# Patient Record
Sex: Female | Born: 1935 | Race: White | Hispanic: No | Smoking: Never smoker
Health system: Southern US, Community
[De-identification: ages and names within clinical notes are randomized; demographics above are authoritative.]

## PROBLEM LIST (undated history)

## (undated) DIAGNOSIS — L2089 Other atopic dermatitis: Secondary | ICD-10-CM

## (undated) DIAGNOSIS — R269 Unspecified abnormalities of gait and mobility: Secondary | ICD-10-CM

## (undated) DIAGNOSIS — E039 Hypothyroidism, unspecified: Secondary | ICD-10-CM

## (undated) DIAGNOSIS — R413 Other amnesia: Secondary | ICD-10-CM

## (undated) DIAGNOSIS — G253 Myoclonus: Secondary | ICD-10-CM

## (undated) DIAGNOSIS — S91009A Unspecified open wound, unspecified ankle, initial encounter: Secondary | ICD-10-CM

## (undated) DIAGNOSIS — N390 Urinary tract infection, site not specified: Secondary | ICD-10-CM

## (undated) DIAGNOSIS — E669 Obesity, unspecified: Secondary | ICD-10-CM

## (undated) DIAGNOSIS — Z9181 History of falling: Secondary | ICD-10-CM

## (undated) DIAGNOSIS — R32 Unspecified urinary incontinence: Secondary | ICD-10-CM

## (undated) DIAGNOSIS — K5900 Constipation, unspecified: Secondary | ICD-10-CM

## (undated) DIAGNOSIS — I8310 Varicose veins of unspecified lower extremity with inflammation: Secondary | ICD-10-CM

## (undated) DIAGNOSIS — S329XXA Fracture of unspecified parts of lumbosacral spine and pelvis, initial encounter for closed fracture: Secondary | ICD-10-CM

## (undated) DIAGNOSIS — E875 Hyperkalemia: Secondary | ICD-10-CM

## (undated) DIAGNOSIS — G608 Other hereditary and idiopathic neuropathies: Secondary | ICD-10-CM

## (undated) DIAGNOSIS — I10 Essential (primary) hypertension: Secondary | ICD-10-CM

## (undated) DIAGNOSIS — S81809A Unspecified open wound, unspecified lower leg, initial encounter: Secondary | ICD-10-CM

## (undated) DIAGNOSIS — N189 Chronic kidney disease, unspecified: Secondary | ICD-10-CM

## (undated) DIAGNOSIS — F070 Personality change due to known physiological condition: Secondary | ICD-10-CM

## (undated) DIAGNOSIS — S23101A Dislocation of unspecified thoracic vertebra, initial encounter: Secondary | ICD-10-CM

## (undated) DIAGNOSIS — J209 Acute bronchitis, unspecified: Secondary | ICD-10-CM

## (undated) DIAGNOSIS — R319 Hematuria, unspecified: Secondary | ICD-10-CM

## (undated) DIAGNOSIS — M26629 Arthralgia of temporomandibular joint, unspecified side: Secondary | ICD-10-CM

## (undated) DIAGNOSIS — J309 Allergic rhinitis, unspecified: Secondary | ICD-10-CM

## (undated) DIAGNOSIS — D649 Anemia, unspecified: Secondary | ICD-10-CM

## (undated) DIAGNOSIS — L919 Hypertrophic disorder of the skin, unspecified: Secondary | ICD-10-CM

## (undated) DIAGNOSIS — G5600 Carpal tunnel syndrome, unspecified upper limb: Secondary | ICD-10-CM

## (undated) DIAGNOSIS — L821 Other seborrheic keratosis: Secondary | ICD-10-CM

## (undated) DIAGNOSIS — I4891 Unspecified atrial fibrillation: Secondary | ICD-10-CM

## (undated) DIAGNOSIS — IMO0002 Reserved for concepts with insufficient information to code with codable children: Secondary | ICD-10-CM

## (undated) DIAGNOSIS — L299 Pruritus, unspecified: Secondary | ICD-10-CM

## (undated) DIAGNOSIS — H3530 Unspecified macular degeneration: Secondary | ICD-10-CM

## (undated) DIAGNOSIS — S32009A Unspecified fracture of unspecified lumbar vertebra, initial encounter for closed fracture: Secondary | ICD-10-CM

## (undated) DIAGNOSIS — S81009A Unspecified open wound, unspecified knee, initial encounter: Secondary | ICD-10-CM

## (undated) DIAGNOSIS — E785 Hyperlipidemia, unspecified: Secondary | ICD-10-CM

## (undated) DIAGNOSIS — G609 Hereditary and idiopathic neuropathy, unspecified: Secondary | ICD-10-CM

## (undated) DIAGNOSIS — G4700 Insomnia, unspecified: Secondary | ICD-10-CM

## (undated) DIAGNOSIS — E119 Type 2 diabetes mellitus without complications: Secondary | ICD-10-CM

## (undated) DIAGNOSIS — M419 Scoliosis, unspecified: Secondary | ICD-10-CM

## (undated) DIAGNOSIS — E1165 Type 2 diabetes mellitus with hyperglycemia: Secondary | ICD-10-CM

## (undated) DIAGNOSIS — R079 Chest pain, unspecified: Secondary | ICD-10-CM

## (undated) DIAGNOSIS — E559 Vitamin D deficiency, unspecified: Secondary | ICD-10-CM

## (undated) DIAGNOSIS — M129 Arthropathy, unspecified: Secondary | ICD-10-CM

## (undated) DIAGNOSIS — M6530 Trigger finger, unspecified finger: Secondary | ICD-10-CM

## (undated) DIAGNOSIS — H409 Unspecified glaucoma: Secondary | ICD-10-CM

## (undated) DIAGNOSIS — L909 Atrophic disorder of skin, unspecified: Secondary | ICD-10-CM

## (undated) DIAGNOSIS — M6200 Separation of muscle (nontraumatic), unspecified site: Secondary | ICD-10-CM

## (undated) DIAGNOSIS — L309 Dermatitis, unspecified: Secondary | ICD-10-CM

## (undated) DIAGNOSIS — Z9289 Personal history of other medical treatment: Secondary | ICD-10-CM

## (undated) HISTORY — DX: Acute bronchitis, unspecified: J20.9

## (undated) HISTORY — PX: RIGHT OOPHORECTOMY: SHX2359

## (undated) HISTORY — DX: Other disorders of calcium metabolism: E83.59

## (undated) HISTORY — PX: BIOPSY BREAST: PRO8

## (undated) HISTORY — DX: Unspecified urinary incontinence: R32

## (undated) HISTORY — DX: Carpal tunnel syndrome, unspecified upper limb: G56.00

## (undated) HISTORY — DX: Essential (primary) hypertension: I10

## (undated) HISTORY — DX: Vitamin D deficiency, unspecified: E55.9

## (undated) HISTORY — PX: LUMBAR SPINE SURGERY: SHX701

## (undated) HISTORY — DX: Personal history of other medical treatment: Z92.89

## (undated) HISTORY — DX: Insomnia, unspecified: G47.00

## (undated) HISTORY — DX: Dislocation of unspecified thoracic vertebra, initial encounter: S23.101A

## (undated) HISTORY — DX: Myoclonus: G25.3

## (undated) HISTORY — DX: Other atopic dermatitis: L20.89

## (undated) HISTORY — DX: Hypothyroidism, unspecified: E03.9

## (undated) HISTORY — DX: Hyperlipidemia, unspecified: E78.5

## (undated) HISTORY — DX: Chronic kidney disease, unspecified: N18.9

## (undated) HISTORY — DX: Chest pain, unspecified: R07.9

## (undated) HISTORY — DX: Urinary tract infection, site not specified: N39.0

## (undated) HISTORY — DX: Fracture of unspecified parts of lumbosacral spine and pelvis, initial encounter for closed fracture: S32.9XXA

## (undated) HISTORY — PX: LASER LAPAROSCOPY: SHX1952

## (undated) HISTORY — DX: Scoliosis, unspecified: M41.9

## (undated) HISTORY — DX: Hereditary and idiopathic neuropathy, unspecified: G60.9

## (undated) HISTORY — DX: Reserved for concepts with insufficient information to code with codable children: IMO0002

## (undated) HISTORY — DX: Allergic rhinitis, unspecified: J30.9

## (undated) HISTORY — DX: Atrophic disorder of skin, unspecified: L90.9

## (undated) HISTORY — DX: Other seborrheic keratosis: L82.1

## (undated) HISTORY — DX: Type 2 diabetes mellitus with hyperglycemia: E11.65

## (undated) HISTORY — DX: Varicose veins of unspecified lower extremity with inflammation: I83.10

## (undated) HISTORY — DX: Trigger finger, unspecified finger: M65.30

## (undated) HISTORY — DX: Separation of muscle (nontraumatic), unspecified site: M62.00

## (undated) HISTORY — DX: Unspecified fracture of unspecified lumbar vertebra, initial encounter for closed fracture: S32.009A

## (undated) HISTORY — PX: KNEE ARTHROSCOPY: SUR90

## (undated) HISTORY — PX: CORONARY ARTERY BYPASS GRAFT: SHX141

## (undated) HISTORY — DX: Arthralgia of temporomandibular joint, unspecified side: M26.629

## (undated) HISTORY — DX: Hyperkalemia: E87.5

## (undated) HISTORY — DX: Unspecified glaucoma: H40.9

## (undated) HISTORY — DX: Pruritus, unspecified: L29.9

## (undated) HISTORY — DX: Dermatitis, unspecified: L30.9

## (undated) HISTORY — DX: Arthropathy, unspecified: M12.9

## (undated) HISTORY — DX: Hematuria, unspecified: R31.9

## (undated) HISTORY — DX: Unspecified open wound, unspecified knee, initial encounter: S81.009A

## (undated) HISTORY — DX: Unspecified atrial fibrillation: I48.91

## (undated) HISTORY — DX: Unspecified open wound, unspecified lower leg, initial encounter: S81.809A

## (undated) HISTORY — DX: Unspecified open wound, unspecified ankle, initial encounter: S91.009A

## (undated) HISTORY — DX: History of falling: Z91.81

## (undated) HISTORY — DX: Obesity, unspecified: E66.9

## (undated) HISTORY — DX: Constipation, unspecified: K59.00

## (undated) HISTORY — PX: ABDOMINAL HYSTERECTOMY: SHX81

## (undated) HISTORY — DX: Type 2 diabetes mellitus without complications: E11.9

## (undated) HISTORY — DX: Anemia, unspecified: D64.9

## (undated) HISTORY — DX: Other amnesia: R41.3

## (undated) HISTORY — DX: Unspecified abnormalities of gait and mobility: R26.9

## (undated) HISTORY — DX: Hypertrophic disorder of the skin, unspecified: L91.9

## (undated) HISTORY — DX: Unspecified macular degeneration: H35.30

## (undated) HISTORY — DX: Other hereditary and idiopathic neuropathies: G60.8

## (undated) HISTORY — DX: Personality change due to known physiological condition: F07.0

---

## 1997-03-27 ENCOUNTER — Ambulatory Visit (HOSPITAL_COMMUNITY): Admission: RE | Admit: 1997-03-27 | Discharge: 1997-03-27 | Payer: Self-pay | Admitting: Internal Medicine

## 1997-05-25 ENCOUNTER — Encounter (HOSPITAL_COMMUNITY): Admission: RE | Admit: 1997-05-25 | Discharge: 1997-08-23 | Payer: Self-pay | Admitting: Cardiology

## 1997-07-26 ENCOUNTER — Encounter: Admission: RE | Admit: 1997-07-26 | Discharge: 1997-07-26 | Payer: Self-pay | Admitting: Internal Medicine

## 1998-05-04 ENCOUNTER — Ambulatory Visit (HOSPITAL_COMMUNITY): Admission: RE | Admit: 1998-05-04 | Discharge: 1998-05-04 | Payer: Self-pay | Admitting: Internal Medicine

## 1998-05-04 ENCOUNTER — Encounter: Payer: Self-pay | Admitting: Internal Medicine

## 1998-05-08 ENCOUNTER — Encounter: Payer: Self-pay | Admitting: Internal Medicine

## 1998-05-08 ENCOUNTER — Ambulatory Visit (HOSPITAL_COMMUNITY): Admission: RE | Admit: 1998-05-08 | Discharge: 1998-05-08 | Payer: Self-pay | Admitting: Internal Medicine

## 1998-05-17 ENCOUNTER — Encounter: Payer: Self-pay | Admitting: Internal Medicine

## 1998-05-17 ENCOUNTER — Ambulatory Visit (HOSPITAL_COMMUNITY): Admission: RE | Admit: 1998-05-17 | Discharge: 1998-05-17 | Payer: Self-pay | Admitting: Internal Medicine

## 1998-11-27 ENCOUNTER — Encounter: Payer: Self-pay | Admitting: Internal Medicine

## 1998-11-27 ENCOUNTER — Ambulatory Visit (HOSPITAL_COMMUNITY): Admission: RE | Admit: 1998-11-27 | Discharge: 1998-11-27 | Payer: Self-pay | Admitting: Internal Medicine

## 1998-12-10 ENCOUNTER — Encounter (HOSPITAL_COMMUNITY): Admission: RE | Admit: 1998-12-10 | Discharge: 1999-03-10 | Payer: Self-pay | Admitting: Cardiology

## 1999-03-11 ENCOUNTER — Encounter (HOSPITAL_COMMUNITY): Admission: RE | Admit: 1999-03-11 | Discharge: 1999-06-09 | Payer: Self-pay | Admitting: Cardiology

## 1999-06-10 ENCOUNTER — Encounter (HOSPITAL_COMMUNITY): Admission: RE | Admit: 1999-06-10 | Discharge: 1999-09-08 | Payer: Self-pay | Admitting: Cardiology

## 1999-12-13 ENCOUNTER — Encounter: Admission: RE | Admit: 1999-12-13 | Discharge: 2000-03-12 | Payer: Self-pay | Admitting: Family Medicine

## 1999-12-20 ENCOUNTER — Other Ambulatory Visit: Admission: RE | Admit: 1999-12-20 | Discharge: 1999-12-20 | Payer: Self-pay | Admitting: Internal Medicine

## 2000-02-13 ENCOUNTER — Ambulatory Visit (HOSPITAL_COMMUNITY): Admission: RE | Admit: 2000-02-13 | Discharge: 2000-02-13 | Payer: Self-pay | Admitting: Family Medicine

## 2000-02-13 ENCOUNTER — Encounter: Payer: Self-pay | Admitting: Family Medicine

## 2000-02-14 ENCOUNTER — Encounter: Admission: RE | Admit: 2000-02-14 | Discharge: 2000-02-14 | Payer: Self-pay | Admitting: Diagnostic Radiology

## 2000-02-14 ENCOUNTER — Encounter: Payer: Self-pay | Admitting: Diagnostic Radiology

## 2000-02-17 ENCOUNTER — Ambulatory Visit (HOSPITAL_COMMUNITY): Admission: RE | Admit: 2000-02-17 | Discharge: 2000-02-17 | Payer: Self-pay | Admitting: Family Medicine

## 2000-02-17 ENCOUNTER — Encounter: Payer: Self-pay | Admitting: Family Medicine

## 2000-03-12 ENCOUNTER — Encounter: Payer: Self-pay | Admitting: Family Medicine

## 2000-03-12 ENCOUNTER — Ambulatory Visit (HOSPITAL_COMMUNITY): Admission: RE | Admit: 2000-03-12 | Discharge: 2000-03-12 | Payer: Self-pay | Admitting: Family Medicine

## 2000-03-26 ENCOUNTER — Encounter: Payer: Self-pay | Admitting: Family Medicine

## 2000-03-26 ENCOUNTER — Ambulatory Visit (HOSPITAL_COMMUNITY): Admission: RE | Admit: 2000-03-26 | Discharge: 2000-03-26 | Payer: Self-pay | Admitting: Family Medicine

## 2000-04-08 ENCOUNTER — Ambulatory Visit (HOSPITAL_COMMUNITY): Admission: RE | Admit: 2000-04-08 | Discharge: 2000-04-08 | Payer: Self-pay | Admitting: Family Medicine

## 2000-04-08 ENCOUNTER — Encounter: Payer: Self-pay | Admitting: Family Medicine

## 2000-04-22 ENCOUNTER — Ambulatory Visit (HOSPITAL_COMMUNITY): Admission: RE | Admit: 2000-04-22 | Discharge: 2000-04-22 | Payer: Self-pay | Admitting: Internal Medicine

## 2000-04-22 ENCOUNTER — Encounter: Payer: Self-pay | Admitting: Internal Medicine

## 2000-04-28 ENCOUNTER — Encounter: Admission: RE | Admit: 2000-04-28 | Discharge: 2000-04-28 | Payer: Self-pay | Admitting: Internal Medicine

## 2000-04-28 ENCOUNTER — Encounter: Payer: Self-pay | Admitting: Internal Medicine

## 2000-06-03 ENCOUNTER — Encounter: Admission: RE | Admit: 2000-06-03 | Discharge: 2000-06-30 | Payer: Self-pay | Admitting: Family Medicine

## 2000-07-07 ENCOUNTER — Encounter: Payer: Self-pay | Admitting: Family Medicine

## 2000-07-07 ENCOUNTER — Ambulatory Visit (HOSPITAL_COMMUNITY): Admission: RE | Admit: 2000-07-07 | Discharge: 2000-07-07 | Payer: Self-pay | Admitting: Family Medicine

## 2000-07-07 ENCOUNTER — Encounter: Admission: RE | Admit: 2000-07-07 | Discharge: 2000-07-07 | Payer: Self-pay | Admitting: Family Medicine

## 2000-07-22 ENCOUNTER — Ambulatory Visit (HOSPITAL_COMMUNITY): Admission: RE | Admit: 2000-07-22 | Discharge: 2000-07-22 | Payer: Self-pay | Admitting: Family Medicine

## 2000-07-22 ENCOUNTER — Encounter: Payer: Self-pay | Admitting: Family Medicine

## 2000-07-22 ENCOUNTER — Encounter: Admission: RE | Admit: 2000-07-22 | Discharge: 2000-07-22 | Payer: Self-pay | Admitting: Family Medicine

## 2000-08-07 ENCOUNTER — Ambulatory Visit (HOSPITAL_COMMUNITY): Admission: RE | Admit: 2000-08-07 | Discharge: 2000-08-07 | Payer: Self-pay | Admitting: Family Medicine

## 2000-08-07 ENCOUNTER — Encounter: Admission: RE | Admit: 2000-08-07 | Discharge: 2000-08-07 | Payer: Self-pay | Admitting: Family Medicine

## 2000-08-07 ENCOUNTER — Encounter: Payer: Self-pay | Admitting: Family Medicine

## 2000-09-18 ENCOUNTER — Encounter: Admission: RE | Admit: 2000-09-18 | Discharge: 2000-09-18 | Payer: Self-pay | Admitting: Orthopedic Surgery

## 2000-09-18 ENCOUNTER — Encounter: Payer: Self-pay | Admitting: Orthopedic Surgery

## 2000-11-20 ENCOUNTER — Encounter: Admission: RE | Admit: 2000-11-20 | Discharge: 2000-11-20 | Payer: Self-pay | Admitting: *Deleted

## 2000-11-20 ENCOUNTER — Encounter: Payer: Self-pay | Admitting: *Deleted

## 2000-11-23 ENCOUNTER — Ambulatory Visit (HOSPITAL_BASED_OUTPATIENT_CLINIC_OR_DEPARTMENT_OTHER): Admission: RE | Admit: 2000-11-23 | Discharge: 2000-11-23 | Payer: Self-pay | Admitting: Orthopedic Surgery

## 2001-03-11 ENCOUNTER — Encounter: Payer: Self-pay | Admitting: Neurosurgery

## 2001-03-15 ENCOUNTER — Inpatient Hospital Stay (HOSPITAL_COMMUNITY): Admission: RE | Admit: 2001-03-15 | Discharge: 2001-03-17 | Payer: Self-pay | Admitting: Neurosurgery

## 2001-03-15 ENCOUNTER — Encounter: Payer: Self-pay | Admitting: Neurosurgery

## 2001-06-01 ENCOUNTER — Ambulatory Visit (HOSPITAL_COMMUNITY): Admission: RE | Admit: 2001-06-01 | Discharge: 2001-06-01 | Payer: Self-pay | Admitting: Internal Medicine

## 2001-06-01 ENCOUNTER — Encounter: Payer: Self-pay | Admitting: Internal Medicine

## 2001-06-07 ENCOUNTER — Encounter: Admission: RE | Admit: 2001-06-07 | Discharge: 2001-08-04 | Payer: Self-pay | Admitting: Neurosurgery

## 2002-06-08 ENCOUNTER — Encounter: Payer: Self-pay | Admitting: Internal Medicine

## 2002-06-08 ENCOUNTER — Ambulatory Visit (HOSPITAL_COMMUNITY): Admission: RE | Admit: 2002-06-08 | Discharge: 2002-06-08 | Payer: Self-pay | Admitting: Internal Medicine

## 2003-06-06 ENCOUNTER — Ambulatory Visit (HOSPITAL_COMMUNITY): Admission: RE | Admit: 2003-06-06 | Discharge: 2003-06-06 | Payer: Self-pay | Admitting: Internal Medicine

## 2003-12-05 ENCOUNTER — Encounter: Admission: RE | Admit: 2003-12-05 | Discharge: 2003-12-05 | Payer: Self-pay | Admitting: Orthopedic Surgery

## 2003-12-06 ENCOUNTER — Encounter (INDEPENDENT_AMBULATORY_CARE_PROVIDER_SITE_OTHER): Payer: Self-pay | Admitting: Specialist

## 2003-12-06 ENCOUNTER — Ambulatory Visit (HOSPITAL_BASED_OUTPATIENT_CLINIC_OR_DEPARTMENT_OTHER): Admission: RE | Admit: 2003-12-06 | Discharge: 2003-12-06 | Payer: Self-pay | Admitting: Orthopedic Surgery

## 2003-12-06 ENCOUNTER — Ambulatory Visit (HOSPITAL_COMMUNITY): Admission: RE | Admit: 2003-12-06 | Discharge: 2003-12-06 | Payer: Self-pay | Admitting: Orthopedic Surgery

## 2004-06-10 ENCOUNTER — Ambulatory Visit (HOSPITAL_COMMUNITY): Admission: RE | Admit: 2004-06-10 | Discharge: 2004-06-10 | Payer: Self-pay | Admitting: Internal Medicine

## 2004-06-19 ENCOUNTER — Ambulatory Visit (HOSPITAL_COMMUNITY): Admission: RE | Admit: 2004-06-19 | Discharge: 2004-06-19 | Payer: Self-pay | Admitting: Orthopedic Surgery

## 2004-06-19 LAB — HM DEXA SCAN

## 2005-06-16 ENCOUNTER — Encounter: Admission: RE | Admit: 2005-06-16 | Discharge: 2005-06-16 | Payer: Self-pay | Admitting: Neurology

## 2005-06-17 ENCOUNTER — Ambulatory Visit (HOSPITAL_COMMUNITY): Admission: RE | Admit: 2005-06-17 | Discharge: 2005-06-17 | Payer: Self-pay | Admitting: Internal Medicine

## 2006-06-24 ENCOUNTER — Ambulatory Visit (HOSPITAL_COMMUNITY): Admission: RE | Admit: 2006-06-24 | Discharge: 2006-06-24 | Payer: Self-pay | Admitting: Internal Medicine

## 2007-06-28 ENCOUNTER — Ambulatory Visit (HOSPITAL_COMMUNITY): Admission: RE | Admit: 2007-06-28 | Discharge: 2007-06-28 | Payer: Self-pay | Admitting: Internal Medicine

## 2007-11-08 ENCOUNTER — Inpatient Hospital Stay (HOSPITAL_COMMUNITY): Admission: EM | Admit: 2007-11-08 | Discharge: 2007-11-16 | Payer: Self-pay | Admitting: Emergency Medicine

## 2008-07-03 ENCOUNTER — Ambulatory Visit (HOSPITAL_COMMUNITY): Admission: RE | Admit: 2008-07-03 | Discharge: 2008-07-03 | Payer: Self-pay | Admitting: Internal Medicine

## 2008-07-03 LAB — HM MAMMOGRAPHY: HM Mammogram: NEGATIVE

## 2008-12-12 ENCOUNTER — Ambulatory Visit (HOSPITAL_BASED_OUTPATIENT_CLINIC_OR_DEPARTMENT_OTHER): Admission: RE | Admit: 2008-12-12 | Discharge: 2008-12-12 | Payer: Self-pay | Admitting: Orthopedic Surgery

## 2009-01-14 ENCOUNTER — Inpatient Hospital Stay (HOSPITAL_COMMUNITY): Admission: EM | Admit: 2009-01-14 | Discharge: 2009-01-17 | Payer: Self-pay | Admitting: Emergency Medicine

## 2009-03-26 ENCOUNTER — Encounter: Admission: RE | Admit: 2009-03-26 | Discharge: 2009-05-11 | Payer: Self-pay | Admitting: Orthopedic Surgery

## 2009-06-25 IMAGING — CR DG ABDOMEN 1V
1 series · 1 of 1 positions shown · non-contrast
Comparison: 11/09/2007.

CLINICAL DATA: Fall.  Constipation.

ABDOMEN - 1 VIEW

[view not recorded]
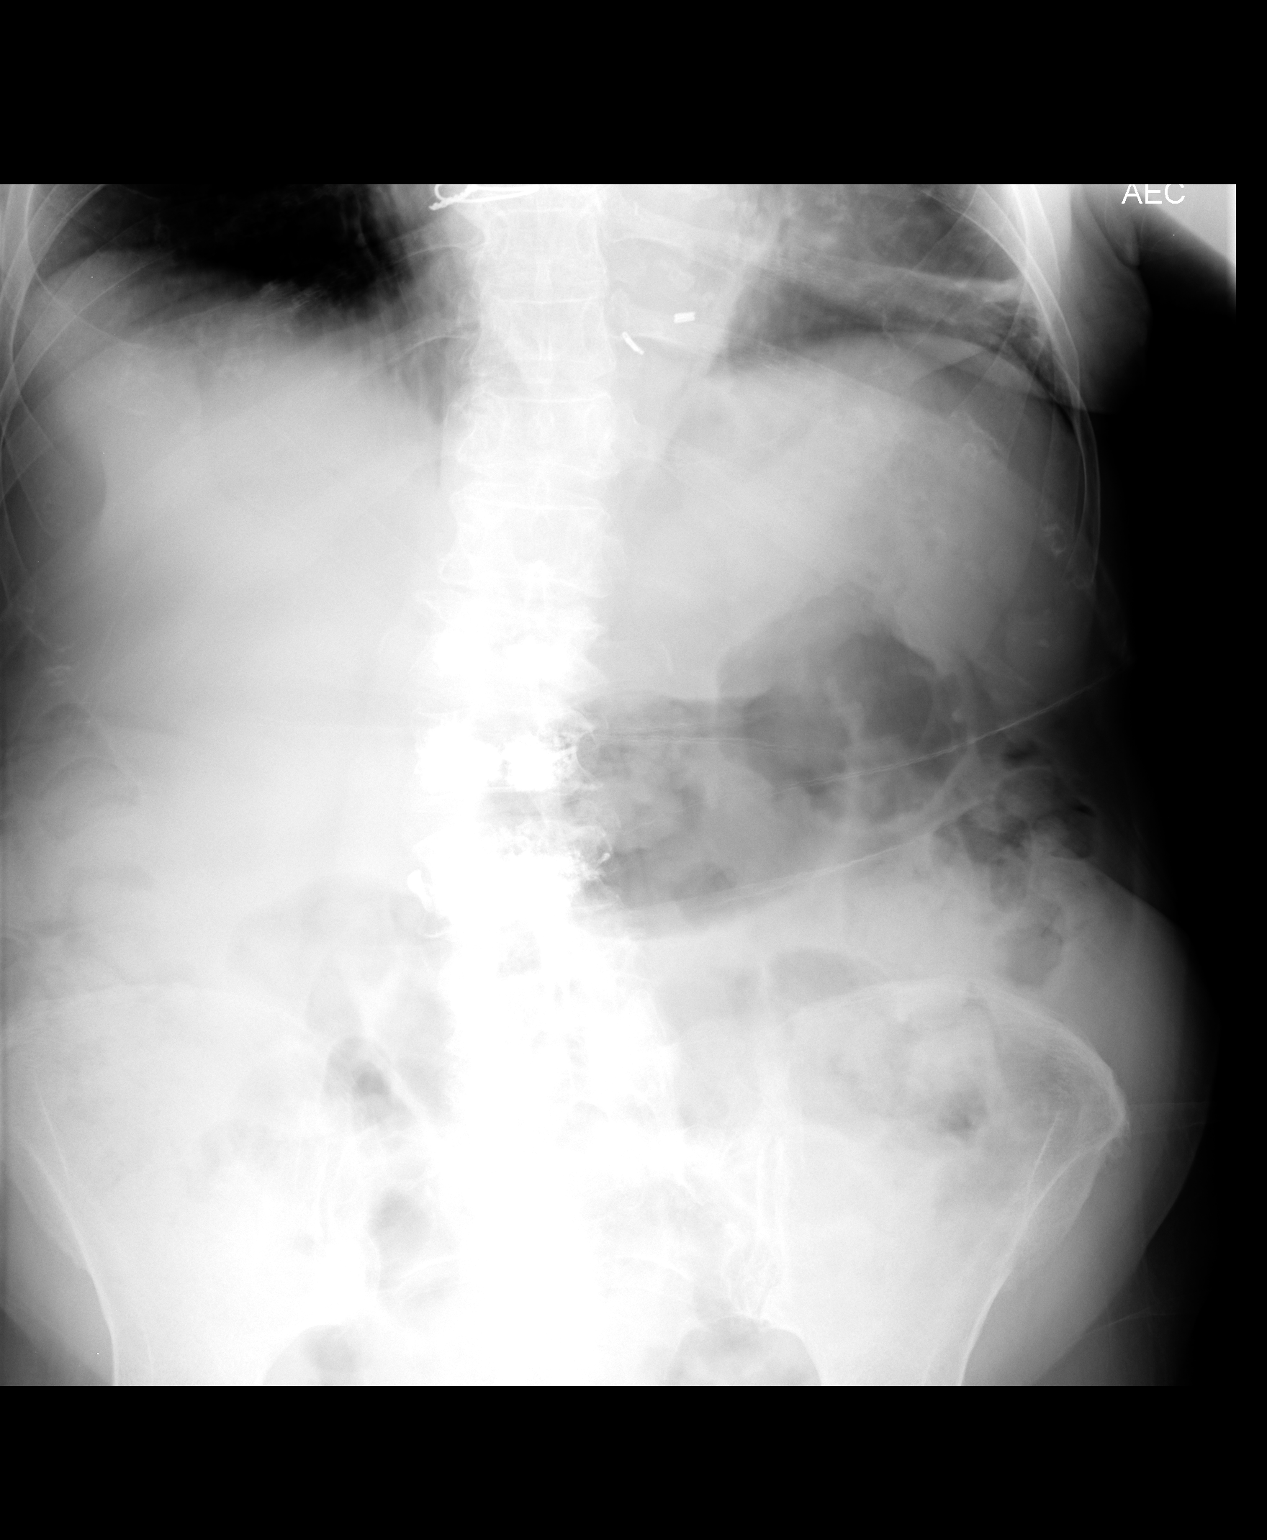

[1 of 1 positions shown; findings below may reference images not displayed]

FINDINGS: Prior vertebroplasties lumbar spine with loss of height
lower thoracic and lumbar spine.  Mild dextroscoliosis of the
lumbar spine appearing similar to that of the prior exam.

Plain film evaluation does not suggest an increased amount of stool
throughout the colon.  No plain film evidence of obstruction.  The
possibility of free air cannot be assessed on a supine view.
IMPRESSION: By plain film examination, there does not appear to be a
significant of stool throughout colon nor is there plain film
evidence of obstruction.

## 2009-07-02 ENCOUNTER — Ambulatory Visit (HOSPITAL_COMMUNITY): Admission: RE | Admit: 2009-07-02 | Discharge: 2009-07-02 | Payer: Self-pay | Admitting: Orthopedic Surgery

## 2009-07-04 ENCOUNTER — Encounter: Payer: Self-pay | Admitting: Orthopedic Surgery

## 2009-07-09 ENCOUNTER — Ambulatory Visit (HOSPITAL_COMMUNITY): Admission: RE | Admit: 2009-07-09 | Discharge: 2009-07-09 | Payer: Self-pay | Admitting: Interventional Radiology

## 2009-07-25 ENCOUNTER — Encounter: Payer: Self-pay | Admitting: Interventional Radiology

## 2009-08-17 ENCOUNTER — Ambulatory Visit (HOSPITAL_COMMUNITY): Admission: RE | Admit: 2009-08-17 | Discharge: 2009-08-17 | Payer: Self-pay | Admitting: Interventional Radiology

## 2009-08-24 ENCOUNTER — Encounter: Admission: RE | Admit: 2009-08-24 | Discharge: 2009-08-24 | Payer: Self-pay | Admitting: Internal Medicine

## 2009-08-28 ENCOUNTER — Encounter: Payer: Self-pay | Admitting: Interventional Radiology

## 2009-10-01 ENCOUNTER — Ambulatory Visit (HOSPITAL_COMMUNITY): Admission: RE | Admit: 2009-10-01 | Discharge: 2009-10-01 | Payer: Self-pay | Admitting: Interventional Radiology

## 2009-10-15 ENCOUNTER — Encounter: Payer: Self-pay | Admitting: Interventional Radiology

## 2010-03-17 ENCOUNTER — Encounter: Payer: Self-pay | Admitting: Internal Medicine

## 2010-03-17 ENCOUNTER — Encounter: Payer: Self-pay | Admitting: Interventional Radiology

## 2010-05-10 LAB — BASIC METABOLIC PANEL
CO2: 25 mEq/L (ref 19–32)
Chloride: 106 mEq/L (ref 96–112)
GFR calc Af Amer: >60 mL/min (ref >60)
Glucose, Bld: 130 mg/dL — ABNORMAL HIGH (ref 70–99)
Potassium: 4.7 mEq/L (ref 3.5–5.1)
Sodium: 141 mEq/L (ref 135–145)

## 2010-05-10 LAB — CBC
HCT: 38.2 % (ref 36.0–46.0)
Hemoglobin: 13.3 g/dL (ref 12.0–15.0)
MCH: 32.2 pg (ref 26.0–34.0)
MCHC: 34.8 g/dL (ref 30.0–36.0)
MCV: 92.5 fL (ref 78.0–100.0)
RBC: 4.13 MIL/uL (ref 3.87–5.11)

## 2010-05-10 LAB — PROTIME-INR: Prothrombin Time: 13 seconds (ref 11.6–15.2)

## 2010-05-10 LAB — GLUCOSE, CAPILLARY: Glucose-Capillary: 95 mg/dL (ref 70–99)

## 2010-05-13 LAB — GLUCOSE, CAPILLARY
Glucose-Capillary: 121 mg/dL — ABNORMAL HIGH (ref 70–99)
Glucose-Capillary: 89 mg/dL (ref 70–99)

## 2010-05-13 LAB — APTT: aPTT: 32 seconds (ref 24–37)

## 2010-05-13 LAB — BASIC METABOLIC PANEL
BUN: 19 mg/dL (ref 6–23)
GFR calc Af Amer: 59 mL/min — ABNORMAL LOW (ref >60)
GFR calc non Af Amer: 49 mL/min — ABNORMAL LOW (ref >60)
Potassium: 4.1 mEq/L (ref 3.5–5.1)

## 2010-05-13 LAB — PROTIME-INR: Prothrombin Time: 13.8 seconds (ref 11.6–15.2)

## 2010-05-13 LAB — CBC
HCT: 34.1 % — ABNORMAL LOW (ref 36.0–46.0)
Platelets: 292 10*3/uL (ref 150–400)
RBC: 3.58 MIL/uL — ABNORMAL LOW (ref 3.87–5.11)
WBC: 8.6 10*3/uL (ref 4.0–10.5)

## 2010-05-20 ENCOUNTER — Ambulatory Visit
Admission: RE | Admit: 2010-05-20 | Discharge: 2010-05-20 | Disposition: A | Discharge status: Home / Self Care | Payer: Medicare Other | Source: Ambulatory Visit | Attending: Internal Medicine | Admitting: Internal Medicine

## 2010-05-20 ENCOUNTER — Other Ambulatory Visit: Payer: Self-pay | Admitting: Internal Medicine

## 2010-05-29 LAB — CBC
Hemoglobin: 13.8 g/dL (ref 12.0–15.0)
MCHC: 34.5 g/dL (ref 30.0–36.0)
RBC: 4.19 MIL/uL (ref 3.87–5.11)
WBC: 12.5 10*3/uL — ABNORMAL HIGH (ref 4.0–10.5)

## 2010-05-29 LAB — BASIC METABOLIC PANEL
CO2: 25 mEq/L (ref 19–32)
Calcium: 11.1 mg/dL — ABNORMAL HIGH (ref 8.4–10.5)
Creatinine, Ser: 0.97 mg/dL (ref 0.4–1.2)
GFR calc Af Amer: >60 mL/min (ref >60)
GFR calc non Af Amer: 54 mL/min — ABNORMAL LOW (ref >60)
Sodium: 140 mEq/L (ref 135–145)

## 2010-05-29 LAB — DIFFERENTIAL
Lymphocytes Relative: 16 % (ref 12–46)
Lymphs Abs: 2 10*3/uL (ref 0.7–4.0)
Monocytes Absolute: 0.6 10*3/uL (ref 0.1–1.0)
Monocytes Relative: 5 % (ref 3–12)
Neutro Abs: 9.8 10*3/uL — ABNORMAL HIGH (ref 1.7–7.7)
Neutrophils Relative %: 79 % — ABNORMAL HIGH (ref 43–77)

## 2010-05-29 LAB — GLUCOSE, CAPILLARY
Glucose-Capillary: 146 mg/dL — ABNORMAL HIGH (ref 70–99)
Glucose-Capillary: 153 mg/dL — ABNORMAL HIGH (ref 70–99)
Glucose-Capillary: 178 mg/dL — ABNORMAL HIGH (ref 70–99)
Glucose-Capillary: 184 mg/dL — ABNORMAL HIGH (ref 70–99)
Glucose-Capillary: 185 mg/dL — ABNORMAL HIGH (ref 70–99)
Glucose-Capillary: 72 mg/dL (ref 70–99)

## 2010-05-29 LAB — URINALYSIS, ROUTINE W REFLEX MICROSCOPIC
Nitrite: NEGATIVE
Specific Gravity, Urine: 1.02 (ref 1.005–1.030)
Urobilinogen, UA: 0.2 mg/dL (ref 0.0–1.0)
pH: 5.5 (ref 5.0–8.0)

## 2010-05-29 LAB — PROTIME-INR
INR: 1.92 — ABNORMAL HIGH (ref 0.00–1.49)
INR: 2.63 — ABNORMAL HIGH (ref 0.00–1.49)
Prothrombin Time: 27.8 seconds — ABNORMAL HIGH (ref 11.6–15.2)
Prothrombin Time: 27.9 seconds — ABNORMAL HIGH (ref 11.6–15.2)

## 2010-05-29 LAB — APTT: aPTT: 41 seconds — ABNORMAL HIGH (ref 24–37)

## 2010-05-29 LAB — POCT CARDIAC MARKERS: Myoglobin, poc: 142 ng/mL (ref 12–200)

## 2010-05-30 LAB — POCT HEMOGLOBIN-HEMACUE: Hemoglobin: 13.6 g/dL (ref 12.0–15.0)

## 2010-05-30 LAB — GLUCOSE, CAPILLARY: Glucose-Capillary: 99 mg/dL (ref 70–99)

## 2010-05-31 LAB — BASIC METABOLIC PANEL
GFR calc Af Amer: >60 mL/min (ref >60)
GFR calc non Af Amer: 53 mL/min — ABNORMAL LOW (ref >60)
Potassium: 4.7 mEq/L (ref 3.5–5.1)
Sodium: 140 mEq/L (ref 135–145)

## 2010-06-06 DIAGNOSIS — Z9289 Personal history of other medical treatment: Secondary | ICD-10-CM

## 2010-06-06 HISTORY — DX: Personal history of other medical treatment: Z92.89

## 2010-07-09 NOTE — H&P (Signed)
[name], [name]               ACCOUNT NO.:  [id number]   MEDICAL RECORD NO.:  [id number]          PATIENT TYPE:  INP   LOCATION:  1606                         FACILITY:  [facility]   PHYSICIAN:  Lonia Blood, M.D.      DATE OF BIRTH:  [birth year]   DATE OF ADMISSION:  11/08/2007  DATE OF DISCHARGE:                              HISTORY & PHYSICAL   PRIMARY CARE PHYSICIAN:  Medical laboratory scientific officer.   PRESENTING COMPLAINT:  Fall and hip pain.   HISTORY OF PRESENT ILLNESS:  The patient is an 77 year old female with  history of coronary artery disease, diabetes and hypertension who  apparently fell at home and has sustained pain in her hip.  The patient  said that she tripped and fell, did not pass out.  Denied hitting her  head.  She went backward.  The patient described the pain as sharp on  the right side of her hip.  It is rated as 8/10.  She has found it  difficult to put weight on her right foot.  Pain is mainly exacerbated  by movement and relieved by rest.  Denied any fever.  No nausea,  vomiting or diaphoresis.  No swelling of any joint.   PAST MEDICAL HISTORY:  Significant for:  1. Coronary artery disease status post coronary artery bypass graft      about 10 years ago.  She has not had any chest pain or cardiac      problems since then.  2. She has diabetes type 2.  3. Hypertension.  4. Hypothyroidism.  5. Dyslipidemia.  6. History of diabetic neuropathy.  7. Osteoporosis.  8. Atrial fibrillation on Coumadin.  9. Degenerative disk disease status post some back surgery.  10.Status post ganglion removal on her right wrist in 2005.  11.History of right long finger tenosynovitis status post surgery by      Dr. Priscille Kluver.   ALLERGIES:  She is allergic to SULFA.   MEDICATIONS:  Include:  1. Fosamax weekly.  2. Glipizide 5 mg p.o. b.i.d.  3. Metformin 1000 mg b.i.d.  4. Lanoxin 0.125 mg daily.  5. Januvia daily.  6. Ultram 50 mg daily.  7. Synthroid 0.05 mcg daily.  8.  Vitamin E 200 mg daily.  9. Vitamin C 200 mg twice daily.  10.Multivitamin once daily.  11.Lisinopril 10 mg daily.  12.Coumadin currently at 8 mg.  13.Lipitor 20 mg daily.  14.Neurontin 300 mg daily.  15.Ambien 10 mg nightly.  16.Macrodantin as needed.   SOCIAL HISTORY:  The patient is a widow.  Lives in [place].  Not a  nursing home patient.  Denied any tobacco, alcohol or IV drug use.   FAMILY HISTORY:  Noncontributory due to the patient's age.   REVIEW OF SYSTEMS:  12-point review of systems is negative except per  HPI.   EXAMINATION:  Her temperature is 98.5, blood pressure 146/50, pulse 71,  respiratory rate 18, saturations 95% on room air.  GENERAL: The patient is pleasant, awake, alert, oriented, remarkably  sharp, in no acute distress.  HEENT: PERRL.  EOMI.  NECK:  Supple.  No JVD, no lymphadenopathy.  RESPIRATORY:  She has good air entry bilaterally.  No wheezes, no rales.  CARDIOVASCULAR SYSTEM.  The patient has S1, S2, no murmur.  ABDOMEN:  Soft, nontender, with positive bowel sounds.  EXTREMITIES: No edema, cyanosis or clubbing.  The patient has no obvious  scars from her CABG.  Otherwise, no significant finding.  She has pain  with abduction and adduction as well as any mild rotation of her right  lower extremity.   LABORATORIES:  Are currently pending.  She has a hip x-ray that showed  no evidence of right hip fracture, degenerative change of the pubis  symphysis.  CT of the lower extremity showed right superior and inferior  pubic rami fractures.  No occult  hip fracture.   ASSESSMENT:  Therefore, this is an 77 year old female status post fall  presenting with what appears to be nondisplaced pubic rami fractures.  Patient's fracture is not a surgical case.  As this is nondisplaced at  this point, we will admit her for further management.   PLAN:  1. Fall.  This obviously was a trip, not a syncopal episode.  The      patient denied any dizziness prior to  falling.  We will just      continue with physical therapy, occupational therapy, but will not      require any major workup for falls.  2. Nondisplaced pubic rami fractures.  We will admit the patient      primarily to observe her for pain and then pain control, physical      therapy, occupational therapy.  Discharge patient only when she is      able to put weight on it.  3. Coronary artery disease.  This is stable.  We will check serial      cardiac enzymes x1, mainly, but otherwise no further treatment.  We      will continue with her home medications.  4. Diabetes.  Again we will continue with home medication and sliding-      scale insulin.  5. Hypertension.  Blood pressure seems to be reasonable.  We will      continue with home therapy.  6. Hypothyroidism.  Again we will continue with her Synthroid.  7. Dyslipidemia.  We will check fasting lipid panel and continue with      Lipitor.  8. Neuropathy.  This is most likely another contributing factor to her      tripping and falling.  We will observe the patient closely in the      hospital.  9. Osteoporosis.  Again, the patient gets weekly Fosamax.  Hopefully      she will get it after leaving the hospital.  10.Atrial fibrillation.  She seemed to be on Coumadin.  I will      continue to dose her Coumadin.  We will check her PT/INR here.  At      this point we will be careful with internal bleeds.  The patient is      a full code.      Lonia Blood, M.D.  Electronically Signed     LG/MEDQ  D:  11/08/2007  T:  August 13, 202009  Job:  [id number]

## 2010-07-09 NOTE — Discharge Summary (Signed)
[name], [name]               ACCOUNT NO.:  [id number]   MEDICAL RECORD NO.:  [id number]          PATIENT TYPE:  INP   LOCATION:  1606                         [facility]AC[facility]   PHYSICIAN:  Ladell Pier, M.D.   DATE OF BIRTH:  08/21/1916   DATE OF ADMISSION:  11/08/2007  DATE OF DISCHARGE:  11/15/2007                               DISCHARGE SUMMARY   DISCHARGE DIAGNOSES:  1. Nondisplaced inferior and superior pubic ramus fracture on the      right, patient to follow up November 24, 2007, or somewhere there      about with Dr. Shelle Iron for repeat x-ray of the fracture.  In the      meantime per Dr. Ermelinda Das recommendation, physical therapy mobilized      as tolerated, utilization of walker with partial weightbearing on      the right, analgesics as needed.  2. Coronary artery disease, status post coronary artery bypass graft      10 years ago.  3. Type 2 diabetes.  4. Hypertension.  5. Hypothyroidism.  6. Dyslipidemia.  7. History of diabetic neuropathy.  8. Osteoporosis.  9. Atrial fibrillation on chronic Coumadin therapy.  10.Degenerative disk disease, status post back surgery.  11.Status post ganglion removal on the right wrist in 2005.  12.History of right long finger tenosynovitis, status post surgery by      Dr. Priscille Kluver.  13.Urinary tract infection with negative urine cultures.  Antibiotics      will be discontinued prior to discharge to [facility].  14.Acute renal insufficiency.  Creatinine is now back to baseline.  15.Subtherapeutic INR.  We will continue Lovenox until INR greater      than 1.5.   DISCHARGE MEDICATIONS:  1. Fosamax 70 mg weekly.  2. Glipizide 5 mg daily.  3. Metformin 1000 mg twice daily.  4. Lanoxin 0.125 mg daily.  5. Will hold Januvia for now until the patient's p.o. intake is      increased.  6. Synthroid 125 mcg daily.  7. Vitamin E 200 mg daily.  8. Vitamin C 250 mg twice daily.  9. Multivitamin daily.  10.Lisinopril 10 mg daily.  11.Coumadin 8 mg daily.  12.Neurontin 300 mg three times a day.  13.Ambien 10 mg at bedtime as needed.  14.MiraLAX 17 grams daily.  15.Oxycodone 5 mg q.4 h. as needed for pain.  16.Cipro 250 mg twice daily x3 days.  17.Lovenox 40 mg subcutaneous q.24 h. Until INR greater than 1.5.   FOLLOWUP APPOINTMENTS:  The patient is to follow up with Dr. Shelle Iron close  to November 24, 2007.   PROCEDURES:  None.   CONSULTANTS:  Orthopedics, Dr. Jene Every   HISTORY OF PRESENT ILLNESS:  The patient is an 77 year old female with  history of coronary artery disease, diabetes, hypertension.  She fell at  home and sustained pain in her hip.  The patient said she tripped and  fell, did not pass out, did not hit her head.  She went backwards.  The  patient described the pain as sharp in the right side of her hip.  Please see  admission note for remainder of history.   PAST MEDICAL HISTORY/FAMILY HISTORY/SOCIAL  HISTORY/MEDICATIONS/ALLERGIES/REVIEW OF SYSTEMS:  Per admission H and P.   PHYSICAL EXAMINATION:  VITAL SIGNS:  T. max 100.1, pulse of 65,  respirations 18, blood pressure 155/64, pulse oximetry 94% on room air.  CBG 280.  HEENT:  Head is normocephalic, atraumatic.  Pupils are reactive to  light.  Throat without erythema.  CARDIOVASCULAR:  Irregularly irregular.  ABDOMEN:  Positive bowel sounds.  EXTREMITIES:  There is 1+ edema on the right.   HOSPITAL COURSE:  1. Superior pubic rami fracture:  The patient was admitted to the      hospital.  Orthopedic was consulted, and instructions were given as      to patient receiving physical therapy.  The patient continued to      receive physical therapy throughout the time she was in the      hospital.  She will be discharged to [facility] for further      rehab.  2. Atrial fibrillation:  The patient's Coumadin was held when she was      in the hospital.  INR trended down.  Coumadin was restarted while      she was in the hospital; INR is  still subtherapeutic.  We will      continue her on Lovenox 40 mg subcutaneous q.24 h. until INR      greater than 1.5.  3. Diabetes:  Medications--most of her medications were held for her      diabetes during her hospitalization.  Blood sugar, however, is      trending up to the 280s.  The patient will be discharged on      metformin and Glucotrol, but the Januvia will be held, and her      blood sugars will be adjusted outpatient.  4. Hypertension:  Blood pressure has been with fair control throughout      her hospitalization.  We will continue her on the lisinopril; may      need to increase the dose if blood pressure is elevated.  Blood      pressure this morning was 129/60.  5. Coronary artery disease remained stable throughout her      hospitalization.  She had no complaints of chest pain.  6. Urinary tract infection:  The patient did have mild elevation in      her temperature, and UA showed small leukocyte esterase and 3-6      WBCs.  She was treated with Cipro; will continue Cipro for another      3 days with her having low-grade temperature.  The UA, however,      that was done on the November 14, 2007, showed 0-2 WBCs.  7. Acute renal failure:  She did have an episode where her creatinine      bumped up to 1.45.  With hydration, her creatinine is now back to      0.92, so it is safe for her to resume her metformin.  8. Atrial fibrillation:  She will continue with her Coumadin for her      atrial fibrillation.  Goal INR is 2 to 3.   DISCHARGE LABORATORIES:  Sodium 136, potassium 4.1, chloride 105, CO2 of  26, glucose 162, BUN 18, creatinine 0.92, calcium 10.1, PT 18.1, INR  1.4.  Abdominal x-ray:  There does not appear to be a significant  amount of stool throughout the colon nor does a plain film show evidence  of any obstruction.  Lumbar spine films:  Stable appearance of the  lumbar spine.  CT scan of the right hip showed superior and inferior  pubic rami fractures, no  occult hip fracture.      Ladell Pier, M.D.  Electronically Signed     NJ/MEDQ  D:  11/15/2007  T:  11/15/2007  Job:  [id number]

## 2010-07-09 NOTE — Consult Note (Signed)
[name], [name]               ACCOUNT NO.:  [id number]   MEDICAL RECORD NO.:  [id number]          PATIENT TYPE:  INP   LOCATION:  1606                         [facility]ACILITY:  [facility]   PHYSICIAN:  Jene Every, M.D.    DATE OF BIRTH:  [birth year]   DATE OF CONSULTATION:  DATE OF DISCHARGE:                                 CONSULTATION   CHIEF COMPLAINT:  Right hip pain.   HISTORY:  This is an 77 year old female who apparently had fallen on  November 08, 2007.  This was not a syncopal event.  She was admitted  here to the hospital and radiographs of the right hip were inconclusive.  She subsequently underwent a CT scan of her pelvis, which indicated  essentially a nondisplaced inferior and superior pubic ramus fracture.  There was no evidence of a hip fracture.  She lives at home with her  son.   She reports pain, however, she has been able to mobilize with physical  therapy.   Her past medical history is significant for hypertension,  hypothyroidism, neuropathy, osteoporosis, A fib, degenerative disk  disease.   Her medications are Coumadin, Fosamax, metformin, digoxin, Ultram,  Synthroid, vitamins, lisinopril, Lipitor, Gabapentin, Ambien,  nitrofurantoin.   ALLERGIES:  SULFA.   On examination I see a healthy elderly female in minimal distress.  Mood and affect are appropriate.  She has pain to palpation of the right pubic ramus.  No pain with  rolling of her hip or axial loading of her hip.  The abdomen is soft, nontender.  The pelvis is stable.  She is nontender to palpation of the sacroiliac  joint.  She is neurovascularly intact, without evidence of DVT.   Radiographs of the hip demonstrate no evidence of fracture.   CT scan of the pelvis demonstrates nondisplaced superior and inferior  pubic ramus fractures.   IMPRESSION:  1. Nondisplaced inferior and superior pubic ramus fractures on the      right.  2. Coumadin use.   PLAN:  I recommend physical therapy and  mobilize the patient as  tolerated, utilization of a walker with partial weightbearing on the  right lower extremity.  Analgesics as needed.  She will require an x-ray  in my office in 2  weeks for repeat evaluation.  Home health will probably be a help for  her at home.  She does live with her son.  This is a stable fracture and  should heal over the next 6-8 weeks.  If there is any change or increase  in her pain, or subsequent fall, we discussed repeat imaging.      Jene Every, M.D.  Electronically Signed     JB/MEDQ  D:  2020/09/1807  T:  11/10/2007  Job:  [id number]

## 2010-07-12 NOTE — H&P (Signed)
[facility]  Patient:    [name], [name] Visit Number: [id number] MRN: [id number]          Service Type: SUR Location: 3000 [id number] Attending Physician:  Barton Fanny Dictated by:   Hewitt Shorts, M.D. Admit Date:  03/15/2001 Discharge Date: 03/17/2001                           History and Physical  HISTORY OF PRESENT ILLNESS:  The patient is an 77 year old right handed white female who is evaluated for a right L5 lumbar radiculopathy. She has had a lengthy history over the past year and a half. Her difficulties began in September of 2001 when she fell and hurt her back, apparently suffering three compression fractures. She had an MRI of the lumbar spine in November of 2001 and underwent vertebroplasty in December of 2001. She subsequently underwent epidural steroid injections in January and February of 2002, and underwent [facility] in February and March of 2002, and therapy at [facility] from April 2002.  In May of 2002 she fell in an unpaved parking lot and has had difficulties with her left knee. She had an MRI of the left knee, she had a fracture of the 4th toe and a bad sprain of her right ankle. In May of 2002, she underwent a lumbar myelogram and post-myelogram CT scan at [facility] and she underwent nerve blocks in May and June with [facility].  The patient was seen in neurosurgical consultation by Ronaldo Miyamoto L. Franky Macho, M.D. In July of 2002, she had left knee pain, but it was felt not to be arising from the lumbar spine, although, degenerative changes of spondylosis were noted. He did fine weakness of the left lower extremity and she went for EMG and nerve conduction studies which showed nonspecific findings. In August of 2002 she underwent a left knee arthroscopy by Molly Maduro A. Thurston Hole, M.D. She says her left knee still hurts and really, she is no better after the arthroscopic knee surgery. She has  pain around the inferior aspect of the left knee extending into the left anterior leg, the dorsum of the left foot and extending to the left great toe. Notably though, this pain is not nearly as bad as the pain that she is experiencing now through her right lower extremity.  The patients greatest complaint is in fact that of right lower extremity pain and weakness. The right lower extremity pain began in August of 2002 shortly before her arthroscopic left knee surgery. At this point she complains of severe pain in the right buttock into her lateral right thigh, anterior right leg and dorsum of the right foot towards the right great toe. She describes some low back pain and some tingling in her right great toe. At this point she cannot walk without a walker because of pain and weakness. The weakness involves her right foot. She was using Ultram 50 mg typically 1 b.i.d. for pain.  Repeat EMG and nerve conduction studies and an MRI scan of the lumbar scan of the lumbar spine were done this fall; the MRI was done in November of 2002. EMG and nerve conduction studies also were done in November of 2002 and showed severe subacute to chronic right L5 radiculopathy and neurosurgical reevaluation was requested.  We reviewed her MRI scan which showed her old compression fractures and vertebroplasties at the L1, L2 and L3 levels,  there is spondylosis and degenerative disk disease at multiple levels with bilateral recess encroachment L4-5 worse than L3-4 and at L4-5 is worse on the right than the left side. There was no central canal stenosis.  The patient is admitted now for bilateral L3-4 and L4-5 lumbar laminotomies and foraminotomies.  PAST MEDICAL HISTORY:  Her past medical history is notable for a history of type 2 diabetes treated with an oral hypoglycemic agents. She has a history of heart disease having undergone a three-vessel CABG in 1998 by Ramon Dredge B. Tyrone Sage, M.D. She does not  describe any history of hypertension, myocardial infarction, cancer, stroke peptic ulcer disease or lung disease.  PAST SURGICAL HISTORY: 1. 1948 ovarian cyst removal on the right. 2. 1970 hysterectomy. 3. 1987 bladder tack. 4. 1998 three-vessel CABG by Ramon Dredge B. Tyrone Sage, M.D. 6. December 2001 L1, L2 and L3 vertebroplasties. 7. August 2002 left knee arthroscopy.  ALLERGIES:  SULFA.  CURRENT MEDICATIONS:  1. Glucophage 500 mg b.i.d.  2. Glucotrol XL 5 mg b.i.d.  3. Lanoxin 0.0125 mg q.d.  4. Macrodantin 100 mg q.o.d.  5. Coumadin 7 mg alternating with 8 mg each day.  6. Zocor 20 mg q.d.  7. Actos 30 mg q.d.  8. Ultram 50 mg up to q.i.d. but actually taking one b.i.d.  9. Vitamin C 250 mg two tablets per day. 10. Vitamin E 200 intl units q.d. 11. Centrum one per day. 12. Os-Cal with calcium and vitamin D 500 mg two per day. 13. Fosamax 70 mg q. week.  FAMILY HISTORY:  Her parents are passed on.  SOCIAL HISTORY:  The patient is retired. She is widowed. She does not smoke. She drinks alcoholic beverages socially. She denies history of substance abuse.  REVIEW OF SYSTEMS:  Notable for that as described in the history of present illness and past medical history. She does have swelling of the distal right lower extremity which has been present since her vein harvesting for her CABG. Her 14-point review of systems is otherwise unremarkable.  PHYSICAL EXAMINATION:  GENERAL:  The patient is a well-developed, well-nourished white female who arrives in a wheelchair and has had difficulty moving about because of pain and weakness involving the right lower extremity.  VITAL SIGNS:  Temperature 97.5, pulse 74, blood pressure 169/75, respiratory rate 16, height 5 foot 3 inches, weight 130 pounds.  LUNGS:  Clear to auscultation. She has symmetrical chest excursions.  CARDIAC:  Heart has a regular rate and rhythm, normal S1 and S2, there is no murmur.  ABDOMEN:  Soft and  nondistended, bowel sounds are present.  PULSES:  Irregular.   EXTREMITIES:  Examination shows significant and swelling in the right leg and foot as compared to the left leg and foot. It is the side that she had her vein harvesting for her CABG. There is no clubbing or cyanosis.  MUSCULOSKELETAL:  Exam shows no significant tenderness per patient in the lumbar region.  NEUROLOGIC:  Examination shows 5/5 strength in the iliopsoas and quadriceps bilaterally. The left dorsiflexion and plantar flexion of the extensor hallucis longus is 5/5. The right plantar flexion is 5/5 and the right extensor hallucis longus and dorsiflexion is 0/5. Sensory examination shows intact sensation to pin prick to the distal lower extremities bilaterally as well as in the legs and feet. Reflexes are 1-2 at the quadriceps, the left gastrocnemius is minimal, the right is trace. Toes are downgoing bilaterally. Gait is significantly impaired and she requires assistance to move from a wheelchair  to the examination table and back to the wheelchair.  IMPRESSION:  A patient with a long complex history dating back nearly a year and a half. These problems have worsened and evolved over time. She has significant medical problems including heart disease and diabetes and in fact, is on Coumadin prior to surgery.  Her greatest difficulty at this point is a severe right lumbar radiculopathy. She does have mild and less extensive left lumbar radiculopathy. It is felt that this due to lateral recess encroachment due to degenerative disk disease and spondylosis, although, one would not exclude the possibility that of a neuropathy or radiculopathy. Her exam shows profound weakness of the right extensor hallucis longus and dorsiflexors and in fact, she has a complete foot drop. It is felt that this certainly may well be due to her lateral recess encroachment, although again, we cannot rule out the possibility that  diabetic neuropathy and/or radiculopathy are contributing significantly to this.  PLAN:  The patient will be admitted for bilateral L3-4 and L4-5 lumbar laminotomies and foraminotomies. We discussed alternatives to surgery, the nature of the surgical procedure, typical length of surgery, hospitalization for recuperation, her limitations postoperatively and the risks of surgery including the risk of infection, bleeding, possibility of transfusion, the risk of nerve dysfunction, pain, weakness, numbness or paresthesias, the risk of instability of the spine and possible need for further surgery and anesthetic risk for myocardial infarction, stroke, coma and death.  We did have her fitted for a right AFO prior to hospitalization as well as for a lumbar corset which we will want her to wear following surgery. Dictated by:   Hewitt Shorts, M.D. Attending Physician:  Barton Fanny DD:  03/17/01 TD:  03/18/01 Job: [id number]

## 2010-07-12 NOTE — Op Note (Signed)
[place]. [facility]  Patient:    APPLE, DEARMAS Visit Number: [id number] MRN: [id number]          Service Type: SUR Location: 3000 [id number] Attending Physician:  Barton Fanny Proc. Date: 03/15/01 Admit Date:  03/15/2001                             Operative Report  PREOPERATIVE DIAGNOSIS:  Bilateral L3-4 and L4-5 lateral recess stenosis with radiculopathy.  POSTOPERATIVE DIAGNOSIS:  Bilateral L3-4 and L4-5 lateral recess stenosis with radiculopathy.  PROCEDURE PERFORMED:  Bilateral L3-4 and L4-5 lumbar laminotomies and foraminotomies.  SURGEON:  Hewitt Shorts, M.D.  ASSISTANT:  Payton Doughty, M.D.  ANESTHESIA:  General endotracheal.  INDICATION:  The patient is an 77 year old woman who presented with bilateral lumbar radiculopathies right worse than left who was found by MRI scan to have bilateral L3-4 and L4-5 lateral recess stenosis and wishes to proceed with bilateral L3-4 and L4-5 lumbar laminotomies and foraminotomies.  DESCRIPTION OF PROCEDURE:  The patient was brought to the operating room and placed under general endotracheal anesthesia.  The patient was turned to the prone position.  The lumbar region was prepped with Betadine solution and draped in a sterile fashion.  The midline was infiltrated with local anesthetic with epinephrine.  X-rays were taken and the L3-4 and L4-5 levels were identified.  A midline incision was made over the L3-4 and L4-5 levels and carried down to the subcutaneous tissue.  Bipolar cautery and electrocautery were used to maintain hemostasis.  Dissection was carried down to the lumbar fascia which was incised bilaterally in the paraspinal muscles to spinous process and lamina in a subperiosteal fashion.  The L3-4 and L4-5 interlaminar spaces were identified bilaterally.  An x-ray was taken to confirm the localization and then bilateral laminotomies and foraminotomies were performed at both L3-4  and L4-5 using black max drill and Kerrison punches.  The microscope was draped and brought into the field to provide additional magnification, illumination, and visualization, and there remainder of the procedure was performed using microdissection and microsurgical technique.  The ligamentum flavum was thickened at each level and was carefully removed, thereby decompressing the thecal sac and nerve roots.  The laminotomies were extended to the foraminotomy so as to decompress the L3, L4, and L5 nerve roots bilaterally, and in the end good decompression of the thecal sac and nerve roots were achieved.  The spinous process and interspinous ligament as well as the facet complexes were left intact bilaterally at each level.  The wound was irrigated with bacitracin solution and hemostasis established and confirmed.  Once the decompression was completed and hemostasis established, we proceeded with closure of the deep fascia with 0 Vicryl sutures, subcutaneous and subcuticular closed with interrupted 2-0 Vicryl sutures, and the skin was reapproximated with Dermabond.  The patient tolerated the procedure well.  The estimated blood loss was 50 cc.  Sponge and needle counts were correct. Following surgery, the patient was turned back to the supine position to be reversed, extubated, and transferred to the recovery room for further care. Attending Physician:  Barton Fanny DD:  03/15/01 TD:  03/16/01 Job: [id number]

## 2010-07-12 NOTE — Op Note (Signed)
[name], [name]               ACCOUNT NO.:  [id number]   MEDICAL RECORD NO.:  [id number]          PATIENT TYPE:  AMB   LOCATION:  DSC                          FACILITY:  [facility]   PHYSICIAN:  Artist Pais. Weingold, M.D.DATE OF BIRTH:  1916/12/14   DATE OF PROCEDURE:  12/06/2003  DATE OF DISCHARGE:                                 OPERATIVE REPORT   PREOPERATIVE DIAGNOSES:  1.  Right wrist dorsal ganglion cyst.  2.  Right long finger A1 __________ synovitis.   POSTOPERATIVE DIAGNOSES:  1.  Right wrist dorsal ganglion cyst.  2.  Right long finger A1 __________ synovitis.   PROCEDURES:  1.  Excisional biopsy mass dorsal aspect right wrist ulnar side.  2.  A1 pulley release right long finger.   SURGEON:  Artist Pais. Mina Marble, M.D.   ASSISTANT:  None.   ANESTHESIA:  Regional.   TOURNIQUET TIME:  35 minutes.   COMPLICATIONS:  None.   DRAINS:  None.   SPECIMENS:  One specimen sent.   DESCRIPTION OF PROCEDURE:  The patient was taken to the operating room after  the induction of adequate regional anesthetic.  Right upper extremity  prepped and draped in the usual sterile fashion.  Once this was done, a  transverse incision was made over a mass on the ulnar side of the wrist on  the right.  Incision was taken down through the skin and subcutaneous  tissues.  Sheath overlying the fifth and sixth dorsal compartments was  opened and a cystic mass was encountered.  This was dissected free and sent  for pathologic confirmation.  The wound was thoroughly irrigated.  Hemostasis was achieved with bipolar cautery.  This was closed with 5-0  nylon.  The hand was fully supinated, placed on the table in the supinated  position and oblique incision was made with the A1 pulley right long finger.  Incision was taken down through skin and subcutaneous tissues.  A1 pulley  was identified and  split with a #15 blade.  The FDS and FDB tendons were lysed of all  adhesions.  Once this was done, the  wound was irrigated and closed with 5-0  nylon as well. Sterile dressing with Xeroform, 4x4s and compression wrap was  applied.  The patient tolerated the procedure well and went to the recovery  room in stable condition.       MAW/MEDQ  D:  12/06/2003  T:  12/06/2003  Job:  [id number]

## 2010-07-12 NOTE — Discharge Summary (Signed)
[place]. [facility]  Patient:    [name], [name] Visit Number: [id number] MRN: [id number]          Service Type: SUR Location: 3000 [id number] Attending Physician:  Barton Fanny Dictated by:   Hewitt Shorts, M.D. Admit Date:  03/15/2001 Discharge Date: 03/17/2001                             Discharge Summary  HISTORY OF PRESENT ILLNESS:  The patient is an 77 year old woman who presented with a long and complex history.  Her most recent difficulties have been that of severe right lumbar radiculopathy with a complete right foot drop.  She has been found to have significant bilateral lateral recess encroachment of L4-5 worse than L3-4 with the encroachment at the L4-5 level worse on the right than the left side.  She does have a history, though, of diabetes and heart disease and certainly the possibility that diabetic radiculopathy or neuropathy are contributing to her difficulties cannot be ruled out.  In any event though, the patient is admitted for surgical decompression.  PHYSICAL EXAMINATION:  EXTREMITIES:  Edema of the distal right lower extremity but is otherwise unremarkable.  NEUROLOGIC:  The right extensor hallucis longus and dorsiflexor were 0/5.  The remainder of the lower extremity strength was 5/5.  HOSPITAL COURSE:  The patient was admitted and underwent bilateral L3-4 and L4-5 lumbar laminotomies and foraminotomies.  Postoperatively, she has done well.  We did consult physical therapy and occupational therapy and they have recommended home health physical therapy.  The family is arranging for a private duty CNA if they are not available to provide continuous assistance to the patient.  The patient herself has done well.  Her wounds are healing well. She is afebrile.  Her vital signs are stable.  She has been taught how to put on and take off her lumbar corset.  She is using her AFO and she has been instructed that if she  has difficulties with the AFO she should return to [facility] for adjustments to it.  Overall, I think she is doing well and she has had very good relief of the right lumbar radicular pain but she has had no recovery of the motor function and we expect, if it is going to recover, it is going to take 6-12 months to recover.  DISCHARGE DIAGNOSIS:  Lumbar radiculopathy, lumbar spondylosis, lumbar degenerative disk disease, and lateral recess stenosis.  DISCHARGE MEDICATIONS:  Discharge prescription was given for Percocet 1-2 tablets p.o. q.4-6h. p.r.n. pain 40 tablets and no refills.  She was also instructed to resume her Coumadin tomorrow.  She is to continue all of her other home medications as before hospitalization. Dictated by:   Hewitt Shorts, M.D. Attending Physician:  Barton Fanny DD:  03/17/01 TD:  03/18/01 Job: [id number]

## 2010-07-12 NOTE — Op Note (Signed)
[place]  Patient:    [name], [name] Visit Number: [id number] MRN: [id number]          Service Type: Attending:  Elana Alm. Thurston Hole, M.D. Dictated by:   Elana Alm Thurston Hole, M.D. Proc. Date: 11/23/00                             Operative Report  PREOPERATIVE DIAGNOSIS:  Left knee medial meniscal tear with degenerative joint disease.  POSTOPERATIVE DIAGNOSES: 1. Left knee medial and lateral meniscal tears. 2. Left knee condromalacia/degenerative joint disease.  PROCEDURES: 1. Left knee examination under anesthesia followed by arthroscopic partial    medial and lateral meniscectomies. 2. Left knee chondroplasty.  SURGEON:  Elana Alm. Thurston Hole, M.D.  ASSISTANT:  Julien Girt, P.A.  ANESTHESIA:  Local and MAC.  OPERATIVE TIME:  30 minutes.  COMPLICATIONS:  None.  INDICATIONS FOR PROCEDURE:  [name] is an 77 year old woman who has had significant left knee pain for the past three to four months, increasing in nature with signs and symptoms consistent with a medial meniscus tear and DJD. She has failed conservative care and is now to undergo arthroscopy.  DESCRIPTION OF PROCEDURE:  [name] was brought to the operating room on November 23, 2000, after a block had been placed in the holding room.  She was placed on the operating table in the supine position.  Her left knee was examined under anesthesia.  Range of motion was 0-125 degrees with 1-2+ crepitation.  The knee was stable to ligamentous exam with normal patellar tracking.  The left leg was prepped using sterile Betadine and draped using sterile technique.  Originally through an inferolateral portal, the arthroscope with a pump attached.  Through an inferomedial portal, an arthroscopic probe was placed.  On initial inspection of the medial compartment, she had 50-75% grade 3 condromalacia, which was debrided.  The medial meniscus was probed and she had partial tearing of the  posterior medial horn, which was 30-40% resected back to a stable rim.  The intercondylar notch was inspected.  The anterior cruciate and posterior cruciate ligaments were normal.  The lateral compartment was inspected.  She had mild grade 1-2 condromalacia.  The lateral meniscus showed small tearing of 25% of the intersurface which was resected back to a stable rim.  The patellofemoral joint showed mild grade 1-3 condromalacia and 25-30% grade 3 changes, which was debrided.  The patella tracked normally.  Moderate synovitis in the medial and lateral gutters were debrided and the bleeders were cauterized.  Otherwise they were free of pathology.  FOLLOW-UP CARE:  She will be treated as an outpatient on Vicodin for pain.  I will see her back in the office in a week for sutures out and follow-up. Dictated by:   Elana Alm Thurston Hole, M.D. Attending:  Elana Alm. Thurston Hole, M.D. DD:  11/23/00 TD:  11/23/00 Job: [id number]

## 2010-10-01 ENCOUNTER — Ambulatory Visit
Admission: RE | Admit: 2010-10-01 | Discharge: 2010-10-01 | Disposition: A | Discharge status: Home / Self Care | Payer: Medicare Other | Source: Ambulatory Visit | Attending: Internal Medicine | Admitting: Internal Medicine

## 2010-10-01 ENCOUNTER — Other Ambulatory Visit: Payer: Self-pay | Admitting: Internal Medicine

## 2010-10-01 DIAGNOSIS — R05 Cough: Secondary | ICD-10-CM

## 2010-11-25 LAB — URINALYSIS, MICROSCOPIC ONLY
Glucose, UA: NEGATIVE
Hgb urine dipstick: NEGATIVE
Ketones, ur: NEGATIVE
Leukocytes, UA: NEGATIVE
Protein, ur: NEGATIVE
Urobilinogen, UA: 0.2

## 2010-11-25 LAB — PROTIME-INR
INR: 1.4
INR: 1.4
INR: 1.7 — ABNORMAL HIGH
INR: 3.1 — ABNORMAL HIGH
Prothrombin Time: 20.7 — ABNORMAL HIGH
Prothrombin Time: 34.4 — ABNORMAL HIGH

## 2010-11-25 LAB — URINALYSIS, ROUTINE W REFLEX MICROSCOPIC
Glucose, UA: NEGATIVE
Hgb urine dipstick: NEGATIVE
Ketones, ur: NEGATIVE
Ketones, ur: NEGATIVE
Nitrite: NEGATIVE
Protein, ur: NEGATIVE
Specific Gravity, Urine: 1.013
Urobilinogen, UA: 0.2
pH: 6

## 2010-11-25 LAB — CBC
HCT: 35.4 — ABNORMAL LOW
Hemoglobin: 12.1
MCHC: 34.3
MCV: 94.4
MCV: 94.8
Platelets: 188
Platelets: 239
RBC: 3.51 — ABNORMAL LOW
RBC: 3.75 — ABNORMAL LOW
RDW: 13.2
WBC: 8.7
WBC: 9.1

## 2010-11-25 LAB — BASIC METABOLIC PANEL
BUN: 23
CO2: 26
Calcium: 10.1
Chloride: 104
Creatinine, Ser: 1.04
GFR calc Af Amer: >60
GFR calc Af Amer: >60
GFR calc non Af Amer: 57 — ABNORMAL LOW
Glucose, Bld: 164 — ABNORMAL HIGH
Sodium: 136

## 2010-11-25 LAB — GLUCOSE, CAPILLARY
Glucose-Capillary: 132 — ABNORMAL HIGH
Glucose-Capillary: 135 — ABNORMAL HIGH
Glucose-Capillary: 135 — ABNORMAL HIGH
Glucose-Capillary: 146 — ABNORMAL HIGH
Glucose-Capillary: 160 — ABNORMAL HIGH
Glucose-Capillary: 161 — ABNORMAL HIGH
Glucose-Capillary: 175 — ABNORMAL HIGH
Glucose-Capillary: 178 — ABNORMAL HIGH
Glucose-Capillary: 187 — ABNORMAL HIGH
Glucose-Capillary: 191 — ABNORMAL HIGH
Glucose-Capillary: 199 — ABNORMAL HIGH
Glucose-Capillary: 233 — ABNORMAL HIGH
Glucose-Capillary: 257 — ABNORMAL HIGH
Glucose-Capillary: 279 — ABNORMAL HIGH

## 2010-11-25 LAB — COMPREHENSIVE METABOLIC PANEL
ALT: 21
AST: 19
Albumin: 2.9 — ABNORMAL LOW
Alkaline Phosphatase: 41
CO2: 28
Chloride: 103
GFR calc Af Amer: 41 — ABNORMAL LOW
GFR calc non Af Amer: 34 — ABNORMAL LOW
Potassium: 4.6
Total Bilirubin: 0.8

## 2010-11-25 LAB — URINE MICROSCOPIC-ADD ON

## 2010-11-25 LAB — URINE CULTURE
Colony Count: >=100000
Special Requests: NEGATIVE

## 2010-11-27 LAB — COMPREHENSIVE METABOLIC PANEL
ALT: 28
Albumin: 3.7
Alkaline Phosphatase: 43
BUN: 17
Chloride: 107
Potassium: 4.1
Total Bilirubin: 0.8

## 2010-11-27 LAB — CBC
HCT: 37.2
Hemoglobin: 12.7
MCHC: 34.1
RDW: 13

## 2010-11-27 LAB — DIFFERENTIAL
Basophils Absolute: 0
Basophils Relative: 0
Eosinophils Absolute: 0.1
Eosinophils Relative: 1
Monocytes Absolute: 0.8
Neutro Abs: 5.8

## 2010-11-27 LAB — PHOSPHORUS: Phosphorus: 2.3

## 2010-11-27 LAB — PROTIME-INR
INR: 3 — ABNORMAL HIGH
Prothrombin Time: 33.7 — ABNORMAL HIGH

## 2010-11-27 LAB — LIPID PANEL
Cholesterol: 143
HDL: 35 — ABNORMAL LOW
Triglycerides: 167 — ABNORMAL HIGH

## 2010-11-27 LAB — CK TOTAL AND CKMB (NOT AT ARMC): Relative Index: 2.2

## 2010-11-27 LAB — HEMOGLOBIN A1C: Mean Plasma Glucose: 148

## 2010-11-27 LAB — TSH: TSH: 3.46

## 2010-11-27 LAB — TROPONIN I: Troponin I: 0.01

## 2010-11-29 ENCOUNTER — Inpatient Hospital Stay (HOSPITAL_COMMUNITY)
Admission: EM | Admit: 2010-11-29 | Discharge: 2010-12-03 | DRG: 690 | Disposition: A | Discharge status: Home Health | Payer: Medicare Other | Attending: Internal Medicine | Admitting: Internal Medicine

## 2010-11-29 ENCOUNTER — Emergency Department (HOSPITAL_COMMUNITY): Payer: Medicare Other

## 2010-11-29 DIAGNOSIS — N390 Urinary tract infection, site not specified: Principal | ICD-10-CM | POA: Diagnosis present

## 2010-11-29 DIAGNOSIS — Z794 Long term (current) use of insulin: Secondary | ICD-10-CM

## 2010-11-29 DIAGNOSIS — B952 Enterococcus as the cause of diseases classified elsewhere: Secondary | ICD-10-CM | POA: Diagnosis present

## 2010-11-29 DIAGNOSIS — J4489 Other specified chronic obstructive pulmonary disease: Secondary | ICD-10-CM | POA: Diagnosis present

## 2010-11-29 DIAGNOSIS — Z951 Presence of aortocoronary bypass graft: Secondary | ICD-10-CM

## 2010-11-29 DIAGNOSIS — E860 Dehydration: Secondary | ICD-10-CM | POA: Diagnosis present

## 2010-11-29 DIAGNOSIS — S32509A Unspecified fracture of unspecified pubis, initial encounter for closed fracture: Secondary | ICD-10-CM | POA: Diagnosis present

## 2010-11-29 DIAGNOSIS — E1149 Type 2 diabetes mellitus with other diabetic neurological complication: Secondary | ICD-10-CM | POA: Diagnosis present

## 2010-11-29 DIAGNOSIS — E039 Hypothyroidism, unspecified: Secondary | ICD-10-CM | POA: Diagnosis present

## 2010-11-29 DIAGNOSIS — R5381 Other malaise: Secondary | ICD-10-CM | POA: Diagnosis present

## 2010-11-29 DIAGNOSIS — E1142 Type 2 diabetes mellitus with diabetic polyneuropathy: Secondary | ICD-10-CM | POA: Diagnosis present

## 2010-11-29 DIAGNOSIS — J449 Chronic obstructive pulmonary disease, unspecified: Secondary | ICD-10-CM | POA: Diagnosis present

## 2010-11-29 DIAGNOSIS — K5900 Constipation, unspecified: Secondary | ICD-10-CM | POA: Diagnosis present

## 2010-11-29 DIAGNOSIS — N179 Acute kidney failure, unspecified: Secondary | ICD-10-CM | POA: Diagnosis present

## 2010-11-29 DIAGNOSIS — I10 Essential (primary) hypertension: Secondary | ICD-10-CM | POA: Diagnosis present

## 2010-11-29 DIAGNOSIS — E785 Hyperlipidemia, unspecified: Secondary | ICD-10-CM | POA: Diagnosis present

## 2010-11-29 DIAGNOSIS — W19XXXA Unspecified fall, initial encounter: Secondary | ICD-10-CM | POA: Diagnosis present

## 2010-11-29 DIAGNOSIS — I2510 Atherosclerotic heart disease of native coronary artery without angina pectoris: Secondary | ICD-10-CM | POA: Diagnosis present

## 2010-11-29 DIAGNOSIS — I4891 Unspecified atrial fibrillation: Secondary | ICD-10-CM | POA: Diagnosis present

## 2010-11-29 DIAGNOSIS — Z66 Do not resuscitate: Secondary | ICD-10-CM | POA: Diagnosis present

## 2010-11-29 DIAGNOSIS — R5383 Other fatigue: Secondary | ICD-10-CM | POA: Diagnosis present

## 2010-11-29 LAB — DIFFERENTIAL
Basophils Absolute: 0 10*3/uL (ref 0.0–0.1)
Eosinophils Relative: 1 % (ref 0–5)
Lymphocytes Relative: 19 % (ref 12–46)
Lymphs Abs: 1.8 10*3/uL (ref 0.7–4.0)
Monocytes Absolute: 1 10*3/uL (ref 0.1–1.0)
Neutro Abs: 6.6 10*3/uL (ref 1.7–7.7)

## 2010-11-29 LAB — URINE MICROSCOPIC-ADD ON

## 2010-11-29 LAB — BASIC METABOLIC PANEL
CO2: 28 mEq/L (ref 19–32)
Chloride: 104 mEq/L (ref 96–112)
Glucose, Bld: 116 mg/dL — ABNORMAL HIGH (ref 70–99)
Sodium: 139 mEq/L (ref 135–145)

## 2010-11-29 LAB — POCT I-STAT TROPONIN I: Troponin i, poc: 0.02 ng/mL (ref 0.00–0.08)

## 2010-11-29 LAB — CBC
HCT: 34.8 % — ABNORMAL LOW (ref 36.0–46.0)
Hemoglobin: 11.9 g/dL — ABNORMAL LOW (ref 12.0–15.0)
MCV: 94.1 fL (ref 78.0–100.0)
RDW: 13 % (ref 11.5–15.5)
WBC: 9.5 10*3/uL (ref 4.0–10.5)

## 2010-11-29 LAB — URINALYSIS, ROUTINE W REFLEX MICROSCOPIC
Glucose, UA: NEGATIVE mg/dL
Hgb urine dipstick: NEGATIVE
Specific Gravity, Urine: 1.016 (ref 1.005–1.030)
pH: 5 (ref 5.0–8.0)

## 2010-11-29 LAB — PROTIME-INR: INR: 2.47 — ABNORMAL HIGH (ref 0.00–1.49)

## 2010-11-30 LAB — DIFFERENTIAL
Basophils Absolute: 0 10*3/uL (ref 0.0–0.1)
Basophils Relative: 0 % (ref 0–1)
Eosinophils Absolute: 0.3 10*3/uL (ref 0.0–0.7)
Eosinophils Relative: 3 % (ref 0–5)
Lymphs Abs: 2.3 10*3/uL (ref 0.7–4.0)
Neutrophils Relative %: 58 % (ref 43–77)

## 2010-11-30 LAB — BASIC METABOLIC PANEL
Calcium: 11 mg/dL — ABNORMAL HIGH (ref 8.4–10.5)
GFR calc non Af Amer: 62 mL/min — ABNORMAL LOW (ref >90)
Glucose, Bld: 48 mg/dL — ABNORMAL LOW (ref 70–99)
Sodium: 138 mEq/L (ref 135–145)

## 2010-11-30 LAB — HEMOGLOBIN A1C
Hgb A1c MFr Bld: 5.7 % — ABNORMAL HIGH (ref <5.7)
Mean Plasma Glucose: 117 mg/dL — ABNORMAL HIGH (ref <117)

## 2010-11-30 LAB — PROTIME-INR: Prothrombin Time: 24.4 seconds — ABNORMAL HIGH (ref 11.6–15.2)

## 2010-11-30 LAB — CK: Total CK: 496 U/L — ABNORMAL HIGH (ref 7–177)

## 2010-11-30 LAB — CBC
MCV: 95.4 fL (ref 78.0–100.0)
Platelets: 240 10*3/uL (ref 150–400)
RDW: 13.2 % (ref 11.5–15.5)
WBC: 9.2 10*3/uL (ref 4.0–10.5)

## 2010-11-30 LAB — MRSA PCR SCREENING: MRSA by PCR: NEGATIVE

## 2010-11-30 LAB — GLUCOSE, CAPILLARY
Glucose-Capillary: 93 mg/dL (ref 70–99)
Glucose-Capillary: 102 mg/dL — ABNORMAL HIGH (ref 70–99)
Glucose-Capillary: 200 mg/dL — ABNORMAL HIGH (ref 70–99)

## 2010-11-30 NOTE — H&P (Signed)
NAMEBEVERLIE, KURIHARA NO.:  [id number]  MEDICAL RECORD NO.:  [id number]  LOCATION:  1516                         FACILITY:  [facility]  PHYSICIAN:  Della Goo, M.D. DATE OF BIRTH:  1916/07/29  DATE OF ADMISSION:  11/29/2010 DATE OF DISCHARGE:                             HISTORY & PHYSICAL   DATE OF ADMISSION:  November 30, 2010  PRIMARY CARE PHYSICIAN:  Dr. Murray Hodgkins, [facility]  CHIEF COMPLAINT:  Weakness, fall.  HISTORY OF PRESENT ILLNESS:  This is a 77 year old female who was found on the floor when her son came to her home in the afternoon at about 2:30 p.m.  He had ascertained that she had been on the floor for approximately 20 hours.  He stated that he helped her up.  She was coherent and denied having any syncope or chest pain, dizziness or headache associated with the fall.  She walks normally with a walker, but he states that when he helped her up he noticed that her gait was unsteady and she had weakness in her arms and her legs.  He also stated when he found her, she had been lying on her side, so she had fallen on her side.  The patient was taken to the emergency department for further evaluation.  In the emergency department a CT scan of her head was performed along with x-rays of her pelvis, which revealed no acute fractures.  The patient had been found to have a remote right inferior pubic ramus fracture and her son reports that this is a 89-year-old fracture.  The patient was referred for medical admission.  PAST MEDICAL HISTORY:  Significant for COPD, coronary artery disease status post coronary artery bypass grafting, type 2 diabetes mellitus on insulin therapy, hyperlipidemia, hypertension, hypothyroidism, atrial fibrillation, osteopenia, previous fracture of the pelvis.  Status post vertebroplasty of the T-spine and lumbar spine.  The patient has urinary incontinence.  MEDICATIONS:  Her medications include amlodipine,  benazepril, Apidra SoloSTAR 10 units at lunch subcu, atorvastatin, Combivent, Flector, Fleet's enema, Fortical, Lantus insulin 30 units subcu q.a.m., Lyrica, magnesium citrate, metformin, Mucinex D, Myoflex, Neurontin, nitroglycerin sublingual p.r.n., oxycodone, Pradaxa, Sanctura.  ALLERGIES:  To SULFA.  SOCIAL HISTORY:  The patient lives alone.  She is a nonsmoker, nondrinker.  No history of illicit drug usage.  FAMILY HISTORY:  Noncontributory.  REVIEW OF SYSTEMS:  Pertinent as mentioned above.  PHYSICAL EXAMINATION:  GENERAL: This Is a 77 year old well-nourished, well-developed elderly Caucasian female who is in no acute distress. VITAL SIGNS: Temperature 99.1, blood pressure 134/58, heart rate 82, respirations 18, O2 sats 94%. HEENT: Normocephalic, atraumatic.  Pupils equal round,  reactive to light.  Extraocular movements are intact, funduscopic benign.  There is no scleral icterus.  Nares are patent bilaterally.  Oropharynx is clear. NECK: Supple, full range of motion.  No thyromegaly, adenopathy, jugular venous distention. CARDIOVASCULAR: Regular rate and rhythm.  No murmurs, gallops or rubs. LUNGS: Clear to auscultation bilaterally.  No rales, rhonchi or wheezes. ABDOMEN: Positive bowel sounds, soft, nontender, nondistended.  No hepatosplenomegaly. EXTREMITIES: Without cyanosis, clubbing or edema. NEUROLOGIC:  Examination nonfocal.  LABORATORY STUDIES:  White blood cell count 9.5, hemoglobin 11.9,  hematocrit 34.8, MCV 94.1, platelets 146, neutrophils 69%, lymphocytes 19%.  Protime 27.2 and INR 2.47.  Sodium 139, potassium 4.6, chloride 104, carbon dioxide 28, BUN 34, creatinine 0.99, glucose 116. Urinalysis negative except for moderate leukocyte esterase.  Urine microscopic, few epithelials, 11-20 white blood cells, 0 - 2 red blood cells, rare bacteria.  Chest x-ray reveals no acute disease findings, evidence of coronary artery bypass grafting surgery seen.  X-rays of  the pelvis reveal osteopenia and remote right inferior pubic ramus fracture seen.  CT scan of the head found to be negative for any acute disease findings, negative for acute hemorrhage, hydrocephalus, mass, lesion, extra-axial fluid collections; negative for evidence of infarction.  No displaced calvarial fractures.  ASSESSMENT:  This is a 77 year old female being admitted with 1. Weakness. 2. Fall. 3. Urinary tract infection. 4. Mild dehydration/acute renal failure. 5. Coagulopathy secondary to Pradaxa therapy. 6. Type 2 diabetes mellitus. 7. Hypertension. 8. Diabetic neuropathy. 9. Atrial fibrillation. 10.COPD.  PLAN:  The patient will be admitted.  She will be placed on IV fluids for general rehydration therapy.  A urine culture and sensitivity has been requested and the patient will be placed empirically on Rocephin therapy at this time to cover urinary tract infection.  The patient's Pradaxa therapy will be discontinued at this time and her PT and INR will be monitored.  SCDs have been ordered for DVT prophylaxis otherwise.  The patient's regular medications will be further verified. A physical therapy consultation will also be requested for further evaluation and plans for continued physical therapy beyond discharge. Code status has been discussed and the patient is a Do Not Resuscitate.     Della Goo, M.D.     HJ/MEDQ  D:  11/30/2010  T:  11/30/2010  Job:  [id number]  Electronically Signed by Della Goo M.D. on 11/30/2010 09:45:07 PM

## 2010-12-01 LAB — COMPREHENSIVE METABOLIC PANEL
AST: 29 U/L (ref 0–37)
Albumin: 3.5 g/dL (ref 3.5–5.2)
Alkaline Phosphatase: 60 U/L (ref 39–117)
BUN: 20 mg/dL (ref 6–23)
Potassium: 4.4 mEq/L (ref 3.5–5.1)
Sodium: 142 mEq/L (ref 135–145)
Total Protein: 6.8 g/dL (ref 6.0–8.3)

## 2010-12-01 LAB — CBC
HCT: 37.7 % (ref 36.0–46.0)
MCHC: 34 g/dL (ref 30.0–36.0)
Platelets: 241 10*3/uL (ref 150–400)
RDW: 12.8 % (ref 11.5–15.5)

## 2010-12-01 LAB — CK: Total CK: 381 U/L — ABNORMAL HIGH (ref 7–177)

## 2010-12-01 LAB — GLUCOSE, CAPILLARY
Glucose-Capillary: 230 mg/dL — ABNORMAL HIGH (ref 70–99)
Glucose-Capillary: 218 mg/dL — ABNORMAL HIGH (ref 70–99)
Glucose-Capillary: 221 mg/dL — ABNORMAL HIGH (ref 70–99)

## 2010-12-01 LAB — DIFFERENTIAL
Basophils Absolute: 0.1 10*3/uL (ref 0.0–0.1)
Basophils Relative: 1 % (ref 0–1)
Eosinophils Absolute: 0.4 10*3/uL (ref 0.0–0.7)
Eosinophils Relative: 5 % (ref 0–5)
Monocytes Absolute: 0.8 10*3/uL (ref 0.1–1.0)

## 2010-12-01 LAB — PROTIME-INR: INR: 1.25 (ref 0.00–1.49)

## 2010-12-02 LAB — DIFFERENTIAL
Eosinophils Relative: 6 % — ABNORMAL HIGH (ref 0–5)
Lymphocytes Relative: 34 % (ref 12–46)
Lymphs Abs: 2.6 10*3/uL (ref 0.7–4.0)
Monocytes Absolute: 0.8 10*3/uL (ref 0.1–1.0)

## 2010-12-02 LAB — CBC
HCT: 35.2 % — ABNORMAL LOW (ref 36.0–46.0)
MCHC: 33.8 g/dL (ref 30.0–36.0)
MCV: 93.6 fL (ref 78.0–100.0)
RDW: 12.7 % (ref 11.5–15.5)

## 2010-12-02 LAB — COMPREHENSIVE METABOLIC PANEL
ALT: 13 U/L (ref 0–35)
CO2: 27 mEq/L (ref 19–32)
Calcium: 11.1 mg/dL — ABNORMAL HIGH (ref 8.4–10.5)
Creatinine, Ser: 0.83 mg/dL (ref 0.50–1.10)
GFR calc Af Amer: 69 mL/min — ABNORMAL LOW (ref >90)
GFR calc non Af Amer: 59 mL/min — ABNORMAL LOW (ref >90)
Glucose, Bld: 143 mg/dL — ABNORMAL HIGH (ref 70–99)

## 2010-12-02 LAB — URINE CULTURE

## 2010-12-02 LAB — GLUCOSE, CAPILLARY
Glucose-Capillary: 132 mg/dL — ABNORMAL HIGH (ref 70–99)
Glucose-Capillary: 78 mg/dL (ref 70–99)

## 2010-12-02 NOTE — Discharge Summary (Signed)
NAMEHUGH, GARROW NO.:  [id number]  MEDICAL RECORD NO.:  [id number]  LOCATION:  1516                         FACILITY:  [facility]  PHYSICIAN:  Talmage Nap, MD  DATE OF BIRTH:  22-Jul-1916  DATE OF ADMISSION:  11/29/2010 DATE OF DISCHARGE:                        DISCHARGE SUMMARY - REFERRING   PRIMARY CARE PHYSICIAN:  Kimber Relic, MD; [facility].  DATE OF DISCHARGE:  To be determined by the rounding physician.  DISCHARGE DIAGNOSES: 1. Fall/myalgia. 2. Right inferior pubic rami fracture. 3. Enterococcus urinary tract infection. 4. Coronary artery disease, status post coronary artery bypass graft,     not decompensating. 5. History of atrial fibrillation. 6. Hypothyroidism. 7. Type 2 diabetes mellitus. 8. Hyperlipidemia. 9. History of urinary incontinence. 10.Deconditioning.  HISTORY:  The patient is a very pleasant 77 year old Caucasian female, who is admitted to the hospital on November 29, 2010, by Dr. Della Goo with history of fall and weakness, which was said to have occurred about 2:30 p.m. that day.  The patient was said to have been found on the floor by her son for approximately about 20 hours.  She ambulates with the help of a walker and she claimed that her knee is broken and subsequently fell on a side.  She denied any premonitory symptoms prior to the onset of fall.  Post fall, she was having extreme weakness in her legs and was subsequently brought to the emergency room to be evaluated.  MEDICATIONS:  Her preadmission medications include the following; 1. Vitamin E oral 200 mg 1 capsule p.o. q.h.s. 2. Vitamin D3 1000 units one p.o. b.i.d. 3. Vitamin C 250 mg one p.o. q.h.s. 4. Ultram (tramadol) 50 mg one p.o. q.4 h. p.r.n. 5. Tussionex extended-release suspension     (hydrocodone/chlorpheniramine) 1 teaspoonful q.12 h. p.r.n. 6. Levothyroxine 75 mcg one p.o. q.a.m. 7. Sanctura (trospium chloride) XR 60 mg 1  capsule daily. 8. Pradaxa (dabigatran) 150 mg p.o. b.i.d. 9. Neurontin (gabapentin) 300 mg 1 p.o. b.i.d. 10.Multivitamin one p.o. q.a.m. 11.Metformin 1000 mg one p.o. b.i.d. 12.Lyrica (pregabalin) 75 mg 1 capsule twice a day. 13.Lisinopril 10 mg 1 p.o. q.a.m. 14.Lantus (insulin glargine) 30 units subcutaneously q.a.m. 15.Latanoprost 0.005% ophthalmic both eyes 1 drop daily at bedtime. 16.Spiriva (tiotropium) 18 mcg one p.o. daily. 17.Zolpidem 10 mg half a tablet p.o. at bedtime p.r.n. 18.Nitroglycerin sublingual 1 tablet q.5 min. x3 doses p.r.n. 19.Myoflex 10% cream topical apply daily at bedtime p.r.n. 20.Fortical (calcitonin-salmon) recombinant DNA origin 200 units/ACT     solution 1 spray nasally q.a.m. 21.Flector (diclofenac epolamine) 1.2% topical transdermal q.12 h.     p.r.n. 22.Apidra (insulin glulisine) 10 units taking one daily before lunch. 23.Atorvastatin 200 mg 1 p.o. q.h.s. 24.Aleve (naproxen sodium) 220 mg one p.o. q.8 h. P.r.n. 25.Amlodipine 5 mg 1 p.o. q.a.m.  ALLERGIES:  Sulfa.  SOCIAL HISTORY:  The patient lives alone.  No history of alcohol or tobacco use.  FAMILY HISTORY AND REVIEW OF SYSTEMS:  Essentially documented in the initial history and physical.  At that time, the patient was seen by the admitting physician.  She was not in any distress.  PHYSICAL EXAMINATION:  VITAL SIGNS:  Temperature is 99.1, blood  pressure is 134/58, heart rate 82, respiratory rate 18, and saturating 94% on room air. HEENT:  Pupils were reactive to light and extraocular muscles are intact. NECK:  No jugular venous distention.  No carotid bruit.  No lymphadenopathy. CHEST:  Clear to auscultation. CARDIOVASCULAR:  Heart sounds are 1 and 2. ABDOMEN:  Soft and nontender.  Liver, spleen, kidney are not palpable. Bowel sounds are positive. EXTREMITIES:  No pedal edema. NEUROLOGIC:  Exam is nonfocal. MUSCULOSKELETAL SYSTEM:  Showed arthritic changes in the knees and  feet. NEUROPSYCHIATRIC:  Evaluation was unremarkable.  PERTINENT LABORATORY DATA:  Initial cardiac markers, troponin I of 0.02. Complete blood count with differential showed WBC of 9.5, hemoglobin 11.9, hematocrit 34.8, MCV of 94.1, and platelet count of 246 with normal differential.  Coagulation profile showed PT 27.2 and INR 2.47. Basic metabolic panel showed sodium of 139, potassium of 4.6, chloride of 104 with a bicarbonate of 28, glucose is 116, BUN is 34, and creatinine 0.99.  Urinalysis, moderate leukocyte esterase.  Urine microscopy, wbc 11 to 22, rare bacteria.  Routine MRSA screening negative.  Repeat coagulation profile done on November 30, 2010, showed PT 24.4 and INR 2.15.  Creatine kinase 496.  Hemoglobin A1c 5.7.  Repeat complete blood count with differential done on December 01, 2010, showed WBC of 7.6, hemoglobin of 12.8, hematocrit of 37.7, MCV of 92.9, and platelet count of 241 with normal differentials.  CPK level is 381. Magnesium level is 1.9.  Comprehensive metabolic panel showed sodium of 142, potassium of 4.4, chloride of 163, bicarbonate of 29, glucose is 142, BUN is 20, and creatinine is 0.78.  Coagulation profile showed PT 16.0 and INR 1.25.  Urine culture grew enterococcus species.  IMAGING STUDIES:  Done include chest x-ray, no active disease, status post CABG.  CT of the head without contrast, hypodensity, nonspecific, chronic microangiopathic changes.  X-ray of the pelvis showed remote right inferior pubic rami fracture.  HOSPITAL COURSE:  The patient was admitted to general medical floor with history of fall and UTI, started on normal saline IV to go at rate of 75 cc/hr.  Because of slight coagulopathy, Pradaxa was on hold and the patient was placed on SCDs boot for DVT prophylaxis.  She was given Zofran for nausea and Senokot for constipation.  Pain control was done with Tylenol, oxycodone, and Dilaudid.  The patient was placed on Accu- Cheks, insulin  sliding scale, and started on treatment for UTI with Rocephin 1 g IV q.24 h.  At the time the patient was evaluated by me, she did not have any specific complaint and she was restarted on almost all home medications; and since urine culture showed enterococcus species, IV Rocephin was discontinued and the patient was started on vancomycin 1 g IV stat and dosing to be done by pharmacy.  The patient will also have physical therapy done in this admission.  She will be followed and evaluated on daily basis and at the time of this discharge to rehab, medications will be dictated by the discharging physician.     Talmage Nap, MD     CN/MEDQ  D:  12/01/2010  T:  12/01/2010  Job:  [id number]  Electronically Signed by Talmage Nap  on 12/02/2010 06:58:26 AM

## 2010-12-03 LAB — COMPREHENSIVE METABOLIC PANEL
BUN: 18 mg/dL (ref 6–23)
CO2: 24 mEq/L (ref 19–32)
Calcium: 10.4 mg/dL (ref 8.4–10.5)
Creatinine, Ser: 0.72 mg/dL (ref 0.50–1.10)
GFR calc Af Amer: 84 mL/min — ABNORMAL LOW (ref >90)
GFR calc non Af Amer: 72 mL/min — ABNORMAL LOW (ref >90)
Glucose, Bld: 102 mg/dL — ABNORMAL HIGH (ref 70–99)
Total Protein: 6.3 g/dL (ref 6.0–8.3)

## 2010-12-03 LAB — GLUCOSE, CAPILLARY: Glucose-Capillary: 118 mg/dL — ABNORMAL HIGH (ref 70–99)

## 2010-12-03 LAB — CBC
HCT: 34.3 % — ABNORMAL LOW (ref 36.0–46.0)
MCH: 31.5 pg (ref 26.0–34.0)
MCHC: 34.1 g/dL (ref 30.0–36.0)
MCV: 92.5 fL (ref 78.0–100.0)
Platelets: 255 10*3/uL (ref 150–400)
RDW: 12.9 % (ref 11.5–15.5)
WBC: 6.5 10*3/uL (ref 4.0–10.5)

## 2010-12-03 LAB — DIFFERENTIAL
Eosinophils Absolute: 0.5 10*3/uL (ref 0.0–0.7)
Eosinophils Relative: 7 % — ABNORMAL HIGH (ref 0–5)
Lymphs Abs: 2.4 10*3/uL (ref 0.7–4.0)
Monocytes Absolute: 0.6 10*3/uL (ref 0.1–1.0)

## 2010-12-03 LAB — MAGNESIUM: Magnesium: 1.7 mg/dL (ref 1.5–2.5)

## 2010-12-03 NOTE — Discharge Summary (Signed)
  [name], [name] NO.:  [id number]  MEDICAL RECORD NO.:  [id number]  LOCATION:  1516                         FACILITY:  [facility]  PHYSICIAN:  Talmage Nap, MD  DATE OF BIRTH:  Dec 03, 1916  DATE OF ADMISSION:  11/29/2010 DATE OF DISCHARGE:  12/03/2010                        DISCHARGE SUMMARY - REFERRING   ADDENDUM:  For discharge diagnoses as well as extensive hospital course, refer to my discharge summary dictated on December 01, 2010.  The patient was however, seen by me today, December 03, 2010, feels better.  Denied any specific complaint.  Examination of the patient was essentially unremarkable.  Vital signs, blood pressure is 151/61, pulse 75, respiratory rate 16, and temperature is 98.7.  LABORATORY DATA:  Labs include complete blood count with differential showed WBCs of 6.5, hemoglobin 11.7, hematocrit of 34.3, MCV of 92.5, and platelet count of 255.  Coagulation profile showed PT 20.2 and INR 1.69.  Comprehensive metabolic panel showed sodium of 141, potassium of 3.8, chloride of 109 with a bicarbonate of 24, glucose is 102, BUN is 18, and creatinine 0.72.  The patient is medically stable.  Plan is for the patient to be discharged home today on home health PT with [place].  Diet include heart healthy as well as diabetic ADA diet.  Medications to be taken at home include the following: 1. Amoxicillin 500 mg 1 p.o. t.i.d. for 5 days. 2. Hydrocodone/chlorpheniramine suspension (Tussionex suspension) 5 mL     p.o. q.12 h. 3. Tramadol (Ultram) 50 mg 1 p.o. q.4 h. p.r.n. 4. Aleve (naproxen sodium) 220 mg 1 tablets p.o. q.8 h. p.r.n. 5. Amlodipine 5 mg 1 p.o. q.a.m. 6. Apidra (insulin glulisine) 10 units subcutaneously, to be taken 1     before lunch. 7. Atorvastatin 20 mg 1 p.o. q.h.s. 8. Flector (diclofenac epolamine 1.3% topical patch transdermal q.12     h. p.r.n. 9. Fortical (calcitonin-salmon) recombinant DNA origin 200 units/ACT     solution  1 spray q.a.m. 10.Lantus insulin 30 units subcutaneously q.a.m. 11.Latanoprost 0.005% ophthalmic 1 drop, both eyes daily at bedtime. 12.Levothyroxine 75 mcg 1 tablet p.o. q. a.m. 13.Lisinopril 1 tablet p.o. q.a.m. 14.Lyrica (pregabalin) 75 mg 1 p.o. b.i.d. 15.Metformin 1000 mg 1 p.o. b.i.d. before meals. 16.Multivitamins Centrum 1 tablet p.o. q.a.m. 17.Myoflex 10% cream apply topically as needed at bedtime p.r.n. 18.Neurontin (gabapentin) 300 mg 1 capsule p.o. b.i.d. 19.Nitroglycerin 1 tablet sublingual q.5 minutes x3 doses p.r.n. 20.Pradaxa (dabigatran) 150 mg 1 p.o. b.i.d. 21.Sanctura (trospium) XR 60 mg 1 p.o. q.a.m. 22.Spiriva (tiotropium) 18 mcg inhale daily. 23.Tussionex extended release oral suspension,     hydrocodone/chlorpheniramine 1 teaspoons q.4 h. p.r.n. 24.Ultram 50 mg 1 p.o. q.4 h. p.r.n. 25.Vitamin C 250 mg 1 p.o. q.a.m. 26.Vitamin D 1000 units 1 p.o. b.i.d. 27.Vitamin E 200 units 1 p.o. q.h.s. 28.Zolpidem 10 mg half a tablet p.o. daily at bedtime p.r.n.     Talmage Nap, MD     CN/MEDQ  D:  12/03/2010  T:  12/03/2010  Job:  [id number]  cc:   Lenon Curt Chilton Si, M.D. Fax: [phone]  Electronically Signed by Talmage Nap  on 12/03/2010 05:39:21 PM

## 2010-12-07 ENCOUNTER — Emergency Department (HOSPITAL_COMMUNITY): Payer: Medicare Other

## 2010-12-07 ENCOUNTER — Observation Stay (HOSPITAL_COMMUNITY)
Admission: EM | Admit: 2010-12-07 | Discharge: 2010-12-11 | Disposition: A | Discharge status: Skilled Nursing Facility | Payer: Medicare Other | Attending: Internal Medicine | Admitting: Internal Medicine

## 2010-12-07 DIAGNOSIS — M62838 Other muscle spasm: Secondary | ICD-10-CM | POA: Insufficient documentation

## 2010-12-07 DIAGNOSIS — Z79899 Other long term (current) drug therapy: Secondary | ICD-10-CM | POA: Insufficient documentation

## 2010-12-07 DIAGNOSIS — N183 Chronic kidney disease, stage 3 unspecified: Secondary | ICD-10-CM | POA: Insufficient documentation

## 2010-12-07 DIAGNOSIS — I2510 Atherosclerotic heart disease of native coronary artery without angina pectoris: Secondary | ICD-10-CM | POA: Insufficient documentation

## 2010-12-07 DIAGNOSIS — R059 Cough, unspecified: Secondary | ICD-10-CM | POA: Insufficient documentation

## 2010-12-07 DIAGNOSIS — E785 Hyperlipidemia, unspecified: Secondary | ICD-10-CM | POA: Insufficient documentation

## 2010-12-07 DIAGNOSIS — E039 Hypothyroidism, unspecified: Secondary | ICD-10-CM | POA: Insufficient documentation

## 2010-12-07 DIAGNOSIS — G319 Degenerative disease of nervous system, unspecified: Secondary | ICD-10-CM | POA: Insufficient documentation

## 2010-12-07 DIAGNOSIS — R29898 Other symptoms and signs involving the musculoskeletal system: Secondary | ICD-10-CM | POA: Insufficient documentation

## 2010-12-07 DIAGNOSIS — R4182 Altered mental status, unspecified: Secondary | ICD-10-CM | POA: Insufficient documentation

## 2010-12-07 DIAGNOSIS — J3489 Other specified disorders of nose and nasal sinuses: Secondary | ICD-10-CM | POA: Insufficient documentation

## 2010-12-07 DIAGNOSIS — G589 Mononeuropathy, unspecified: Secondary | ICD-10-CM | POA: Insufficient documentation

## 2010-12-07 DIAGNOSIS — R627 Adult failure to thrive: Secondary | ICD-10-CM | POA: Insufficient documentation

## 2010-12-07 DIAGNOSIS — R5381 Other malaise: Secondary | ICD-10-CM | POA: Insufficient documentation

## 2010-12-07 DIAGNOSIS — I129 Hypertensive chronic kidney disease with stage 1 through stage 4 chronic kidney disease, or unspecified chronic kidney disease: Secondary | ICD-10-CM | POA: Insufficient documentation

## 2010-12-07 DIAGNOSIS — E1169 Type 2 diabetes mellitus with other specified complication: Secondary | ICD-10-CM | POA: Insufficient documentation

## 2010-12-07 DIAGNOSIS — J4489 Other specified chronic obstructive pulmonary disease: Secondary | ICD-10-CM | POA: Insufficient documentation

## 2010-12-07 DIAGNOSIS — Z794 Long term (current) use of insulin: Secondary | ICD-10-CM | POA: Insufficient documentation

## 2010-12-07 DIAGNOSIS — Z951 Presence of aortocoronary bypass graft: Secondary | ICD-10-CM | POA: Insufficient documentation

## 2010-12-07 DIAGNOSIS — R05 Cough: Secondary | ICD-10-CM | POA: Insufficient documentation

## 2010-12-07 DIAGNOSIS — M899 Disorder of bone, unspecified: Secondary | ICD-10-CM | POA: Insufficient documentation

## 2010-12-07 DIAGNOSIS — G9340 Encephalopathy, unspecified: Secondary | ICD-10-CM | POA: Insufficient documentation

## 2010-12-07 DIAGNOSIS — T383X5A Adverse effect of insulin and oral hypoglycemic [antidiabetic] drugs, initial encounter: Secondary | ICD-10-CM | POA: Insufficient documentation

## 2010-12-07 DIAGNOSIS — J449 Chronic obstructive pulmonary disease, unspecified: Secondary | ICD-10-CM | POA: Insufficient documentation

## 2010-12-07 LAB — URINALYSIS, ROUTINE W REFLEX MICROSCOPIC
Bilirubin Urine: NEGATIVE
Glucose, UA: NEGATIVE mg/dL
Hgb urine dipstick: NEGATIVE
Protein, ur: NEGATIVE mg/dL
Urobilinogen, UA: 0.2 mg/dL (ref 0.0–1.0)

## 2010-12-07 LAB — CBC
Hemoglobin: 11.7 g/dL — ABNORMAL LOW (ref 12.0–15.0)
MCH: 31.7 pg (ref 26.0–34.0)
MCHC: 33.5 g/dL (ref 30.0–36.0)
MCV: 94.6 fL (ref 78.0–100.0)
RBC: 3.69 MIL/uL — ABNORMAL LOW (ref 3.87–5.11)

## 2010-12-07 LAB — DIFFERENTIAL
Basophils Relative: 1 % (ref 0–1)
Eosinophils Absolute: 0.7 10*3/uL (ref 0.0–0.7)
Lymphs Abs: 2 10*3/uL (ref 0.7–4.0)
Monocytes Absolute: 0.6 10*3/uL (ref 0.1–1.0)
Monocytes Relative: 8 % (ref 3–12)
Neutro Abs: 4.4 10*3/uL (ref 1.7–7.7)
Neutrophils Relative %: 57 % (ref 43–77)

## 2010-12-07 LAB — GLUCOSE, CAPILLARY
Glucose-Capillary: 41 mg/dL — CL (ref 70–99)
Glucose-Capillary: 119 mg/dL — ABNORMAL HIGH (ref 70–99)
Glucose-Capillary: 226 mg/dL — ABNORMAL HIGH (ref 70–99)

## 2010-12-07 LAB — MAGNESIUM: Magnesium: 1.6 mg/dL (ref 1.5–2.5)

## 2010-12-07 LAB — COMPREHENSIVE METABOLIC PANEL
CO2: 24 mEq/L (ref 19–32)
Calcium: 11.7 mg/dL — ABNORMAL HIGH (ref 8.4–10.5)
Creatinine, Ser: 0.91 mg/dL (ref 0.50–1.10)
GFR calc Af Amer: 62 mL/min — ABNORMAL LOW (ref >90)
GFR calc non Af Amer: 53 mL/min — ABNORMAL LOW (ref >90)
Glucose, Bld: 37 mg/dL — CL (ref 70–99)
Sodium: 139 mEq/L (ref 135–145)
Total Protein: 6.8 g/dL (ref 6.0–8.3)

## 2010-12-07 LAB — LACTIC ACID, PLASMA: Lactic Acid, Venous: 1.2 mmol/L (ref 0.5–2.2)

## 2010-12-08 ENCOUNTER — Observation Stay (HOSPITAL_COMMUNITY): Payer: Medicare Other

## 2010-12-08 LAB — GLUCOSE, CAPILLARY
Glucose-Capillary: 128 mg/dL — ABNORMAL HIGH (ref 70–99)
Glucose-Capillary: 256 mg/dL — ABNORMAL HIGH (ref 70–99)
Glucose-Capillary: 155 mg/dL — ABNORMAL HIGH (ref 70–99)
Glucose-Capillary: 224 mg/dL — ABNORMAL HIGH (ref 70–99)

## 2010-12-08 LAB — URINE CULTURE: Colony Count: NO GROWTH

## 2010-12-08 LAB — BASIC METABOLIC PANEL
BUN: 18 mg/dL (ref 6–23)
CO2: 25 mEq/L (ref 19–32)
Glucose, Bld: 207 mg/dL — ABNORMAL HIGH (ref 70–99)
Potassium: 4.5 mEq/L (ref 3.5–5.1)
Sodium: 136 mEq/L (ref 135–145)

## 2010-12-08 LAB — CBC
HCT: 35.6 % — ABNORMAL LOW (ref 36.0–46.0)
Hemoglobin: 12.1 g/dL (ref 12.0–15.0)
MCH: 31.4 pg (ref 26.0–34.0)
MCV: 92.5 fL (ref 78.0–100.0)
Platelets: 251 10*3/uL (ref 150–400)
RBC: 3.85 MIL/uL — ABNORMAL LOW (ref 3.87–5.11)
WBC: 7.8 10*3/uL (ref 4.0–10.5)

## 2010-12-08 LAB — CALCIUM, IONIZED: Calcium, Ion: 1.54 mmol/L — ABNORMAL HIGH (ref 1.12–1.32)

## 2010-12-09 LAB — COMPREHENSIVE METABOLIC PANEL
ALT: 11 U/L (ref 0–35)
AST: 16 U/L (ref 0–37)
CO2: 27 mEq/L (ref 19–32)
Calcium: 11 mg/dL — ABNORMAL HIGH (ref 8.4–10.5)
Potassium: 3.8 mEq/L (ref 3.5–5.1)
Sodium: 139 mEq/L (ref 135–145)
Total Protein: 6.3 g/dL (ref 6.0–8.3)

## 2010-12-09 LAB — GLUCOSE, CAPILLARY

## 2010-12-09 LAB — CBC
MCH: 31.5 pg (ref 26.0–34.0)
MCHC: 33.9 g/dL (ref 30.0–36.0)
Platelets: 253 10*3/uL (ref 150–400)
RBC: 3.65 MIL/uL — ABNORMAL LOW (ref 3.87–5.11)

## 2010-12-09 LAB — DIFFERENTIAL
Basophils Absolute: 0.1 10*3/uL (ref 0.0–0.1)
Basophils Relative: 1 % (ref 0–1)
Eosinophils Absolute: 0.5 10*3/uL (ref 0.0–0.7)
Monocytes Relative: 11 % (ref 3–12)
Neutrophils Relative %: 45 % (ref 43–77)

## 2010-12-09 NOTE — H&P (Signed)
[name], PIERATT NO.:  [id number]  MEDICAL RECORD NO.:  [id number]  LOCATION:  1337                         FACILITY:  [facility]  PHYSICIAN:  Hillery Aldo, M.D.   DATE OF BIRTH:  December 11, 1916  DATE OF ADMISSION:  12/07/2010 DATE OF DISCHARGE:                             HISTORY & PHYSICAL   PRIMARY CARE PHYSICIAN:  Lenon Curt. Chilton Si, M.D. with [facility].  CHIEF COMPLAINT:  Weakness.  HISTORY OF PRESENT ILLNESS:  The patient is a 77 year old female with multiple medical problems, who was recently admitted from October 6 through December 03, 2010, for evaluation after a fall.  She apparently suffered right inferior pubic rami fracture and was treated for an enterococcal urinary tract infection.  She was sent home with physical therapy and  antibiotics consisting of amoxicillin.  She did well for several days but, then on Thursday morning (2 days ago), the patient began to become increasingly weak and experienced muscle tremors.  She was disoriented with altered mental status and diminished coordination. Her son, [name], ultimately called EMS today to bring her in for evaluation due to ongoing weakness, lack of coordination, and disorientation.  Upon arrival, the patient's CBG was found to be 41. She subsequently was referred to the hospitalist service for further evaluation and treatment.  PAST MEDICAL HISTORY: 1. Neuropathy. 2. Chronic obstructive pulmonary disease. 3. Coronary artery disease status post CABG. 4. Type 2 diabetes. 5. Hyperlipidemia. 6. Hypertension. 7. Hypothyroidism. 8. History of atrial fibrillation. 9. Osteopenia. 10.Urinary incontinence.  PAST SURGICAL HISTORY: 1. Lumbar laminectomy. 2. Vertebroplasty of the thoracic and lumbar spine. 3. Left knee surgery. 4. CABG. 5. Excisional biopsy of a mass on the right wrist. 6. Ovarian cyst removal. 7. Hysterectomy. 8. Bladder tack. 9. Cataract surgery.  FAMILY HISTORY:   Patient's parents both died in their 66s from old age. Three sisters are deceased with natural causes in her 57s to 54s.  She has 3 healthy sons.  SOCIAL HISTORY:  The patient lives with her son.  She is widowed.  She does not smoke, drink alcohol, or use drugs.  She ambulates with a walker at baseline.  ALLERGIES:  SULFA.  CURRENT MEDICATIONS: 1. Amoxicillin 500 mg p.o. t.i.d. 2. Tussionex 5 cc p.o. q.12 hours. 3. Tramadol 50 mg p.o. q.4 hours p.r.n. pain. 4. Aleve 220 mg p.o. q.8 hours p.r.n. 5. Amlodipine 5 mg p.o. daily. 6. Apidra 10 units subcutaneously q. a.c. lunch. 7. Atorvastatin 20 mg p.o. q.h.s. 8. Flector 1.3% topical patch transdermally q.12 hours. 9. Fortical 1 spray in alternating nostrils daily. 10.Lantus 30 units subcutaneously q.a.m. 11.Latanoprost 0.05% ophthalmic 1 drop both eyes daily q.h.s. 12.Levothyroxine 75 mcg p.o. daily. 13.Lisinopril 10 mg p.o. daily. 14.Lyrica 75 mg p.o. b.i.d.(Currently on hold). 15.Metformin 1000 mg p.o. b.i.d. 16.Multivitamin Centrum 1 tablet daily. 17.Myoflex 10% cream topically p.r.n. at bedtime. 18.Neurontin 300 mg p.o. b.i.d. 19.Nitroglycerin 0.4 mg sublingual q.5 minutes x3 doses p.r.n. 20.Pradaxa 150 mg p.o. b.i.d. 21.Sanctura XR 60 mg p.o. daily. 22.Spiriva 18 mcg 1 capsule inhaled daily.  REVIEW OF SYSTEMS:  CONSTITUTIONAL:  Positive for generalized weakness. Appetite has been good.  No weight loss.  HEENT:  Patient wears  glasses and denies vision changes.  She denies dysphagia.  CARDIOVASCULAR:  No chest pain or dysrhythmia.  RESPIRATORY:  Positive for cough and hoarseness.  No shortness of breath.  GI:  No nausea, vomiting, diarrhea, melena, or hematochezia.  GU:  No dysuria.  MUSCULOSKELETAL: Generalized weakness, otherwise negative.  Comprehensive 14-point review of systems is otherwise unremarkable.  PHYSICAL EXAMINATION:  VITAL SIGNS: Temperature 99.2, pulse 82, respirations 16, blood pressure  145/74. GENERAL:  Well-developed, well-nourished female in no acute distress. HEENT:  Normocephalic, atraumatic.  PERRL. EOMI.  Oropharynx is clear. Tongue is midline, with a brown coating.  Slightly dry mucous membranes. Palate rises symmetrically. NECK:  Supple, no thyromegaly, positive left cervical anterior lymphadenopathy.  CHEST:  Lungs clear to auscultation bilaterally with good air movement. HEART:  Regular rate, rhythm.  No murmurs, rubs, or gallops. ABDOMEN:  Soft, nontender, nondistended.  Normoactive bowel sounds. EXTREMITIES:  Trace edema. SKIN:  There is a petechial rash to the lower extremities that the patient's son says is longstanding.  Otherwise, no other skin lesions. NEUROLOGIC:  The patient is alert and oriented x2.  Cranial nerves II- XII grossly intact.  Moves all extremities x4 with equal strength.  DATA REVIEW: 1. CT scan of the head is stable with atrophy and chronic small vessel     white matter disease.  No acute intracranial abnormality. 2. Chest x-ray shows no evidence of acute cardiopulmonary disease.  LABORATORY DATA:  Lactic acid is 1.2.  Sodium is 139, potassium 4.7, chloride 107, bicarb 24, BUN 28, creatinine 0.91, glucose 37, total bilirubin 0.2, alkaline phosphatase 65, AST 21, ALT 13, total protein 6.8, albumin 3.4, calcium 11.7.  White blood cell count is 7.8, hemoglobin 11.7, hematocrit 34.9, platelets 237.  Urinalysis shows a specific gravity of 1.011 with negative nitrites and negative leukocytes.  ASSESSMENT AND PLAN: 1. Weakness with failure to thrive:  The patient's weakness and     failure to thrive is likely due to underlying deconditioning with     hypoglycemia playing an important role.  Additionally, the     patient's lack of coordination is concerning for a possible     cerebrovascular event.  We will bring the patient to the hospital     for observation, initiate an evaluation with MRI/MRA of the brain,     PT and OT  evaluations, and glucose monitoring. 2. Hypoglycemia:  We will hold all of the patient's oral hypoglycemics     and long-acting insulins.  We will place her on a normal saline     drip with dextrose at 50 cc an hour until her glucose stabilizes.     We use sliding scale insulin only, the sensitive scale, if needed. 3. Stage 3 chronic kidney disease.  The patient's baseline creatinine     is 0.83.  Her current creatinine is 0.91 with an estimated GFR of     53.  We will monitor her renal function closely. 4. Hypercalcemia:  Unclear etiology.  May be due to underlying     dehydration.  Would check vitamin D studies, intact PTH, continue     her calcitonin, and consider pamidronate if her calcium does not     normalize with rehydration.  Her calcium was normal at 10.4 on     December 03, 2010. 5. Neuropathy:  Continue patient's gabapentin but hold the Lyrica for     now. 6. COPD:  Continue the patient's bronchodilator therapy. 7. Cough:  Likely due to bronchitis.  Her chest x-ray is clear.  We     will provide symptomatic relief with Delsym. 8. Hypothyroidism:  Continue the patient's current dose of Synthroid     and check her thyroid function to ensure that she is appropriately     replaced given her generalized weakness. 9. Hypertension:  Monitor the patient's blood pressure closely and     adjust her medicines if needed. 10.Dyslipidemia:  Continue patient's atorvastatin. 11.Prophylaxis:  We will initiate Lovenox for DVT prophylaxis.  Time spent on admission including face-to-face time equals approximately 1 hour.     Hillery Aldo, M.D.     CR/MEDQ  D:  12/07/2010  T:  12/07/2010  Job:  [id number]  cc:   Lenon Curt. Chilton Si, M.D. Fax: [phone]  Electronically Signed by Hillery Aldo M.D. on 12/09/2010 02:24:20 PM

## 2010-12-10 LAB — COMPREHENSIVE METABOLIC PANEL
ALT: 12 U/L (ref 0–35)
Albumin: 3.3 g/dL — ABNORMAL LOW (ref 3.5–5.2)
Alkaline Phosphatase: 67 U/L (ref 39–117)
Calcium: 11.3 mg/dL — ABNORMAL HIGH (ref 8.4–10.5)
GFR calc Af Amer: 68 mL/min — ABNORMAL LOW (ref >90)
Glucose, Bld: 153 mg/dL — ABNORMAL HIGH (ref 70–99)
Potassium: 3.8 mEq/L (ref 3.5–5.1)
Sodium: 139 mEq/L (ref 135–145)
Total Protein: 6.7 g/dL (ref 6.0–8.3)

## 2010-12-10 LAB — CBC
Hemoglobin: 12.3 g/dL (ref 12.0–15.0)
MCH: 30.8 pg (ref 26.0–34.0)
MCHC: 33.7 g/dL (ref 30.0–36.0)
RDW: 12.9 % (ref 11.5–15.5)

## 2010-12-10 LAB — DIFFERENTIAL
Basophils Absolute: 0.1 10*3/uL (ref 0.0–0.1)
Basophils Relative: 1 % (ref 0–1)
Eosinophils Absolute: 0.6 10*3/uL (ref 0.0–0.7)
Lymphocytes Relative: 31 % (ref 12–46)
Lymphs Abs: 2.3 10*3/uL (ref 0.7–4.0)
Neutro Abs: 3.6 10*3/uL (ref 1.7–7.7)
Neutrophils Relative %: 49 % (ref 43–77)

## 2010-12-10 LAB — GLUCOSE, CAPILLARY

## 2010-12-10 LAB — MAGNESIUM: Magnesium: 1.7 mg/dL (ref 1.5–2.5)

## 2010-12-11 LAB — BASIC METABOLIC PANEL WITH GFR
BUN: 22 mg/dL (ref 6–23)
CO2: 25 meq/L (ref 19–32)
Calcium: 10 mg/dL (ref 8.4–10.5)
Chloride: 105 meq/L (ref 96–112)
Creatinine, Ser: 0.9 mg/dL (ref 0.50–1.10)
GFR calc Af Amer: 62 mL/min — ABNORMAL LOW
GFR calc non Af Amer: 54 mL/min — ABNORMAL LOW
Glucose, Bld: 162 mg/dL — ABNORMAL HIGH (ref 70–99)
Potassium: 3.5 meq/L (ref 3.5–5.1)
Sodium: 139 meq/L (ref 135–145)

## 2010-12-11 LAB — GLUCOSE, CAPILLARY
Glucose-Capillary: 183 mg/dL — ABNORMAL HIGH (ref 70–99)
Glucose-Capillary: 160 mg/dL — ABNORMAL HIGH (ref 70–99)
Glucose-Capillary: 243 mg/dL — ABNORMAL HIGH (ref 70–99)

## 2010-12-11 LAB — CBC
HCT: 33.1 % — ABNORMAL LOW (ref 36.0–46.0)
Hemoglobin: 11.3 g/dL — ABNORMAL LOW (ref 12.0–15.0)
MCH: 31.2 pg (ref 26.0–34.0)
MCHC: 34.1 g/dL (ref 30.0–36.0)
MCV: 91.4 fL (ref 78.0–100.0)
Platelets: 251 10*3/uL (ref 150–400)
RBC: 3.62 MIL/uL — ABNORMAL LOW (ref 3.87–5.11)
RDW: 13 % (ref 11.5–15.5)
WBC: 7.7 10*3/uL (ref 4.0–10.5)

## 2010-12-13 LAB — CULTURE, BLOOD (ROUTINE X 2)
Culture  Setup Time: 201210131747
Culture: NO GROWTH

## 2010-12-13 LAB — VITAMIN D 1,25 DIHYDROXY
Vitamin D2 1, 25 (OH)2: <8 pg/mL
Vitamin D3 1, 25 (OH)2: 16 pg/mL

## 2010-12-25 NOTE — Discharge Summary (Signed)
[name], [name] NO.:  [id number]  MEDICAL RECORD NO.:  [id number]  LOCATION:  1337                         FACILITY:  [facility]  PHYSICIAN:  Hartley Barefoot, MD    DATE OF BIRTH:  Feb 23, 1917  DATE OF ADMISSION:  12/07/2010 DATE OF DISCHARGE:                        DISCHARGE SUMMARY - REFERRING   DISCHARGE DATE:  Probably December 11, 2010.  DISCHARGE DIAGNOSES: 1. Hypercalcemia of unclear etiology, workup in process. 2. Failure to thrive, multifactorial secondary to deconditioning and     hypoglycemia. 3. Hypoglycemia secondary to insulin. 4. Encephalopathy, probably secondary to hypoglycemia. 5. Chronic obstructive pulmonary edema. 6. Hypertension. 7. Stage 3 chronic kidney disease. 8. Hypothyroidism. 9. Neuropathy. 10.Hypertension. 11.  DISCHARGE MEDICATIONS: 1. Norvasc 5 mg p.o. daily. 2. Atorvastatin 20 mg p.o. daily. 3. Combivent 2 puffs inhaled 4 times a day as needed. 4. Diclofenac 1.3% topical patch, 1 patch transdermal every 12 hours     as needed. 5. Calcitonin 200 units 1 spray nasally daily. 6. Hydrocodone oral suspension 5 mL by mouth q.12 hours as needed. 7. Lisinopril 1 tablet by mouth daily. 8. Lyrica 75 mg p.o. b.i.d. 9. Metformin 1000 mg p.o. b.i.d. before meals. 10.Multivitamin 1 tablet by mouth daily. 11.Gabapentin 300 mg p.o. b.i.d. 12.Nitroglycerin 0.4 mg 1 tablet under the tongue every 5 minutes as     needed. 13.Pradaxa 150 mg twice daily. 14.Trospium 60 mg 1 tablet by mouth daily. 15.Spiriva 18 mcg p.o. inhaled daily. 16.Synthroid 75 mcg p.o. daily. 17.Tramadol 50 mg 1 tablet every 4 hours as needed. 18.Vitamin C 250 mg 1 tablet by mouth daily. 19.Vitamin E 200 units p.o. daily. 20.Zolpidem 10 mg half a tablet by mouth daily at bedtime as needed. 21.Please use sliding scale insulin as needed.  CARDIOGRAPHIC STUDIES: 1. MRI of the brain without contrast showed no acute intracranial     abnormalities.  Moderate  generalized atrophy white matter disease.     This likely reflect the sequelae of chronic microvascular ischemia.     Chronic right sphenoid sinus disease.  Left mastoid effusion     without evidence for obstructing means of her inhalation. 2. CT head without contrast showed a stable exam.  Atrophy with     chronic small vessel white matter disease.  No active infection and     no abnormality. 3. Chest x-ray, no evidence of acute cardiopulmonary disease.  BRIEF HISTORY OF PRESENT ILLNESS:  This is a very pleasant 77 year old with multiple medical problems, who was recently admitted from November 30, 2010,  through December 03, 2010, for evaluation after a fall.  She apparently suffered a right inferior pubic ramus fracture and was treated for enterococcal urinary tract infection.  She was sent home with physical therapy and antibiotics consisting of amoxicillin.  She did well for several days and on Thursday, 2 days prior to admission, the patient began to become increasingly weak and experienced muscle tremor.  She was disoriented with altered mental status and diminished coordination.  Her son, [name] ultimately called EMS due to ongoing weakness, lack of coordination and disorientation.  In the emergency department, CBG was found to be at 41.  HOSPITAL COURSE: 1. Encephalopathy, probably metabolic secondary  to hypoglycemia.  The     patient was started on D5 half-normal saline.  Her blood sugar has     been increasing.  She has been eating.  She is back to baseline.     MRI of the brain was negative for acute infarct.  CT head was also     negative. 2. Failure to thrive, weakness, likely secondary to hypoglycemia and     dehydration.  The patient is now back to baseline.  PT/OT     recommended skilled nursing facility.  She will be likely be able     to be transferred on December 11, 2010 for rehab. 3. Hypoglycemia, resolved at this time.  The patient is started on D5     half normal  saline.  The fluid has now been changed to normal     saline.  Her blood sugars are increasing now and in the 150-range.     I will discharge her off Lantus.  We can consider sliding scale     insulin as needed.  Monitor her oral intake and depending on the     amount of sliding scale that she required, we can consider low-dose     of Lantus.  I will discharge her also on metformin. 4. COPD, has remained stable. 5. Hypercalcemia.  Even though during her last hospitalization her     calcium level was normal at 9, the son relates that she had a prior     history of increase calcium level and that her calcium has been in the 11     range.  We will continue with IV fluid at this time.  Lab work is     pending.  Vitamin D level and PTH level are pending at this time.     TSH was 5 and 1.2.  We will continue with her calcitonin nasally.     She received a dose of pamidronate.  I am expecting that her calcium      level decrease within 24-48 hours.  I will stop vitamin D at this     time when the vitamin D level results available we can consider start     vitamin D supplements. 6. Stage 3 chronic kidney disease.  Stable at this time.  BUN at 17     and creatinine 0.84. 7. Hypertension.  Continue with Norvasc.  Please hold lisinopril if     blood pressure less than 100. 8. Disposition:  The patient might be able to be discharged on December 11, 2010 if stable and if calcium level is decreased.  Although,     she might be able to be discharged even if her calcium is at 34,     because she has a prior history of hypercalcemia and she is not     symptomatic at this time.     Hartley Barefoot, MD     BR/MEDQ  D:  12/10/2010  T:  12/10/2010  Job:  [id number]  Electronically Signed by Hartley Barefoot MD on 12/25/2010 04:26:54 PM

## 2011-04-28 ENCOUNTER — Encounter: Payer: Self-pay | Admitting: Internal Medicine

## 2011-05-05 ENCOUNTER — Encounter: Payer: Self-pay | Admitting: Internal Medicine

## 2011-05-06 ENCOUNTER — Encounter: Payer: Self-pay | Admitting: Internal Medicine

## 2011-05-06 ENCOUNTER — Ambulatory Visit (INDEPENDENT_AMBULATORY_CARE_PROVIDER_SITE_OTHER): Payer: Medicare Other | Admitting: Internal Medicine

## 2011-05-06 ENCOUNTER — Ambulatory Visit (INDEPENDENT_AMBULATORY_CARE_PROVIDER_SITE_OTHER)
Admission: RE | Admit: 2011-05-06 | Discharge: 2011-05-06 | Disposition: A | Discharge status: Home / Self Care | Payer: Medicare Other | Source: Ambulatory Visit | Attending: Internal Medicine | Admitting: Internal Medicine

## 2011-05-06 VITALS — BP 130/68 | HR 80 | Temp 99.0°F | Ht 62.0 in | Wt 163.6 lb

## 2011-05-06 DIAGNOSIS — R05 Cough: Secondary | ICD-10-CM | POA: Insufficient documentation

## 2011-05-06 DIAGNOSIS — R059 Cough, unspecified: Secondary | ICD-10-CM | POA: Insufficient documentation

## 2011-05-06 DIAGNOSIS — I10 Essential (primary) hypertension: Secondary | ICD-10-CM

## 2011-05-06 MED ORDER — OLMESARTAN MEDOXOMIL 20 MG PO TABS: ORAL_TABLET | ORAL | Status: DC

## 2011-05-06 NOTE — Progress Notes (Signed)
  Subjective:    Patient ID: [name], female    DOB: 05-22-1916   MRN: [id number]  HPI  61 yowm never smoker never resp or allergy problems with new onset waxing and waning cough since 08/2010 referred 05/06/2011  by Dr Chilton Si to pulmonary clinic.   05/06/2011 1st pulmonary ov cc persistent cough indolent onset 08/2010 gradually worse with sensation of need to cough up mucus and what did come up was intermittently green but that cleared with abx and by time of ov no longer productive at all,  worst after supper and bedtime and then goes away while supine/sleeping with no am exac. No better with inhalers, denies sob but very sendentary. No active sinus or hb symptoms.    Also denies any obvious fluctuation of symptoms with weather or environmental changes or other aggravating or alleviating factors except as outlined above .    Review of Systems  Constitutional: Negative for fever, chills and unexpected weight change.  HENT: Negative for ear pain, nosebleeds, congestion, sore throat, rhinorrhea, sneezing, trouble swallowing, dental problem, voice change, postnasal drip and sinus pressure.   Eyes: Negative for visual disturbance.  Respiratory: Positive for cough. Negative for choking and shortness of breath.   Cardiovascular: Positive for leg swelling. Negative for chest pain.  Gastrointestinal: Negative for vomiting, abdominal pain and diarrhea.  Genitourinary: Negative for difficulty urinating.  Musculoskeletal: Negative for arthralgias.  Skin: Negative for rash.  Neurological: Negative for tremors, syncope and headaches.  Hematological: Does not bruise/bleed easily.       Objective:   Physical Exam Delightful w/c bound elderly wf nad with classic upper airway cough  Wt 163 05/06/2011   Mod kyphosis  HEENT: nl dentition, turbinates, and orophanx. Nl external ear canals without cough reflex   NECK :  without JVD/Nodes/TM/ nl carotid upstrokes bilaterally   LUNGS: no acc  muscle use, clear to A and P bilaterally without cough on insp or exp maneuvers.   CV:  RRR  no s3 or murmur or increase in P2, no edema   ABD:  soft and nontender with nl excursion in the supine position. No bruits or organomegaly, bowel sounds nl  MS:  warm without deformities, calf tenderness, cyanosis or clubbing  SKIN: warm and dry without lesions    NEURO:  alert, approp, no deficits   CXR  05/06/2011 :  1. Stable chronic and postoperative changes. No acute disease.      Assessment & Plan:

## 2011-05-06 NOTE — Assessment & Plan Note (Signed)
ACE inhibitors are problematic in  pts with airway complaints because  even experienced pulmonologists can't always distinguish ace effects from copd/asthma/pnds/ allergies etc.  By themselves they don't actually cause a problem, much like oxygen can't by itself start a fire, but they certainly serve as a powerful catalyst or enhancer for any "fire"  or inflammatory process in the upper airway, be it caused by an ET  tube or more commonly reflux (especially in the obese or pts with known GERD or who are on biphoshonates) or URI's, due to interference with bradykinin clearance.  The effects of acei on bradykinin levels occurs in 100% of pt's on acei (unless they surreptitiously stop the med!) but the classic cough is only reported in 5%.  This leaves 95% of pts on acei's  with a variety of syndromes including no identifiable symptom in most  vs non-specific symptoms that wax and wane depending on what other insult is occuring at the level of the upper airway.  If cough resolves off acei I would avoid this class completely  For now stop lisinopril and rx with samples benicar 20 mg one half daily x 6 week samples

## 2011-05-06 NOTE — Assessment & Plan Note (Signed)
The most common causes of chronic cough in immunocompetent adults include the following: upper airway cough syndrome (UACS), previously referred to as postnasal drip syndrome (PNDS), which is caused by variety of rhinosinus conditions; (2) asthma; (3) GERD; (4) chronic bronchitis from cigarette smoking or other inhaled environmental irritants; (5) nonasthmatic eosinophilic bronchitis; and (6) bronchiectasis.   These conditions, singly or in combination, have accounted for up to 94% of the causes of chronic cough in prospective studies.   Other conditions have constituted no >6% of the causes in prospective studies These have included bronchogenic carcinoma, chronic interstitial pneumonia, sarcoidosis, left ventricular failure, ACEI-induced cough, and aspiration from a condition associated with pharyngeal dysfunction.   .Chronic cough is often simultaneously caused by more than one condition. A single cause has been found from 38 to 82% of the time, multiple causes from 18 to 62%. Multiply caused cough has been the result of three diseases up to 42% of the time.     Most likely this is  Classic Upper airway cough syndrome, so named because it's frequently impossible to sort out how much is  CR/sinusitis with freq throat clearing (which can be related to primary GERD)   vs  causing  secondary (" extra esophageal")  GERD from wide swings in gastric pressure that occur with throat clearing, often  promoting self use of mint and menthol lozenges that reduce the lower esophageal sphincter tone and exacerbate the problem further in a cyclical fashion.   These are the same pts (now being labeled as having "irritable larynx syndrome" by some cough centers) who not infrequently have a history of having failed to tolerate ace inhibitors,  dry powder inhalers or biphosphonates or report having atypical reflux symptoms that don't respond to standard doses of PPI , and are easily confused as having aecopd or asthma  flares by even experienced allergists/ pulmonologists.   She is presently on acei so first step is stop the acei and rx short term for reflux and see if cough resolves, if so pulmonary f/u prn

## 2011-05-06 NOTE — Patient Instructions (Signed)
Stop lisinopril Start Benicar 20mg  one half every day Until the cough is gone Try prilosec 20mg   Take 30-60 min before first meal of the day and Pepcid 20 mg one bedtime until cough is completely gone   GERD (REFLUX)  is an extremely common cause of respiratory symptoms, many times with no significant heartburn at all.    It can be treated with medication, but also with lifestyle changes including avoidance of late meals, excessive alcohol, smoking cessation, and avoid fatty foods, chocolate, peppermint, colas, red wine, and acidic juices such as orange juice.  NO MINT OR MENTHOL PRODUCTS SO NO COUGH DROPS  USE SUGARLESS CANDY INSTEAD (jolley ranchers or Stover's)  NO OIL BASED VITAMINS - use powdered substitutes.  If needed for breathing or cough ok to use nebulizer but if not 100% better by the one month return to this clinic.

## 2011-05-07 NOTE — Progress Notes (Signed)
Quick Note:  Spoke with pt and notified of results per Dr. Wert. Pt verbalized understanding and denied any questions.  ______ 

## 2011-06-24 ENCOUNTER — Ambulatory Visit (INDEPENDENT_AMBULATORY_CARE_PROVIDER_SITE_OTHER): Payer: Medicare Other | Admitting: Internal Medicine

## 2011-06-24 ENCOUNTER — Encounter: Payer: Self-pay | Admitting: Internal Medicine

## 2011-06-24 VITALS — BP 116/64 | HR 68 | Temp 98.6°F | Ht 60.0 in | Wt 166.0 lb

## 2011-06-24 DIAGNOSIS — I10 Essential (primary) hypertension: Secondary | ICD-10-CM

## 2011-06-24 DIAGNOSIS — R05 Cough: Secondary | ICD-10-CM

## 2011-06-24 MED ORDER — PREDNISONE (PAK) 10 MG PO TABS: ORAL_TABLET | ORAL | Status: AC

## 2011-06-24 MED ORDER — OLMESARTAN MEDOXOMIL 20 MG PO TABS: ORAL_TABLET | ORAL | Status: DC

## 2011-06-24 NOTE — Assessment & Plan Note (Signed)
Try off ACEI  05/06/2011 due to cough > 75% improved 06/24/2011   Clearly the acei was contributing to the cough but, as previously mentioned, may not be the cause of the cough.  For now thought would avoid this class entirely based on how much better she is with this one change

## 2011-06-24 NOTE — Assessment & Plan Note (Addendum)
Still feel this is  Classic Upper airway cough syndrome, so named because it's frequently impossible to sort out how much is  CR/sinusitis with freq throat clearing (which can be related to primary GERD)   vs  causing  secondary (" extra esophageal")  GERD from wide swings in gastric pressure that occur with throat clearing, often  promoting self use of mint and menthol lozenges that reduce the lower esophageal sphincter tone and exacerbate the problem further in a cyclical fashion.   These are the same pts (now being labeled as having "irritable larynx syndrome" by some cough centers) who not infrequently have a history of having failed to tolerate ace inhibitors,  dry powder inhalers or biphosphonates or report having atypical reflux symptoms that don't respond to standard doses of PPI , and are easily confused as having aecopd or asthma flares by even experienced allergists/ pulmonologists.   Will try to eliminate the cyclical cough she appears to experience at hs with tramadol and one course of prednisone, then regroup perhaps with CT sinus next based on cough worse at hs

## 2011-06-24 NOTE — Progress Notes (Signed)
  Subjective:    Patient ID: [name], female    DOB: 1917/02/03   MRN: [id number]  HPI  16 yowm never smoker never resp or allergy problems with new onset waxing and waning cough since 08/2010 referred 05/06/2011  by Dr Chilton Si to pulmonary clinic.   05/06/2011 1st pulmonary ov cc persistent cough indolent onset 08/2010 gradually worse with sensation of need to cough up mucus and what did come up was intermittently green but that cleared with abx and by time of ov no longer productive at all,  worst after supper and bedtime and then goes away while supine/sleeping with no am exac. No better with inhalers, denies sob but very sendentary. No active sinus or hb symptoms.  rec Stop lisinopril Start Benicar 20mg  one half every day Until the cough is gone Try prilosec 20mg   Take 30-60 min before first meal of the day and Pepcid 20 mg one bedtime until cough is completely gone  GERD . If needed for breathing or cough ok to use nebulizer but if not 100% better by the one month return to this clinic.  06/24/2011 f/u ov/Rahkeem Senft cc 75% better with cough still problematic at bedtime, mostly dry, coughs for a couple minutes then does fine, no excess mucus at all, no am exac, no sob though very sedentary.   Also denies any obvious fluctuation of symptoms with weather or environmental changes or other aggravating or alleviating factors except as outlined above .    ROS  At present neg for  any significant sore throat, dysphagia, dental problems, itching, sneezing,  nasal congestion or excess/ purulent secretions, ear ache,   fever, chills, sweats, unintended wt loss, pleuritic or exertional cp, hemoptysis, palpitations, orthopnea pnd or leg swelling.  Also denies presyncope, palpitations, heartburn, abdominal pain, anorexia, nausea, vomiting, diarrhea  or change in bowel or urinary habits, change in stools or urine, dysuria,hematuria,  rash, arthralgias, visual complaints, headache, numbness weakness or ataxia or  problems with walking or coordination. No noted change in mood/affect or memory.                          Objective:   Physical Exam  Delightful w/c bound elderly wf nad    Wt 163 05/06/2011 > 06/24/2011  166  Mod kyphosis  HEENT: nl dentition, turbinates, and orophanx. Nl external ear canals without cough reflex   NECK :  without JVD/Nodes/TM/ nl carotid upstrokes bilaterally   LUNGS: no acc muscle use, clear to A and P bilaterally without cough on insp or exp maneuvers.   CV:  RRR  no s3 or murmur or increase in P2, no edema   ABD:  soft and nontender with nl excursion in the supine position. No bruits or organomegaly, bowel sounds nl  MS:  warm without deformities, calf tenderness, cyanosis or clubbing     CXR  05/06/2011 :  1. Stable chronic and postoperative changes. No acute disease.      Assessment & Plan:

## 2011-06-24 NOTE — Patient Instructions (Signed)
Prednisone 10 mg take  4 each am x 2 days,   2 each am x 2 days,  1 each am x2days and stop  Take tramadol one hour before bed  GERD (REFLUX)  is an extremely common cause of respiratory symptoms, many times with no significant heartburn at all.    It can be treated with medication, but also with lifestyle changes including avoidance of late meals, excessive alcohol, smoking cessation, and avoid fatty foods, chocolate, peppermint, colas, red wine, and acidic juices such as orange juice.  NO MINT OR MENTHOL PRODUCTS SO NO COUGH DROPS  USE SUGARLESS CANDY INSTEAD (jolley ranchers or Stover's)  NO OIL BASED VITAMINS - use powdered substitutes.    If you are satisfied with your treatment plan let your doctor know and he/she can either refill your medications or you can return here when your prescription runs out.     If in any way you are not 100% satisfied,  please tell us.  If 100% better, tell your friends!

## 2011-08-11 ENCOUNTER — Emergency Department (HOSPITAL_COMMUNITY): Payer: Medicare Other

## 2011-08-11 ENCOUNTER — Emergency Department (HOSPITAL_COMMUNITY)
Admission: EM | Admit: 2011-08-11 | Discharge: 2011-08-11 | Disposition: A | Discharge status: Home / Self Care | Payer: Medicare Other | Attending: Emergency Medicine | Admitting: Emergency Medicine

## 2011-08-11 ENCOUNTER — Encounter (HOSPITAL_COMMUNITY): Payer: Self-pay | Admitting: Emergency Medicine

## 2011-08-11 DIAGNOSIS — M810 Age-related osteoporosis without current pathological fracture: Secondary | ICD-10-CM | POA: Insufficient documentation

## 2011-08-11 DIAGNOSIS — W19XXXA Unspecified fall, initial encounter: Secondary | ICD-10-CM

## 2011-08-11 DIAGNOSIS — IMO0001 Reserved for inherently not codable concepts without codable children: Secondary | ICD-10-CM | POA: Insufficient documentation

## 2011-08-11 DIAGNOSIS — I10 Essential (primary) hypertension: Secondary | ICD-10-CM | POA: Insufficient documentation

## 2011-08-11 DIAGNOSIS — M79609 Pain in unspecified limb: Secondary | ICD-10-CM | POA: Insufficient documentation

## 2011-08-11 DIAGNOSIS — E039 Hypothyroidism, unspecified: Secondary | ICD-10-CM | POA: Insufficient documentation

## 2011-08-11 DIAGNOSIS — Y92009 Unspecified place in unspecified non-institutional (private) residence as the place of occurrence of the external cause: Secondary | ICD-10-CM | POA: Insufficient documentation

## 2011-08-11 DIAGNOSIS — W010XXA Fall on same level from slipping, tripping and stumbling without subsequent striking against object, initial encounter: Secondary | ICD-10-CM | POA: Insufficient documentation

## 2011-08-11 DIAGNOSIS — E785 Hyperlipidemia, unspecified: Secondary | ICD-10-CM | POA: Insufficient documentation

## 2011-08-11 DIAGNOSIS — Z79899 Other long term (current) drug therapy: Secondary | ICD-10-CM | POA: Insufficient documentation

## 2011-08-11 LAB — GLUCOSE, CAPILLARY: Glucose-Capillary: 119 mg/dL — ABNORMAL HIGH (ref 70–99)

## 2011-08-11 MED ORDER — WHITE PETROLATUM GEL
Status: AC
  Administered 2011-08-11: 11:00:00
  Filled 2011-08-11: qty 5

## 2011-08-11 MED ORDER — OXYCODONE-ACETAMINOPHEN 5-325 MG PO TABS: 2.0000 | ORAL_TABLET | ORAL | Status: AC | PRN

## 2011-08-11 NOTE — ED Notes (Signed)
Pt states she fell on Friday and had no c/o of pain then. Pt was able to get up with assistance from her son and was able to walk with her walker.  Pt began having right knee pain and right foot pain Saturday. Pt with large bruise to right upper arm and to top of right foot.  Pt also c/o mild swelling to right knee. Pt with bilateral leg swelling that is normal for pt.  Pt able to flex right knee but with increased pain.  Pt denies hitting head or any other injuries.

## 2011-08-11 NOTE — ED Provider Notes (Signed)
History     CSN: [id number]  Arrival date & time 08/11/11  0908   First MD Initiated Contact with Patient 08/11/11 [phone]      Chief Complaint  Patient presents with  . Fall  . Leg Pain    (Consider location/radiation/quality/duration/timing/severity/associated sxs/prior treatment) Patient is a 78 y.o. female presenting with leg pain. The history is provided by the patient and a relative.  Leg Pain   she walks with a walker.  She lives with her son.   3 d ago she tripped while walking on a carpet with her walker.  She laid on the floor a few hours until her son found her.   She denied pain when he found her.  She was able to get up and perform her routine activities, including bathing and walking.   Since yesterday, she has had progressive right leg pain.  She says it is "excrutiating" when she stands.  She denies ha, neck pain, cp , abd pain.   While she is lying, she has no pain.  She is on pradaxa.    Past Medical History  Diagnosis Date  . Diabetes type 2, uncontrolled   . Bronchitis, acute   . Cough   . Anemia   . Gait disorder   . Hypertension   . Malignant neoplasm of colon   . Other disorder of calcium metabolism   . Myoclonus   . Hypothyroid   . Mild memory disturbance   . Hyperlipidemia   . Scoliosis   . Osteoporosis   . Eczema   . Hemoptysis     History reviewed. No pertinent past surgical history.  History reviewed. No pertinent family history.  History  Substance Use Topics  . Smoking status: Never Smoker   . Smokeless tobacco: Never Used  . Alcohol Use: No    OB History    Grav Para Term Preterm Abortions TAB SAB Ect Mult Living                  Review of Systems  HENT: Negative for neck pain.   Eyes: Negative for visual disturbance.  Cardiovascular: Negative for chest pain.  Gastrointestinal: Negative for abdominal pain.  Musculoskeletal: Negative for back pain.       Right leg pain  Skin: Negative for wound.  Neurological: Negative for  weakness and headaches.  Hematological: Bruises/bleeds easily.       Contusion over right upper arm  Psychiatric/Behavioral: Negative for confusion.  All other systems reviewed and are negative.    Allergies  Noroxin; Sulfa antibiotics; and Prozac  Home Medications   Current Outpatient Rx  Name Route Sig Dispense Refill  . IPRATROPIUM-ALBUTEROL 18-103 MCG/ACT IN AERO Inhalation Inhale 2 puffs into the lungs every 6 (six) hours as needed.    . ATORVASTATIN CALCIUM 20 MG PO TABS Oral Take 20 mg by mouth daily.    Marland Kitchen CALCITONIN (SALMON) 200 UNIT/ACT NA SOLN Nasal Place 1 spray into the nose daily.    Marland Kitchen DABIGATRAN ETEXILATE MESYLATE 150 MG PO CAPS Oral Take 150 mg by mouth every 12 (twelve) hours.    Marland Kitchen DICLOFENAC EPOLAMINE 1.3 % TD PTCH Transdermal Place 1 patch onto the skin every 12 (twelve) hours as needed.    Marland Kitchen GABAPENTIN 300 MG PO CAPS Oral Take 600 mg by mouth at bedtime.    . GUAIFENESIN 100 MG/5ML PO SYRP Oral Take 5 mLs by mouth every 6 (six) hours as needed.    Marland Kitchen LEVOTHYROXINE SODIUM 75  MCG PO TABS Oral Take 75 mcg by mouth daily.    Marland Kitchen METFORMIN HCL 1000 MG PO TABS Oral Take 1,000 mg by mouth 2 (two) times daily with a meal.    . CENTRUM SILVER PO Oral Take 1 tablet by mouth daily.    Marland Kitchen OLMESARTAN MEDOXOMIL 20 MG PO TABS  One half daily 15 tablet 0  . OXYCODONE HCL 5 MG PO CAPS Oral Take 5 mg by mouth every 6 (six) hours as needed.    Marland Kitchen PREGABALIN 75 MG PO CAPS Oral Take 75 mg by mouth 2 (two) times daily as needed.    Marland Kitchen TRAMADOL HCL 50 MG PO TABS Oral Take 50 mg by mouth every 12 (twelve) hours as needed.    . TRAVOPROST 0.004 % OP SOLN Left Eye Place 1 drop into the left eye at bedtime.    . TROLAMINE SALICYLATE 10 % EX CREA Topical Apply 1 application topically as needed.    . TROSPIUM CHLORIDE ER 60 MG PO CP24 Oral Take 1 capsule by mouth daily.    Marland Kitchen VITAMIN C 250 MG PO TABS Oral Take 250 mg by mouth daily.    Marland Kitchen ZOLPIDEM TARTRATE 10 MG PO TABS Oral Take 10 mg by mouth at  bedtime as needed.      BP 136/44  Pulse 70  Temp 99 F (37.2 C) (Oral)  Resp 18  SpO2 94%  Physical Exam  Nursing note and vitals reviewed. Constitutional: She is oriented to person, place, and time. She appears well-developed and well-nourished. No distress.  HENT:  Head: Normocephalic and atraumatic.  Eyes: Conjunctivae are normal.  Neck: Normal range of motion.  Pulmonary/Chest: Effort normal.  Musculoskeletal: She exhibits tenderness. She exhibits no edema.  Neurological: She is alert and oriented to person, place, and time.  Skin: Skin is warm and dry.       Approx. 4X4 tender ecchymosis over mid right lat humerus/upper arm No shoulder, clavicle, elbow, forearm, wrist, hand defromity, ttp, swelling on right side  Right leg + ttp at hip, knee, lower leg, foot. No deformity, ecchymosis, edema.   ROM of right leg incr. pain  Psychiatric: She has a normal mood and affect. Thought content normal.    ED Course  Procedures (including critical care time) 64 y femal fell 3 d ago while walking with walker.  Progressive pain in right leg with bruise on right arm.  Will xr right arm and lower ext.  She does not want pain meds now.   Labs Reviewed - No data to display No results found.   No diagnosis found.  ECG Normal sinus rhythm, heart rate 67. Normal axis. Right bundle branch block. Normal.  ST-T wave. Impression normal sinus rhythm with right bundle branch block.  No change from 12/07/2010  MDM  Right upper arm contusion Right leg pain. No fxs or dislocations        Cheri Guppy, MD 08/11/11 1209

## 2011-08-11 NOTE — ED Notes (Signed)
EAV:[id number]<JX> Expected date:08/11/11<BR> Expected time: 8:33 AM<BR> Means of arrival:Ambulance<BR> Comments:<BR> 78yo/f, Fall

## 2011-08-11 NOTE — ED Notes (Signed)
Per xray tech, pt c/o increased foot and ankle pain.  Xray ordered per protocol.

## 2011-08-11 NOTE — ED Notes (Signed)
Per EMS, pt from home, pt fell Friday, was able to stand up and denied any c/o. Pt now c/o pain to right leg which began Saturday and has continued to get worse.

## 2011-08-11 NOTE — Discharge Instructions (Signed)
Your x-rays do not show any broken, or dislocated bones.  Use Percocet for pain.  Followup with your Dr. for reevaluation.  If your symptoms.  Last more than 2-3 days.  Return for worse or uncontrolled symptoms

## 2011-08-11 NOTE — Progress Notes (Signed)
[facility] ED CM consulted to assist with home health PT/Aide. Cm spoke with pt and son, [name] is [place] for services Pt has used [place] for services previously x 3 and [facility] sn[facility] x 3 Reviewed home health and private duty Reviewed process for obtaining home health and snf placement from a hospital and from pcp office.  Son voiced concern and interest in services to assist him in the home with getting pt started for the day and for bedtime needs.  Provided with written information for home health, snf, [place] [facility] and right at home services.  [facility] referral completed with confirmation after attempt to reach in house [facility] staff and intake dept unsuccessfully.  Faxed 08/11/11 ED visit information to Dr A green  [phone] office fax [phone]

## 2011-08-11 NOTE — ED Notes (Signed)
Pt. Was put into gown ,put on monitor and gowned

## 2012-06-05 ENCOUNTER — Other Ambulatory Visit: Payer: Self-pay | Admitting: Internal Medicine

## 2012-06-09 ENCOUNTER — Encounter: Payer: Self-pay | Admitting: Internal Medicine

## 2012-06-09 ENCOUNTER — Other Ambulatory Visit: Payer: Self-pay | Admitting: *Deleted

## 2012-06-09 ENCOUNTER — Ambulatory Visit (INDEPENDENT_AMBULATORY_CARE_PROVIDER_SITE_OTHER): Payer: Medicare Other | Admitting: Internal Medicine

## 2012-06-09 VITALS — BP 124/64 | HR 62 | Temp 97.4°F | Resp 18 | Wt 168.0 lb

## 2012-06-09 DIAGNOSIS — F0789 Other personality and behavioral disorders due to known physiological condition: Secondary | ICD-10-CM

## 2012-06-09 DIAGNOSIS — E039 Hypothyroidism, unspecified: Secondary | ICD-10-CM | POA: Insufficient documentation

## 2012-06-09 DIAGNOSIS — E1129 Type 2 diabetes mellitus with other diabetic kidney complication: Secondary | ICD-10-CM

## 2012-06-09 DIAGNOSIS — N189 Chronic kidney disease, unspecified: Secondary | ICD-10-CM

## 2012-06-09 DIAGNOSIS — I480 Paroxysmal atrial fibrillation: Secondary | ICD-10-CM | POA: Insufficient documentation

## 2012-06-09 DIAGNOSIS — I4891 Unspecified atrial fibrillation: Secondary | ICD-10-CM

## 2012-06-09 DIAGNOSIS — I10 Essential (primary) hypertension: Secondary | ICD-10-CM

## 2012-06-09 DIAGNOSIS — D649 Anemia, unspecified: Secondary | ICD-10-CM

## 2012-06-09 DIAGNOSIS — E1165 Type 2 diabetes mellitus with hyperglycemia: Secondary | ICD-10-CM | POA: Insufficient documentation

## 2012-06-09 DIAGNOSIS — E785 Hyperlipidemia, unspecified: Secondary | ICD-10-CM

## 2012-06-09 DIAGNOSIS — E875 Hyperkalemia: Secondary | ICD-10-CM

## 2012-06-09 DIAGNOSIS — G609 Hereditary and idiopathic neuropathy, unspecified: Secondary | ICD-10-CM

## 2012-06-09 DIAGNOSIS — R413 Other amnesia: Secondary | ICD-10-CM

## 2012-06-09 DIAGNOSIS — IMO0001 Reserved for inherently not codable concepts without codable children: Secondary | ICD-10-CM

## 2012-06-09 MED ORDER — OLMESARTAN MEDOXOMIL 20 MG PO TABS: ORAL_TABLET | ORAL | Status: DC

## 2012-06-09 MED ORDER — CYCLOSPORINE 0.05 % OP EMUL: OPHTHALMIC | Status: DC

## 2012-06-09 NOTE — Progress Notes (Signed)
Subjective:    Patient ID: [name], female    DOB: Feb 18, 1917, 79 y.o.   MRN: [id number]  HPI Patient is essentially the same as on her last office visit. She continues to have some loss of memory. Gait instability is significant. She is actually having more difficulty getting in and out of a wheelchair. Her son is quite helpful and assess her in any way that he can. Patient Active Problem List  Diagnosis  . Cough    Present for about 6 months. Nonproductive. Not accompanied by fever.   Marland Kitchen HBP (high blood pressure)    Controlled on current medication   . Diabetes type 2, uncontrolled   Continues to have dietary noncompliance. CBG at home has improved.   . Anemia   Chronic. Unchanged.   . Hypothyroid    Controlled on current medication   .  Memory loss    Continued slow progression   . Hyperlipidemia    Improved   . Hyperpotassemia    Improved   . Chronic kidney disease, unspecified    Unchanged   . Atrial fibrillation    Rate controlled   . Unspecified hereditary and idiopathic peripheral neuropathy   Continues with numbness and discomfort in her feet and lower leg.      Review of Systems  Constitutional: Positive for activity change and fatigue. Negative for fever, chills and unexpected weight change.  HENT: Positive for hearing loss. Negative for ear pain.   Eyes: Negative.        Wears corrective lenses  Cardiovascular: Positive for leg swelling.  Gastrointestinal: Negative.   Endocrine:       History of diabetes  Genitourinary:       Incontinence  Musculoskeletal: Positive for myalgias, back pain, arthralgias and gait problem.  Skin: Negative.   Neurological: Positive for weakness and numbness. Negative for dizziness, tremors, seizures, syncope, speech difficulty and light-headedness.       Numb in the feet and the lower legs  Hematological: Negative.   Psychiatric/Behavioral:       Memory loss and associated mild confusion.       Objective:   Physical Exam  Constitutional: She is oriented to person, place, and time. No distress.  Overweight.  HENT:  Severe hearing loss in the right ear  Eyes: Conjunctivae and EOM are normal.  Neck: Normal range of motion. Neck supple. No JVD present. No tracheal deviation present. No thyromegaly present.  Cardiovascular:  Rate controlled irregularly irregular rhythm. No murmur. No gallop.  Pulmonary/Chest: No respiratory distress. She has no wheezes. She has no rales. She exhibits no tenderness.  Abdominal: She exhibits no distension and no mass. There is no tenderness.  Musculoskeletal:  Bilateral knee tenderness and crepitance. Mild back discomfort.  Lymphadenopathy:    She has no cervical adenopathy.  Neurological: She is alert and oriented to person, place, and time. No cranial nerve deficit. Coordination normal.  Mild memory loss.  Skin: Skin is warm and dry. No rash noted. She is not diaphoretic. No erythema. No pallor.  Psychiatric: She has a normal mood and affect. Her behavior is normal. Thought content normal.          Assessment & Plan:   Problem List Items Addressed This Visit     ICD-9-CM   Anemia    Stable    Hypothyroid    Compensated on current medication    Mild memory disturbance    Continue to monitor. Repeat MMSE next visit.  Hyperlipidemia    Compensated by present medication    Relevant Medications      furosemide (LASIX) 20 MG tablet   Hyperpotassemia    Resolved    Chronic kidney disease, unspecified    Stable    Atrial fibrillation    Unchanged. Rate controlled.    Relevant Medications      furosemide (LASIX) 20 MG tablet   Unspecified essential hypertension    Controlled on current medication    Relevant Medications      furosemide (LASIX) 20 MG tablet   Unspecified hereditary and idiopathic peripheral neuropathy   Type II or unspecified type diabetes mellitus with renal manifestations, not stated as uncontrolled(250.40)   Improved. Will  continue current medications.    Relevant Orders      CMP with eGFR   Other disorder of calcium metabolism    Chronic elevations of calcium for at least 20 years. Unchanged.    Relevant Orders      CMP with eGFR    Return in 3 months

## 2012-06-09 NOTE — Patient Instructions (Addendum)
Continue current medications. 

## 2012-06-10 LAB — CBC WITH DIFFERENTIAL/PLATELET
Basophils Absolute: 0 10*3/uL (ref 0.0–0.2)
Immature Grans (Abs): 0 10*3/uL (ref 0.0–0.1)
Immature Granulocytes: 0 % (ref 0–2)
Lymphocytes Absolute: 2.5 10*3/uL (ref 0.7–3.1)
Lymphs: 36 % (ref 14–46)
MCHC: 34.1 g/dL (ref 31.5–35.7)
Monocytes: 8 % (ref 4–12)
Neutrophils Relative %: 53 % (ref 40–74)
RDW: 13.7 % (ref 12.3–15.4)

## 2012-06-10 LAB — COMPREHENSIVE METABOLIC PANEL
AST: 18 IU/L (ref 0–40)
Albumin/Globulin Ratio: 2.2 (ref 1.1–2.5)
Alkaline Phosphatase: 65 IU/L (ref 39–117)
BUN/Creatinine Ratio: 16 (ref 11–26)
CO2: 28 mmol/L (ref 19–28)
Creatinine, Ser: 1.05 mg/dL — ABNORMAL HIGH (ref 0.57–1.00)
Globulin, Total: 2 g/dL (ref 1.5–4.5)
Sodium: 142 mmol/L (ref 134–144)

## 2012-06-10 LAB — HEMOGLOBIN A1C: Hgb A1c MFr Bld: 7 % — ABNORMAL HIGH (ref 4.8–5.6)

## 2012-06-14 ENCOUNTER — Other Ambulatory Visit: Payer: Self-pay | Admitting: Internal Medicine

## 2012-06-14 MED ORDER — ATORVASTATIN CALCIUM 20 MG PO TABS: ORAL_TABLET | ORAL | Status: DC

## 2012-06-21 ENCOUNTER — Other Ambulatory Visit: Payer: Self-pay | Admitting: Internal Medicine

## 2012-07-10 ENCOUNTER — Other Ambulatory Visit: Payer: Self-pay | Admitting: Internal Medicine

## 2012-07-18 IMAGING — CT CT HEAD W/O CM
2 series · 16 of 30 positions shown, 20 images · non-contrast
Comparison: 11/29/2010

CLINICAL DATA: Weakness

CT HEAD WITHOUT CONTRAST
TECHNIQUE: Contiguous axial images were obtained from the base of
the skull through the vertex without contrast.

[Series 2: head w/o · axial · non-contrast · 0.43mm/px · z∈[-144,-24]mm · 13 of 30 slices shown, 17 images]
[im 3/30  brain]
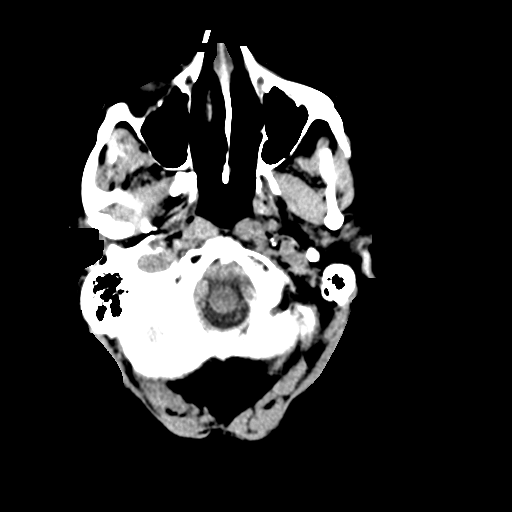
[im 3/30  bone]
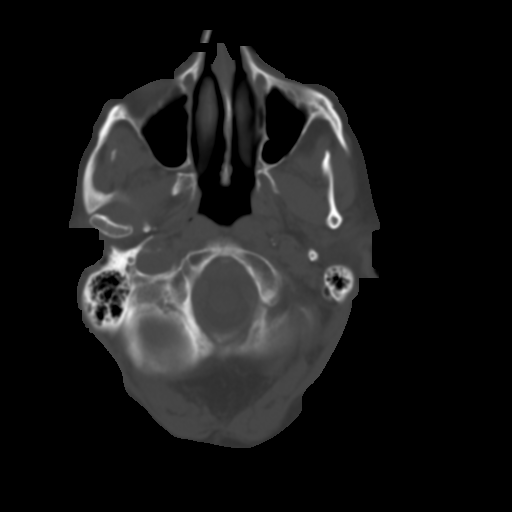
[im 5/30  brain]
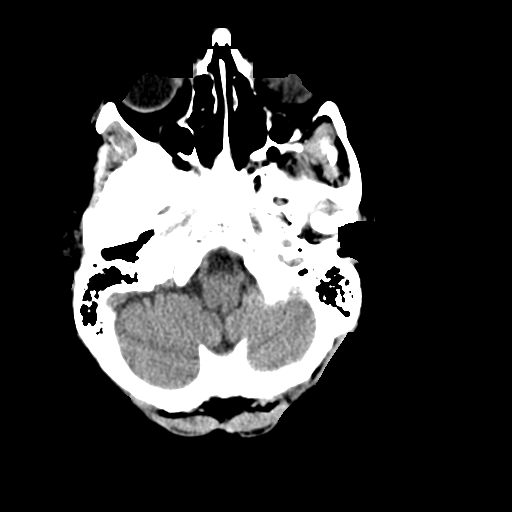
[im 7/30  brain]
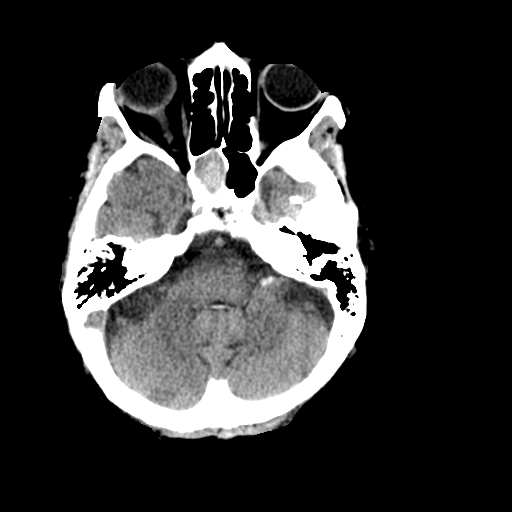
[im 9/30  brain]
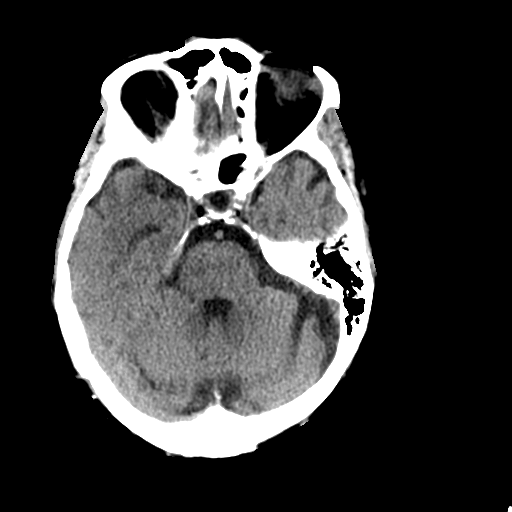
[im 11/30  brain]
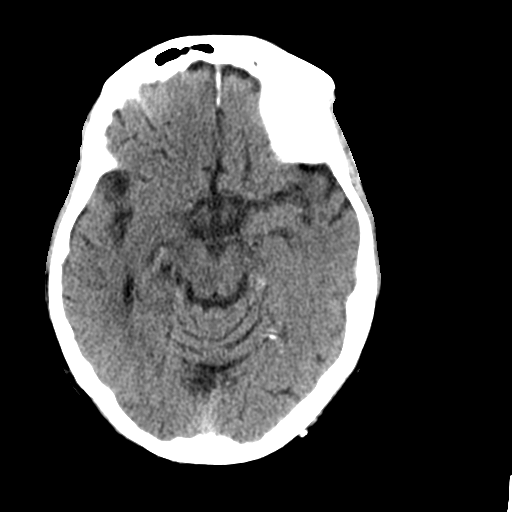
[im 11/30  bone]
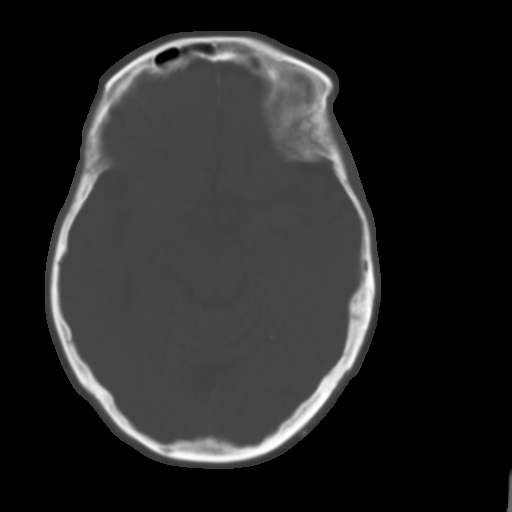
[im 13/30  brain]
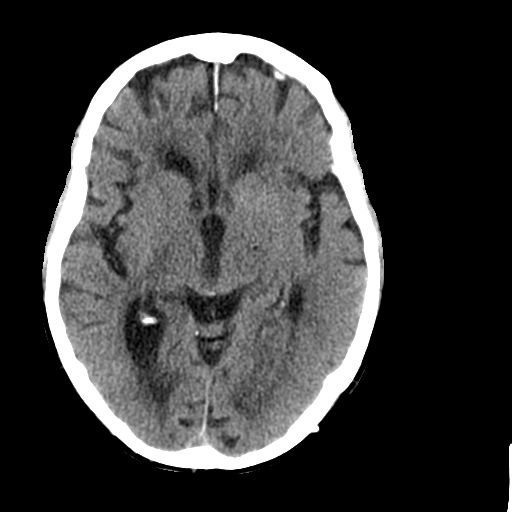
[im 15/30  brain]
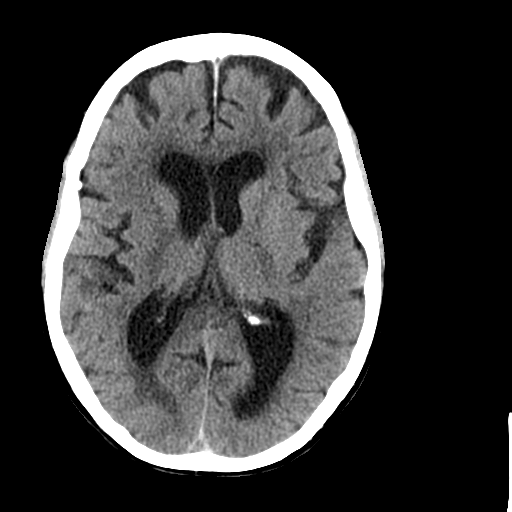
[im 17/30  brain]
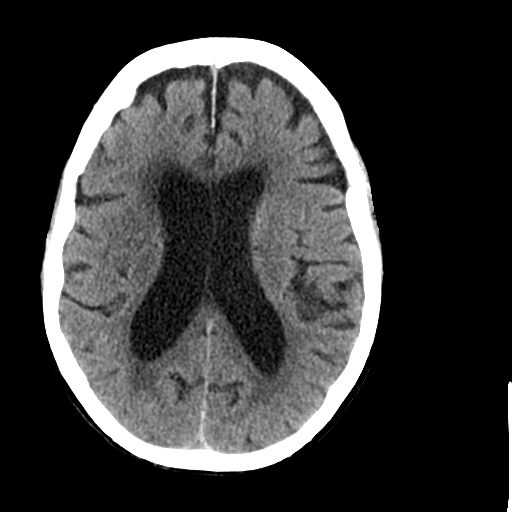
[im 19/30  brain]
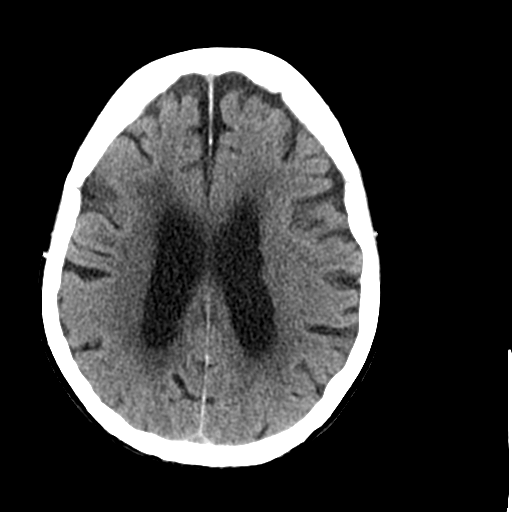
[im 19/30  bone]
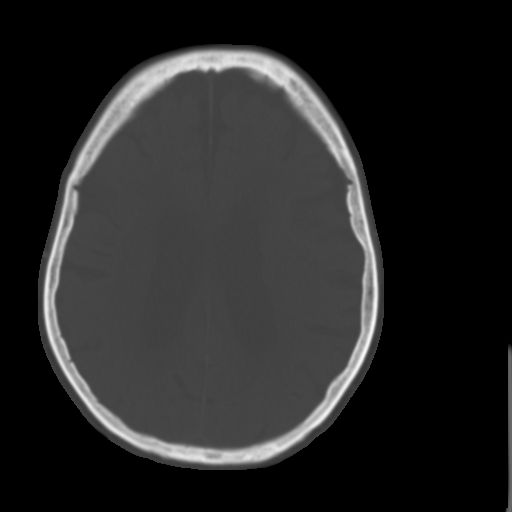
[im 21/30  brain]
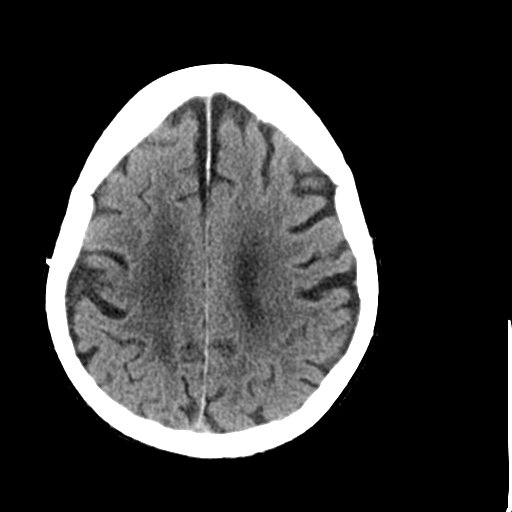
[im 23/30  brain]
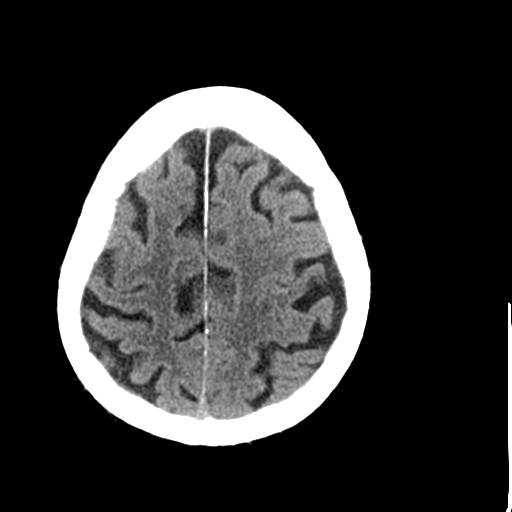
[im 25/30  brain]
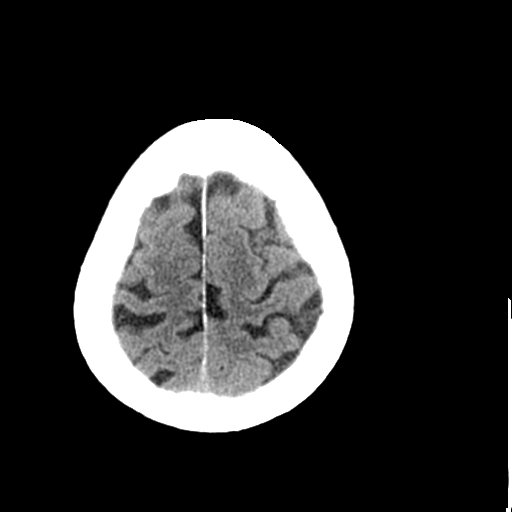
[im 27/30  brain]
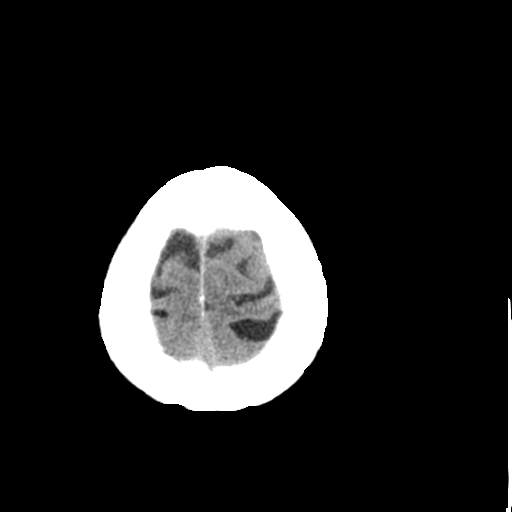
[im 27/30  bone]
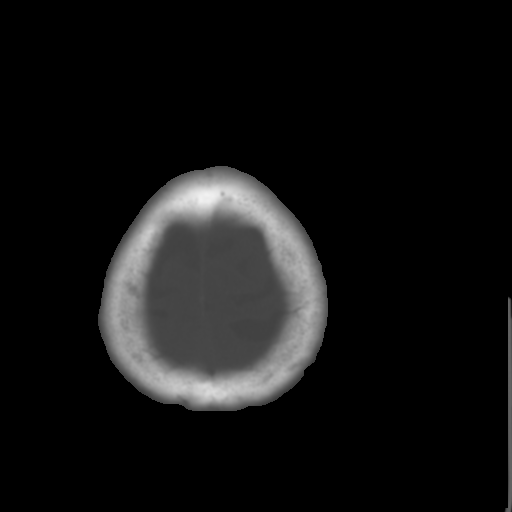

[Series 3: bone window · axial · 0.43mm/px · z∈[-144,-104]mm · 3 of 30 slices shown]
[im 3/30  bone]
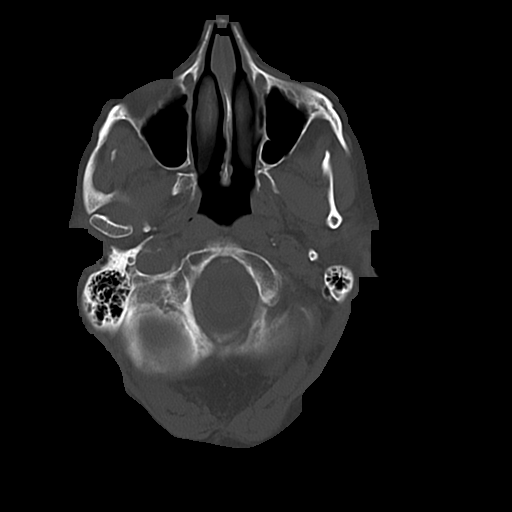
[im 7/30  bone]
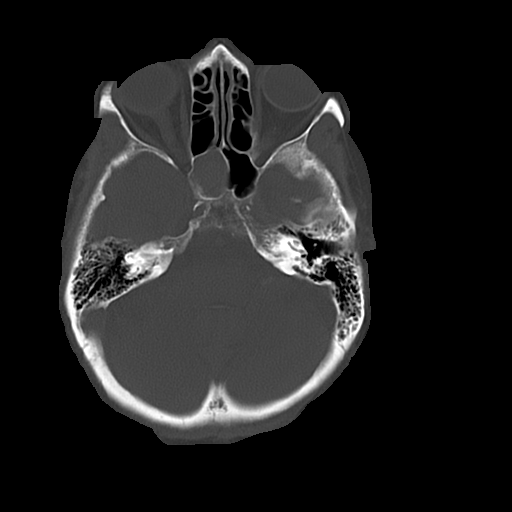
[im 11/30  bone]
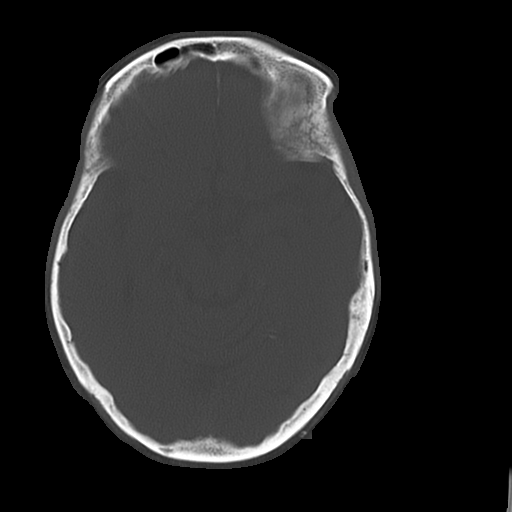

[16 of 30 positions shown; findings below may reference images not displayed]

FINDINGS: There is no evidence for acute hemorrhage, hydrocephalus,
mass lesion, or abnormal extra-axial fluid collection.  No definite
CT evidence for acute infarction.  Diffuse loss of parenchymal
volume is consistent with atrophy. Patchy low attenuation in the
deep hemispheric and periventricular white matter is nonspecific,
but likely reflects chronic microvascular ischemic demyelination.

Right sphenoid sinus is opacified, as before.  Otherwise the
visualized paranasal sinuses and mastoid air cells are clear.
IMPRESSION: Stable exam.  Atrophy with chronic small vessel white matter
disease.  No acute intracranial abnormality.

## 2012-07-28 ENCOUNTER — Encounter: Payer: Self-pay | Admitting: Internal Medicine

## 2012-07-28 ENCOUNTER — Ambulatory Visit (INDEPENDENT_AMBULATORY_CARE_PROVIDER_SITE_OTHER): Payer: PRIVATE HEALTH INSURANCE | Admitting: Internal Medicine

## 2012-07-28 ENCOUNTER — Encounter: Payer: Self-pay | Admitting: Geriatric Medicine

## 2012-07-28 VITALS — BP 122/58 | HR 71 | Temp 98.4°F | Resp 14

## 2012-07-28 DIAGNOSIS — S90821A Blister (nonthermal), right foot, initial encounter: Secondary | ICD-10-CM

## 2012-07-28 DIAGNOSIS — E1129 Type 2 diabetes mellitus with other diabetic kidney complication: Secondary | ICD-10-CM

## 2012-07-28 DIAGNOSIS — IMO0002 Reserved for concepts with insufficient information to code with codable children: Secondary | ICD-10-CM

## 2012-07-28 NOTE — Progress Notes (Signed)
  Subjective:    Patient ID: [name], female    DOB: 27-Mar-1916, 79 y.o.   MRN: [id number]  HPI  patient is here with her son with noticing a sore on her heel for past few day. She noticed some redness first and then noticed raised surface on her right heel. Denies any pain in the area. No open skin or drainage at that site. No fever or chills  Review of Systems No rash No redness or skin sores elsewere No chest pain, SOB, nausea or vomiting No abdominal pain On metformin and sugar readings under control No polyuria or polyphagia    Objective:   Physical Exam  BP 122/58  Pulse 71  Temp(Src) 98.4 F (36.9 C) (Oral)  Resp 14  SpO2 95%  gen- obese female in NAD cvs- normal s1, s2, rrr respi- b/l CTA Ext- has fluid collected blister on the calcaneus area of right heel. Skin intact. No drainage. No blisters, open skin area elsewhere. No callus noted. No leg edema Neuro- aaox 3, sensation intact     Assessment & Plan:   Blister- has a blister on her left heel with intact skin. It will eventually break and have fluid drainage. it appears to be a friction blister from her shoes. No signs of infection at present. Monitor clinically. Supportive measure for now. Recommend wearing shoes with open back for now to avoid pressure in the blister area  Diabetes mellitus- normal foot exam except for friction blister. Assess it for resolution and frequent recurrence. Continue metformin for now

## 2012-07-28 NOTE — Patient Instructions (Signed)
Keep heels floating while at rest Wear sandals/ slippers with back open When the blister opens, please monitor for sign of infection, apply neosporin ointment as needed to open area

## 2012-08-10 ENCOUNTER — Other Ambulatory Visit: Payer: Self-pay | Admitting: Internal Medicine

## 2012-08-14 ENCOUNTER — Encounter (HOSPITAL_COMMUNITY): Payer: Self-pay | Admitting: *Deleted

## 2012-08-14 ENCOUNTER — Emergency Department (HOSPITAL_COMMUNITY): Payer: Medicare Other

## 2012-08-14 ENCOUNTER — Inpatient Hospital Stay (HOSPITAL_COMMUNITY)
Admission: EM | Admit: 2012-08-14 | Discharge: 2012-08-19 | DRG: 377 | Disposition: A | Discharge status: Skilled Nursing Facility | Payer: Medicare Other | Source: Ambulatory Visit | Attending: Internal Medicine | Admitting: Internal Medicine

## 2012-08-14 DIAGNOSIS — N179 Acute kidney failure, unspecified: Secondary | ICD-10-CM | POA: Diagnosis present

## 2012-08-14 DIAGNOSIS — F0390 Unspecified dementia without behavioral disturbance: Secondary | ICD-10-CM

## 2012-08-14 DIAGNOSIS — E039 Hypothyroidism, unspecified: Secondary | ICD-10-CM | POA: Diagnosis present

## 2012-08-14 DIAGNOSIS — K5520 Angiodysplasia of colon without hemorrhage: Secondary | ICD-10-CM

## 2012-08-14 DIAGNOSIS — D649 Anemia, unspecified: Secondary | ICD-10-CM

## 2012-08-14 DIAGNOSIS — M810 Age-related osteoporosis without current pathological fracture: Secondary | ICD-10-CM | POA: Diagnosis present

## 2012-08-14 DIAGNOSIS — K2990 Gastroduodenitis, unspecified, without bleeding: Secondary | ICD-10-CM

## 2012-08-14 DIAGNOSIS — Z66 Do not resuscitate: Secondary | ICD-10-CM | POA: Diagnosis present

## 2012-08-14 DIAGNOSIS — R7402 Elevation of levels of lactic acid dehydrogenase (LDH): Secondary | ICD-10-CM | POA: Diagnosis present

## 2012-08-14 DIAGNOSIS — K31811 Angiodysplasia of stomach and duodenum with bleeding: Principal | ICD-10-CM | POA: Diagnosis present

## 2012-08-14 DIAGNOSIS — Z951 Presence of aortocoronary bypass graft: Secondary | ICD-10-CM

## 2012-08-14 DIAGNOSIS — I129 Hypertensive chronic kidney disease with stage 1 through stage 4 chronic kidney disease, or unspecified chronic kidney disease: Secondary | ICD-10-CM | POA: Diagnosis present

## 2012-08-14 DIAGNOSIS — Z993 Dependence on wheelchair: Secondary | ICD-10-CM

## 2012-08-14 DIAGNOSIS — N189 Chronic kidney disease, unspecified: Secondary | ICD-10-CM

## 2012-08-14 DIAGNOSIS — R5381 Other malaise: Secondary | ICD-10-CM | POA: Diagnosis present

## 2012-08-14 DIAGNOSIS — T45515A Adverse effect of anticoagulants, initial encounter: Secondary | ICD-10-CM | POA: Diagnosis present

## 2012-08-14 DIAGNOSIS — N183 Chronic kidney disease, stage 3 unspecified: Secondary | ICD-10-CM

## 2012-08-14 DIAGNOSIS — K2970 Gastritis, unspecified, without bleeding: Secondary | ICD-10-CM

## 2012-08-14 DIAGNOSIS — E875 Hyperkalemia: Secondary | ICD-10-CM

## 2012-08-14 DIAGNOSIS — K922 Gastrointestinal hemorrhage, unspecified: Secondary | ICD-10-CM

## 2012-08-14 DIAGNOSIS — R4182 Altered mental status, unspecified: Secondary | ICD-10-CM | POA: Diagnosis present

## 2012-08-14 DIAGNOSIS — I2489 Other forms of acute ischemic heart disease: Secondary | ICD-10-CM

## 2012-08-14 DIAGNOSIS — D500 Iron deficiency anemia secondary to blood loss (chronic): Secondary | ICD-10-CM | POA: Diagnosis present

## 2012-08-14 DIAGNOSIS — E119 Type 2 diabetes mellitus without complications: Secondary | ICD-10-CM

## 2012-08-14 DIAGNOSIS — I248 Other forms of acute ischemic heart disease: Secondary | ICD-10-CM

## 2012-08-14 DIAGNOSIS — I10 Essential (primary) hypertension: Secondary | ICD-10-CM | POA: Diagnosis present

## 2012-08-14 DIAGNOSIS — R059 Cough, unspecified: Secondary | ICD-10-CM

## 2012-08-14 DIAGNOSIS — R7401 Elevation of levels of liver transaminase levels: Secondary | ICD-10-CM

## 2012-08-14 DIAGNOSIS — I2510 Atherosclerotic heart disease of native coronary artery without angina pectoris: Secondary | ICD-10-CM | POA: Diagnosis present

## 2012-08-14 DIAGNOSIS — N390 Urinary tract infection, site not specified: Secondary | ICD-10-CM

## 2012-08-14 DIAGNOSIS — B961 Klebsiella pneumoniae [K. pneumoniae] as the cause of diseases classified elsewhere: Secondary | ICD-10-CM | POA: Diagnosis present

## 2012-08-14 DIAGNOSIS — N289 Disorder of kidney and ureter, unspecified: Secondary | ICD-10-CM

## 2012-08-14 DIAGNOSIS — E785 Hyperlipidemia, unspecified: Secondary | ICD-10-CM | POA: Diagnosis present

## 2012-08-14 DIAGNOSIS — D62 Acute posthemorrhagic anemia: Secondary | ICD-10-CM | POA: Diagnosis present

## 2012-08-14 DIAGNOSIS — Z87311 Personal history of (healed) other pathological fracture: Secondary | ICD-10-CM

## 2012-08-14 DIAGNOSIS — R195 Other fecal abnormalities: Secondary | ICD-10-CM

## 2012-08-14 DIAGNOSIS — R791 Abnormal coagulation profile: Secondary | ICD-10-CM | POA: Diagnosis present

## 2012-08-14 DIAGNOSIS — R05 Cough: Secondary | ICD-10-CM

## 2012-08-14 DIAGNOSIS — I4891 Unspecified atrial fibrillation: Secondary | ICD-10-CM

## 2012-08-14 DIAGNOSIS — G9340 Encephalopathy, unspecified: Secondary | ICD-10-CM | POA: Diagnosis present

## 2012-08-14 DIAGNOSIS — F29 Unspecified psychosis not due to a substance or known physiological condition: Secondary | ICD-10-CM | POA: Diagnosis present

## 2012-08-14 LAB — URINE MICROSCOPIC-ADD ON

## 2012-08-14 LAB — RETICULOCYTES
RBC.: 1.3 MIL/uL — ABNORMAL LOW (ref 3.87–5.11)
Retic Count, Absolute: 94.9 10*3/uL (ref 19.0–186.0)
Retic Ct Pct: 7.3 % — ABNORMAL HIGH (ref 0.4–3.1)

## 2012-08-14 LAB — COMPREHENSIVE METABOLIC PANEL
ALT: 12 U/L (ref 0–35)
Albumin: 3.4 g/dL — ABNORMAL LOW (ref 3.5–5.2)
Alkaline Phosphatase: 60 U/L (ref 39–117)
Potassium: 5.8 mEq/L — ABNORMAL HIGH (ref 3.5–5.1)
Sodium: 137 mEq/L (ref 135–145)
Total Protein: 6.5 g/dL (ref 6.0–8.3)

## 2012-08-14 LAB — URINALYSIS, ROUTINE W REFLEX MICROSCOPIC
Bilirubin Urine: NEGATIVE
Glucose, UA: NEGATIVE mg/dL
Ketones, ur: NEGATIVE mg/dL
pH: 5 (ref 5.0–8.0)

## 2012-08-14 LAB — TROPONIN I: Troponin I: 0.43 ng/mL (ref <0.30)

## 2012-08-14 LAB — CBC WITH DIFFERENTIAL/PLATELET
Basophils Absolute: 0 10*3/uL (ref 0.0–0.1)
Basophils Relative: 0 % (ref 0–1)
Eosinophils Absolute: 0 10*3/uL (ref 0.0–0.7)
MCH: 27.8 pg (ref 26.0–34.0)
MCHC: 30.3 g/dL (ref 30.0–36.0)
Neutrophils Relative %: 76 % (ref 43–77)
Platelets: 354 10*3/uL (ref 150–400)
RBC: 1.44 MIL/uL — ABNORMAL LOW (ref 3.87–5.11)

## 2012-08-14 LAB — PROTIME-INR: Prothrombin Time: 60.8 seconds — ABNORMAL HIGH (ref 11.6–15.2)

## 2012-08-14 LAB — GLUCOSE, CAPILLARY
Glucose-Capillary: 157 mg/dL — ABNORMAL HIGH (ref 70–99)
Glucose-Capillary: 173 mg/dL — ABNORMAL HIGH (ref 70–99)

## 2012-08-14 LAB — PREPARE RBC (CROSSMATCH)

## 2012-08-14 MED ORDER — DARIFENACIN HYDROBROMIDE ER 7.5 MG PO TB24
7.5000 mg | ORAL_TABLET | Freq: Every day | ORAL | Status: DC
  Administered 2012-08-15 – 2012-08-19 (×5): 7.5 mg via ORAL
  Filled 2012-08-14 (×6): qty 1

## 2012-08-14 MED ORDER — LEVOTHYROXINE SODIUM 75 MCG PO TABS
75.0000 ug | ORAL_TABLET | Freq: Every day | ORAL | Status: DC
  Administered 2012-08-14 – 2012-08-19 (×6): 75 ug via ORAL
  Filled 2012-08-14 (×7): qty 1

## 2012-08-14 MED ORDER — SODIUM CHLORIDE 0.9 % IJ SOLN
3.0000 mL | Freq: Two times a day (BID) | INTRAMUSCULAR | Status: DC
  Administered 2012-08-14 – 2012-08-17 (×4): 3 mL via INTRAVENOUS

## 2012-08-14 MED ORDER — TRAMADOL HCL 50 MG PO TABS
50.0000 mg | ORAL_TABLET | Freq: Two times a day (BID) | ORAL | Status: DC | PRN
  Administered 2012-08-17 (×2): 50 mg via ORAL
  Filled 2012-08-14 (×2): qty 1

## 2012-08-14 MED ORDER — CENTRUM SILVER PO TABS: 1.0000 | ORAL_TABLET | Freq: Every day | ORAL | Status: DC

## 2012-08-14 MED ORDER — ONDANSETRON HCL 4 MG/2ML IJ SOLN: 4.0000 mg | Freq: Four times a day (QID) | INTRAMUSCULAR | Status: DC | PRN

## 2012-08-14 MED ORDER — DEXTROSE 5 % IV SOLN
1.0000 g | Freq: Once | INTRAVENOUS | Status: AC
  Administered 2012-08-14: 1 g via INTRAVENOUS
  Filled 2012-08-14: qty 10

## 2012-08-14 MED ORDER — ACETAMINOPHEN 325 MG PO TABS: 650.0000 mg | ORAL_TABLET | Freq: Four times a day (QID) | ORAL | Status: DC | PRN

## 2012-08-14 MED ORDER — SODIUM CHLORIDE 0.9 % IV SOLN: INTRAVENOUS | Status: DC

## 2012-08-14 MED ORDER — PREGABALIN 50 MG PO CAPS
75.0000 mg | ORAL_CAPSULE | Freq: Two times a day (BID) | ORAL | Status: DC | PRN
  Administered 2012-08-15 – 2012-08-18 (×4): 75 mg via ORAL
  Filled 2012-08-14 (×2): qty 3
  Filled 2012-08-14 (×2): qty 1

## 2012-08-14 MED ORDER — SODIUM CHLORIDE 0.9 % IV SOLN
INTRAVENOUS | Status: DC
  Administered 2012-08-14: 13:00:00 via INTRAVENOUS

## 2012-08-14 MED ORDER — CALCITONIN (SALMON) 200 UNIT/ACT NA SOLN
1.0000 | Freq: Every day | NASAL | Status: DC
  Administered 2012-08-14 – 2012-08-19 (×6): 1 via NASAL
  Filled 2012-08-14: qty 3.7

## 2012-08-14 MED ORDER — ONDANSETRON HCL 4 MG PO TABS: 4.0000 mg | ORAL_TABLET | Freq: Four times a day (QID) | ORAL | Status: DC | PRN

## 2012-08-14 MED ORDER — GABAPENTIN 300 MG PO CAPS
600.0000 mg | ORAL_CAPSULE | Freq: Every day | ORAL | Status: DC
  Administered 2012-08-14 – 2012-08-18 (×5): 600 mg via ORAL
  Filled 2012-08-14 (×6): qty 2

## 2012-08-14 MED ORDER — INSULIN ASPART 100 UNIT/ML ~~LOC~~ SOLN
0.0000 [IU] | Freq: Every day | SUBCUTANEOUS | Status: DC
  Administered 2012-08-15 – 2012-08-18 (×3): 2 [IU] via SUBCUTANEOUS

## 2012-08-14 MED ORDER — ACETAMINOPHEN 650 MG RE SUPP: 650.0000 mg | Freq: Four times a day (QID) | RECTAL | Status: DC | PRN

## 2012-08-14 MED ORDER — VITAMIN C 250 MG PO TABS
250.0000 mg | ORAL_TABLET | Freq: Every day | ORAL | Status: DC
  Administered 2012-08-15 – 2012-08-19 (×5): 250 mg via ORAL
  Filled 2012-08-14 (×6): qty 1

## 2012-08-14 MED ORDER — ZOLPIDEM TARTRATE 5 MG PO TABS: 5.0000 mg | ORAL_TABLET | Freq: Every evening | ORAL | Status: DC | PRN

## 2012-08-14 MED ORDER — TRAVOPROST 0.004 % OP SOLN: 1.0000 [drp] | Freq: Every day | OPHTHALMIC | Status: DC

## 2012-08-14 MED ORDER — TROSPIUM CHLORIDE ER 60 MG PO CP24: 1.0000 | ORAL_CAPSULE | Freq: Every day | ORAL | Status: DC

## 2012-08-14 MED ORDER — INSULIN ASPART 100 UNIT/ML ~~LOC~~ SOLN
0.0000 [IU] | Freq: Three times a day (TID) | SUBCUTANEOUS | Status: DC
  Administered 2012-08-14 – 2012-08-15 (×3): 2 [IU] via SUBCUTANEOUS
  Administered 2012-08-15: 3 [IU] via SUBCUTANEOUS
  Administered 2012-08-16: 2 [IU] via SUBCUTANEOUS
  Administered 2012-08-16: 7 [IU] via SUBCUTANEOUS
  Administered 2012-08-16: 5 [IU] via SUBCUTANEOUS
  Administered 2012-08-17: 3 [IU] via SUBCUTANEOUS
  Administered 2012-08-17: 7 [IU] via SUBCUTANEOUS
  Administered 2012-08-17 – 2012-08-18 (×2): 2 [IU] via SUBCUTANEOUS
  Administered 2012-08-18: 5 [IU] via SUBCUTANEOUS
  Administered 2012-08-18: 1 [IU] via SUBCUTANEOUS
  Administered 2012-08-19: 2 [IU] via SUBCUTANEOUS
  Administered 2012-08-19: 3 [IU] via SUBCUTANEOUS

## 2012-08-14 MED ORDER — TRAVOPROST (BAK FREE) 0.004 % OP SOLN
1.0000 [drp] | Freq: Every day | OPHTHALMIC | Status: DC
  Administered 2012-08-14 – 2012-08-18 (×5): 1 [drp] via OPHTHALMIC
  Filled 2012-08-14: qty 2.5

## 2012-08-14 MED ORDER — DEXTROSE 5 % IV SOLN
1.0000 g | INTRAVENOUS | Status: DC
  Administered 2012-08-15 – 2012-08-16 (×2): 1 g via INTRAVENOUS
  Filled 2012-08-14 (×3): qty 10

## 2012-08-14 MED ORDER — BISACODYL 10 MG RE SUPP: 10.0000 mg | Freq: Once | RECTAL | Status: DC

## 2012-08-14 MED ORDER — BISACODYL 10 MG RE SUPP: 10.0000 mg | Freq: Every day | RECTAL | Status: DC | PRN

## 2012-08-14 MED ORDER — MORPHINE SULFATE 2 MG/ML IJ SOLN
2.0000 mg | INTRAMUSCULAR | Status: DC | PRN
  Administered 2012-08-15 (×2): 2 mg via INTRAVENOUS
  Filled 2012-08-14 (×2): qty 1

## 2012-08-14 MED ORDER — ADULT MULTIVITAMIN W/MINERALS CH
1.0000 | ORAL_TABLET | Freq: Every day | ORAL | Status: DC
  Administered 2012-08-15 – 2012-08-19 (×5): 1 via ORAL
  Filled 2012-08-14 (×6): qty 1

## 2012-08-14 NOTE — ED Notes (Signed)
hospitalist at bedside

## 2012-08-14 NOTE — ED Notes (Signed)
Phlebotomy at bedside.

## 2012-08-14 NOTE — ED Notes (Signed)
Per ems: pt from home, lives with son. Son reports decreased LOC over the past 2-3 weeks, worse this morning. Pt tired, eyes closed on transportation to ED, only responding to yes/no questions. Pt feels hot to touch. Son reports pt never diagnosed with dementia but does have memory loss. bp 104/64, pulse 106, respirations 16, cbg 215

## 2012-08-14 NOTE — ED Notes (Signed)
ZOX:[id number]<AV> Expected date:08/14/12<BR> Expected time: 9:38 AM<BR> Means of arrival:Ambulance<BR> Comments:<BR> AMS ? UTI

## 2012-08-14 NOTE — Progress Notes (Signed)
  Comment:  Initial troponin is elevated at 0.43. This is likely due to demand ischemia due to patient's profound anemia, although NSTEMI is a possibility. She has no chest pain or SOB, she is already coagulopathic due to Padaxa, and has positive FOBT, so would not add ASA at this time, although this can be considered at a later time. Given her advanced age, dementia, severe anemia and elevated creatinine, she certainly would not be a candidate for invasive cardiac work up at this time.  Plan:  1. Observe. 2. Complete cycling CEs. 3. 2D echocardiogram.    C. Kenzleigh Sedam. MD, FACP.

## 2012-08-14 NOTE — H&P (Signed)
PCP:   Kimber Relic, MD   Chief Complaint:  Progressive weakness.   HPI: This is a 79 year old female, with known history of DM-2, dyslipidemia, hypothyroidism, CKD-3 (baseline creatinine is 1.05 on 06/09/12), chronic anemia, vitamin D deficiency, peripheral neuropathy, gait disorder, history of falls (none for the past 2 years), HTN, atrial fibrillation on Pradaxa, CAD, s/p CABG 1998, dementia, OA, s/p left knee arthroscopy, s/p lumbar laminectomy, osteoporosis, lumber and thoracic compression fractures, s/p vertebroplasty, previous pelvic fracture, scoliosis, acquired trigger finger, carpal tunnel syndrome, atopic dermatitis, myoclonus, macular degeneration, glaucoma. Patient is unable to contribute to history, but according to her son/HPOA, [name] (Tel: [phone]), who was present in the ED, she has become progressively weak in the past 3 weeks or so. She has also become increasingly confused in the last 3 weeks, particularly in the mornings, and has had episodes of visual hallucinosis. Patient is bed-bound and wheelchair bound, and has good appetite,. She has had no hematemesis or melena, and no hematochezia. In addition, she has had no abdominal pain, vomiting or diarrhea, fever, SOB or chills. She does have a tendency to constipation. In the ED, BP was 105/40-93/69, HB 4.0, MCV 91.7 (HB 13.5 on 06/09/12), FOBT positive, creatinine 1.41, BUN 53. U/A revealed pyuria and bacteriuria.    Allergies:   Allergies  Allergen Reactions  . Noroxin (Norfloxacin) Swelling  . Sulfa Antibiotics Nausea And Vomiting  . Prozac (Fluoxetine Hcl) Rash      Past Medical History  Diagnosis Date  . Diabetes type 2, uncontrolled   . Bronchitis, acute   . Cough   . Anemia   . Gait disorder   . Hypertension   . Malignant neoplasm of colon   . Other disorder of calcium metabolism   . Myoclonus   . Hypothyroid   . Mild memory disturbance   . Hyperlipidemia   . Scoliosis   . Osteoporosis   .  Eczema   . Hemoptysis   . Hyperpotassemia   . Unspecified hypertrophic and atrophic condition of skin   . Seborrheic keratosis   . Postnasal drip   . Urinary tract infection, site not specified   . Hematuria, unspecified   . Open wound of knee, leg (except thigh), and ankle, without mention of complication   . Unspecified fall   . Personal history of fall   . Anemia, unspecified   . Myoclonus   . Chronic kidney disease, unspecified   . Varicose veins of lower extremities with inflammation   . Trigger finger (acquired)   . Diastasis of muscle   . Type II or unspecified type diabetes mellitus without mention of complication, not stated as uncontrolled   . Cough   . Type II or unspecified type diabetes mellitus without mention of complication, uncontrolled   . Chest pain, unspecified   . Closed fracture of lumbar vertebra without mention of spinal cord injury   . Hemoptysis, unspecified   . Personality change due to conditions classified elsewhere   . Abdominal  pain, other specified site   . Closed dislocation, thoracic vertebra   . Abdominal pain, unspecified site   . Carpal tunnel syndrome   . Other specified idiopathic peripheral neuropathy   . Unspecified constipation   . Unspecified glaucoma(365.9)   . Macular degeneration (senile) of retina, unspecified   . Unspecified urinary incontinence   . Unspecified pruritic disorder   . Unspecified closed fracture of pelvis   . Unspecified vitamin D deficiency   .  Other atopic dermatitis and related conditions   . Other disorder of calcium metabolism   . Pain in joint, forearm   . Abnormality of gait   . Arthralgia of temporomandibular joint   . Insomnia, unspecified   . Unspecified hypothyroidism   . Obesity, unspecified   . Allergic rhinitis, cause unspecified   . Atrial fibrillation   . Other and unspecified hyperlipidemia   . Unspecified essential hypertension   . Unspecified hereditary and idiopathic peripheral  neuropathy   . Arthropathy, unspecified, site unspecified   . Pain in limb   . Type II or unspecified type diabetes mellitus with peripheral circulatory disorders, not stated as uncontrolled(250.70)   . Type II or unspecified type diabetes mellitus with renal manifestations, not stated as uncontrolled(250.40)     Past Surgical History  Procedure Laterality Date  . Abdominal hysterectomy      1973  . Coronary artery bypass graft      Dr Tyrone Sage 04/1996  . Biopsy breast      2000  . Knee arthroscopy      left Dr Thurston Hole   . Lumbar spine surgery      Dr Newell Coral   . Laser laparoscopy      right eye 04/2005  . Right oophorectomy      1950    Prior to Admission medications   Medication Sig Start Date End Date Taking? Authorizing Provider  atorvastatin (LIPITOR) 20 MG tablet take 1 tablet by mouth once daily to LOWER cholesterol 06/21/12  Yes Kimber Relic, MD  calcitonin, salmon, (MIACALCIN/FORTICAL) 200 UNIT/ACT nasal spray INSTILL 1 SPRAY INTO ALTERNATING NOSTRILS ONCE DAILY FOR OSTEOPROSIS 07/10/12  Yes Kimber Relic, MD  dabigatran (PRADAXA) 150 MG CAPS Take 150 mg by mouth every 12 (twelve) hours.   Yes Historical Provider, MD  diclofenac (FLECTOR) 1.3 % PTCH Place 1 patch onto the skin every 12 (twelve) hours as needed.   Yes Historical Provider, MD  diclofenac sodium (VOLTAREN) 1 % GEL Apply topically. Apply twice a day to legs   Yes Historical Provider, MD  gabapentin (NEURONTIN) 300 MG capsule Take 600 mg by mouth at bedtime.   Yes Historical Provider, MD  levothyroxine (SYNTHROID, LEVOTHROID) 75 MCG tablet TAKE 1 TABLET BY MOUTH ONCE DAILY FOR THYROID SUPPLEMENT 06/05/12  Yes Kimber Relic, MD  metFORMIN (GLUCOPHAGE) 1000 MG tablet take 1 tablet by mouth twice a day TO CONTROL DIABETES 06/14/12  Yes Kimber Relic, MD  Multiple Vitamins-Minerals (CENTRUM SILVER PO) Take 1 tablet by mouth daily.   Yes Historical Provider, MD  olmesartan (BENICAR) 20 MG tablet Take 10 mg by  mouth daily. Takes 1/2 tablet   Yes Historical Provider, MD  pregabalin (LYRICA) 75 MG capsule Take 75 mg by mouth 2 (two) times daily as needed (pain).    Yes Historical Provider, MD  traMADol (ULTRAM) 50 MG tablet Take 50 mg by mouth every 12 (twelve) hours as needed for pain.    Yes Historical Provider, MD  travoprost, benzalkonium, (TRAVATAN) 0.004 % ophthalmic solution Place 1 drop into the left eye at bedtime.   Yes Historical Provider, MD  Trospium Chloride 60 MG CP24 Take 1 capsule by mouth daily.   Yes Historical Provider, MD  vitamin C (ASCORBIC ACID) 250 MG tablet Take 250 mg by mouth daily.   Yes Historical Provider, MD  zolpidem (AMBIEN) 10 MG tablet Take 5 mg by mouth at bedtime as needed for sleep.    Yes Historical Provider, MD  Social History: Patient reports that she has never smoked. She has never used smokeless tobacco. She reports that she does not drink alcohol or use illicit drugs.  Family History  Problem Relation Age of Onset  . Heart disease Mother   . Cancer Sister     Review of Systems:  As per HPI and chief complaint. Patent is fatigued, denies  diminished appetite, weight loss, fever, chills, headache, blurred vision, difficulty in speaking, dysphagia, chest pain, cough, shortness of breath, orthopnea, paroxysmal nocturnal dyspnea, nausea, diaphoresis, abdominal pain, vomiting, diarrhea, belching, heartburn, hematemesis, melena, lower extremity pain, or redness. The rest of the systems review is negative.  Physical Exam:  General:  Patient does not appear to be in obvious acute distress. Alert, communicative, talking in complete sentences, not short of breath at rest.  HEENT:  Profound clinical pallor, no jaundice, no conjunctival injection or discharge. NECK:  Supple, JVP not seen, no carotid bruits, no palpable lymphadenopathy, no palpable goiter. CHEST:  Clinically clear to auscultation, no wheezes, no crackles. HEART:  Sounds 1 and 2 heard, normal,  regular, no murmurs. ABDOMEN:  Obese, soft, non-tender, no palpable organomegaly, no palpable masses, normal bowel sounds. GENITALIA:  Not examined. LOWER EXTREMITIES:  Moderate pitting edema, palpable peripheral pulses. MUSCULOSKELETAL SYSTEM:  Generalized osteoarthritic changes, otherwise, normal. CENTRAL NERVOUS SYSTEM:  No focal neurologic deficit on gross examination.  Labs on Admission:  Results for orders placed during the hospital encounter of 08/14/12 (from the past 48 hour(s))  URINALYSIS, ROUTINE W REFLEX MICROSCOPIC     Status: Abnormal   Collection Time    08/14/12 10:31 AM      Result Value Range   Color, Urine YELLOW  YELLOW   APPearance TURBID (*) CLEAR   Specific Gravity, Urine 1.022  1.005 - 1.030   pH 5.0  5.0 - 8.0   Glucose, UA NEGATIVE  NEGATIVE mg/dL   Hgb urine dipstick MODERATE (*) NEGATIVE   Bilirubin Urine NEGATIVE  NEGATIVE   Ketones, ur NEGATIVE  NEGATIVE mg/dL   Protein, ur 30 (*) NEGATIVE mg/dL   Urobilinogen, UA 0.2  0.0 - 1.0 mg/dL   Nitrite POSITIVE (*) NEGATIVE   Leukocytes, UA LARGE (*) NEGATIVE  URINE MICROSCOPIC-ADD ON     Status: Abnormal   Collection Time    08/14/12 10:31 AM      Result Value Range   Squamous Epithelial / LPF RARE  RARE   WBC, UA TOO NUMEROUS TO COUNT  <3 WBC/hpf   RBC / HPF FIELD OBSCURED BY WBC'S  <3 RBC/hpf   Bacteria, UA MANY (*) RARE  CBC WITH DIFFERENTIAL     Status: Abnormal   Collection Time    08/14/12 12:15 PM      Result Value Range   WBC 12.0 (*) 4.0 - 10.5 K/uL   RBC 1.44 (*) 3.87 - 5.11 MIL/uL   Hemoglobin 4.0 (*) 12.0 - 15.0 g/dL   Comment: REPEATED TO VERIFY     CRITICAL RESULT CALLED TO, READ BACK BY AND VERIFIED WITH:     MAGGIE MCGINNIS,RN [id number] @ 1244 BY J SCOTTON   HCT 13.2 (*) 36.0 - 46.0 %   MCV 91.7  78.0 - 100.0 fL   MCH 27.8  26.0 - 34.0 pg   MCHC 30.3  30.0 - 36.0 g/dL   RDW 04.5 (*) 40.9 - 81.1 %   Platelets 354  150 - 400 K/uL   Neutrophils Relative % 76  43 - 77 %   Neutro  Abs  9.1 (*) 1.7 - 7.7 K/uL   Lymphocytes Relative 18  12 - 46 %   Lymphs Abs 2.1  0.7 - 4.0 K/uL   Monocytes Relative 7  3 - 12 %   Monocytes Absolute 0.8  0.1 - 1.0 K/uL   Eosinophils Relative 0  0 - 5 %   Eosinophils Absolute 0.0  0.0 - 0.7 K/uL   Basophils Relative 0  0 - 1 %   Basophils Absolute 0.0  0.0 - 0.1 K/uL  COMPREHENSIVE METABOLIC PANEL     Status: Abnormal   Collection Time    08/14/12 12:15 PM      Result Value Range   Sodium 137  135 - 145 mEq/L   Potassium 5.8 (*) 3.5 - 5.1 mEq/L   Chloride 103  96 - 112 mEq/L   CO2 20  19 - 32 mEq/L   Glucose, Bld 180 (*) 70 - 99 mg/dL   BUN 53 (*) 6 - 23 mg/dL   Creatinine, Ser 4.09 (*) 0.50 - 1.10 mg/dL   Calcium 81.1 (*) 8.4 - 10.5 mg/dL   Total Protein 6.5  6.0 - 8.3 g/dL   Albumin 3.4 (*) 3.5 - 5.2 g/dL   AST 20  0 - 37 U/L   ALT 12  0 - 35 U/L   Alkaline Phosphatase 60  39 - 117 U/L   Total Bilirubin 0.2 (*) 0.3 - 1.2 mg/dL   GFR calc non Af Amer 31 (*) >90 mL/min   GFR calc Af Amer 36 (*) >90 mL/min   Comment:            The eGFR has been calculated     using the CKD EPI equation.     This calculation has not been     validated in all clinical     situations.     eGFR's persistently     <90 mL/min signify     possible Chronic Kidney Disease.  POCT I-STAT TROPONIN I     Status: None   Collection Time    08/14/12 12:36 PM      Result Value Range   Troponin i, poc 0.00  0.00 - 0.08 ng/mL   Comment 3            Comment: Due to the release kinetics of cTnI,     a negative result within the first hours     of the onset of symptoms does not rule out     myocardial infarction with certainty.     If myocardial infarction is still suspected,     repeat the test at appropriate intervals.  OCCULT BLOOD, POC DEVICE     Status: Abnormal   Collection Time    08/14/12  1:05 PM      Result Value Range   Fecal Occult Bld POSITIVE (*) NEGATIVE    Radiological Exams on Admission: Ct Head Wo Contrast  08/14/2012   *RADIOLOGY  REPORT*  Clinical Data: Altered mental status and fatigue.  CT HEAD WITHOUT CONTRAST  Technique:  Contiguous axial images were obtained from the base of the skull through the vertex without contrast.  Comparison: 12/07/2010  Findings: Stable advanced small vessel ischemic changes in the periventricular white matter and diffuse cortical atrophy.  The brain demonstrates no evidence of hemorrhage, infarction, edema, mass effect, extra-axial fluid collection, hydrocephalus or mass lesion.  The skull is unremarkable.  IMPRESSION: Stable atrophy and small vessel disease.  No acute findings.  Original Report Authenticated By: Irish Lack, M.D.   Dg Chest Port 1 View  08/14/2012   *RADIOLOGY REPORT*  Clinical Data: Acute mental status changes and shortness of breath.  PORTABLE CHEST - 1 VIEW  Comparison: 05/06/2011  Findings: Lung volumes are low bilaterally with bibasilar atelectasis present.  No overt edema or pulmonary consolidation is identified.  Heart size is within normal limits following prior CABG.  No significant pleural fluid is identified.  IMPRESSION: Low lung volumes with bibasilar atelectasis.   Original Report Authenticated By: Irish Lack, M.D.    Assessment/Plan Active Problems:   1. Anemia/GI bleed: Patient presented with progressive weakness over 3 weeks, associated with altered mental status, described as confusion/Hallucinosis. Initial labs demonstrated a profound normocytic anemia, with HB 4.0, MCV 91.7 (HB was 13.5 on 06/09/12) and positive FOBT. Per son, no hematemesis,melena or hematochzia has been noted, and patient has not had an endoscopy for >10 years. We have sent off anemia panel and discontinued Pradaxa. The plan is to transfuse PRBCs (she will probably need 4-5 units). Son has indicate that he would like endoscopic evaluation. This is indicated, if any plans for restarting anticoagulation in the future, are to be contemplated.   2. UTI (lower urinary tract infection):  Patient has a positive urinary sediment, with pyuria and bacteriuria, indicative of UTI. We shall commence iv Rocephin, and await urine cultures.  3. Atrial fibrillation: Patient has known atrial fibrillation, and was on Pradaxa pre-admission. Currently rate-controlled. As discussed above, Pradaxa has been discontinued for now.  4. HTN (hypertension): Patient has a known history of HTN. BP is currently borderline at 105/40-93/69 in the ED. We shall hold all antihypertensive medication for now, and observe BP ivi NS is currently in progress.  5. Diabetes mellitus: Patient is a type-2 diabetic, hitherto on Metformin. Control appears sub-optimal, based on random glucose of 180. We shall manage with diet and SSI.   6. CKD (chronic kidney disease), stage III: Creatinine is 1.41, BUN 53, against a known baseline creatinine of 1.05 on 06/09/12. This is AKI on CKD, likely due to dehydration. We shall avoid nephrotoxins. IV fluids are already in progress, commenced by ED MD, but caution is warranted, given very low HB. Will follow renal indices and fluid status.  7. Dementia/Altered mental status: Patient has known dementia, which according to PMD's office notes in EMR, has been gradually worsening. This is the likely etiology for her intermittent confusion and hallucinosis, although current acute issues, are no-doubt contributory to acute worsening. We shall check TSH for completeness. B12/Folate levels are already in progress. If hallucinosis continues, may benefit from psychiatric evaluation.  8. Hypothyroidism: Thyroxine replacement therapy continued.   Further managment will depend on clinical course.  Comment: Code status was discussed with her son/HPOA, [name], and he has confirmed that patient is DNR/DNI.   Time Spent on Admission: 1 hour.   Eulla Kochanowski,CHRISTOPHER 08/14/2012, 2:55 PM

## 2012-08-14 NOTE — Progress Notes (Signed)
INR called to Dr Brien Few, to hold Pradaxa.  Checked with blood bank, blood still being crossmatched

## 2012-08-14 NOTE — ED Notes (Signed)
Pt's son reports pt's abd appears distended, states pt has not had BM since yesterday. States pt can usually ambulate to restroom with son's assistance but pt has been too fatigued, states pt has been sleeping more frequently than usual. Also reports pt has not been speaking as much as she usually does

## 2012-08-14 NOTE — Progress Notes (Signed)
CRITICAL VALUE ALERT  Critical value received:  Trop 0.43  Date of notification: 08/14/12  Time of notification:  1648  Critical value read back:yes  Nurse who received alert:  Ernst Breach RN  MD notified (1st page): Dr. Brien Few  Time of first page: 1708  MD notified (2nd page):  Time of second page:  Responding MD: Dr. Brien Few  Time MD responded:  1710

## 2012-08-14 NOTE — Progress Notes (Signed)
CRITICAL VALUE ALERT  Critical value received: inr  Date of notification:  08/14/12  Time of notification:  1612  Critical value read back:yes  Nurse who received alert:  Allegra Grana  MD notified (1st page):  Oti  Time of first page:  1615  MD notified (2nd page):  Time of second page:  Responding MD:  Oti  Time MD responded:  [phone]

## 2012-08-14 NOTE — ED Provider Notes (Addendum)
History     CSN: [id number]  Arrival date & time 08/14/12  8295   First MD Initiated Contact with Patient 08/14/12 1004      Chief Complaint  Patient presents with  . Altered Mental Status   Level 5 caveat due to AMS (Consider location/radiation/quality/duration/timing/severity/associated sxs/prior treatment) Patient is a 79 y.o. female presenting with altered mental status. The history is provided by the patient and a relative.  Altered Mental Status  patient presents with altered mental status. She is been more confused over the last 2-3 weeks per her son. Patient states she feels bad. This morning he states that he had difficulty getting out of bed. She is normally able to get out with assistance. No chest pain. She appears pale. She's been eating less also. son states the patient has been hallucinating  Past Medical History  Diagnosis Date  . Diabetes type 2, uncontrolled   . Bronchitis, acute   . Cough   . Anemia   . Gait disorder   . Hypertension   . Malignant neoplasm of colon   . Other disorder of calcium metabolism   . Myoclonus   . Hypothyroid   . Mild memory disturbance   . Hyperlipidemia   . Scoliosis   . Osteoporosis   . Eczema   . Hemoptysis   . Hyperpotassemia   . Unspecified hypertrophic and atrophic condition of skin   . Seborrheic keratosis   . Postnasal drip   . Urinary tract infection, site not specified   . Hematuria, unspecified   . Open wound of knee, leg (except thigh), and ankle, without mention of complication   . Unspecified fall   . Personal history of fall   . Anemia, unspecified   . Myoclonus   . Chronic kidney disease, unspecified   . Varicose veins of lower extremities with inflammation   . Trigger finger (acquired)   . Diastasis of muscle   . Type II or unspecified type diabetes mellitus without mention of complication, not stated as uncontrolled   . Cough   . Type II or unspecified type diabetes mellitus without mention of  complication, uncontrolled   . Chest pain, unspecified   . Closed fracture of lumbar vertebra without mention of spinal cord injury   . Hemoptysis, unspecified   . Personality change due to conditions classified elsewhere   . Abdominal  pain, other specified site   . Closed dislocation, thoracic vertebra   . Abdominal pain, unspecified site   . Carpal tunnel syndrome   . Other specified idiopathic peripheral neuropathy   . Unspecified constipation   . Unspecified glaucoma(365.9)   . Macular degeneration (senile) of retina, unspecified   . Unspecified urinary incontinence   . Unspecified pruritic disorder   . Unspecified closed fracture of pelvis   . Unspecified vitamin D deficiency   . Other atopic dermatitis and related conditions   . Other disorder of calcium metabolism   . Pain in joint, forearm   . Abnormality of gait   . Arthralgia of temporomandibular joint   . Insomnia, unspecified   . Unspecified hypothyroidism   . Obesity, unspecified   . Allergic rhinitis, cause unspecified   . Atrial fibrillation   . Other and unspecified hyperlipidemia   . Unspecified essential hypertension   . Unspecified hereditary and idiopathic peripheral neuropathy   . Arthropathy, unspecified, site unspecified   . Pain in limb   . Type II or unspecified type diabetes mellitus with peripheral circulatory disorders, not  stated as uncontrolled(250.70)   . Type II or unspecified type diabetes mellitus with renal manifestations, not stated as uncontrolled(250.40)     Past Surgical History  Procedure Laterality Date  . Abdominal hysterectomy      1973  . Coronary artery bypass graft      Dr Tyrone Sage 04/1996  . Biopsy breast      2000  . Knee arthroscopy      left Dr Thurston Hole   . Lumbar spine surgery      Dr Newell Coral   . Laser laparoscopy      right eye 04/2005  . Right oophorectomy      1950    Family History  Problem Relation Age of Onset  . Heart disease Mother   . Cancer Sister      History  Substance Use Topics  . Smoking status: Never Smoker   . Smokeless tobacco: Never Used  . Alcohol Use: No    OB History   Grav Para Term Preterm Abortions TAB SAB Ect Mult Living                  Review of Systems  Unable to perform ROS: Dementia  Psychiatric/Behavioral: Positive for altered mental status.    Allergies  Noroxin; Sulfa antibiotics; and Prozac  Home Medications   No current outpatient prescriptions on file.  BP 95/35  Pulse 102  Temp(Src) 97.4 F (36.3 C) (Oral)  Resp 20  Ht 5\' 2"  (1.575 m)  Wt 167 lb (75.751 kg)  BMI 30.54 kg/m2  SpO2 80%  Physical Exam  Constitutional: She appears well-developed and well-nourished.  HENT:  Head: Normocephalic and atraumatic.  Eyes: Pupils are equal, round, and reactive to light.  Cardiovascular: Normal rate and regular rhythm.   Pulmonary/Chest: Effort normal and breath sounds normal.  Abdominal: There is no tenderness.  Neurological: She is alert.  Weight grossly appropriate. She is able to recognize her son. Mild confusion.  Skin: There is pallor.    ED Course  Procedures (including critical care time)  Labs Reviewed  CBC WITH DIFFERENTIAL - Abnormal; Notable for the following:    WBC 12.0 (*)    RBC 1.44 (*)    Hemoglobin 4.0 (*)    HCT 13.2 (*)    RDW 15.7 (*)    Neutro Abs 9.1 (*)    All other components within normal limits  COMPREHENSIVE METABOLIC PANEL - Abnormal; Notable for the following:    Potassium 5.8 (*)    Glucose, Bld 180 (*)    BUN 53 (*)    Creatinine, Ser 1.41 (*)    Calcium 11.3 (*)    Albumin 3.4 (*)    Total Bilirubin 0.2 (*)    GFR calc non Af Amer 31 (*)    GFR calc Af Amer 36 (*)    All other components within normal limits  URINALYSIS, ROUTINE W REFLEX MICROSCOPIC - Abnormal; Notable for the following:    APPearance TURBID (*)    Hgb urine dipstick MODERATE (*)    Protein, ur 30 (*)    Nitrite POSITIVE (*)    Leukocytes, UA LARGE (*)    All other  components within normal limits  URINE MICROSCOPIC-ADD ON - Abnormal; Notable for the following:    Bacteria, UA MANY (*)    All other components within normal limits  OCCULT BLOOD, POC DEVICE - Abnormal; Notable for the following:    Fecal Occult Bld POSITIVE (*)    All other components  within normal limits  URINE CULTURE  PROTIME-INR  APTT  VITAMIN B12  FOLATE  IRON AND TIBC  FERRITIN  RETICULOCYTES  TSH  TROPONIN I  TROPONIN I  TROPONIN I  POCT I-STAT TROPONIN I  TYPE AND SCREEN  PREPARE RBC (CROSSMATCH)  ABO/RH   Ct Head Wo Contrast  08/14/2012   *RADIOLOGY REPORT*  Clinical Data: Altered mental status and fatigue.  CT HEAD WITHOUT CONTRAST  Technique:  Contiguous axial images were obtained from the base of the skull through the vertex without contrast.  Comparison: 12/07/2010  Findings: Stable advanced small vessel ischemic changes in the periventricular white matter and diffuse cortical atrophy.  The brain demonstrates no evidence of hemorrhage, infarction, edema, mass effect, extra-axial fluid collection, hydrocephalus or mass lesion.  The skull is unremarkable.  IMPRESSION: Stable atrophy and small vessel disease.  No acute findings.   Original Report Authenticated By: Irish Lack, M.D.   Dg Chest Port 1 View  08/14/2012   *RADIOLOGY REPORT*  Clinical Data: Acute mental status changes and shortness of breath.  PORTABLE CHEST - 1 VIEW  Comparison: 05/06/2011  Findings: Lung volumes are low bilaterally with bibasilar atelectasis present.  No overt edema or pulmonary consolidation is identified.  Heart size is within normal limits following prior CABG.  No significant pleural fluid is identified.  IMPRESSION: Low lung volumes with bibasilar atelectasis.   Original Report Authenticated By: Irish Lack, M.D.     1. Anemia   2. GI bleed   3. Renal insufficiency   4. Hyperkalemia   5. Altered mental status   6. Atrial fibrillation   7. CKD (chronic kidney disease), stage  III   8. Diabetes mellitus      Date: 08/14/2012  Rate: 86  Rhythm: normal sinus rhythm  QRS Axis: normal  Intervals: normal  ST/T Wave abnormalities: nonspecific ST/T changes  Conduction Disutrbances:right bundle branch block and left posterior fascicular block  Narrative Interpretation: mildly increased ST changes.   Old EKG Reviewed: changes noted    MDM  Patient resents with generalized weakness and some altered mental status. Found to have urinary tract infection. Also has anemia and renal insufficiency. Hemoglobin of 4 and was guaiac positive. She is on anticoagulation. She has a "living Will". She'll be admitted to internal medicine. The son was informed of her overall poor prognosis. Patient has mild hyperkalemia but no changes on EKG.      [employer]  [employer]. Rubin Payor, MD 08/14/12 1603  [name]. Rubin Payor, MD 08/14/12 [id number]

## 2012-08-15 DIAGNOSIS — N189 Chronic kidney disease, unspecified: Secondary | ICD-10-CM

## 2012-08-15 LAB — CBC
HCT: 18.1 % — ABNORMAL LOW (ref 36.0–46.0)
MCH: 28.8 pg (ref 26.0–34.0)
MCH: 29.7 pg (ref 26.0–34.0)
MCHC: 33.7 g/dL (ref 30.0–36.0)
MCHC: 34.2 g/dL (ref 30.0–36.0)
MCV: 86.7 fL (ref 78.0–100.0)
Platelets: 218 10*3/uL (ref 150–400)
RDW: 14.9 % (ref 11.5–15.5)

## 2012-08-15 LAB — COMPREHENSIVE METABOLIC PANEL
ALT: 175 U/L — ABNORMAL HIGH (ref 0–35)
AST: 154 U/L — ABNORMAL HIGH (ref 0–37)
Albumin: 2.8 g/dL — ABNORMAL LOW (ref 3.5–5.2)
Alkaline Phosphatase: 46 U/L (ref 39–117)
CO2: 22 mEq/L (ref 19–32)
Chloride: 104 mEq/L (ref 96–112)
GFR calc non Af Amer: 40 mL/min — ABNORMAL LOW (ref >90)
Potassium: 4.3 mEq/L (ref 3.5–5.1)
Sodium: 136 mEq/L (ref 135–145)
Total Bilirubin: 0.5 mg/dL (ref 0.3–1.2)

## 2012-08-15 LAB — GLUCOSE, CAPILLARY
Glucose-Capillary: 181 mg/dL — ABNORMAL HIGH (ref 70–99)
Glucose-Capillary: 221 mg/dL — ABNORMAL HIGH (ref 70–99)
Glucose-Capillary: 207 mg/dL — ABNORMAL HIGH (ref 70–99)

## 2012-08-15 LAB — PROTIME-INR
INR: 3.25 — ABNORMAL HIGH (ref 0.00–1.49)
Prothrombin Time: 31.4 seconds — ABNORMAL HIGH (ref 11.6–15.2)

## 2012-08-15 LAB — TSH: TSH: 1.781 u[IU]/mL (ref 0.350–4.500)

## 2012-08-15 LAB — PREPARE RBC (CROSSMATCH)

## 2012-08-15 LAB — VITAMIN B12: Vitamin B-12: 430 pg/mL (ref 211–911)

## 2012-08-15 MED ORDER — FUROSEMIDE 10 MG/ML IJ SOLN
40.0000 mg | Freq: Once | INTRAMUSCULAR | Status: AC
  Administered 2012-08-15: 40 mg via INTRAVENOUS
  Filled 2012-08-15: qty 4

## 2012-08-15 MED ORDER — METOPROLOL TARTRATE 25 MG PO TABS
25.0000 mg | ORAL_TABLET | Freq: Two times a day (BID) | ORAL | Status: DC
  Administered 2012-08-15 – 2012-08-19 (×9): 25 mg via ORAL
  Filled 2012-08-15 (×10): qty 1

## 2012-08-15 NOTE — Progress Notes (Signed)
Notified floor coverage about patient's critical lab values. Hgb is 6.1 two hours after 2 units of blood transfusion, Trop went from 0.43 to 0.95.  Patient is still confused but is now resting.  Will continue to monitor.    Ernesta Amble, RN

## 2012-08-15 NOTE — Progress Notes (Addendum)
[facility] PROGRESS NOTE  [name]:[id number] DOB: January 06, 1917 DOA: 08/14/2012 PCP: Kimber Relic, MD  Assessment/Plan: 1. Acute on chronic blood loss anemia: -heme positive, significant drop in Hb from baseline 11-13 -Transfused 3 units PRBC 6/21 -with demand ischemia from severe anemia -Gi workup once cardiac condition stable -Pradaxa on hold -h/o unremarkable colonoscopy 9 years ago  2. Demand Ischemia -cycle cardiac enzymes -add metoprolol, unable to add ASA or Heparin in setting of 1 -Not a candidate for cath or invasive intervention due to age, dementia and GI bleed, this was explained to son in detail -check 2D ECHO  3. UTI:  - UA suggestive of this - continue  iv Rocephin, and await urine cultures.   4. Atrial fibrillation:  -Patient has known atrial fibrillation, and was on Pradaxa pre-admission. Currently rate-controlled. As discussed above, Pradaxa has been discontinued for now.   5. HTN (hypertension):  -stable, cut down IVF  6. Diabetes mellitus:  -metformin on hold, SSI   7. CKD (chronic kidney disease), stage III: Creatinine improved with hydration  8. Dementia/Altered mental status: Patient has known dementia, which according to PMD's office notes in EMR, has been gradually worsening. This is the likely etiology for her intermittent confusion and hallucinosis, although current acute issues, are no-doubt contributory to acute worsening. TSH and B12 normal   8. Hypothyroidism: Thyroxine replacement therapy continued.    Code Status: DNR Family Communication: called and d/w son via telephone Disposition Plan: to be determined   Antibiotics:  CEftraixone  HPI/Subjective: No specific complaints, transfused 3 units PRBC  Objective: Filed Vitals:   08/15/12 0520 08/15/12 0615 08/15/12 0635 08/15/12 1000  BP: 127/62 129/60 128/52 123/58  Pulse: 87 80 83 86  Temp: 98.7 F (37.1 C) 97.9 F (36.6 C) 99.1 F (37.3 C)   TempSrc: Oral  Oral Oral   Resp: 18 20 22    Height:      Weight:      SpO2:   99%     Intake/Output Summary (Last 24 hours) at 08/15/12 1102 Last data filed at 08/15/12 0830  Gross per 24 hour  Intake 1944.17 ml  Output      0 ml  Net 1944.17 ml   Filed Weights   08/14/12 1530  Weight: 75.751 kg (167 lb)    Exam:   General:  AA oriented to self and place  Cardiovascular: S1S2/RRR  Respiratory: Diminished at bases  Abdomen: soft, Nt, BS present  Musculoskeletal: trace edema    Data Reviewed: Basic Metabolic Panel:  Recent Labs Lab 08/14/12 1215 08/15/12 0922  NA 137 136  K 5.8* 4.3  CL 103 104  CO2 20 22  GLUCOSE 180* 217*  BUN 53* 57*  CREATININE 1.41* 1.15*  CALCIUM 11.3* 9.8   Liver Function Tests:  Recent Labs Lab 08/14/12 1215 08/15/12 0922  AST 20 154*  ALT 12 175*  ALKPHOS 60 46  BILITOT 0.2* 0.5  PROT 6.5 5.5*  ALBUMIN 3.4* 2.8*   No results found for this basename: LIPASE, AMYLASE,  in the last 168 hours No results found for this basename: AMMONIA,  in the last 168 hours CBC:  Recent Labs Lab 08/14/12 1215 08/15/12 0100 08/15/12 0922  WBC 12.0* 10.3 11.0*  NEUTROABS 9.1*  --   --   HGB 4.0* 6.1* 7.6*  HCT 13.2* 18.1* 22.2*  MCV 91.7 85.4 86.7  PLT 354 225 218   Cardiac Enzymes:  Recent Labs Lab 08/14/12 1548 08/15/12 0100 08/15/12 8119  TROPONINI 0.43* 0.95* 1.08*   BNP (last 3 results) No results found for this basename: PROBNP,  in the last 8760 hours CBG:  Recent Labs Lab 08/14/12 1642 08/14/12 2145 08/15/12 0714  GLUCAP 157* 173* 181*    No results found for this or any previous visit (from the past 240 hour(s)).   Studies: Ct Head Wo Contrast  08/14/2012   *RADIOLOGY REPORT*  Clinical Data: Altered mental status and fatigue.  CT HEAD WITHOUT CONTRAST  Technique:  Contiguous axial images were obtained from the base of the skull through the vertex without contrast.  Comparison: 12/07/2010  Findings: Stable advanced  small vessel ischemic changes in the periventricular white matter and diffuse cortical atrophy.  The brain demonstrates no evidence of hemorrhage, infarction, edema, mass effect, extra-axial fluid collection, hydrocephalus or mass lesion.  The skull is unremarkable.  IMPRESSION: Stable atrophy and small vessel disease.  No acute findings.   Original Report Authenticated By: Irish Lack, M.D.   Dg Chest Port 1 View  08/14/2012   *RADIOLOGY REPORT*  Clinical Data: Acute mental status changes and shortness of breath.  PORTABLE CHEST - 1 VIEW  Comparison: 05/06/2011  Findings: Lung volumes are low bilaterally with bibasilar atelectasis present.  No overt edema or pulmonary consolidation is identified.  Heart size is within normal limits following prior CABG.  No significant pleural fluid is identified.  IMPRESSION: Low lung volumes with bibasilar atelectasis.   Original Report Authenticated By: Irish Lack, M.D.    Scheduled Meds: . bisacodyl  10 mg Rectal Once  . calcitonin (salmon)  1 spray Alternating Nares Daily  . cefTRIAXone (ROCEPHIN)  IV  1 g Intravenous Q24H  . darifenacin  7.5 mg Oral Daily  . gabapentin  600 mg Oral QHS  . insulin aspart  0-5 Units Subcutaneous QHS  . insulin aspart  0-9 Units Subcutaneous TID WC  . levothyroxine  75 mcg Oral QAC breakfast  . metoprolol tartrate  25 mg Oral BID  . multivitamin with minerals  1 tablet Oral Daily  . sodium chloride  3 mL Intravenous Q12H  . Travoprost (BAK Free)  1 drop Left Eye QHS  . vitamin C  250 mg Oral Daily   Continuous Infusions: . sodium chloride 50 mL/hr at 08/14/12 2300    Active Problems:   GI bleed   HTN (hypertension)   Diabetes mellitus   CKD (chronic kidney disease), stage III   Dementia   Altered mental status   UTI (lower urinary tract infection)    Time spent:    [facility]  [facility] Pager [phone]. If 7PM-7AM, please contact night-coverage at [email or web address], password  [facility] 08/15/2012, 11:02 AM  LOS: 1 day

## 2012-08-15 NOTE — Progress Notes (Signed)
[facility] progress note. Chief complaint. Chest pain, abnormal troponin. History of present illness. This 79 year old female in hospital with anemia secondary to GI bleed. Her admitting hemoglobin was 4.0. She was admitted and transfused 2 units of packed red blood cells. Posttransfusion hemoglobin resulted 6.1. I ordered a third unit of packed red blood cells to be transfused which is in process now. Patient also noted to have elevated troponin 0.43 on admission. This was thought likely due to demand ischemia secondary to profound anemia no non-ST elevated MI a possibility. Per Doctor Oti's note the patient already coagulopathic do to Predaxa and positive for occult blood as aspirin was not added. The patient was not felt to be a candidate for invasive cardiac workup do to dementia, severe anemia, and elevated creatinine. Her second troponin has resulted elevated 0.95. Third troponin is still pending. The patient complained to nursing of chest pain and I came immediately to her bedside. I asked the patient if she was having chest pain and she indicated now and in fact will hurt were her toes. Patient is a very poor historian secondary to advanced dementia. Vital signs. Temperature 97.9, pulse 80, respiration 20, blood pressure 120/60. O2 sats 99%. General appearance. Well-developed elderly female who is alert but not oriented. Cardiac. Rate and rhythm regular. No pain with palpation over the sternal area. Lungs. Breath sounds clear and equal. Abdomen. Soft with positive bowel sounds. No pain. Impression/plan. Problem 1. Chest pain, abnormal troponin. The patient is clearly a poor historian and has no current complaints of chest pain. Would defer nitroglycerin as her only complaint currently is that her toes are hurting. We'll continue to follow for the next troponin. 12-lead EKG was obtained and this does not appear significantly different from that obtained on admission. Patient is clearly not a  candidate for any further anticoagulation such as heparin given GI bleed. Will defer decision regarding cardiology consult to the rounding physician this a.m.

## 2012-08-15 NOTE — Progress Notes (Signed)
  Echocardiogram 2D Echocardiogram has been performed.  [name] M 08/15/2012, 11:44 AM

## 2012-08-16 ENCOUNTER — Ambulatory Visit (HOSPITAL_COMMUNITY): Payer: Medicare Other

## 2012-08-16 ENCOUNTER — Encounter (HOSPITAL_COMMUNITY): Payer: Self-pay | Admitting: Gastroenterology

## 2012-08-16 DIAGNOSIS — K922 Gastrointestinal hemorrhage, unspecified: Secondary | ICD-10-CM

## 2012-08-16 DIAGNOSIS — I248 Other forms of acute ischemic heart disease: Secondary | ICD-10-CM | POA: Diagnosis present

## 2012-08-16 DIAGNOSIS — K2970 Gastritis, unspecified, without bleeding: Secondary | ICD-10-CM

## 2012-08-16 DIAGNOSIS — R195 Other fecal abnormalities: Secondary | ICD-10-CM

## 2012-08-16 DIAGNOSIS — F0390 Unspecified dementia without behavioral disturbance: Secondary | ICD-10-CM

## 2012-08-16 DIAGNOSIS — Q2733 Arteriovenous malformation of digestive system vessel: Secondary | ICD-10-CM

## 2012-08-16 LAB — URINE CULTURE: Colony Count: >=100000

## 2012-08-16 LAB — BASIC METABOLIC PANEL
CO2: 27 mEq/L (ref 19–32)
Chloride: 108 mEq/L (ref 96–112)
Glucose, Bld: 166 mg/dL — ABNORMAL HIGH (ref 70–99)
Potassium: 3.8 mEq/L (ref 3.5–5.1)
Sodium: 141 mEq/L (ref 135–145)

## 2012-08-16 LAB — CBC
Hemoglobin: 6.7 g/dL — CL (ref 12.0–15.0)
Platelets: 195 10*3/uL (ref 150–400)
RBC: 2.27 MIL/uL — ABNORMAL LOW (ref 3.87–5.11)
WBC: 10.9 10*3/uL — ABNORMAL HIGH (ref 4.0–10.5)

## 2012-08-16 LAB — TROPONIN I: Troponin I: 0.86 ng/mL (ref <0.30)

## 2012-08-16 LAB — GLUCOSE, CAPILLARY
Glucose-Capillary: 259 mg/dL — ABNORMAL HIGH (ref 70–99)
Glucose-Capillary: 344 mg/dL — ABNORMAL HIGH (ref 70–99)

## 2012-08-16 LAB — PREPARE RBC (CROSSMATCH)

## 2012-08-16 MED ORDER — FUROSEMIDE 10 MG/ML IJ SOLN: 40.0000 mg | Freq: Once | INTRAMUSCULAR | Status: AC

## 2012-08-16 MED ORDER — FUROSEMIDE 10 MG/ML IJ SOLN
INTRAMUSCULAR | Status: AC
  Administered 2012-08-16: 40 mg via INTRAVENOUS
  Filled 2012-08-16: qty 4

## 2012-08-16 MED ORDER — PANTOPRAZOLE SODIUM 40 MG IV SOLR
40.0000 mg | Freq: Two times a day (BID) | INTRAVENOUS | Status: DC
  Administered 2012-08-16 – 2012-08-19 (×7): 40 mg via INTRAVENOUS
  Filled 2012-08-16 (×8): qty 40

## 2012-08-16 MED ORDER — ENSURE COMPLETE PO LIQD
237.0000 mL | Freq: Three times a day (TID) | ORAL | Status: DC
  Administered 2012-08-16 – 2012-08-19 (×8): 237 mL via ORAL

## 2012-08-16 NOTE — Progress Notes (Signed)
[facility] PROGRESS NOTE  MINNETTA [name] ZOX:[id number] DOB: [birth year] DOA: 08/14/2012 PCP: Kimber Relic, MD  Assessment/Plan: 1. Acute on chronic blood loss anemia: -heme positive, significant drop in Hb from baseline 11-13 -Transfused 3 units PRBC 6/21, 1 more unite today pending -with demand ischemia from severe anemia -Will request GI eval, I called and discussed with [facility] MD on call, who agreed that aggressive cardiac workup is not  needed at this time with mild troponin bump for demand ischemia, severe anemia, age etc -Pradaxa on hold -h/o unremarkable colonoscopy 9 years ago  2. Demand Ischemia -cardiac enzymes, troponin starting to trend down, never had any chest pain -continue metoprolol, unable to add ASA or Heparin in setting of 1 -Not a candidate for cath or invasive intervention due to age, dementia and GI bleed, this was explained to son in detail, Corinda Gubler cards agrees with this as well -2D ECHO with Ef of 45-50% and no regional wall motion abnormalities  3. UTI:  - UCx with GNR - continue  iv Rocephin Day3, and await urine cultures.   4. Atrial fibrillation:  -Patient has known atrial fibrillation, and was on Pradaxa pre-admission. Currently rate-controlled. As discussed above, Pradaxa has been discontinued for now.   5. HTN (hypertension):  -stable, cut down IVF  6. Diabetes mellitus:  -metformin on hold, SSI   7. CKD (chronic kidney disease), stage III: Creatinine improved with hydration  8. Dementia/Altered mental status: Patient has known dementia, which according to PMD's office notes in EMR, has been gradually worsening. This is the likely etiology for her intermittent confusion and hallucinosis, although current acute issues, are no-doubt contributory to acute worsening. TSH and B12 normal   8. Hypothyroidism: Thyroxine replacement therapy continued.    Code Status: DNR Family Communication: called and d/w son via  telephone Disposition Plan: to be determined   Antibiotics:  CEftraixone  HPI/Subjective: transfused 3 units PRBC yesterday, no overt bleeding, forgetful , unable to remember any conversations from yetserday  Objective: Filed Vitals:   08/15/12 1000 08/15/12 1300 08/15/12 2128 08/16/12 0630  BP: 123/58 111/45 118/41 115/61  Pulse: 86 69 76 70  Temp:  98.8 F (37.1 C) 98.4 F (36.9 C) 97.8 F (36.6 C)  TempSrc:  Oral Oral Oral  Resp:  22 18 16   Height:      Weight:      SpO2:  99% 98% 99%    Intake/Output Summary (Last 24 hours) at 08/16/12 0824 Last data filed at 08/16/12 0600  Gross per 24 hour  Intake 1877.5 ml  Output      0 ml  Net 1877.5 ml   Filed Weights   08/14/12 1530  Weight: 75.751 kg (167 lb)    Exam:   General:  AA oriented to self and place, intermittently confused  Cardiovascular: S1S2/RRR  Respiratory: Diminished at bases  Abdomen: soft, Nt, BS present  Musculoskeletal: trace edema    Data Reviewed: Basic Metabolic Panel:  Recent Labs Lab 08/14/12 1215 08/15/12 0922 08/16/12 0444  NA 137 136 141  K 5.8* 4.3 3.8  CL 103 104 108  CO2 20 22 27   GLUCOSE 180* 217* 166*  BUN 53* 57* 49*  CREATININE 1.41* 1.15* 1.11*  CALCIUM 11.3* 9.8 9.5   Liver Function Tests:  Recent Labs Lab 08/14/12 1215 08/15/12 0922  AST 20 154*  ALT 12 175*  ALKPHOS 60 46  BILITOT 0.2* 0.5  PROT 6.5 5.5*  ALBUMIN 3.4* 2.8*   No results  found for this basename: LIPASE, AMYLASE,  in the last 168 hours No results found for this basename: AMMONIA,  in the last 168 hours CBC:  Recent Labs Lab 08/14/12 1215 08/15/12 0100 08/15/12 0922 08/16/12 0444  WBC 12.0* 10.3 11.0* 10.9*  NEUTROABS 9.1*  --   --   --   HGB 4.0* 6.1* 7.6* 6.7*  HCT 13.2* 18.1* 22.2* 20.0*  MCV 91.7 85.4 86.7 88.1  PLT 354 225 218 195   Cardiac Enzymes:  Recent Labs Lab 08/14/12 1548 08/15/12 0100 08/15/12 0922 08/16/12 0444  TROPONINI 0.43* 0.95* 1.08* 0.86*    BNP (last 3 results) No results found for this basename: PROBNP,  in the last 8760 hours CBG:  Recent Labs Lab 08/14/12 2145 08/15/12 0714 08/15/12 1136 08/15/12 1646 08/15/12 2300  GLUCAP 173* 181* 219* 221* 207*    Recent Results (from the past 240 hour(s))  URINE CULTURE     Status: None   Collection Time    08/14/12 10:30 AM      Result Value Range Status   Specimen Description URINE, CATHETERIZED   Final   Special Requests NONE   Final   Culture  Setup Time 08/14/2012 21:14   Final   Colony Count >=100,000 COLONIES/ML   Final   Culture GRAM NEGATIVE RODS   Final   Report Status PENDING   Incomplete     Studies: Ct Head Wo Contrast  08/14/2012   *RADIOLOGY REPORT*  Clinical Data: Altered mental status and fatigue.  CT HEAD WITHOUT CONTRAST  Technique:  Contiguous axial images were obtained from the base of the skull through the vertex without contrast.  Comparison: 12/07/2010  Findings: Stable advanced small vessel ischemic changes in the periventricular white matter and diffuse cortical atrophy.  The brain demonstrates no evidence of hemorrhage, infarction, edema, mass effect, extra-axial fluid collection, hydrocephalus or mass lesion.  The skull is unremarkable.  IMPRESSION: Stable atrophy and small vessel disease.  No acute findings.   Original Report Authenticated By: Irish Lack, M.D.   Dg Chest Port 1 View  08/14/2012   *RADIOLOGY REPORT*  Clinical Data: Acute mental status changes and shortness of breath.  PORTABLE CHEST - 1 VIEW  Comparison: 05/06/2011  Findings: Lung volumes are low bilaterally with bibasilar atelectasis present.  No overt edema or pulmonary consolidation is identified.  Heart size is within normal limits following prior CABG.  No significant pleural fluid is identified.  IMPRESSION: Low lung volumes with bibasilar atelectasis.   Original Report Authenticated By: Irish Lack, M.D.    Scheduled Meds: . bisacodyl  10 mg Rectal Once  .  calcitonin (salmon)  1 spray Alternating Nares Daily  . cefTRIAXone (ROCEPHIN)  IV  1 g Intravenous Q24H  . darifenacin  7.5 mg Oral Daily  . furosemide  40 mg Intravenous Once  . gabapentin  600 mg Oral QHS  . insulin aspart  0-5 Units Subcutaneous QHS  . insulin aspart  0-9 Units Subcutaneous TID WC  . levothyroxine  75 mcg Oral QAC breakfast  . metoprolol tartrate  25 mg Oral BID  . multivitamin with minerals  1 tablet Oral Daily  . sodium chloride  3 mL Intravenous Q12H  . Travoprost (BAK Free)  1 drop Left Eye QHS  . vitamin C  250 mg Oral Daily   Continuous Infusions:    Active Problems:   GI bleed   HTN (hypertension)   Diabetes mellitus   CKD (chronic kidney disease), stage III  Dementia   Altered mental status   UTI (lower urinary tract infection)    Time spent:    [facility]  [facility] Pager [phone]. If 7PM-7AM, please contact night-coverage at [email or web address], password [facility] 08/16/2012, 8:24 AM  LOS: 2 days

## 2012-08-16 NOTE — Progress Notes (Signed)
INITIAL NUTRITION ASSESSMENT  DOCUMENTATION CODES Per approved criteria  -Obesity Unspecified   INTERVENTION: - Will order Ensure Complete to be given if pt consumes <50% of meals - Will continue to monitor  NUTRITION DIAGNOSIS: Inadequate oral intake related to not feeling well as evidenced by 25% meal intake, son's report.   Goal: Pt to consistently consume >50% of meals  Monitor:  Weights, labs, intake  Reason for Assessment: Low braden  79 y.o. female  Admitting Dx: Progressive weakness   ASSESSMENT: Pt admitted with confusion that had been ongoing for the past 2-3 weeks. Pt found to have anemia/GI bleed in addition to UTI. Son who is the primary caregiver was present at bedside and reports that pt typically eats well at home with a good appetite, 3 meals/day. Meals consist of 1 pancake at breakfast, 1/2 sandwich for lunch, and a sweet potato for dinner. He reports when she was mobile and active she weighed 145 pounds, now weighs 167 pounds. Pt is bed and wheelchair bound. Son denies that pt has any problems chewing/swallowing. PO intake since admission has been variable, sometimes like this morning pt at only 25% of breakfast, however pt ate 100% of burger last night. Noted pt with elevated LFTs yesterday in addition to elevated glucose.   Height: Ht Readings from Last 1 Encounters:  08/14/12 5\' 2"  (1.575 m)    Weight: Wt Readings from Last 1 Encounters:  08/14/12 167 lb (75.751 kg)    Ideal Body Weight: 110 lb  % Ideal Body Weight: 152  Wt Readings from Last 10 Encounters:  08/14/12 167 lb (75.751 kg)  06/09/12 168 lb (76.204 kg)  06/24/11 166 lb (75.297 kg)  05/06/11 163 lb 9.6 oz (74.208 kg)    Usual Body Weight: 145 lb years ago per son  % Usual Body Weight: 115  BMI:  Body mass index is 30.54 kg/(m^2). Class I obesity  Estimated Nutritional Needs: Kcal: 1250-1400 Protein: 50-60g Fluid: 1.2-1.4L/day  Skin: Unstageable left heel pressure ulcer,  non-pitting RLE edema  Diet Order: Cardiac  EDUCATION NEEDS: -No education needs identified at this time   Intake/Output Summary (Last 24 hours) at 08/16/12 1100 Last data filed at 08/16/12 0600  Gross per 24 hour  Intake 1757.5 ml  Output      0 ml  Net 1757.5 ml    Last BM: 6/22  Labs:   Recent Labs Lab 08/14/12 1215 08/15/12 0922 08/16/12 0444  NA 137 136 141  K 5.8* 4.3 3.8  CL 103 104 108  CO2 20 22 27   BUN 53* 57* 49*  CREATININE 1.41* 1.15* 1.11*  CALCIUM 11.3* 9.8 9.5  GLUCOSE 180* 217* 166*    CBG (last 3)   Recent Labs  08/15/12 1136 08/15/12 1646 08/15/12 2300  GLUCAP 219* 221* 207*    Scheduled Meds: . bisacodyl  10 mg Rectal Once  . calcitonin (salmon)  1 spray Alternating Nares Daily  . cefTRIAXone (ROCEPHIN)  IV  1 g Intravenous Q24H  . darifenacin  7.5 mg Oral Daily  . gabapentin  600 mg Oral QHS  . insulin aspart  0-5 Units Subcutaneous QHS  . insulin aspart  0-9 Units Subcutaneous TID WC  . levothyroxine  75 mcg Oral QAC breakfast  . metoprolol tartrate  25 mg Oral BID  . multivitamin with minerals  1 tablet Oral Daily  . pantoprazole (PROTONIX) IV  40 mg Intravenous Q12H  . sodium chloride  3 mL Intravenous Q12H  . Travoprost (BAK Free)  1 drop Left Eye QHS  . vitamin C  250 mg Oral Daily    Continuous Infusions:   Past Medical History  Diagnosis Date  . Diabetes type 2, uncontrolled   . Bronchitis, acute   . Anemia   . Gait disorder   . Hypertension   . Other disorder of calcium metabolism   . Myoclonus   . Hypothyroid   . Mild memory disturbance   . Hyperlipidemia   . Scoliosis   . Osteoporosis   . Eczema   . Hemoptysis   . Hyperpotassemia   . Unspecified hypertrophic and atrophic condition of skin   . Seborrheic keratosis   . Urinary tract infection, site not specified   . Hematuria, unspecified   . Open wound of knee, leg (except thigh), and ankle, without mention of complication   . Personal history of fall    . Myoclonus   . Chronic kidney disease, unspecified   . Varicose veins of lower extremities with inflammation   . Trigger finger (acquired)   . Diastasis of muscle   . Type II or unspecified type diabetes mellitus without mention of complication, not stated as uncontrolled   . Chest pain, unspecified   . Closed fracture of lumbar vertebra without mention of spinal cord injury   . Personality change due to conditions classified elsewhere   . Closed dislocation, thoracic vertebra   . Carpal tunnel syndrome   . Other specified idiopathic peripheral neuropathy   . Unspecified constipation   . Unspecified glaucoma(365.9)   . Macular degeneration (senile) of retina, unspecified   . Unspecified urinary incontinence   . Unspecified pruritic disorder   . Unspecified closed fracture of pelvis   . Unspecified vitamin D deficiency   . Other atopic dermatitis and related conditions   . Other disorder of calcium metabolism   . Diabetes with renal manifestations(250.4)   . Arthralgia of temporomandibular joint   . Insomnia, unspecified   . Obesity, unspecified   . Allergic rhinitis, cause unspecified   . Atrial fibrillation   . Unspecified hereditary and idiopathic peripheral neuropathy   . Arthropathy, unspecified, site unspecified     Past Surgical History  Procedure Laterality Date  . Abdominal hysterectomy      1973  . Coronary artery bypass graft      Dr Tyrone Sage 04/1996  . Biopsy breast      2000  . Knee arthroscopy      left Dr Thurston Hole   . Lumbar spine surgery      Dr Newell Coral   . Laser laparoscopy      right eye 04/2005  . Right oophorectomy      [address]     Levon Hedger MS, RD, [place] [phone] Pager [phone] After Hours Pager

## 2012-08-16 NOTE — Clinical Documentation Improvement (Signed)
CHANGE MENTAL STATUS DOCUMENTATION CLARIFICATION   THIS DOCUMENT IS NOT A PERMANENT PART OF THE MEDICAL RECORD  TO RESPOND TO THE THIS QUERY, FOLLOW THE INSTRUCTIONS BELOW:  1. If needed, update documentation for the patient's encounter via the notes activity.  2. Access this query again and click edit on the In [employer].  3. After updating, or not, click F2 to complete all highlighted (required) fields concerning your review. Select "additional documentation in the medical record" OR "no additional documentation provided".  4. Click Sign note button.  5. The deficiency will fall out of your In Basket *Please let us know if you are not able to complete this workflow by phone or e-mail (listed below).         08/16/12  Dear Dr. Sunnie Nielsen, Leonard Schwartz Marton Redwood  In an effort to better capture your patient's severity of illness, reflect appropriate length of stay and utilization of resources, a review of the patient medical record has revealed the following indicators.    Based on your clinical judgment, please clarify and document in a progress note and/or discharge summary the clinical condition associated with the following supporting information:  In responding to this query please exercise your independent judgment.  The fact that a query is asked, does not imply that any particular answer is desired or expected.  Pt with altered mental status, and worsening confusion.  Clarification Needed    Please clarify if "altered mental status changes," and confusion in setting of GIB, ARF, and UTI can be further specified as one of the diagnoses listed below and document in pn or d/c summary.    Possible Clinical Conditions?  _______Encephalopathy (describe type if known)                       Anoxic                       Septic                       Alcoholic                        Hepatic                       Hypertensive                       Metabolic                       _______  Toxic  _______Acute delirium  _______Other Condition__________________ _______Cannot Clinically Determine   Supporting Information:  Risk Factors:  GIB  UTI,   ARF  Coagulopathy  CKD stage 3  Poss NSTEMI  ABLA  AFIB  Dementia  Confusion  Altered Mental Status  Unspecified psychosis   Signs & Symptoms:  Worsening confusion/change in MS:  Secondary to anemia and UTI per pn 08/16/12  Patient is still confused per Nurse note 08/16/12    Diagnostics: Lab: Component      Hemoglobin HCT  Latest Ref Rng      12.0 - 15.0 g/dL 40.9 - 81.1 %  11/08/7827     12:15 PM 4.0 (LL) 13.2 (L)   Component      Hemoglobin HCT  Latest Ref Rng      12.0 - 15.0 g/dL 56.2 - 13.0 %  8/65/7846  12:15 PM 4.0 (LL) 13.2 (L)   Component      Hemoglobin HCT  Latest Ref Rng      12.0 - 15.0 g/dL 40.9 - 81.1 %  11/08/7827     1:00 AM 6.1 (LL) 18.1 (L)  08/15/2012     9:22 AM 7.6 (L) 22.2 (L)  08/16/2012     4:44 AM 6.7 (LL) 20.0 (L)    Component      BUN Creatinine  Latest Ref Rng      6 - 23 mg/dL 5.62 - 1.30 mg/dL  8/65/7846     9:62 AM 57 (H) 1.15 (H)   Component      BUN Creatinine  Latest Ref Rng      6 - 23 mg/dL 9.52 - 8.41 mg/dL  05/17/4008     2:72 AM 49 (H) 1.11 (H)   Component      GFR calc non Af Amer GFR calc Af Amer  Latest Ref Rng      >90 mL/min >90 mL/min  08/15/2012     9:22 AM 40 (L) 46 (L)   Component      GFR calc non Af Amer GFR calc Af Amer  Latest Ref Rng      >90 mL/min >90 mL/min  08/16/2012     4:44 AM 41 (L) 48 (L)   Component      Prothrombin Time INR  Latest Ref Rng      11.6 - 15.2 seconds 0.00 - 1.49  08/14/2012     3:01 PM 60.8 (H) 7.94 (HH)   Component      Troponin I  Latest Ref Rng      <0.30 ng/mL  08/14/2012     3:48 PM 0.43 (HH)  08/15/2012     1:00 AM 0.95 (HH)  08/15/2012     9:22 AM 1.08 (HH)  08/16/2012     4:44 AM 0.86 (HH)   Treatment: Monitoring    Reviewed: additional documentation in the medical  record  Thank You,  Enis Slipper Lolita J. Ambrose Mantle RN, BSN, MSN/Inf, CCDS Clinical Documentation Specialist [facility] Dept Pager: [phone]  [facility] [employer] [facility] [employer]

## 2012-08-16 NOTE — Progress Notes (Signed)
Referring Provider: No ref. provider found Primary Care Physician:  Kimber Relic, MD Primary Gastroenterologist:  Gentry Fitz, saw Dr. Kinnie Scales in the past  Reason for Consultation:  GIB; anemia  HPI: [name] is a 79 y.o. female with known history of DM-2, dyslipidemia, hypothyroidism, CKD-3 (baseline creatinine is 1.05 on 06/09/12), chronic anemia, vitamin D deficiency, peripheral neuropathy, gait disorder, history of falls (none for the past 2 years), HTN, atrial fibrillation on Pradaxa, CAD, s/p CABG 1998, dementia, OA, s/p left knee arthroscopy, s/p lumbar laminectomy, osteoporosis, lumber and thoracic compression fractures, s/p vertebroplasty, previous pelvic fracture, scoliosis, acquired trigger finger, carpal tunnel syndrome, atopic dermatitis, myoclonus, macular degeneration, glaucoma. Patient is unable to contribute to history, but according to her son/HPOA, [name] (Tel: [phone]), who was present in the ED, she had become progressively weak in the past 3 weeks or so. She has also become increasingly confused in the last 3 weeks, particularly in the mornings, and has had episodes of visual hallucinations.  She has had no hematemesis or melena, and no hematochezia according to the; son adamantly confirms that her stools had been normal appearing at home. In addition, she has had no abdominal pain, vomiting or diarrhea, fever, SOB or chills. She does have a tendency to constipation. In the ED, BP was 105/40-93/69, HB 4.0, MCV 91.7 (HB 13.5 on 06/09/12), FOBT positive, creatinine 1.41, BUN 53. U/A revealed pyuria and bacteriuria.  INR was 7.94 on presentation as well.   Baseline Hgb had been between 11-13 grams in the past (13.5 grams just 2 months ago).  She has received 3 units of blood on 6/21 and Hgb increased to 7.6 grams but back down to 6.7 grams today so is going to receive one more unit.  She also had a bump in her troponins, but has been seen by cardiology and is deemed to be  secondary to demand ischemia.  ECHO shows EF of 45-50%.  Cardiology is not planning any intervention, but says that patient would be ok to withstanding GI procedure if needed.  INR is 3.25 today.  No reports of dark or bloody stool since admission.  She had a colonoscopy in 2005 by Dr. Kinnie Scales at which time there were no polyps removed, according to her son's report.  Just of note, patient's LFT's are elevated on today's labs with AST 154 and ALT 175.  Normal ALP and total bili.  Results were normal yesterday and in the past as well.    Past Medical History  Diagnosis Date  . Diabetes type 2, uncontrolled   . Bronchitis, acute   . Anemia   . Gait disorder   . Hypertension   . Malignant neoplasm of colon   . Other disorder of calcium metabolism   . Myoclonus   . Hypothyroid   . Mild memory disturbance   . Hyperlipidemia   . Scoliosis   . Osteoporosis   . Eczema   . Hemoptysis   . Hyperpotassemia   . Unspecified hypertrophic and atrophic condition of skin   . Seborrheic keratosis   . Urinary tract infection, site not specified   . Hematuria, unspecified   . Open wound of knee, leg (except thigh), and ankle, without mention of complication   . Personal history of fall   . Myoclonus   . Chronic kidney disease, unspecified   . Varicose veins of lower extremities with inflammation   . Trigger finger (acquired)   . Diastasis of muscle   . Type II  or unspecified type diabetes mellitus without mention of complication, not stated as uncontrolled   . Chest pain, unspecified   . Closed fracture of lumbar vertebra without mention of spinal cord injury   . Personality change due to conditions classified elsewhere   . Closed dislocation, thoracic vertebra   . Carpal tunnel syndrome   . Other specified idiopathic peripheral neuropathy   . Unspecified constipation   . Unspecified glaucoma(365.9)   . Macular degeneration (senile) of retina, unspecified   . Unspecified urinary incontinence    . Unspecified pruritic disorder   . Unspecified closed fracture of pelvis   . Unspecified vitamin D deficiency   . Other atopic dermatitis and related conditions   . Other disorder of calcium metabolism   . Diabetes with renal manifestations(250.4)   . Arthralgia of temporomandibular joint   . Insomnia, unspecified   . Obesity, unspecified   . Allergic rhinitis, cause unspecified   . Atrial fibrillation   . Unspecified hereditary and idiopathic peripheral neuropathy   . Arthropathy, unspecified, site unspecified     Past Surgical History  Procedure Laterality Date  . Abdominal hysterectomy      1973  . Coronary artery bypass graft      Dr Tyrone Sage 04/1996  . Biopsy breast      2000  . Knee arthroscopy      left Dr Thurston Hole   . Lumbar spine surgery      Dr Newell Coral   . Laser laparoscopy      right eye 04/2005  . Right oophorectomy      1950    Prior to Admission medications   Medication Sig Start Date End Date Taking? Authorizing Provider  atorvastatin (LIPITOR) 20 MG tablet take 1 tablet by mouth once daily to LOWER cholesterol 06/21/12  Yes Kimber Relic, MD  calcitonin, salmon, (MIACALCIN/FORTICAL) 200 UNIT/ACT nasal spray INSTILL 1 SPRAY INTO ALTERNATING NOSTRILS ONCE DAILY FOR OSTEOPROSIS 07/10/12  Yes Kimber Relic, MD  dabigatran (PRADAXA) 150 MG CAPS Take 150 mg by mouth every 12 (twelve) hours.   Yes Historical Provider, MD  diclofenac (FLECTOR) 1.3 % PTCH Place 1 patch onto the skin every 12 (twelve) hours as needed.   Yes Historical Provider, MD  diclofenac sodium (VOLTAREN) 1 % GEL Apply topically. Apply twice a day to legs   Yes Historical Provider, MD  gabapentin (NEURONTIN) 300 MG capsule Take 600 mg by mouth at bedtime.   Yes Historical Provider, MD  levothyroxine (SYNTHROID, LEVOTHROID) 75 MCG tablet TAKE 1 TABLET BY MOUTH ONCE DAILY FOR THYROID SUPPLEMENT 06/05/12  Yes Kimber Relic, MD  metFORMIN (GLUCOPHAGE) 1000 MG tablet take 1 tablet by mouth twice a  day TO CONTROL DIABETES 06/14/12  Yes Kimber Relic, MD  Multiple Vitamins-Minerals (CENTRUM SILVER PO) Take 1 tablet by mouth daily.   Yes Historical Provider, MD  olmesartan (BENICAR) 20 MG tablet Take 10 mg by mouth daily. Takes 1/2 tablet   Yes Historical Provider, MD  pregabalin (LYRICA) 75 MG capsule Take 75 mg by mouth 2 (two) times daily as needed (pain).    Yes Historical Provider, MD  traMADol (ULTRAM) 50 MG tablet Take 50 mg by mouth every 12 (twelve) hours as needed for pain.    Yes Historical Provider, MD  travoprost, benzalkonium, (TRAVATAN) 0.004 % ophthalmic solution Place 1 drop into the left eye at bedtime.   Yes Historical Provider, MD  Trospium Chloride 60 MG CP24 Take 1 capsule by mouth daily.  Yes Historical Provider, MD  vitamin C (ASCORBIC ACID) 250 MG tablet Take 250 mg by mouth daily.   Yes Historical Provider, MD  zolpidem (AMBIEN) 10 MG tablet Take 5 mg by mouth at bedtime as needed for sleep.    Yes Historical Provider, MD    Current Facility-Administered Medications  Medication Dose Route Frequency Provider Last Rate Last Dose  . acetaminophen (TYLENOL) tablet 650 mg  650 mg Oral Q6H PRN Laveda Norman, MD       Or  . acetaminophen (TYLENOL) suppository 650 mg  650 mg Rectal Q6H PRN Laveda Norman, MD      . bisacodyl (DULCOLAX) suppository 10 mg  10 mg Rectal Once Laveda Norman, MD      . bisacodyl (DULCOLAX) suppository 10 mg  10 mg Rectal Daily PRN Laveda Norman, MD      . calcitonin (salmon) (MIACALCIN/FORTICAL) nasal spray 1 spray  1 spray Alternating Nares Daily Laveda Norman, MD   1 spray at 08/15/12 1002  . cefTRIAXone (ROCEPHIN) 1 g in dextrose 5 % 50 mL IVPB  1 g Intravenous Q24H Laveda Norman, MD   1 g at 08/15/12 1000  . darifenacin (ENABLEX) 24 hr tablet 7.5 mg  7.5 mg Oral Daily Laveda Norman, MD   7.5 mg at 08/15/12 1002  . furosemide (LASIX) injection 40 mg  40 mg Intravenous Once Zannie Cove, MD      . gabapentin (NEURONTIN) capsule 600 mg  600 mg Oral QHS  Laveda Norman, MD   600 mg at 08/15/12 2206  . insulin aspart (novoLOG) injection 0-5 Units  0-5 Units Subcutaneous QHS Laveda Norman, MD   2 Units at 08/15/12 2303  . insulin aspart (novoLOG) injection 0-9 Units  0-9 Units Subcutaneous TID WC Laveda Norman, MD   2 Units at 08/15/12 1730  . levothyroxine (SYNTHROID, LEVOTHROID) tablet 75 mcg  75 mcg Oral QAC breakfast Laveda Norman, MD   75 mcg at 08/15/12 [phone]  . metoprolol tartrate (LOPRESSOR) tablet 25 mg  25 mg Oral BID Zannie Cove, MD   25 mg at 08/15/12 2206  . morphine 2 MG/ML injection 2 mg  2 mg Intravenous Q4H PRN Laveda Norman, MD   2 mg at 08/15/12 1850  . multivitamin with minerals tablet 1 tablet  1 tablet Oral Daily Laveda Norman, MD   1 tablet at 08/15/12 1000  . ondansetron (ZOFRAN) tablet 4 mg  4 mg Oral Q6H PRN Laveda Norman, MD       Or  . ondansetron [facility]) injection 4 mg  4 mg Intravenous Q6H PRN Laveda Norman, MD      . pantoprazole (PROTONIX) injection 40 mg  40 mg Intravenous Q12H Zannie Cove, MD      . pregabalin (LYRICA) capsule 75 mg  75 mg Oral BID PRN Laveda Norman, MD   75 mg at 08/15/12 0556  . sodium chloride 0.9 % injection 3 mL  3 mL Intravenous Q12H Laveda Norman, MD   3 mL at 08/14/12 1530  . traMADol (ULTRAM) tablet 50 mg  50 mg Oral Q12H PRN Laveda Norman, MD      . Travoprost (BAK Free) (TRAVATAN) 0.004 % ophthalmic solution SOLN 1 drop  1 drop Left Eye QHS Laveda Norman, MD   1 drop at 08/15/12 2206  . vitamin C (ASCORBIC ACID) tablet 250 mg  250 mg Oral Daily Laveda Norman,  MD   250 mg at 08/15/12 1000  . zolpidem (AMBIEN) tablet 5 mg  5 mg Oral QHS PRN Laveda Norman, MD        Allergies as of 08/14/2012 - Review Complete 08/14/2012  Allergen Reaction Noted  . Noroxin (norfloxacin) Swelling 04/28/2011  . Sulfa antibiotics Nausea And Vomiting 04/28/2011  . Prozac (fluoxetine hcl) Rash 04/28/2011    Family History  Problem Relation Age of Onset  . Heart disease Mother   . Cancer Sister     History   Social  History  . Marital Status: Widowed    Spouse Name: N/A    Number of Children: 3  . Years of Education: N/A   Occupational History  . Retired     retired Nurse, learning disability   Social History Main Topics  . Smoking status: Never Smoker   . Smokeless tobacco: Never Used  . Alcohol Use: No  . Drug Use: No  . Sexually Active: No   Other Topics Concern  . Not on file   Social History Narrative  . No narrative on file    Review of Systems: Ten point ROS is O/W negative except as mentioned in HPI.  Physical Exam: Vital signs in last 24 hours: Temp:  [97.8 F (36.6 C)-98.8 F (37.1 C)] 97.8 F (36.6 C) (06/23 0630) Pulse Rate:  [69-86] 70 (06/23 0630) Resp:  [16-22] 16 (06/23 0630) BP: (111-123)/(41-61) 115/61 mmHg (06/23 0630) SpO2:  [98 %-99 %] 99 % (06/23 0630) Last BM Date: 08/15/12 General:   Alert, elderly white female, pleasantly demented, in NAD Head:  Normocephalic and atraumatic. Eyes:  Sclera clear, no icterus.  Conjunctiva pale. Ears:  Normal auditory acuity. Mouth:  No deformity or lesions.   Lungs:  Clear throughout to auscultation.  No wheezes, crackles, or rhonchi.  Heart:  Regular rate and rhythm; no murmurs, clicks, rubs,  or gallops. Abdomen:  Soft, NDNT, BS present.  Rectal:  Deferred.  Heme positive in ED.  Msk:  Symmetrical without gross deformities. Pulses:  Normal pulses noted. Extremities:  Slight edema in B/L LE's. Neurologic:  Alert; grossly normal neurologically. Skin:  Intact without significant lesions or rashes.  Intake/Output from previous day: 06/22 0701 - 06/23 0700 In: 1877.5 [P.O.:540; I.V.:1287.5; IV Piggyback:50] Out: -   Lab Results:  Recent Labs  08/15/12 0100 08/15/12 0922 08/16/12 0444  WBC 10.3 11.0* 10.9*  HGB 6.1* 7.6* 6.7*  HCT 18.1* 22.2* 20.0*  PLT 225 218 195   BMET  Recent Labs  08/14/12 1215 08/15/12 0922 08/16/12 0444  NA 137 136 141  K 5.8* 4.3 3.8  CL 103 104 108  CO2 20 22 27   GLUCOSE 180*  217* 166*  BUN 53* 57* 49*  CREATININE 1.41* 1.15* 1.11*  CALCIUM 11.3* 9.8 9.5   LFT  Recent Labs  08/15/12 0922  PROT 5.5*  ALBUMIN 2.8*  AST 154*  ALT 175*  ALKPHOS 46  BILITOT 0.5   PT/INR  Recent Labs  08/14/12 1501 08/15/12 0922  LABPROT 60.8* 31.4*  INR 7.94* 3.25*    Studies/Results: Ct Head Wo Contrast  08/14/2012   *RADIOLOGY REPORT*  Clinical Data: Altered mental status and fatigue.  CT HEAD WITHOUT CONTRAST  Technique:  Contiguous axial images were obtained from the base of the skull through the vertex without contrast.  Comparison: 12/07/2010  Findings: Stable advanced small vessel ischemic changes in the periventricular white matter and diffuse cortical atrophy.  The brain demonstrates no evidence of  hemorrhage, infarction, edema, mass effect, extra-axial fluid collection, hydrocephalus or mass lesion.  The skull is unremarkable.  IMPRESSION: Stable atrophy and small vessel disease.  No acute findings.   Original Report Authenticated By: Irish Lack, M.D.   Dg Chest Port 1 View  08/14/2012   *RADIOLOGY REPORT*  Clinical Data: Acute mental status changes and shortness of breath.  PORTABLE CHEST - 1 VIEW  Comparison: 05/06/2011  Findings: Lung volumes are low bilaterally with bibasilar atelectasis present.  No overt edema or pulmonary consolidation is identified.  Heart size is within normal limits following prior CABG.  No significant pleural fluid is identified.  IMPRESSION: Low lung volumes with bibasilar atelectasis.   Original Report Authenticated By: Irish Lack, M.D.    IMPRESSION:  -Acute/subacute drop in Hgb of 9 grams over past 2 months and heme positive stools in the setting of anticoagulation with Pradaxa.  No overt bleeding.  S/P 3 units of PRBC's and currently receiving a 4th unit. -Atrial fibrillation with chronic anticoagulation with pradaxa. -Demand ischemia with bump in troponins secondary to profound anemia.  ECHO ok and cards said no further  intervention or work-up from their standpoint. -Transaminitis:  LFT's normal yesterday, but AST and ALT elevated today.  Likely secondary to some hypoperfusion.  Would monitor. -UTI:  On abx. -Worsening confusion/change in MS:  Secondary to anemia and UTI. -Several other chronic medical problems.  PLAN: -Agree with transfusion and continued monitoring of Hgb. -Will check CT scan of the abdomen and pelvis without contrast to rule retroperitoneal hematoma/bleed.  If this is negative then we will plan for EGD tomorrow, so I have tentatively place her NPO after mid-night. -Continue BID PPI for now.   ZEHR, JESSICA D.  08/16/2012, 8:38 AM  Pager number [phone]  GI ATTENDING  History, laboratories, x-rays reviewed. Patient personally seen and examined. Also, discussed with her son. Agree with H&P as outlined above. Patient presents with weakness and confusion. Found to have significant interval anemia. Stools Hemoccult positive, though not grossly bloody. No history to suggest GI bleed. Has been on Pradaxa. No overt bruising. Head CT negative. Last colonoscopy 2005, apparently unremarkable. Anemia panel normal. Elevated LFTs since admission noted. Exam unrevealing. Dementia present. We'll plan on noncontrast CT of the abdomen and pelvis to rule out retroperitoneal bleed. Pradaxa has been held. Also checking haptoglobin level, to rule out hemolysis. If CT negative, we'll plan on diagnostic upper endoscopy to rule out significant upper GI mucosal lesion. Discussed with the patient and her son.  Wilhemina Bonito. Eda Keys., M.D. [facility] Division of Gastroenterology

## 2012-08-17 ENCOUNTER — Encounter (HOSPITAL_COMMUNITY)
Admission: EM | Disposition: A | Discharge status: Skilled Nursing Facility | Payer: Self-pay | Source: Ambulatory Visit | Attending: Internal Medicine

## 2012-08-17 ENCOUNTER — Encounter (HOSPITAL_COMMUNITY): Payer: Self-pay | Admitting: *Deleted

## 2012-08-17 DIAGNOSIS — K2970 Gastritis, unspecified, without bleeding: Secondary | ICD-10-CM

## 2012-08-17 DIAGNOSIS — K5520 Angiodysplasia of colon without hemorrhage: Secondary | ICD-10-CM

## 2012-08-17 DIAGNOSIS — I248 Other forms of acute ischemic heart disease: Secondary | ICD-10-CM

## 2012-08-17 HISTORY — PX: ESOPHAGOGASTRODUODENOSCOPY: SHX5428

## 2012-08-17 HISTORY — PX: HOT HEMOSTASIS: SHX5433

## 2012-08-17 LAB — HAPTOGLOBIN: Haptoglobin: 119 mg/dL (ref 45–215)

## 2012-08-17 LAB — BASIC METABOLIC PANEL
Calcium: 9.8 mg/dL (ref 8.4–10.5)
GFR calc Af Amer: 47 mL/min — ABNORMAL LOW (ref >90)
GFR calc non Af Amer: 41 mL/min — ABNORMAL LOW (ref >90)
Sodium: 139 mEq/L (ref 135–145)

## 2012-08-17 LAB — GLUCOSE, CAPILLARY
Glucose-Capillary: 240 mg/dL — ABNORMAL HIGH (ref 70–99)
Glucose-Capillary: 169 mg/dL — ABNORMAL HIGH (ref 70–99)
Glucose-Capillary: 206 mg/dL — ABNORMAL HIGH (ref 70–99)

## 2012-08-17 LAB — CBC
MCH: 29.5 pg (ref 26.0–34.0)
MCHC: 33.2 g/dL (ref 30.0–36.0)
Platelets: 170 10*3/uL (ref 150–400)
RDW: 15.6 % — ABNORMAL HIGH (ref 11.5–15.5)

## 2012-08-17 SURGERY — EGD (ESOPHAGOGASTRODUODENOSCOPY)
Anesthesia: Moderate Sedation

## 2012-08-17 MED ORDER — BUTAMBEN-TETRACAINE-BENZOCAINE 2-2-14 % EX AERO
INHALATION_SPRAY | CUTANEOUS | Status: DC | PRN
  Administered 2012-08-17: 2 via TOPICAL

## 2012-08-17 MED ORDER — MIDAZOLAM HCL 10 MG/2ML IJ SOLN
INTRAMUSCULAR | Status: DC | PRN
  Administered 2012-08-17: 1 mg via INTRAVENOUS
  Administered 2012-08-17: .5 mg via INTRAVENOUS
  Administered 2012-08-17: 1 mg via INTRAVENOUS

## 2012-08-17 MED ORDER — DEXTROSE 5 % IV SOLN
1.0000 g | INTRAVENOUS | Status: AC
  Administered 2012-08-17 – 2012-08-18 (×2): 1 g via INTRAVENOUS
  Filled 2012-08-17 (×2): qty 10

## 2012-08-17 NOTE — Progress Notes (Signed)
Inpatient Diabetes Program Recommendations  AACE/ADA: New Consensus Statement on Inpatient Glycemic Control (2013)  Target Ranges:  Prepandial:   less than 140 mg/dL      Peak postprandial:   less than 180 mg/dL (1-2 hours)      Critically ill patients:  140 - 180 mg/dL   Reason for Visit: Hyperglycemia   Results for [name], [name] (MRN [id number]) as of 08/17/2012 15:43  Ref. Range 08/16/2012 07:30 08/16/2012 11:56 08/16/2012 17:01 08/16/2012 21:29 08/17/2012 07:43  Glucose-Capillary Latest Range: 70-99 mg/dL 956 (H) 213 (H) 086 (H) 240 (H) 169 (H)     Consider meal coverage insulin - Novolog 3 units tidwc if pt eats >50% meal.  Will continue to follow.  Thank you. Ailene Ards, RD, LDN, CDE Inpatient Diabetes Coordinator [phone]

## 2012-08-17 NOTE — Op Note (Addendum)
[facility] [address] [place], [address]   ENDOSCOPY PROCEDURE REPORT  PATIENT: Tammy, [name]  MR#: [id number] BIRTHDATE: Nov 14, 1916 , 89  yrs. old GENDER: Female ENDOSCOPIST: Roxy Cedar, MD REFERRED BY:  [facility] PROCEDURE DATE:  08/17/2012 PROCEDURE:  EGD w/ control of bleeding and EGD w/ biopsies ASA CLASS:     Class III INDICATIONS:  Heme positive stool.  Anemia (marked) MEDICATIONS: Versed 2.5 mg IV TOPICAL ANESTHETIC: Cetacaine Spray  DESCRIPTION OF PROCEDURE: After the risks benefits and alternatives of the procedure were thoroughly explained, informed consent was obtained.  The Pentax Gastroscope [id number] endoscope was introduced through the mouth and advanced to the third portion of the duodenum. Without limitations.  The instrument was slowly withdrawn as the mucosa was fully examined.      EXAM: Normal esophagus.  Stomach with 4 cm area of irregular mucosa (slightly nodular, friable, few linear serpigenous ulcers) on the greater curve.  Biopsied.  Duodenal bulb with significantly bleeding AVM.  Normal postbulbar duodenum.  Retroflexed views revealed no abnormalities.  THERAPY: END SHOOTING APC USED TO ABLATE AVM AND ACHIEVE HEMOSTASIS. The scope was then withdrawn from the patient and the procedure completed.  COMPLICATIONS: There were no complications. ENDOSCOPIC IMPRESSION: 1. Stomach with 4 cm area of irregular mucosa. Biopsied. 2. Duodenal bulb with significantly bleeding AVM. S/P Endoscopic hemostasis.  RECOMMENDATIONS: 1.  Continue PPI 2.  Await biopsy results 3. Observe for rebleed (H/H, watch stools), transfuse for Hg < 8, clear liquids for now DISCUSSED WITH SON. WILL FOLLOW  REPEAT EXAM:  eSigned:  Roxy Cedar, MD 08/17/2012 10:48 AM GN:FAOZHY Chilton Si, MD and The Patient

## 2012-08-17 NOTE — Progress Notes (Addendum)
[facility] PROGRESS NOTE  HAILEY STORMER JWJ:[id number] DOB: 07/31/1916 DOA: 08/14/2012 PCP: Kimber Relic, MD  Brief Narrative 89/F with remote CABG, Afib on Pradaxa, DM admitted from home with Severe anemia, Hb of 4 from baseline 11-13g/dl 3 months ago, heme positive stools and demand ischemia. Demand ischemia treated with medical management only, echo with normal wall motion. Interms of her anemia, transfused 4 units PRBC and seen by [facility], CT abd pelvis yesterday benign,  for EGD today.  Assessment/Plan: 1. Acute on chronic blood loss anemia: -heme positive, significant drop in Hb from baseline 11-13 -Transfused 3 units PRBC 6/21, 1 more unit 6/23 -with demand ischemia from severe anemia -Appreciate GI eval -Pradaxa on hold -h/o unremarkable colonoscopy 9 years ago -EGD today  2. Demand Ischemia -cardiac enzymes, troponin starting to trend down, never had any chest pain -continue metoprolol, unable to add ASA or Heparin in setting of 1 -Not a candidate for cath or invasive intervention due to age, dementia and GI bleed, this was explained to son in detail,  cards on call Dr.Ross who agrees with this as well -2D ECHO with Ef of 45-50% and no regional wall motion abnormalities.  3. Klebsiella UTI:  - continue  iv Rocephin Day4 for 1 more day for 5 days total   4. Atrial fibrillation:  -Patient has known atrial fibrillation, and was on Pradaxa pre-admission. Currently rate-controlled. As discussed above, Pradaxa has been discontinued for now.   5. HTN (hypertension):  -stable, cut down IVF  6. Diabetes mellitus:  -metformin on hold, SSI   7. CKD (chronic kidney disease), stage III: Creatinine improved with hydration  8. Dementia/Altered mental status: Patient has known dementia, which according to PMD's office notes in EMR, has been gradually worsening. This is the likely etiology for her intermittent confusion and hallucinosis, although current acute  issues, are no-doubt contributory to acute worsening. TSH and B12 normal   8. Hypothyroidism: Thyroxine replacement therapy continued.   PT/OT  Code Status: DNR Family Communication:  d/w son Disposition Plan: to be determined, Pt/Ot eval today   Antibiotics:  CEftraixone 6/21  HPI/Subjective: No new events, feels ok  Objective: Filed Vitals:   08/17/12 1035 08/17/12 1040 08/17/12 1050 08/17/12 1100  BP: 110/46 110/46 111/43 106/43  Pulse:      Temp:      TempSrc:      Resp: 18 13 17 20   Height:      Weight:      SpO2: 94% 94% 94% 93%    Intake/Output Summary (Last 24 hours) at 08/17/12 1224 Last data filed at 08/16/12 2141  Gross per 24 hour  Intake  265.5 ml  Output      0 ml  Net  265.5 ml   Filed Weights   08/14/12 1530  Weight: 75.751 kg (167 lb)    Exam:   General:  AA oriented to self and place, intermittently confused  Cardiovascular: S1S2/RRR  Respiratory: Diminished at bases  Abdomen: soft, Nt, BS present  Musculoskeletal: trace edema    Data Reviewed: Basic Metabolic Panel:  Recent Labs Lab 08/14/12 1215 08/15/12 0922 08/16/12 0444 08/17/12 0510  NA 137 136 141 139  K 5.8* 4.3 3.8 4.1  CL 103 104 108 105  CO2 20 22 27 29   GLUCOSE 180* 217* 166* 173*  BUN 53* 57* 49* 42*  CREATININE 1.41* 1.15* 1.11* 1.13*  CALCIUM 11.3* 9.8 9.5 9.8   Liver Function Tests:  Recent Labs Lab 08/14/12 1215  08/15/12 0922  AST 20 154*  ALT 12 175*  ALKPHOS 60 46  BILITOT 0.2* 0.5  PROT 6.5 5.5*  ALBUMIN 3.4* 2.8*   No results found for this basename: LIPASE, AMYLASE,  in the last 168 hours No results found for this basename: AMMONIA,  in the last 168 hours CBC:  Recent Labs Lab 08/14/12 1215 08/15/12 0100 08/15/12 0922 08/16/12 0444 08/17/12 0510  WBC 12.0* 10.3 11.0* 10.9* 9.1  NEUTROABS 9.1*  --   --   --   --   HGB 4.0* 6.1* 7.6* 6.7* 7.4*  HCT 13.2* 18.1* 22.2* 20.0* 22.3*  MCV 91.7 85.4 86.7 88.1 88.8  PLT 354 225 218 195  170   Cardiac Enzymes:  Recent Labs Lab 08/14/12 1548 08/15/12 0100 08/15/12 0922 08/16/12 0444  TROPONINI 0.43* 0.95* 1.08* 0.86*   BNP (last 3 results) No results found for this basename: PROBNP,  in the last 8760 hours CBG:  Recent Labs Lab 08/16/12 0730 08/16/12 1156 08/16/12 1701 08/16/12 2129 08/17/12 0743  GLUCAP 164* 259* 344* 240* 169*    Recent Results (from the past 240 hour(s))  URINE CULTURE     Status: None   Collection Time    08/14/12 10:30 AM      Result Value Range Status   Specimen Description URINE, CATHETERIZED   Final   Special Requests NONE   Final   Culture  Setup Time 08/14/2012 21:14   Final   Colony Count >=100,000 COLONIES/ML   Final   Culture KLEBSIELLA PNEUMONIAE   Final   Report Status 08/16/2012 FINAL   Final   Organism ID, Bacteria KLEBSIELLA PNEUMONIAE   Final     Studies: Ct Abdomen Pelvis Wo Contrast  08/16/2012   *RADIOLOGY REPORT*  Clinical Data: Large drop in hemoglobin.  Evaluate for retroperitoneal bleed.  Urinary tract infection with confusion.  CT ABDOMEN AND PELVIS WITHOUT CONTRAST  Technique:  Multidetector CT imaging of the abdomen and pelvis was performed following the standard protocol without intravenous contrast.  Comparison: Lumbar spine MRI 08/17/2009.  Findings: There is atelectasis or scarring in both lung bases. There is some pleural thickening on the left.  No significant pleural or pericardial effusion is present.  There is diffuse atherosclerosis status post median sternotomy.  There is no evidence of retroperitoneal hematoma or hemoperitoneum. Both inguinal regions appear normal.  As evaluated in the noncontrast state, the liver, spleen and adrenal glands appear normal.  The gallbladder is contracted without demonstrated abnormality.  There is diffuse pancreatic atrophy and fatty replacement.  Both kidneys demonstrate mild cortical thinning.  There may be a tiny calculus in the lower pole of the left kidney.  A small  low-density lesion in the upper pole of the right kidney is likely a cyst.  There is no hydronephrosis.  The stomach, small bowel and colon demonstrate no focal abnormalities.  Stool is present throughout the colon.  There is aorto iliac atherosclerosis.  The uterus is surgically absent.  The bladder appears normal.  Multiple compression fractures are demonstrated throughout the spine status post spinal augmentation from T12-L3. Since the previous MRI, there is a progressive compression deformity at L4 involving both endplates.  This demonstrates no definite acute features.  L5 appears intact.  Old pelvic fractures are present bilaterally.  IMPRESSION:  1.  No evidence of retroperitoneal hematoma or hemoperitoneum. 2.  No definite acute abdominal pelvic findings. 3.  Multiple thoracolumbar compression deformities with progressive loss of height at L4 compared with  lumbar MRI 08/17/2009.  No specific features of an acute fracture are identified.   Original Report Authenticated By: Carey Bullocks, M.D.    Scheduled Meds: . bisacodyl  10 mg Rectal Once  . calcitonin (salmon)  1 spray Alternating Nares Daily  . cefTRIAXone (ROCEPHIN)  IV  1 g Intravenous Q24H  . darifenacin  7.5 mg Oral Daily  . feeding supplement  237 mL Oral TID WC  . gabapentin  600 mg Oral QHS  . insulin aspart  0-5 Units Subcutaneous QHS  . insulin aspart  0-9 Units Subcutaneous TID WC  . levothyroxine  75 mcg Oral QAC breakfast  . metoprolol tartrate  25 mg Oral BID  . multivitamin with minerals  1 tablet Oral Daily  . pantoprazole (PROTONIX) IV  40 mg Intravenous Q12H  . sodium chloride  3 mL Intravenous Q12H  . Travoprost (BAK Free)  1 drop Left Eye QHS  . vitamin C  250 mg Oral Daily   Continuous Infusions:    Active Problems:   GI bleed   HTN (hypertension)   Diabetes mellitus   CKD (chronic kidney disease), stage III   Dementia   Altered mental status   UTI (lower urinary tract infection)   Demand ischemia   GI  AVM (gastrointestinal arteriovenous vascular malformation)   Unspecified gastritis and gastroduodenitis without mention of hemorrhage    Time spent:    [facility]  [facility] Pager [phone]. If 7PM-7AM, please contact night-coverage at [email or web address], password [facility] 08/17/2012, 12:24 PM  LOS: 3 days

## 2012-08-17 NOTE — Progress Notes (Signed)
[facility] Progress Note   Subjective  No specific complaints  Objective   Vital signs in last 24 hours: Temp:  [98.4 F (36.9 C)-98.8 F (37.1 C)] 98.4 F (36.9 C) (06/24 0658) Pulse Rate:  [68-73] 68 (06/24 0658) Resp:  [16-32] 16 (06/24 0658) BP: (102-119)/(39-55) 102/55 mmHg (06/24 0658) SpO2:  [97 %-100 %] 99 % (06/24 0658) Last BM Date: 08/16/12 General:    Pleasant white female in NAD Heart:  Regular rate, irregular rhythm.  Abdomen:  Soft, nontender and nondistended. Normal bowel sounds. Extremities:  Without edema. psych:  Cooperative. Normal mood and affect.  Lab Results:  Recent Labs  08/15/12 0922 08/16/12 0444 08/17/12 0510  WBC 11.0* 10.9* 9.1  HGB 7.6* 6.7* 7.4*  HCT 22.2* 20.0* 22.3*  PLT 218 195 170   BMET  Recent Labs  08/15/12 0922 08/16/12 0444 08/17/12 0510  NA 136 141 139  K 4.3 3.8 4.1  CL 104 108 105  CO2 22 27 29   GLUCOSE 217* 166* 173*  BUN 57* 49* 42*  CREATININE 1.15* 1.11* 1.13*  CALCIUM 9.8 9.5 9.8   LFT  Recent Labs  08/15/12 0922  PROT 5.5*  ALBUMIN 2.8*  AST 154*  ALT 175*  ALKPHOS 46  BILITOT 0.5   PT/INR  Recent Labs  08/14/12 1501 08/15/12 0922  LABPROT 60.8* 31.4*  INR 7.94* 3.25*    Studies/Results: Ct Abdomen Pelvis Wo Contrast  08/16/2012   *RADIOLOGY REPORT*  Clinical Data: Large drop in hemoglobin.  Evaluate for retroperitoneal bleed.  Urinary tract infection with confusion.  CT ABDOMEN AND PELVIS WITHOUT CONTRAST  Technique:  Multidetector CT imaging of the abdomen and pelvis was performed following the standard protocol without intravenous contrast.  Comparison: Lumbar spine MRI 08/17/2009.  Findings: There is atelectasis or scarring in both lung bases. There is some pleural thickening on the left.  No significant pleural or pericardial effusion is present.  There is diffuse atherosclerosis status post median sternotomy.  There is no evidence of retroperitoneal hematoma or  hemoperitoneum. Both inguinal regions appear normal.  As evaluated in the noncontrast state, the liver, spleen and adrenal glands appear normal.  The gallbladder is contracted without demonstrated abnormality.  There is diffuse pancreatic atrophy and fatty replacement.  Both kidneys demonstrate mild cortical thinning.  There may be a tiny calculus in the lower pole of the left kidney.  A small low-density lesion in the upper pole of the right kidney is likely a cyst.  There is no hydronephrosis.  The stomach, small bowel and colon demonstrate no focal abnormalities.  Stool is present throughout the colon.  There is aorto iliac atherosclerosis.  The uterus is surgically absent.  The bladder appears normal.  Multiple compression fractures are demonstrated throughout the spine status post spinal augmentation from T12-L3. Since the previous MRI, there is a progressive compression deformity at L4 involving both endplates.  This demonstrates no definite acute features.  L5 appears intact.  Old pelvic fractures are present bilaterally.  IMPRESSION:  1.  No evidence of retroperitoneal hematoma or hemoperitoneum. 2.  No definite acute abdominal pelvic findings. 3.  Multiple thoracolumbar compression deformities with progressive loss of height at L4 compared with lumbar MRI 08/17/2009.  No specific features of an acute fracture are identified.   Original Report Authenticated By: Carey Bullocks, M.D.      Assessment / Plan:   1. Normocytic anemia with drop in Hgb (on Pradaxa) from baseline of 11-12 to 4 without any overt bleeding.  Ferritin 17. Heme positive stools. CTscan negative for retroperitoneal bleed. Haptoglobin pending. Will proceed with EGD today.  Hgb up to 7.4 post transfusion.  2. Elevated INR, likely from Pradaxa though INR cannot be used to guide therapy.  3. Elevated troponins, ? Demand ischemia. Numbers trending down.   4. Transaminitis, ? No definite etiology yet determined. Will recheck in am.    5. Multiple thorocolumbar compresson deformities without acute features on CTscan  6. Afib, on pradaxa at home.     LOS: 3 days   Willette Cluster  08/17/2012, 9:21 AM   GI ATTENDING  Interval laboratories and x-ray reviewed. Patient seen and examined. Agree with H&P as outlined above. We will proceed with upper endoscopy, as CT is negative. Rule out upper GI lesion to account for bleeding in the face of Pradaxa therapy.The nature of the procedure, as well as the risks, benefits, and alternatives were carefully and thoroughly reviewed with the patient. Ample time for discussion and questions allowed. The patient understood, was satisfied, and agreed to proceed.  Wilhemina Bonito. Eda Keys., M.D. [facility] Division of Gastroenterology

## 2012-08-18 ENCOUNTER — Encounter (HOSPITAL_COMMUNITY): Payer: Self-pay | Admitting: Internal Medicine

## 2012-08-18 ENCOUNTER — Encounter: Payer: Self-pay | Admitting: Internal Medicine

## 2012-08-18 LAB — TYPE AND SCREEN
ABO/RH(D): A POS
Antibody Screen: NEGATIVE
Unit division: 0
Unit division: 0

## 2012-08-18 LAB — CBC
Hemoglobin: 7.9 g/dL — ABNORMAL LOW (ref 12.0–15.0)
MCH: 29.5 pg (ref 26.0–34.0)
MCHC: 32.5 g/dL (ref 30.0–36.0)
Platelets: 180 10*3/uL (ref 150–400)
RDW: 15.7 % — ABNORMAL HIGH (ref 11.5–15.5)

## 2012-08-18 LAB — GLUCOSE, CAPILLARY
Glucose-Capillary: 148 mg/dL — ABNORMAL HIGH (ref 70–99)
Glucose-Capillary: 272 mg/dL — ABNORMAL HIGH (ref 70–99)
Glucose-Capillary: 247 mg/dL — ABNORMAL HIGH (ref 70–99)

## 2012-08-18 LAB — BASIC METABOLIC PANEL
Calcium: 10 mg/dL (ref 8.4–10.5)
GFR calc non Af Amer: 52 mL/min — ABNORMAL LOW (ref >90)
Glucose, Bld: 213 mg/dL — ABNORMAL HIGH (ref 70–99)
Sodium: 139 mEq/L (ref 135–145)

## 2012-08-18 LAB — HEPATIC FUNCTION PANEL
Albumin: 2.8 g/dL — ABNORMAL LOW (ref 3.5–5.2)
Total Bilirubin: 0.2 mg/dL — ABNORMAL LOW (ref 0.3–1.2)

## 2012-08-18 MED ORDER — FERROUS SULFATE 325 (65 FE) MG PO TABS
325.0000 mg | ORAL_TABLET | Freq: Two times a day (BID) | ORAL | Status: DC
  Administered 2012-08-18 – 2012-08-19 (×2): 325 mg via ORAL
  Filled 2012-08-18 (×4): qty 1

## 2012-08-18 NOTE — Progress Notes (Signed)
Pt is very up set with Primary Rn for receiving medication at 22:30. I informed the Pt that her medications are scheduled  at 22:00 so 30 min after is not considered late and per [facility] policy. She became very  bothered  when Rn tried to explain her medication time schedule, and what she would be receiving and why. She stated "No one else as shown me my meds and told me why I have to take them." The Pt states that because of this she is unhappy with her care and would not like to return to [facility]. Charge RN Nehemiah Settle made aware. Will continue to monitor, reassure, and educate the Pt and will  pass on to the day shift RN that the Pt might benefit from receiving her meds first when starting a med pass.

## 2012-08-18 NOTE — Evaluation (Signed)
Physical Therapy Evaluation Patient Details Name: [name] MRN: [id number] DOB: 12-01-1916 Today's Date: 08/18/2012 Time: 4098-1191 PT Time Calculation (min): 37 min  PT Assessment / Plan / Recommendation History of Present Illness  Pt with present illness of GIB.  Hx of dementia, lives with son and is mostly w/c or chair bound.  Son assists pt to chair via stand step transfer.   Clinical Impression  Pt presents with GIB and history of dementia.  Tolerated OOB to chair, however with +2 assist at this time using RW.  Pt will benefit from skilled PT in acute venue to address deficits.  Discussed D/C plans with son and he feels that he will be able to manage pt at home with assist from [facility].  PT recommends HHPT for follow up and 24/7 supervision/assist.     PT Assessment  Patient needs continued PT services    Follow Up Recommendations  Home health PT;Supervision/Assistance - 24 hour    Does the patient have the potential to tolerate intense rehabilitation      Barriers to Discharge        Equipment Recommendations  None recommended by PT    Recommendations for Other Services     Frequency Min 3X/week    Precautions / Restrictions Precautions Precautions: Fall Restrictions Weight Bearing Restrictions: No   Pertinent Vitals/Pain none      Mobility  Bed Mobility Bed Mobility: Supine to Sit Rolling Right: 3: Mod assist Right Sidelying to Sit: 2: Max assist;With rails Supine to Sit: 2: Max assist;HOB elevated;With rails Details for Bed Mobility Assistance: Assist for LEs out of bed and for trunk to attain sitting position.  Pt with severe posterior lean intially, however with B feet on floor, sitting balance improved.  Max cues for technqiue and posture.  Transfers Transfers: Sit to Stand;Stand to Sit;Stand Pivot Transfers Sit to Stand: 1: +2 Total assist;From elevated surface;With upper extremity assist;From bed Sit to Stand: Patient Percentage: 40% Stand to Sit:  1: +2 Total assist;With upper extremity assist;With armrests;To chair/3-in-1 Stand to Sit: Patient Percentage: 40% Stand Pivot Transfers: 1: +2 Total assist Stand Pivot Transfers: Patient Percentage: 40% Details for Transfer Assistance: Performed 3 reps in order to assist with self care with +2 assist to rise, steady and ensure safety when sitting.  Note that pt requires increased cuing for glute and hip ext activation when standing as she demos posterior leaning in standing as well.  she was able to take several steps from bed to chair with max cues for sequencing/technique with RW.  Ambulation/Gait Ambulation/Gait Assistance: Not tested (comment) Assistive device: Rolling walker Stairs: No Wheelchair Mobility Wheelchair Mobility: No    Exercises General Exercises - Lower Extremity Ankle Circles/Pumps: AROM;Both;20 reps Heel Slides: Strengthening;Both;10 reps Hip ABduction/ADduction: Strengthening;Both;10 reps Straight Leg Raises: Strengthening;Both;10 reps   PT Diagnosis: Difficulty walking;Generalized weakness;Acute pain  PT Problem List: Decreased strength;Decreased range of motion;Decreased activity tolerance;Decreased balance;Decreased mobility;Decreased coordination;Decreased cognition;Decreased knowledge of use of DME;Decreased safety awareness;Decreased knowledge of precautions PT Treatment Interventions: DME instruction;Gait training;Functional mobility training;Therapeutic activities;Therapeutic exercise;Balance training;Patient/family education     PT Goals(Current goals can be found in the care plan section) Acute Rehab PT Goals Patient Stated Goal: Per son, to get her legs stronger PT Goal Formulation: With patient/family Time For Goal Achievement: 09/01/12 Potential to Achieve Goals: Fair  Visit Information  Last PT Received On: 08/18/12 Assistance Needed: +2 History of Present Illness: Pt with present illness of GIB.  Hx of dementia, lives with son and  is mostly w/c  or chair bound.  Son assists pt to chair via stand step transfer.        Prior Functioning  Home Living Family/patient expects to be discharged to:: Private residence Living Arrangements: Children Available Help at Discharge: Family;Available 24 hours/day;Personal care attendant Type of Home: House Home Access: Ramped entrance Home Layout: One level Home Equipment: Walker - 2 wheels;Wheelchair - manual;Shower seat Additional Comments: pt is w/c bound.  Has 3:1 commode per pt Prior Function Level of Independence: Needs assistance ADL's / Homemaking Assistance Needed: assist as needed with adls Comments: Per son, he was assisting her into shower and HH aide was assisting pt with her bathing and dressing.  Son was assisting via stand step transfer from bed to w/c.  Communication Communication: No difficulties    Cognition  Cognition Arousal/Alertness: Awake/alert Behavior During Therapy: WFL for tasks assessed/performed Overall Cognitive Status: History of cognitive impairments - at baseline (some memory difficulties: oriented) Memory: Decreased short-term memory    Extremity/Trunk Assessment Upper Extremity Assessment Upper Extremity Assessment: Generalized weakness Lower Extremity Assessment Lower Extremity Assessment: Generalized weakness Cervical / Trunk Assessment Cervical / Trunk Assessment: Kyphotic   Balance Balance Balance Assessed: Yes Static Sitting Balance Static Sitting - Balance Support: Bilateral upper extremity supported Static Sitting - Level of Assistance: 4: Min assist;3: Mod assist Static Sitting - Comment/# of Minutes: 2 minutes  End of Session PT - End of Session Equipment Utilized During Treatment: Gait belt Activity Tolerance: Patient limited by fatigue Patient left: in chair;with call bell/phone within reach;with family/visitor present Nurse Communication: Mobility status  GP     Vista Deck 08/18/2012, 4:37 PM

## 2012-08-18 NOTE — Evaluation (Addendum)
Occupational Therapy Evaluation Patient Details Name: [name] MRN: [id number] DOB: 18-May-1916 Today's Date: 08/18/2012 Time: 4098-1191 OT Time Calculation (min): 23 min  OT Assessment / Plan / Recommendation History of present illness admitted with GI bleed.  3 week h/o progressive weakness and confusion.  Pt is bed and w/cbound   Clinical Impression   This 79 year old female was living at home with son and had assist with transfers to commode, w/c, and bed.  Son would help with adls as needed.  Pt was admitted with GI bleed and would benefit from continued OT with goals of one person assist to hopefully return home with son.      OT Assessment  Patient needs continued OT Services    Follow Up Recommendations  SNF;Supervision/Assistance - 24 hour with HHOT (vs, depending upon progress. )    Barriers to Discharge      Equipment Recommendations  None recommended by OT    Recommendations for Other Services    Frequency  Min 2X/week    Precautions / Restrictions Precautions Precautions: Fall Restrictions Weight Bearing Restrictions: No   Pertinent Vitals/Pain No c/o pain    ADL  Eating/Feeding: Set up Where Assessed - Eating/Feeding: Bed level Grooming: Set up Where Assessed - Grooming: Supine, head of bed up Upper Body Bathing: Supervision/safety Where Assessed - Upper Body Bathing: Supine, head of bed up Lower Body Bathing: +2 Total assistance Lower Body Bathing: Patient Percentage: 20% Where Assessed - Lower Body Bathing: Rolling right and/or left Upper Body Dressing: Minimal assistance Where Assessed - Upper Body Dressing: Supine, head of bed up Lower Body Dressing: +2 Total assistance Lower Body Dressing: Patient Percentage: 0% Where Assessed - Lower Body Dressing: Rolling right and/or left Transfers/Ambulation Related to ADLs: sat eob with max A to get to eob and min to mod A to maintain for 2 minutes:  fatiques easily Spoke to son after evaluation:  I saw  him enter the room when I was documentingsel   OT Diagnosis: Generalized weakness  OT Problem List: Decreased strength;Decreased activity tolerance;Impaired balance (sitting and/or standing);Decreased cognition OT Treatment Interventions:   Self-care, therapeutic activities;balance; pt/family education  OT Goals(Current goals can be found in the care plan section) Acute Rehab OT Goals Patient Stated Goal: none stated.   OT Goal Formulation: With patient/family Time For Goal Achievement: 09/01/12 Potential to Achieve Goals: Good ADL Goals Pt Will Transfer to Toilet: with mod assist;stand pivot transfer Additional ADL Goal #1: Pt will stand x 1 minute with mod A for adls Additional ADL Goal #2: pt will sit eob x 3 minutes with supervision in prep for transfer to 3:1  Visit Information  Last OT Received On: 08/18/12 Assistance Needed: +2 History of Present Illness: admitted with GI bleed.  3 week h/o progressive weakness and confusion.  Pt is bed and w/cbound       Prior Functioning     Home Living Family/patient expects to be discharged to:: Unsure Living Arrangements: Children Additional Comments: pt is w/c bound.  Has 3:1 commode per pt Prior Function Level of Independence: Needs assistance ADL's / Homemaking Assistance Needed: assist as needed with adls Communication Communication: No difficulties         Vision/Perception     Cognition  Cognition Arousal/Alertness: Awake/alert Behavior During Therapy: WFL for tasks assessed/performed Overall Cognitive Status: History of cognitive impairments - at baseline (some memory difficulties: oriented) Memory: Decreased short-term memory    Extremity/Trunk Assessment Upper Extremity Assessment Upper Extremity Assessment: Generalized  weakness     Mobility Bed Mobility Bed Mobility: Rolling Left Rolling Right: 3: Mod assist Right Sidelying to Sit: 2: Max assist;With rails Details for Bed Mobility Assistance: used  rails.  assist to fully roll to R side     Exercise     Balance Balance Balance Assessed: Yes Static Sitting Balance Static Sitting - Balance Support: Bilateral upper extremity supported Static Sitting - Level of Assistance: 4: Min assist;3: Mod assist Static Sitting - Comment/# of Minutes: 2 minutes   End of Session OT - End of Session Activity Tolerance: Patient limited by fatigue Patient left: in bed;with call bell/phone within reach;with bed alarm set  GO     [name] 08/18/2012, 3:03 PM Marica Otter, OTR/L [phone] 08/18/2012

## 2012-08-18 NOTE — Progress Notes (Signed)
[facility] PROGRESS NOTE  [name] ZOX:[id number] DOB: 1916-04-23 DOA: 08/14/2012 PCP: Kimber Relic, MD  Assessment/Plan  1-Acute on chronic blood loss anemia: -EGD: 1. Stomach with 4 cm area of irregular mucosa. Biopsied.  Duodenal bulb with significantly bleeding AVM. S/P Endoscopic hemostasis. -heme positive, significant drop in Hb from baseline 11-13  -Transfused 3 units PRBC 6/21, 1 more unit 6/23  -Pradaxa on hold  -h/o unremarkable colonoscopy 9 years ago   -Continue with protonix.  2. Demand Ischemia  -cardiac enzymes, troponin starting to trend down, never had any chest pain  -continue metoprolol, unable to add ASA or Heparin in setting of 1  -Not a candidate for cath or invasive intervention due to age, dementia and GI bleed, this was explained to son in detail, [place] cards on call Dr.Ross who agrees with this as well  -2D ECHO with Ef of 45-50% and no regional wall motion abnormalities.   3. Klebsiella UTI:  - Completed 5 days of ceftriaxone.   4. Atrial fibrillation:  -Patient has known atrial fibrillation, and was on Pradaxa pre-admission. Currently rate-controlled. As discussed above, Pradaxa has been discontinued for now.  -Continue with metoprolol.  5. HTN (hypertension): Controlled.  6. Diabetes mellitus:  -metformin on hold, SSI  7. CKD (chronic kidney disease), stage III: Creatinine improved with hydration  8. Encephalopathy: in setting of UTI and GI bleed. Improved.  TSH and B12 normal   Code Status: DNR Family Communication: Care discussed with son who was at bedside.  Disposition Plan: remain in[patient.    Consultants:  Dr Marina Goodell.   Procedures: Endoscopy: 1. Stomach with 4 cm area of irregular mucosa. Biopsied.  2. Duodenal bulb with significantly bleeding AVM. S/P Endoscopic  hemostasis.    Antibiotics: Ceftraixone 6/21   HPI/Subjective: Feeling well, no BM reported by nurse.   Objective: Filed Vitals:   08/17/12  1100 08/17/12 1514 08/17/12 2136 08/18/12 0624  BP: 106/43 112/48 125/56 117/46  Pulse:  65 70 66  Temp:  98 F (36.7 C) 99 F (37.2 C) 98.4 F (36.9 C)  TempSrc:  Oral Oral Oral  Resp: 20 20 18 16   Height:      Weight:      SpO2: 93% 97% 98% 96%    Intake/Output Summary (Last 24 hours) at 08/18/12 0947 Last data filed at 08/18/12 0900  Gross per 24 hour  Intake   1100 ml  Output    350 ml  Net    750 ml   Filed Weights   08/14/12 1530  Weight: 75.751 kg (167 lb)    Exam:   General:  No distress.   Cardiovascular: S 1, S 2 RRR  Respiratory: CTA  Abdomen: BS present, soft, NT  Musculoskeletal: no edema.   Data Reviewed: Basic Metabolic Panel:  Recent Labs Lab 08/14/12 1215 08/15/12 0922 08/16/12 0444 08/17/12 0510 08/18/12 0436  NA 137 136 141 139 139  K 5.8* 4.3 3.8 4.1 4.1  CL 103 104 108 105 107  CO2 20 22 27 29 28   GLUCOSE 180* 217* 166* 173* 213*  BUN 53* 57* 49* 42* 29*  CREATININE 1.41* 1.15* 1.11* 1.13* 0.92  CALCIUM 11.3* 9.8 9.5 9.8 10.0   Liver Function Tests:  Recent Labs Lab 08/14/12 1215 08/15/12 0922 08/18/12 0436  AST 20 154* 49*  ALT 12 175* 128*  ALKPHOS 60 46 52  BILITOT 0.2* 0.5 0.2*  PROT 6.5 5.5* 5.7*  ALBUMIN 3.4* 2.8* 2.8*   No results  found for this basename: LIPASE, AMYLASE,  in the last 168 hours No results found for this basename: AMMONIA,  in the last 168 hours CBC:  Recent Labs Lab 08/14/12 1215 08/15/12 0100 08/15/12 0922 08/16/12 0444 08/17/12 0510 08/18/12 0436  WBC 12.0* 10.3 11.0* 10.9* 9.1 9.0  NEUTROABS 9.1*  --   --   --   --   --   HGB 4.0* 6.1* 7.6* 6.7* 7.4* 7.9*  HCT 13.2* 18.1* 22.2* 20.0* 22.3* 24.3*  MCV 91.7 85.4 86.7 88.1 88.8 90.7  PLT 354 225 218 195 170 180   Cardiac Enzymes:  Recent Labs Lab 08/14/12 1548 08/15/12 0100 08/15/12 0922 08/16/12 0444  TROPONINI 0.43* 0.95* 1.08* 0.86*   BNP (last 3 results) No results found for this basename: PROBNP,  in the last 8760  hours CBG:  Recent Labs Lab 08/17/12 0743 08/17/12 1140 08/17/12 1648 08/17/12 2219 08/18/12 0747  GLUCAP 169* 206* 312* 185* 148*    Recent Results (from the past 240 hour(s))  URINE CULTURE     Status: None   Collection Time    08/14/12 10:30 AM      Result Value Range Status   Specimen Description URINE, CATHETERIZED   Final   Special Requests NONE   Final   Culture  Setup Time 08/14/2012 21:14   Final   Colony Count >=100,000 COLONIES/ML   Final   Culture KLEBSIELLA PNEUMONIAE   Final   Report Status 08/16/2012 FINAL   Final   Organism ID, Bacteria KLEBSIELLA PNEUMONIAE   Final     Studies: Ct Abdomen Pelvis Wo Contrast  08/16/2012   *RADIOLOGY REPORT*  Clinical Data: Large drop in hemoglobin.  Evaluate for retroperitoneal bleed.  Urinary tract infection with confusion.  CT ABDOMEN AND PELVIS WITHOUT CONTRAST  Technique:  Multidetector CT imaging of the abdomen and pelvis was performed following the standard protocol without intravenous contrast.  Comparison: Lumbar spine MRI 08/17/2009.  Findings: There is atelectasis or scarring in both lung bases. There is some pleural thickening on the left.  No significant pleural or pericardial effusion is present.  There is diffuse atherosclerosis status post median sternotomy.  There is no evidence of retroperitoneal hematoma or hemoperitoneum. Both inguinal regions appear normal.  As evaluated in the noncontrast state, the liver, spleen and adrenal glands appear normal.  The gallbladder is contracted without demonstrated abnormality.  There is diffuse pancreatic atrophy and fatty replacement.  Both kidneys demonstrate mild cortical thinning.  There may be a tiny calculus in the lower pole of the left kidney.  A small low-density lesion in the upper pole of the right kidney is likely a cyst.  There is no hydronephrosis.  The stomach, small bowel and colon demonstrate no focal abnormalities.  Stool is present throughout the colon.  There is  aorto iliac atherosclerosis.  The uterus is surgically absent.  The bladder appears normal.  Multiple compression fractures are demonstrated throughout the spine status post spinal augmentation from T12-L3. Since the previous MRI, there is a progressive compression deformity at L4 involving both endplates.  This demonstrates no definite acute features.  L5 appears intact.  Old pelvic fractures are present bilaterally.  IMPRESSION:  1.  No evidence of retroperitoneal hematoma or hemoperitoneum. 2.  No definite acute abdominal pelvic findings. 3.  Multiple thoracolumbar compression deformities with progressive loss of height at L4 compared with lumbar MRI 08/17/2009.  No specific features of an acute fracture are identified.   Original Report Authenticated By: Chrissie Noa  Purcell Mouton, M.D.    Scheduled Meds: . bisacodyl  10 mg Rectal Once  . calcitonin (salmon)  1 spray Alternating Nares Daily  . cefTRIAXone (ROCEPHIN)  IV  1 g Intravenous Q24H  . darifenacin  7.5 mg Oral Daily  . feeding supplement  237 mL Oral TID WC  . gabapentin  600 mg Oral QHS  . insulin aspart  0-5 Units Subcutaneous QHS  . insulin aspart  0-9 Units Subcutaneous TID WC  . levothyroxine  75 mcg Oral QAC breakfast  . metoprolol tartrate  25 mg Oral BID  . multivitamin with minerals  1 tablet Oral Daily  . pantoprazole (PROTONIX) IV  40 mg Intravenous Q12H  . sodium chloride  3 mL Intravenous Q12H  . Travoprost (BAK Free)  1 drop Left Eye QHS  . vitamin C  250 mg Oral Daily   Continuous Infusions:   Active Problems:   GI bleed   HTN (hypertension)   Diabetes mellitus   CKD (chronic kidney disease), stage III   Dementia   Altered mental status   UTI (lower urinary tract infection)   Demand ischemia   GI AVM (gastrointestinal arteriovenous vascular malformation)   Unspecified gastritis and gastroduodenitis without mention of hemorrhage    Time spent: 35 minutes.     REGALADO,BELKYS  [facility] Pager [phone].  If 7PM-7AM, please contact night-coverage at [email or web address], password [facility] 08/18/2012, 9:47 AM  LOS: 4 days

## 2012-08-18 NOTE — Progress Notes (Signed)
[facility] Progress Note   Subjective  Feels fine today.   Objective   Vital signs in last 24 hours: Temp:  [98 F (36.7 C)-99 F (37.2 C)] 98.4 F (36.9 C) (06/25 0624) Pulse Rate:  [64-78] 66 (06/25 0624) Resp:  [11-25] 16 (06/25 0624) BP: (101-130)/(40-56) 117/46 mmHg (06/25 0624) SpO2:  [93 %-99 %] 96 % (06/25 0624) Last BM Date: 08/16/12 General:    white female in NAD Heart:  Regular rate and rhythm Abdomen:  Soft, nontender and nondistended. Normal bowel sounds. Extremities:  Without edema. Neurologic:  Alert and oriented,  grossly normal neurologically. Psych:  Cooperative. Normal mood and affect.  ILab Results:  Recent Labs  08/16/12 0444 08/17/12 0510 08/18/12 0436  WBC 10.9* 9.1 9.0  HGB 6.7* 7.4* 7.9*  HCT 20.0* 22.3* 24.3*  PLT 195 170 180   BMET  Recent Labs  08/16/12 0444 08/17/12 0510 08/18/12 0436  NA 141 139 139  K 3.8 4.1 4.1  CL 108 105 107  CO2 27 29 28   GLUCOSE 166* 173* 213*  BUN 49* 42* 29*  CREATININE 1.11* 1.13* 0.92  CALCIUM 9.5 9.8 10.0   LFT  Recent Labs  08/18/12 0436  PROT 5.7*  ALBUMIN 2.8*  AST 49*  ALT 128*  ALKPHOS 52  BILITOT 0.2*  BILIDIR <0.1  IBILI NOT CALCULATED   PT/INR  Recent Labs  08/15/12 0922  LABPROT 31.4*  INR 3.25*    Studies/Results: Ct Abdomen Pelvis Wo Contrast  08/16/2012   *RADIOLOGY REPORT*  Clinical Data: Large drop in hemoglobin.  Evaluate for retroperitoneal bleed.  Urinary tract infection with confusion.  CT ABDOMEN AND PELVIS WITHOUT CONTRAST  Technique:  Multidetector CT imaging of the abdomen and pelvis was performed following the standard protocol without intravenous contrast.  Comparison: Lumbar spine MRI 08/17/2009.  Findings: There is atelectasis or scarring in both lung bases. There is some pleural thickening on the left.  No significant pleural or pericardial effusion is present.  There is diffuse atherosclerosis status post median sternotomy.  There is no  evidence of retroperitoneal hematoma or hemoperitoneum. Both inguinal regions appear normal.  As evaluated in the noncontrast state, the liver, spleen and adrenal glands appear normal.  The gallbladder is contracted without demonstrated abnormality.  There is diffuse pancreatic atrophy and fatty replacement.  Both kidneys demonstrate mild cortical thinning.  There may be a tiny calculus in the lower pole of the left kidney.  A small low-density lesion in the upper pole of the right kidney is likely a cyst.  There is no hydronephrosis.  The stomach, small bowel and colon demonstrate no focal abnormalities.  Stool is present throughout the colon.  There is aorto iliac atherosclerosis.  The uterus is surgically absent.  The bladder appears normal.  Multiple compression fractures are demonstrated throughout the spine status post spinal augmentation from T12-L3. Since the previous MRI, there is a progressive compression deformity at L4 involving both endplates.  This demonstrates no definite acute features.  L5 appears intact.  Old pelvic fractures are present bilaterally.  IMPRESSION:  1.  No evidence of retroperitoneal hematoma or hemoperitoneum. 2.  No definite acute abdominal pelvic findings. 3.  Multiple thoracolumbar compression deformities with progressive loss of height at L4 compared with lumbar MRI 08/17/2009.  No specific features of an acute fracture are identified.   Original Report Authenticated By: Carey Bullocks, M.D.   EGD yesterday ENDOSCOPIC IMPRESSION:  1. Stomach with 4 cm area of irregular mucosa. Biopsied.  2. Duodenal bulb with significantly bleeding AVM. S/P Endoscopic  hemostasis.   Assessment / Plan:   1. Upper GI bleed. EGD yesterday revealed bleeding AVM in duodenal bulb, s/p  APC for hemostasis.   2. Normocytic anemia with drop in Hgb (on Pradaxa) from baseline of 11-12gms to 4 secondary to #1.  Hgb stable at 7.9 post transfusion. Recommend BID iron for a couple of months upon  discharge.   3. Gastric mucosa irregularity, biopsies pending.    Follow up appointment made with [place] on 09/13/12 @ 2:30pm.   LOS: 4 days   [name]  08/18/2012, 9:13 AM   GI ATTENDING  Patient was personally seen and examined. Interval history and laboratories reviewed. Agree with H&P as above. Patient's son in room. She looks and feels better. No GI bleeding. Hemoglobin stable. She is status post endoscopic hemostatic therapy for bleeding AVM. Biopsies pending an irregular gastric mucosa. We will followup on biopsies. From a GI standpoint, advance her diet as tolerated. Make sure she is well enough to go home with proper supportive care. Long-term, I would recommend refraining from Pradaxa given the significance of her GI bleeding. She may have other AVMs. Aspirin therapy would be acceptable and provide her some protection. We will sign off. We're available as needed. Thank you  Wilhemina Bonito. Eda Keys., M.D. [facility] Division of Gastroenterology

## 2012-08-18 NOTE — Care Management Note (Addendum)
    Page 1 of 1   08/19/2012     10:58:22 AM   CARE MANAGEMENT NOTE 08/19/2012  Patient:  [name], [name]   Account Number:  [id number]  Date Initiated:  08/15/2012  Documentation initiated by:  [facility]  Subjective/Objective Assessment:   79 year old female admitted with anemia and UTI.     Action/Plan:   Lived at home with family PTA, also had HH services.   Anticipated DC Date:  08/19/2012   Anticipated DC Plan:  SKILLED NURSING FACILITY      DC Planning Services  CM consult      Choice offered to / List presented to:             Status of service:  Completed, signed off Medicare Important Message given?  NA - LOS <3 / Initial given by admissions (If response is "NO", the following Medicare IM given date fields will be blank) Date Medicare IM given:   Date Additional Medicare IM given:    Discharge Disposition:  SKILLED NURSING FACILITY  Per UR Regulation:  Reviewed for med. necessity/level of care/duration of stay  If discussed at Long Length of Stay Meetings, dates discussed:    Comments:  08/19/12 Belton Peplinski RN,BSN NCM [phone] MED STABLE FOR D/C SNF.  08/18/12 Joahan Swatzell RN,BSN NCM [phone] OT-SNF.AWAIT PT RECOMMENDATIONS.

## 2012-08-19 LAB — BASIC METABOLIC PANEL
CO2: 29 mEq/L (ref 19–32)
Calcium: 10.5 mg/dL (ref 8.4–10.5)
Creatinine, Ser: 1.01 mg/dL (ref 0.50–1.10)
GFR calc Af Amer: 54 mL/min — ABNORMAL LOW (ref >90)
GFR calc non Af Amer: 46 mL/min — ABNORMAL LOW (ref >90)
Sodium: 143 mEq/L (ref 135–145)

## 2012-08-19 LAB — CBC
MCH: 30 pg (ref 26.0–34.0)
MCV: 92.3 fL (ref 78.0–100.0)
Platelets: 172 10*3/uL (ref 150–400)
RBC: 2.6 MIL/uL — ABNORMAL LOW (ref 3.87–5.11)
RDW: 16.9 % — ABNORMAL HIGH (ref 11.5–15.5)

## 2012-08-19 LAB — GLUCOSE, CAPILLARY
Glucose-Capillary: 191 mg/dL — ABNORMAL HIGH (ref 70–99)
Glucose-Capillary: 228 mg/dL — ABNORMAL HIGH (ref 70–99)

## 2012-08-19 MED ORDER — FERROUS SULFATE 325 (65 FE) MG PO TABS: 325.0000 mg | ORAL_TABLET | Freq: Two times a day (BID) | ORAL | Status: DC

## 2012-08-19 MED ORDER — METOPROLOL TARTRATE 25 MG PO TABS: 25.0000 mg | ORAL_TABLET | Freq: Two times a day (BID) | ORAL | Status: DC

## 2012-08-19 MED ORDER — PANTOPRAZOLE SODIUM 40 MG PO TBEC: 40.0000 mg | DELAYED_RELEASE_TABLET | Freq: Every day | ORAL | Status: DC

## 2012-08-19 NOTE — Clinical Social Work Placement (Signed)
      Clinical Social Work Department CLINICAL SOCIAL WORK PLACEMENT NOTE 08/19/2012  Patient:  NAYLEE, FRANKOWSKI  Account Number:  [id number] Admit date:  08/14/2012  Clinical Social Worker:  Becky Sax, LCSW  Date/time:  08/19/2012 12:00 M  Clinical Social Work is seeking post-discharge placement for this patient at the following level of care:   SKILLED NURSING   (*CSW will update this form in Epic as items are completed)   08/19/2012  Patient/family provided with [facility] Department of Clinical Social Works list of facilities offering this level of care within the geographic area requested by the patient (or if unable, by the patients family).  08/19/2012  Patient/family informed of their freedom to choose among providers that offer the needed level of care, that participate in Medicare, Medicaid or managed care program needed by the patient, have an available bed and are willing to accept the patient.  08/19/2012  Patient/family informed of [facility] ownership interest in [facility], as well as of the fact that they are under no obligation to receive care at this facility.  PASARR submitted to EDS on 08/19/2012 PASARR number received from EDS on 08/19/2012  FL2 transmitted to all facilities in geographic area requested by pt/family on  08/19/2012 FL2 transmitted to all facilities within larger geographic area on 08/19/2012  Patient informed that his/her managed care company has contracts with or will negotiate with  certain facilities, including the following:     Patient/family informed of bed offers received:  08/19/2012 Patient chooses bed at [facility] Physician recommends and patient chooses bed at    Patient to be transferred to [facility] on  08/19/2012 Patient to be transferred to facility by ptar  The following physician request were entered in Epic:   Additional Comments:

## 2012-08-19 NOTE — Clinical Social Work Psychosocial (Signed)
     Clinical Social Work Department BRIEF PSYCHOSOCIAL ASSESSMENT 08/19/2012  Patient:  [name], [name]     Account Number:  [id number]     Admit date:  08/14/2012  Clinical Social Worker:  Hattie Perch  Date/Time:  08/19/2012 12:00 M  Referred by:  Physician  Date Referred:  08/19/2012 Referred for  SNF Placement   Other Referral:   Interview type:  Family Other interview type:    PSYCHOSOCIAL DATA Living Status:  FAMILY Admitted from facility:   Level of care:   Primary support name:  Dalisha Shively Primary support relationship to patient:  CHILD, ADULT Degree of support available:   good    CURRENT CONCERNS Current Concerns  Post-Acute Placement   Other Concerns:    SOCIAL WORK ASSESSMENT / PLAN CSW met with patient and patient's son at bedside. patient was sleeping soundly. patients son would like patient to go to snf temporarily. he states that patient has been to [facility] in the past and he would like for her to go there.   Assessment/plan status:   Other assessment/ plan:   Information/referral to community resources:    PATIENTS/FAMILYS RESPONSE TO PLAN OF CARE: patient reluctantly agreeable for patient to go to snf and would like [facility].

## 2012-08-19 NOTE — Progress Notes (Signed)
Physical Therapy Treatment Patient Details Name: [name] MRN: [id number] DOB: 1916-04-10 Today's Date: 08/19/2012 Time:  -     PT Assessment / Plan / Recommendation  PT Comments   Son reports pt was up most of the night and really sleepy this am.  Assisted pt OOB to [facility] needing + 2 assist and with great difficulty.  NT stated pt did better yesterday.  Pt had a small BM.  Assisted pt off BSC to recliner.  Pt unable to fully stand and support self and demonstrated great difficulty taking any steps to turn and pivot. Pt plans to D/C to [facility].   Follow Up Recommendations  Home health PT;Supervision/Assistance - 24 hour     Does the patient have the potential to tolerate intense rehabilitation     Barriers to Discharge        Equipment Recommendations  None recommended by PT    Recommendations for Other Services    Frequency Min 3X/week   Progress towards PT Goals    Plan      Precautions / Restrictions Precautions Precautions: Fall Restrictions Weight Bearing Restrictions: No   Pertinent Vitals/Pain No c/o pain    Mobility  Bed Mobility Bed Mobility: Supine to Sit Rolling Right: 1: +1 Total assist Details for Bed Mobility Assistance: HOB increased and use of pad to pivot hip around.  Severe posterior LOB and increased groggyness. Transfers Transfers: Sit to Stand;Stand to Sit Sit to Stand: 1: +2 Total assist;From elevated surface;With upper extremity assist;From bed Sit to Stand: Patient Percentage: 30% Stand to Sit: 1: +2 Total assist;With upper extremity assist;With armrests;To chair/3-in-1 Stand to Sit: Patient Percentage: 30% Details for Transfer Assistance: 75% VC's on proper tech/hand placement.  Pt required "Bear Hug" SPS with great difficulty pivoting.  Pt with great difficulty supporting her own body weight with B knees buckling.  Unable to take functional steps.  Ambulation/Gait Ambulation/Gait Assistance Details: Pt unable to functionally take steps      PT Goals                                                         progressing    Visit Information  Last PT Received On: 08/19/12 Assistance Needed: +2 History of Present Illness: Pt with present illness of GIB.  Hx of dementia, lives with son and is mostly w/c or chair bound.  Son assists pt to chair via stand step transfer.     Subjective Data      Cognition       Balance   poor  End of Session PT - End of Session Equipment Utilized During Treatment: Gait belt Activity Tolerance: Patient limited by fatigue Patient left: in chair;with call bell/phone within reach;with family/visitor present Nurse Communication: Mobility status   Felecia Shelling  PTA [facility]  Acute  Rehab Pager      [phone]

## 2012-08-19 NOTE — Progress Notes (Signed)
Patient cleared for discharge. Packet copied and placed in [place]. ptar called for transport. Son aware and agreeable.  Django Nguyen C. Abu Heavin MSW, LCSW [phone]

## 2012-08-19 NOTE — Discharge Summary (Addendum)
Physician Discharge Summary  [name] JXB:[id number] DOB: Oct 11, 1916 DOA: 08/14/2012  PCP: Tammy Relic, MD  Admit date: 08/14/2012 Discharge date: 08/19/2012  Time spent: 35 minutes  Recommendations for Outpatient Follow-up:  1. Need CBC to follow Hb level.  2. Needs to follow up with [name] and her primary cardiologist to determine when to resume anticoagulation.   Discharge Diagnoses:    GI bleed, Duodenal bulb with significantly bleeding AVM.   HTN (hypertension)   Diabetes mellitus   CKD (chronic kidney disease), stage III   Dementia   Altered mental status   UTI (lower urinary tract infection),Klebsiella   Demand ischemia   GI AVM (gastrointestinal arteriovenous vascular malformation)   Unspecified gastritis and gastroduodenitis without mention of hemorrhage   Discharge Condition: stable.   Diet recommendation: Carb Modified.  Filed Weights   08/14/12 1530  Weight: 75.751 kg (167 lb)    History of present illness:  This is a 79 year old female, with known history of DM-2, dyslipidemia, hypothyroidism, CKD-3 (baseline creatinine is 1.05 on 06/09/12), chronic anemia, vitamin D deficiency, peripheral neuropathy, gait disorder, history of falls (none for the past 2 years), HTN, atrial fibrillation on Pradaxa, CAD, s/p CABG 1998, dementia, OA, s/p left knee arthroscopy, s/p lumbar laminectomy, osteoporosis, lumber and thoracic compression fractures, s/p vertebroplasty, previous pelvic fracture, scoliosis, acquired trigger finger, carpal tunnel syndrome, atopic dermatitis, myoclonus, macular degeneration, glaucoma. Patient is unable to contribute to history, but according to her son/HPOA, [name] (Tel: [phone]), who was present in the ED, she has become progressively weak in the past 3 weeks or so. She has also become increasingly confused in the last 3 weeks, particularly in the mornings, and has had episodes of visual hallucinosis. Patient is bed-bound and  wheelchair bound, and has good appetite,. She has had no hematemesis or melena, and no hematochezia. In addition, she has had no abdominal pain, vomiting or diarrhea, fever, SOB or chills. She does have a tendency to constipation. In the ED, BP was 105/40-93/69, HB 4.0, MCV 91.7 (HB 13.5 on 06/09/12), FOBT positive, creatinine 1.41, BUN 53. U/A revealed pyuria and bacteriuria.    Hospital Course:  1-Acute on chronic blood loss anemia:  -EGD: 1. Stomach with 4 cm area of irregular mucosa. Biopsied. Duodenal bulb with significantly bleeding AVM. S/P Endoscopic hemostasis.  -heme positive, significant drop in Hb from baseline 11-13  -Transfused 3 units PRBC 6/21, 1 more unit 6/23  -Pradaxa on hold. Needs to follow up with [name] and her cardiologist to determine when to resume anticoagulation.  -h/o unremarkable colonoscopy 9 years ago  -Continue with protonix.  2. Demand Ischemia  -cardiac enzymes, troponin starting to trend down, never had any chest pain  -continue metoprolol, unable to add ASA or Heparin in setting of 1  -Not a candidate for cath or invasive intervention due to age, dementia and GI bleed, this was explained to son in detail, [place] cards on call Tammy.Ross who agrees with this as well  -2D ECHO with Ef of 45-50% and no regional wall motion abnormalities.  3. Klebsiella UTI:  - Completed 5 days of ceftriaxone.  4. Atrial fibrillation:  -Patient has known atrial fibrillation, and was on Pradaxa pre-admission. Currently rate-controlled. As discussed above, Pradaxa has been discontinued for now.  -Continue with metoprolol.  5. HTN (hypertension): Controlled.  6. Diabetes mellitus:  -metformin on hold, SSI  7. CKD (chronic kidney disease), stage III: Creatinine improved with hydration  8. Encephalopathy: in setting  of UTI and GI bleed. Improved. TSH and B12 normal History of Multiple thoracolumbar compression deformities with progressive  loss of height at L4 compared with  lumbar MRI 08/17/2009. No specific features of an acute fracture are identified. Follow up with PCP.    Procedures: Endoscopy: 1. Stomach with 4 cm area of irregular mucosa. Biopsied.  2. Duodenal bulb with significantly bleeding AVM. S/P Endoscopic  hemostasis.   Consultations: [name].   Discharge Exam: Filed Vitals:   08/18/12 0624 08/18/12 1300 08/18/12 2100 08/19/12 0537  BP: 117/46 107/45 128/44 128/50  Pulse: 66 67 72 67  Temp: 98.4 F (36.9 C) 98.5 F (36.9 C) 98.2 F (36.8 C) 99.4 F (37.4 C)  TempSrc: Oral Oral Oral Oral  Resp: 16 18 18 16   Height:      Weight:      SpO2: 96% 98% 98% 94%    General: No distress.  Cardiovascular: S 1, S 2 RRR Respiratory: CTA  Discharge Instructions  Discharge Orders   Future Appointments Provider Department Dept Phone   09/07/2012 1:45 PM Tammy Relic, MD [facility] [phone]   09/13/2012 2:30 PM Tammy Fredrickson, MD [facility] [phone]   Future Orders Complete By Expires     Diet - low sodium heart healthy  As directed     Increase activity slowly  As directed         Medication List    STOP taking these medications       dabigatran 150 MG Caps  Commonly known as:  PRADAXA     FLECTOR 1.3 % Ptch  Generic drug:  diclofenac      TAKE these medications       atorvastatin 20 MG tablet  Commonly known as:  LIPITOR  take 1 tablet by mouth once daily to LOWER cholesterol     calcitonin (salmon) 200 UNIT/ACT nasal spray  Commonly known as:  MIACALCIN/FORTICAL  INSTILL 1 SPRAY INTO ALTERNATING NOSTRILS ONCE DAILY FOR OSTEOPROSIS     CENTRUM SILVER PO  Take 1 tablet by mouth daily.     ferrous sulfate 325 (65 FE) MG tablet  Take 1 tablet (325 mg total) by mouth 2 (two) times daily with a meal.     gabapentin 300 MG capsule  Commonly known as:  NEURONTIN  Take 600 mg by mouth at bedtime.     levothyroxine 75 MCG tablet  Commonly known as:  SYNTHROID, LEVOTHROID  TAKE  1 TABLET BY MOUTH ONCE DAILY FOR THYROID SUPPLEMENT     metFORMIN 1000 MG tablet  Commonly known as:  GLUCOPHAGE  take 1 tablet by mouth twice a day TO CONTROL DIABETES     metoprolol tartrate 25 MG tablet  Commonly known as:  LOPRESSOR  Take 1 tablet (25 mg total) by mouth 2 (two) times daily.     olmesartan 20 MG tablet  Commonly known as:  BENICAR  Take 10 mg by mouth daily. Takes 1/2 tablet     pantoprazole 40 MG tablet  Commonly known as:  PROTONIX  Take 1 tablet (40 mg total) by mouth daily.     pregabalin 75 MG capsule  Commonly known as:  LYRICA  Take 75 mg by mouth 2 (two) times daily as needed (pain).     traMADol 50 MG tablet  Commonly known as:  ULTRAM  Take 50 mg by mouth every 12 (twelve) hours as needed for pain.     TRAVATAN 0.004 % ophthalmic solution  Generic drug:  travoprost (benzalkonium)  Place 1 drop into the left eye at bedtime.     Trospium Chloride 60 MG Cp24  Take 1 capsule by mouth daily.     vitamin C 250 MG tablet  Commonly known as:  ASCORBIC ACID  Take 250 mg by mouth daily.     VOLTAREN 1 % Gel  Generic drug:  diclofenac sodium  Apply topically. Apply twice a day to legs     zolpidem 10 MG tablet  Commonly known as:  AMBIEN  Take 5 mg by mouth at bedtime as needed for sleep.       Allergies  Allergen Reactions  . Noroxin (Norfloxacin) Swelling  . Sulfa Antibiotics Nausea And Vomiting  . Prozac (Fluoxetine Hcl) Rash      The results of significant diagnostics from this hospitalization (including imaging, microbiology, ancillary and laboratory) are listed below for reference.    Significant Diagnostic Studies: Ct Abdomen Pelvis Wo Contrast  08/16/2012   *RADIOLOGY REPORT*  Clinical Data: Large drop in hemoglobin.  Evaluate for retroperitoneal bleed.  Urinary tract infection with confusion.  CT ABDOMEN AND PELVIS WITHOUT CONTRAST  Technique:  Multidetector CT imaging of the abdomen and pelvis was performed following the  standard protocol without intravenous contrast.  Comparison: Lumbar spine MRI 08/17/2009.  Findings: There is atelectasis or scarring in both lung bases. There is some pleural thickening on the left.  No significant pleural or pericardial effusion is present.  There is diffuse atherosclerosis status post median sternotomy.  There is no evidence of retroperitoneal hematoma or hemoperitoneum. Both inguinal regions appear normal.  As evaluated in the noncontrast state, the liver, spleen and adrenal glands appear normal.  The gallbladder is contracted without demonstrated abnormality.  There is diffuse pancreatic atrophy and fatty replacement.  Both kidneys demonstrate mild cortical thinning.  There may be a tiny calculus in the lower pole of the left kidney.  A small low-density lesion in the upper pole of the right kidney is likely a cyst.  There is no hydronephrosis.  The stomach, small bowel and colon demonstrate no focal abnormalities.  Stool is present throughout the colon.  There is aorto iliac atherosclerosis.  The uterus is surgically absent.  The bladder appears normal.  Multiple compression fractures are demonstrated throughout the spine status post spinal augmentation from T12-L3. Since the previous MRI, there is a progressive compression deformity at L4 involving both endplates.  This demonstrates no definite acute features.  L5 appears intact.  Old pelvic fractures are present bilaterally.  IMPRESSION:  1.  No evidence of retroperitoneal hematoma or hemoperitoneum. 2.  No definite acute abdominal pelvic findings. 3.  Multiple thoracolumbar compression deformities with progressive loss of height at L4 compared with lumbar MRI 08/17/2009.  No specific features of an acute fracture are identified.   Original Report Authenticated By: Carey Bullocks, M.D.   Ct Head Wo Contrast  08/14/2012   *RADIOLOGY REPORT*  Clinical Data: Altered mental status and fatigue.  CT HEAD WITHOUT CONTRAST  Technique:  Contiguous  axial images were obtained from the base of the skull through the vertex without contrast.  Comparison: 12/07/2010  Findings: Stable advanced small vessel ischemic changes in the periventricular white matter and diffuse cortical atrophy.  The brain demonstrates no evidence of hemorrhage, infarction, edema, mass effect, extra-axial fluid collection, hydrocephalus or mass lesion.  The skull is unremarkable.  IMPRESSION: Stable atrophy and small vessel disease.  No acute findings.   Original Report Authenticated By: Sherrine Maples  Fredia Sorrow, M.D.   Dg Chest Port 1 View  08/14/2012   *RADIOLOGY REPORT*  Clinical Data: Acute mental status changes and shortness of breath.  PORTABLE CHEST - 1 VIEW  Comparison: 05/06/2011  Findings: Lung volumes are low bilaterally with bibasilar atelectasis present.  No overt edema or pulmonary consolidation is identified.  Heart size is within normal limits following prior CABG.  No significant pleural fluid is identified.  IMPRESSION: Low lung volumes with bibasilar atelectasis.   Original Report Authenticated By: Irish Lack, M.D.    Microbiology: Recent Results (from the past 240 hour(s))  URINE CULTURE     Status: None   Collection Time    08/14/12 10:30 AM      Result Value Range Status   Specimen Description URINE, CATHETERIZED   Final   Special Requests NONE   Final   Culture  Setup Time 08/14/2012 21:14   Final   Colony Count >=100,000 COLONIES/ML   Final   Culture KLEBSIELLA PNEUMONIAE   Final   Report Status 08/16/2012 FINAL   Final   Organism ID, Bacteria KLEBSIELLA PNEUMONIAE   Final     Labs: Basic Metabolic Panel:  Recent Labs Lab 08/15/12 0922 08/16/12 0444 08/17/12 0510 08/18/12 0436 08/19/12 0510  NA 136 141 139 139 143  K 4.3 3.8 4.1 4.1 4.3  CL 104 108 105 107 107  CO2 22 27 29 28 29   GLUCOSE 217* 166* 173* 213* 240*  BUN 57* 49* 42* 29* 22  CREATININE 1.15* 1.11* 1.13* 0.92 1.01  CALCIUM 9.8 9.5 9.8 10.0 10.5   Liver Function  Tests:  Recent Labs Lab 08/14/12 1215 08/15/12 0922 08/18/12 0436  AST 20 154* 49*  ALT 12 175* 128*  ALKPHOS 60 46 52  BILITOT 0.2* 0.5 0.2*  PROT 6.5 5.5* 5.7*  ALBUMIN 3.4* 2.8* 2.8*   No results found for this basename: LIPASE, AMYLASE,  in the last 168 hours No results found for this basename: AMMONIA,  in the last 168 hours CBC:  Recent Labs Lab 08/14/12 1215  08/15/12 0922 08/16/12 0444 08/17/12 0510 08/18/12 0436 08/19/12 0510  WBC 12.0*  < > 11.0* 10.9* 9.1 9.0 7.8  NEUTROABS 9.1*  --   --   --   --   --   --   HGB 4.0*  < > 7.6* 6.7* 7.4* 7.9* 7.8*  HCT 13.2*  < > 22.2* 20.0* 22.3* 24.3* 24.0*  MCV 91.7  < > 86.7 88.1 88.8 90.7 92.3  PLT 354  < > 218 195 170 180 172  < > = values in this interval not displayed. Cardiac Enzymes:  Recent Labs Lab 08/14/12 1548 08/15/12 0100 08/15/12 0922 08/16/12 0444  TROPONINI 0.43* 0.95* 1.08* 0.86*   BNP: BNP (last 3 results) No results found for this basename: PROBNP,  in the last 8760 hours CBG:  Recent Labs Lab 08/18/12 0747 08/18/12 1139 08/18/12 1629 08/18/12 2132 08/19/12 0740  GLUCAP 148* 272* 200* 247* 191*       Signed:  Silveria Botz  [facility] 08/19/2012, 9:49 AM

## 2012-08-19 NOTE — Clinical Documentation Improvement (Signed)
THIS DOCUMENT IS NOT A PERMANENT PART OF THE MEDICAL RECORD  Please update your documentation with the medical record to reflect your response to this query. If you need help knowing how to do this please call [phone].  08/19/12   Dear Dr. Sunnie Nielsen, [facility],  In a better effort to capture your patient's severity of illness, reflect appropriate length of stay and utilization of resources, a review of the patient medical record has revealed the following indicators.    Based on your clinical judgment, please clarify and document in a progress note and/or discharge summary the clinical condition associated with the following supporting information:  In responding to this query please exercise your independent judgment.  The fact that a query is asked, does not imply that any particular answer is desired or expected.  Pt with multiple thoracolumbar compression deformities with progressive  loss of height at L4  Clarification Needed   Please clarify if you agree pt with "multiple thoracolumbar compression deformities with progressive loss of height at L4" or other diagnosis and document in pn or d/c summary    Possible Clinical Conditions?  Traumatic thoraclumbar Compression fractures Pathological thoraclumbar Compression fractures  ____________________ ____________________ _______Other Condition__________________ _______Cannot Clinically Determine   Supporting Information:  Risk Factors: GIB  UTI,  ARF  Coagulopathy  CKD stage 3  Poss NSTEMI  ABLA  AFIB  Dementia  Confusion  Altered Mental Status  Unspecified psychosis  Signs & Symptoms:  Diagnostics: CT Abdomen Pelvis Wo Contrast 08/16/12  3. Multiple thoracolumbar compression deformities with progressive  loss of height at L4 compared with lumbar MRI 08/17/2009. No  specific features of an acute fracture    Treatment acetaminophen (TYLENOL) tablet 650 mg     You may use possible, probable, or  suspect with inpatient documentation. possible, probable, suspected diagnoses MUST be documented at the time of discharge  Reviewed: additional documentation in the medical record  Thank You,  Enis Slipper  RN, BSN, MSN/Inf, CCDS Clinical Documentation Specialist [facility] Dept Pager: [phone] / E-mail: [email or web address]   [phone] Health Information Management [place]

## 2012-09-04 ENCOUNTER — Other Ambulatory Visit: Payer: Self-pay | Admitting: Internal Medicine

## 2012-09-06 ENCOUNTER — Encounter: Payer: Self-pay | Admitting: *Deleted

## 2012-09-07 ENCOUNTER — Ambulatory Visit: Payer: Medicare Other | Admitting: Internal Medicine

## 2012-09-13 ENCOUNTER — Other Ambulatory Visit (INDEPENDENT_AMBULATORY_CARE_PROVIDER_SITE_OTHER): Payer: Medicare Other

## 2012-09-13 ENCOUNTER — Encounter: Payer: Self-pay | Admitting: Internal Medicine

## 2012-09-13 ENCOUNTER — Ambulatory Visit (INDEPENDENT_AMBULATORY_CARE_PROVIDER_SITE_OTHER): Payer: Medicare Other | Admitting: Internal Medicine

## 2012-09-13 VITALS — BP 132/60 | HR 88 | Ht 62.0 in | Wt 156.0 lb

## 2012-09-13 DIAGNOSIS — Q2733 Arteriovenous malformation of digestive system vessel: Secondary | ICD-10-CM

## 2012-09-13 DIAGNOSIS — D62 Acute posthemorrhagic anemia: Secondary | ICD-10-CM

## 2012-09-13 DIAGNOSIS — K5520 Angiodysplasia of colon without hemorrhage: Secondary | ICD-10-CM

## 2012-09-13 LAB — CBC WITH DIFFERENTIAL/PLATELET
Basophils Relative: 0.4 % (ref 0.0–3.0)
Eosinophils Absolute: 0.2 10*3/uL (ref 0.0–0.7)
Eosinophils Relative: 2.5 % (ref 0.0–5.0)
HCT: 34.4 % — ABNORMAL LOW (ref 36.0–46.0)
Hemoglobin: 11.2 g/dL — ABNORMAL LOW (ref 12.0–15.0)
Lymphs Abs: 2.3 10*3/uL (ref 0.7–4.0)
MCHC: 32.7 g/dL (ref 30.0–36.0)
MCV: 88.3 fl (ref 78.0–100.0)
Monocytes Absolute: 0.7 10*3/uL (ref 0.1–1.0)
Neutro Abs: 5.7 10*3/uL (ref 1.4–7.7)
Neutrophils Relative %: 63.9 % (ref 43.0–77.0)
RBC: 3.9 Mil/uL (ref 3.87–5.11)
WBC: 8.9 10*3/uL (ref 4.5–10.5)

## 2012-09-13 NOTE — Progress Notes (Signed)
HISTORY OF PRESENT ILLNESS:  [name] is a 79 y.o. female with multiple medical problems as listed below who presents today for post hospitalization followup. She was admitted to the hospital one month ago with change in mental status and profound anemia (hemoglobin 4.0, MCV 91.7). Baseline hemoglobin 2 months earlier was 13.5. She had been on Pradaxa for atrial fibrillation. There was no obvious GI bleeding by history. Thus, a CT scan of the abdomen and pelvis without contrast was ordered. There was no evidence for retroperitoneal hematoma or hemoperitoneum. She was transfused. Upper endoscopy was performed 08/17/2012. Patient was found to have a duodenal bulb AVM with significant active bleeding which was treated with APC to achieve hemostasis. Irregular area of the gastric mucosa was biopsied and returned as benign gastric mucosa. Patient was discharged on 08/19/2012 to [facility] nursing facility. She's anticipating discharged to her home tomorrow. She is accompanied today by her son. She is no longer on anticoagulation therapy. She is taking iron sulfate twice daily without problems. She is also on pantoprazole 40 mg daily and vitamin C. Hemoglobin of hospital discharge was 7.8  REVIEW OF SYSTEMS:  All non-GI ROS negative except for difficulty walking  Past Medical History  Diagnosis Date  . Diabetes type 2, uncontrolled   . Bronchitis, acute   . Anemia   . Gait disorder   . Hypertension   . Other disorder of calcium metabolism   . Myoclonus   . Hypothyroid   . Mild memory disturbance   . Hyperlipidemia   . Scoliosis   . Osteoporosis   . Eczema   . Hemoptysis   . Hyperpotassemia   . Unspecified hypertrophic and atrophic condition of skin   . Seborrheic keratosis   . Urinary tract infection, site not specified   . Hematuria, unspecified   . Open wound of knee, leg (except thigh), and ankle, without mention of complication   . Personal history of fall   . Myoclonus   .  Chronic kidney disease, unspecified   . Varicose veins of lower extremities with inflammation   . Trigger finger (acquired)   . Diastasis of muscle   . Type II or unspecified type diabetes mellitus without mention of complication, not stated as uncontrolled   . Chest pain, unspecified   . Closed fracture of lumbar vertebra without mention of spinal cord injury   . Personality change due to conditions classified elsewhere   . Closed dislocation, thoracic vertebra   . Carpal tunnel syndrome   . Other specified idiopathic peripheral neuropathy   . Unspecified constipation   . Unspecified glaucoma(365.9)   . Macular degeneration (senile) of retina, unspecified   . Unspecified urinary incontinence   . Unspecified pruritic disorder   . Unspecified closed fracture of pelvis   . Unspecified vitamin D deficiency   . Other atopic dermatitis and related conditions   . Other disorder of calcium metabolism   . Diabetes with renal manifestations(250.4)   . Arthralgia of temporomandibular joint   . Insomnia, unspecified   . Obesity, unspecified   . Allergic rhinitis, cause unspecified   . Atrial fibrillation   . Unspecified hereditary and idiopathic peripheral neuropathy   . Arthropathy, unspecified, site unspecified     Past Surgical History  Procedure Laterality Date  . Abdominal hysterectomy      1973  . Coronary artery bypass graft      Dr Tyrone Sage 04/1996  . Biopsy breast      2000  . Knee  arthroscopy      left Dr Thurston Hole   . Lumbar spine surgery      Dr Newell Coral   . Laser laparoscopy      right eye 04/2005  . Right oophorectomy      1950  . Esophagogastroduodenoscopy N/A 08/17/2012    Procedure: ESOPHAGOGASTRODUODENOSCOPY (EGD);  Surgeon: Hilarie Fredrickson, MD;  Location: [facility];  Service: Endoscopy;  Laterality: N/A;  . Hot hemostasis N/A 08/17/2012    Procedure: HOT HEMOSTASIS (ARGON PLASMA COAGULATION/BICAP);  Surgeon: Hilarie Fredrickson, MD;  Location: [facility];  Service:  Endoscopy;  Laterality: N/A;    Social History Luzelena D Shannahan  reports that she has never smoked. She has never used smokeless tobacco. She reports that she does not drink alcohol or use illicit drugs.  family history includes Cancer in her sister and Heart disease in her mother.  Allergies  Allergen Reactions  . Noroxin (Norfloxacin) Swelling  . Sulfa Antibiotics Nausea And Vomiting  . Prozac (Fluoxetine Hcl) Rash       PHYSICAL EXAMINATION:  Vital signs: BP 132/60  Pulse 88  Ht 5\' 2"  (1.575 m)  Wt 156 lb (70.761 kg)  BMI 28.53 kg/m2 General: Well-developed, well-nourished, no acute distress HEENT: Sclerae are anicteric, conjunctiva pink. Oral mucosa intact Lungs: Clear Heart: Irregular Abdomen: soft, nontender, nondistended, no obvious ascites, no peritoneal signs, normal bowel sounds. No organomegaly. Extremities: No edema Psychiatric: alert and oriented x3. Cooperative    ASSESSMENT:  #1. Gastrointestinal vascular malformation of the duodenum with acute bleeding status post endoscopic hemostatic therapy 08/17/2012. No obvious evidence for recurrent bleeding #2. Anemia secondary to blood loss #3. Multiple medical problems  PLAN:  #1. Recheck CBC today #2. Continue iron twice daily indefinitely as well as vitamin C #3. Continue PPI #4. GI followup when necessary

## 2012-09-13 NOTE — Patient Instructions (Addendum)
Please follow up with Dr. Perry as needed 

## 2012-09-20 ENCOUNTER — Telehealth: Payer: Self-pay | Admitting: Geriatric Medicine

## 2012-09-20 NOTE — Telephone Encounter (Signed)
Hold all blood pressure medication- cont to take blood pressures daily- and make follow up appt

## 2012-09-20 NOTE — Telephone Encounter (Signed)
Home health called and said that the patient's blood pressure was 86/40. She takes Lopressor 25 bid and benicar 10 mg of benicar once daily. Do we need to make any changes?

## 2012-09-21 NOTE — Telephone Encounter (Signed)
Spoke with Laureen Abrahams. Informed him to stop all blood pressure medications and patient will be in tomorrow to see you.

## 2012-09-22 ENCOUNTER — Encounter: Payer: Self-pay | Admitting: Nurse Practitioner

## 2012-09-22 ENCOUNTER — Ambulatory Visit (INDEPENDENT_AMBULATORY_CARE_PROVIDER_SITE_OTHER): Payer: Medicare Other | Admitting: Nurse Practitioner

## 2012-09-22 VITALS — BP 122/68 | HR 62 | Temp 97.9°F | Resp 14 | Ht 62.0 in | Wt 158.0 lb

## 2012-09-22 DIAGNOSIS — J189 Pneumonia, unspecified organism: Secondary | ICD-10-CM

## 2012-09-22 DIAGNOSIS — E785 Hyperlipidemia, unspecified: Secondary | ICD-10-CM

## 2012-09-22 DIAGNOSIS — I4891 Unspecified atrial fibrillation: Secondary | ICD-10-CM

## 2012-09-22 DIAGNOSIS — I10 Essential (primary) hypertension: Secondary | ICD-10-CM

## 2012-09-22 DIAGNOSIS — D62 Acute posthemorrhagic anemia: Secondary | ICD-10-CM

## 2012-09-22 MED ORDER — LANSOPRAZOLE 30 MG PO CPDR: 30.0000 mg | DELAYED_RELEASE_CAPSULE | Freq: Every day | ORAL | Status: DC

## 2012-09-22 MED ORDER — SACCHAROMYCES BOULARDII 250 MG PO CAPS: 250.0000 mg | ORAL_CAPSULE | Freq: Two times a day (BID) | ORAL | Status: DC

## 2012-09-22 NOTE — Patient Instructions (Addendum)
Please follow up with Dr Chilton Si in 4-6 weeks with blood work before visit.    Regarding   Blood pressure : stop Metoprolol and restart Benicar 10 mg  if blood pressure remains low (<90/60) call the office     Respiratory Infection: cont levaquin- start taking Mucinex DM with full glass of water twice daily for 2 weeks    Cholesterol: may cont Lipitor   Afib: Do NOT restart praxada; Start ASA 81 mg daily    GI health: cont prevacid daily everyday and Florastore twice daily for 2 weeks.

## 2012-09-22 NOTE — Progress Notes (Signed)
Patient ID: [name], female   DOB: Sep 21, 1916, 79 y.o.   MRN: [id number]   Allergies  Allergen Reactions  . Noroxin (Norfloxacin) Swelling  . Sulfa Antibiotics Nausea And Vomiting  . Prozac (Fluoxetine Hcl) Rash    Chief Complaint  Patient presents with  . Medical Managment of Chronic Issues    Follow up from [facility]    HPI: Patient is a 79 y.o. female seen in the office today for follow up hospitalization and low blood pressure. Home health nurse noted blood pressure in both arms below 90/60 (86/60) -- therefore her blood pressure medications have been on hold since which was 1 day ago. Lopressor was added in the hospital. previously was taking benicar 10 mg   Was discharged from nursing home on Friday ( 5 days ago) was started on Levaquin for possible pneumonia  which she will be on until 09/25/12-- still has cough, feels like she needs to cough up sputum but is not able to -- not taking an expectant, no shortness of breath, no fever or chills-- having nausea since she has been on the Levaquin, no diarrhea   Currently doing well at home. Son with her at todays visit. Pt is now working with home health therapy  Review of Systems:  ROS DATA OBTAINED: from patient and family member GENERAL: Feels well no fevers, fatigue, appetite changes SKIN: No itching, rash or wounds EYES: No eye pain, redness, discharge EARS: No earache, tinnitus, change in hearing NOSE: No congestion, drainage or bleeding  RESPIRATORY: has cough, no wheezing or SOB CARDIAC: No chest pain, palpitations, lower extremity edema  GI: No abdominal pain, No N/V/D or constipation, No heartburn or reflux  GU: No dysuria, frequency or urgency  MUSCULOSKELETAL: No unrelieved bone/joint pain NEUROLOGIC: Awake, alert, appropriate to situation, No change in mental status. Moves all four, no focal deficits PSYCHIATRIC: No overt anxiety or sadness. Sleeps well. No behavior issue.  AMBULATION:  With wheelchair-  however she is working with therapies and now able to walk with a walker while someone is with the wheelchair behind her.   Past Medical History  Diagnosis Date  . Diabetes type 2, uncontrolled   . Bronchitis, acute   . Anemia   . Gait disorder   . Hypertension   . Other disorder of calcium metabolism   . Myoclonus   . Hypothyroid   . Mild memory disturbance   . Hyperlipidemia   . Scoliosis   . Osteoporosis   . Eczema   . Hemoptysis   . Hyperpotassemia   . Unspecified hypertrophic and atrophic condition of skin   . Seborrheic keratosis   . Urinary tract infection, site not specified   . Hematuria, unspecified   . Open wound of knee, leg (except thigh), and ankle, without mention of complication   . Personal history of fall   . Myoclonus   . Chronic kidney disease, unspecified   . Varicose veins of lower extremities with inflammation   . Trigger finger (acquired)   . Diastasis of muscle   . Type II or unspecified type diabetes mellitus without mention of complication, not stated as uncontrolled   . Chest pain, unspecified   . Closed fracture of lumbar vertebra without mention of spinal cord injury   . Personality change due to conditions classified elsewhere   . Closed dislocation, thoracic vertebra   . Carpal tunnel syndrome   . Other specified idiopathic peripheral neuropathy   . Unspecified constipation   . Unspecified  glaucoma(365.9)   . Macular degeneration (senile) of retina, unspecified   . Unspecified urinary incontinence   . Unspecified pruritic disorder   . Unspecified closed fracture of pelvis   . Unspecified vitamin D deficiency   . Other atopic dermatitis and related conditions   . Other disorder of calcium metabolism   . Diabetes with renal manifestations(250.4)   . Arthralgia of temporomandibular joint   . Insomnia, unspecified   . Obesity, unspecified   . Allergic rhinitis, cause unspecified   . Atrial fibrillation   . Unspecified hereditary and  idiopathic peripheral neuropathy   . Arthropathy, unspecified, site unspecified    Past Surgical History  Procedure Laterality Date  . Abdominal hysterectomy      1973  . Coronary artery bypass graft      Dr Tyrone Sage 04/1996  . Biopsy breast      2000  . Knee arthroscopy      left Dr Thurston Hole   . Lumbar spine surgery      Dr Newell Coral   . Laser laparoscopy      right eye 04/2005  . Right oophorectomy      1950  . Esophagogastroduodenoscopy N/A 08/17/2012    Procedure: ESOPHAGOGASTRODUODENOSCOPY (EGD);  Surgeon: Hilarie Fredrickson, MD;  Location: [facility];  Service: Endoscopy;  Laterality: N/A;  . Hot hemostasis N/A 08/17/2012    Procedure: HOT HEMOSTASIS (ARGON PLASMA COAGULATION/BICAP);  Surgeon: Hilarie Fredrickson, MD;  Location: [facility];  Service: Endoscopy;  Laterality: N/A;   Social History:   reports that she has never smoked. She has never used smokeless tobacco. She reports that she does not drink alcohol or use illicit drugs.  Family History  Problem Relation Age of Onset  . Heart disease Mother   . Cancer Sister     Medications: Patient's Medications  New Prescriptions   No medications on file  Previous Medications   CALCITONIN, SALMON, (MIACALCIN/FORTICAL) 200 UNIT/ACT NASAL SPRAY    INSTILL 1 SPRAY INTO ALTERNATING NOSTRILS ONCE DAILY FOR OSTEOPROSIS   COMBIGAN 0.2-0.5 % OPHTHALMIC SOLUTION    One drop left eye twice daily   CYCLOSPORINE (RESTASIS) 0.05 % OPHTHALMIC EMULSION    Place 1 drop into both eyes 2 (two) times daily.   DICLOFENAC SODIUM (VOLTAREN) 1 % GEL    Apply topically. Apply twice a day to legs   FERROUS SULFATE 325 (65 FE) MG TABLET    Take 1 tablet (325 mg total) by mouth 2 (two) times daily with a meal.   GABAPENTIN (NEURONTIN) 600 MG TABLET    Take one tablet by mouth at bedtime   LANSOPRAZOLE (PREVACID) 30 MG CAPSULE    Take 30 mg by mouth daily.   LEVOFLOXACIN (LEVAQUIN) 500 MG TABLET    Take 500 mg by mouth daily.   LEVOTHYROXINE (SYNTHROID,  LEVOTHROID) 88 MCG TABLET    Take 88 mcg by mouth daily before breakfast.   METFORMIN (GLUCOPHAGE) 1000 MG TABLET    take 1 tablet by mouth twice a day TO CONTROL DIABETES   METOPROLOL TARTRATE (LOPRESSOR) 25 MG TABLET    Take 1 tablet (25 mg total) by mouth 2 (two) times daily.   MULTIPLE VITAMINS-MINERALS (CENTRUM SILVER PO)    Take 1 tablet by mouth daily.   OLMESARTAN (BENICAR) 20 MG TABLET    Take 10 mg by mouth daily. Takes 1/2 tablet   PRAVASTATIN (PRAVACHOL) 80 MG TABLET    Take 80 mg by mouth daily.   TRAMADOL (ULTRAM) 50 MG  TABLET    Take 50 mg by mouth every 12 (twelve) hours as needed for pain.    TRAVOPROST, BENZALKONIUM, (TRAVATAN) 0.004 % OPHTHALMIC SOLUTION    Place 1 drop into the left eye at bedtime.   TROSPIUM CHLORIDE (SANCTURA XR) 60 MG CP24    Take one tablet once daily   VITAMIN C (ASCORBIC ACID) 250 MG TABLET    Take 250 mg by mouth daily.   ZOLPIDEM (AMBIEN) 5 MG TABLET    Take 5 mg by mouth at bedtime as needed for sleep.  Modified Medications   No medications on file  Discontinued Medications   ATORVASTATIN (LIPITOR) 20 MG TABLET    take 1 tablet by mouth once daily to LOWER cholesterol   FUROSEMIDE (LASIX) 20 MG TABLET       GABAPENTIN (NEURONTIN) 300 MG CAPSULE    Take 600 mg by mouth at bedtime.   LEVOTHYROXINE (SYNTHROID, LEVOTHROID) 75 MCG TABLET    TAKE 1 TABLET BY MOUTH ONCE DAILY FOR THYROID SUPPLEMENT   LYRICA 75 MG CAPSULE    TAKE 1 CAPSULE BY MOUTH TWICE A DAY TO HELP PAINS AND NERVES   PANTOPRAZOLE (PROTONIX) 40 MG TABLET    Take 1 tablet (40 mg total) by mouth daily.   PRADAXA 150 MG CAPS       TROSPIUM CHLORIDE 60 MG CP24    Take 1 capsule by mouth daily.   ZOLPIDEM (AMBIEN) 10 MG TABLET    Take 5 mg by mouth at bedtime as needed for sleep.      Physical Exam:  Filed Vitals:   09/22/12 1504  BP: 122/68  Pulse: 62  Temp: 97.9 F (36.6 C)  TempSrc: Oral  Resp: 14  Height: 5\' 2"  (1.575 m)  Weight: 158 lb (71.668 kg)    Physical Exam   Vitals reviewed. Constitutional: She is well-developed, well-nourished, and in no distress. No distress.  HENT:  Head: Normocephalic and atraumatic.  Mouth/Throat: Oropharynx is clear and moist. No oropharyngeal exudate.  Eyes: Conjunctivae and EOM are normal. Pupils are equal, round, and reactive to light.  Neck: Normal range of motion. Neck supple.  Cardiovascular: Normal rate, regular rhythm and normal heart sounds.   Pulmonary/Chest: Effort normal. No respiratory distress. She has wheezes.  Abdominal: Soft. Bowel sounds are normal. She exhibits no distension. There is no tenderness.  Musculoskeletal: She exhibits no edema and no tenderness.  Neurological: She is alert.  Skin: Skin is warm and dry. She is not diaphoretic. No erythema.     Labs reviewed: Basic Metabolic Panel:  Recent Labs  16/10/96 1544  08/14/12 1548  08/17/12 0510 08/18/12 0436 08/19/12 0510  NA 142  < >  --   < > 139 139 143  K 4.6  < >  --   < > 4.1 4.1 4.3  CL 104  < >  --   < > 105 107 107  CO2 28  < >  --   < > 29 28 29   GLUCOSE 129*  < >  --   < > 173* 213* 240*  BUN 17  < >  --   < > 42* 29* 22  CREATININE 1.05*  < >  --   < > 1.13* 0.92 1.01  CALCIUM 11.2*  < >  --   < > 9.8 10.0 10.5  TSH 4.690*  --  1.781  --   --   --   --   < > = values  in this interval not displayed. Liver Function Tests:  Recent Labs  08/14/12 1215 08/15/12 0922 08/18/12 0436  AST 20 154* 49*  ALT 12 175* 128*  ALKPHOS 60 46 52  BILITOT 0.2* 0.5 0.2*  PROT 6.5 5.5* 5.7*  ALBUMIN 3.4* 2.8* 2.8*   No results found for this basename: LIPASE, AMYLASE,  in the last 8760 hours No results found for this basename: AMMONIA,  in the last 8760 hours CBC:  Recent Labs  06/09/12 1444 08/14/12 1215  08/18/12 0436 08/19/12 0510 09/13/12 1507  WBC 6.9 12.0*  < > 9.0 7.8 8.9  NEUTROABS 3.6 9.1*  --   --   --  5.7  HGB 13.5 4.0*  < > 7.9* 7.8* 11.2*  HCT 39.6 13.2*  < > 24.3* 24.0* 34.4*  MCV 93 91.7  < > 90.7 92.3  88.3  PLT  --  354  < > 180 172 324.0  < > = values in this interval not displayed. Lipid Panel: No results found for this basename: CHOL, HDL, LDLCALC, TRIG, CHOLHDL, LDLDIRECT,  in the last 8760 hours      Assessment/Plan 1. Pneumonia Currently on Levaquin to finish 10 day course.  - saccharomyces boulardii (FLORASTOR) 250 MG capsule; Take 1 capsule (250 mg total) by mouth 2 (two) times daily.  Dispense: 30 capsule; Refill: 0  2. Essential hypertension, benign With blood pressure being low will stop lopressor but cont benicar- to call the office if the bp remains low - Basic metabolic panel  3. Acute blood loss anemia hgb stable- to cont PPI and iron - lansoprazole (PREVACID) 30 MG capsule; Take 1 capsule (30 mg total) by mouth daily.  Dispense: 30 capsule; Refill: 11 - CBC With differential/Platelet on follow up  4. Afib At this time will not restart pradaxa back due to GI bleed- to start ASA 81 mg daily  Risk benefits discussed with son during visit.  5. Hyperlipidemia On simvastatin but has Lipitor at home and as been on this chronically -- to cont Lipitor   To follow up with Dr Chilton Si in 4-6 weeks with lab work prior to visit

## 2012-09-23 MED ORDER — ATORVASTATIN CALCIUM 20 MG PO TABS: 20.0000 mg | ORAL_TABLET | Freq: Every day | ORAL | Status: DC

## 2012-10-05 ENCOUNTER — Other Ambulatory Visit: Payer: Self-pay | Admitting: Geriatric Medicine

## 2012-10-05 NOTE — Telephone Encounter (Signed)
Home health called an patient had a fall and hurt her ankle. Perform non weight bearing routine and [name] the patient's son is going to call and schedule and appointment.

## 2012-10-07 ENCOUNTER — Emergency Department (HOSPITAL_COMMUNITY): Payer: Medicare Other

## 2012-10-07 ENCOUNTER — Telehealth: Payer: Self-pay | Admitting: Geriatric Medicine

## 2012-10-07 ENCOUNTER — Inpatient Hospital Stay (HOSPITAL_COMMUNITY)
Admission: EM | Admit: 2012-10-07 | Discharge: 2012-10-10 | DRG: 684 | Disposition: A | Discharge status: Home / Self Care | Payer: Medicare Other | Attending: Family Medicine | Admitting: Family Medicine

## 2012-10-07 ENCOUNTER — Encounter (HOSPITAL_COMMUNITY): Payer: Self-pay | Admitting: *Deleted

## 2012-10-07 DIAGNOSIS — R5383 Other fatigue: Secondary | ICD-10-CM | POA: Diagnosis present

## 2012-10-07 DIAGNOSIS — Z951 Presence of aortocoronary bypass graft: Secondary | ICD-10-CM

## 2012-10-07 DIAGNOSIS — Y92009 Unspecified place in unspecified non-institutional (private) residence as the place of occurrence of the external cause: Secondary | ICD-10-CM

## 2012-10-07 DIAGNOSIS — X500XXA Overexertion from strenuous movement or load, initial encounter: Secondary | ICD-10-CM | POA: Diagnosis present

## 2012-10-07 DIAGNOSIS — N058 Unspecified nephritic syndrome with other morphologic changes: Secondary | ICD-10-CM | POA: Diagnosis present

## 2012-10-07 DIAGNOSIS — N189 Chronic kidney disease, unspecified: Secondary | ICD-10-CM

## 2012-10-07 DIAGNOSIS — R4182 Altered mental status, unspecified: Secondary | ICD-10-CM

## 2012-10-07 DIAGNOSIS — Z66 Do not resuscitate: Secondary | ICD-10-CM | POA: Diagnosis present

## 2012-10-07 DIAGNOSIS — R05 Cough: Secondary | ICD-10-CM

## 2012-10-07 DIAGNOSIS — M7989 Other specified soft tissue disorders: Secondary | ICD-10-CM | POA: Diagnosis present

## 2012-10-07 DIAGNOSIS — K5900 Constipation, unspecified: Secondary | ICD-10-CM | POA: Diagnosis present

## 2012-10-07 DIAGNOSIS — I4891 Unspecified atrial fibrillation: Secondary | ICD-10-CM | POA: Diagnosis present

## 2012-10-07 DIAGNOSIS — E1129 Type 2 diabetes mellitus with other diabetic kidney complication: Secondary | ICD-10-CM | POA: Diagnosis present

## 2012-10-07 DIAGNOSIS — E039 Hypothyroidism, unspecified: Secondary | ICD-10-CM | POA: Diagnosis present

## 2012-10-07 DIAGNOSIS — N390 Urinary tract infection, site not specified: Secondary | ICD-10-CM

## 2012-10-07 DIAGNOSIS — M25579 Pain in unspecified ankle and joints of unspecified foot: Secondary | ICD-10-CM | POA: Diagnosis present

## 2012-10-07 DIAGNOSIS — K922 Gastrointestinal hemorrhage, unspecified: Secondary | ICD-10-CM

## 2012-10-07 DIAGNOSIS — E785 Hyperlipidemia, unspecified: Secondary | ICD-10-CM

## 2012-10-07 DIAGNOSIS — R5381 Other malaise: Secondary | ICD-10-CM | POA: Diagnosis present

## 2012-10-07 DIAGNOSIS — F0390 Unspecified dementia without behavioral disturbance: Secondary | ICD-10-CM | POA: Diagnosis present

## 2012-10-07 DIAGNOSIS — E1165 Type 2 diabetes mellitus with hyperglycemia: Secondary | ICD-10-CM | POA: Diagnosis present

## 2012-10-07 DIAGNOSIS — I248 Other forms of acute ischemic heart disease: Secondary | ICD-10-CM

## 2012-10-07 DIAGNOSIS — N289 Disorder of kidney and ureter, unspecified: Secondary | ICD-10-CM

## 2012-10-07 DIAGNOSIS — K5520 Angiodysplasia of colon without hemorrhage: Secondary | ICD-10-CM

## 2012-10-07 DIAGNOSIS — R531 Weakness: Secondary | ICD-10-CM

## 2012-10-07 DIAGNOSIS — R413 Other amnesia: Secondary | ICD-10-CM

## 2012-10-07 DIAGNOSIS — E860 Dehydration: Secondary | ICD-10-CM

## 2012-10-07 DIAGNOSIS — I2510 Atherosclerotic heart disease of native coronary artery without angina pectoris: Secondary | ICD-10-CM | POA: Diagnosis present

## 2012-10-07 DIAGNOSIS — E119 Type 2 diabetes mellitus without complications: Secondary | ICD-10-CM

## 2012-10-07 DIAGNOSIS — S93409A Sprain of unspecified ligament of unspecified ankle, initial encounter: Secondary | ICD-10-CM | POA: Diagnosis present

## 2012-10-07 DIAGNOSIS — K2990 Gastroduodenitis, unspecified, without bleeding: Secondary | ICD-10-CM

## 2012-10-07 DIAGNOSIS — R630 Anorexia: Secondary | ICD-10-CM | POA: Diagnosis present

## 2012-10-07 DIAGNOSIS — G609 Hereditary and idiopathic neuropathy, unspecified: Secondary | ICD-10-CM

## 2012-10-07 DIAGNOSIS — N179 Acute kidney failure, unspecified: Principal | ICD-10-CM | POA: Diagnosis present

## 2012-10-07 DIAGNOSIS — I480 Paroxysmal atrial fibrillation: Secondary | ICD-10-CM | POA: Diagnosis present

## 2012-10-07 DIAGNOSIS — D649 Anemia, unspecified: Secondary | ICD-10-CM

## 2012-10-07 DIAGNOSIS — I10 Essential (primary) hypertension: Secondary | ICD-10-CM

## 2012-10-07 DIAGNOSIS — E875 Hyperkalemia: Secondary | ICD-10-CM | POA: Diagnosis present

## 2012-10-07 LAB — BASIC METABOLIC PANEL
BUN: 31 mg/dL — ABNORMAL HIGH (ref 6–23)
CO2: 31 mEq/L (ref 19–32)
Chloride: 103 mEq/L (ref 96–112)
Creatinine, Ser: 1.58 mg/dL — ABNORMAL HIGH (ref 0.50–1.10)
Glucose, Bld: 96 mg/dL (ref 70–99)

## 2012-10-07 LAB — CBC WITH DIFFERENTIAL/PLATELET
HCT: 33.1 % — ABNORMAL LOW (ref 36.0–46.0)
Hemoglobin: 10.5 g/dL — ABNORMAL LOW (ref 12.0–15.0)
Lymphocytes Relative: 32 % (ref 12–46)
Lymphs Abs: 2.2 10*3/uL (ref 0.7–4.0)
MCV: 88.3 fL (ref 78.0–100.0)
Monocytes Absolute: 0.7 10*3/uL (ref 0.1–1.0)
Monocytes Relative: 11 % (ref 3–12)
Neutro Abs: 3.5 10*3/uL (ref 1.7–7.7)
WBC: 6.9 10*3/uL (ref 4.0–10.5)

## 2012-10-07 LAB — URINALYSIS, ROUTINE W REFLEX MICROSCOPIC
Bilirubin Urine: NEGATIVE
Ketones, ur: NEGATIVE mg/dL
Leukocytes, UA: NEGATIVE
Nitrite: NEGATIVE
Specific Gravity, Urine: 1.014 (ref 1.005–1.030)
Urobilinogen, UA: 0.2 mg/dL (ref 0.0–1.0)

## 2012-10-07 MED ORDER — BRIMONIDINE TARTRATE 0.2 % OP SOLN
1.0000 [drp] | Freq: Two times a day (BID) | OPHTHALMIC | Status: DC
  Administered 2012-10-07 – 2012-10-10 (×6): 1 [drp] via OPHTHALMIC
  Filled 2012-10-07: qty 5

## 2012-10-07 MED ORDER — TRAVOPROST (BAK FREE) 0.004 % OP SOLN
1.0000 [drp] | Freq: Every day | OPHTHALMIC | Status: DC
  Administered 2012-10-07 – 2012-10-09 (×3): 1 [drp] via OPHTHALMIC
  Filled 2012-10-07: qty 2.5

## 2012-10-07 MED ORDER — FERROUS SULFATE 325 (65 FE) MG PO TABS
325.0000 mg | ORAL_TABLET | Freq: Two times a day (BID) | ORAL | Status: DC
  Administered 2012-10-08 – 2012-10-10 (×5): 325 mg via ORAL
  Filled 2012-10-07 (×7): qty 1

## 2012-10-07 MED ORDER — TRAMADOL HCL 50 MG PO TABS: 50.0000 mg | ORAL_TABLET | Freq: Two times a day (BID) | ORAL | Status: DC | PRN

## 2012-10-07 MED ORDER — TRAVOPROST 0.004 % OP SOLN: 1.0000 [drp] | Freq: Every day | OPHTHALMIC | Status: DC

## 2012-10-07 MED ORDER — ONDANSETRON HCL 4 MG PO TABS: 4.0000 mg | ORAL_TABLET | Freq: Four times a day (QID) | ORAL | Status: DC | PRN

## 2012-10-07 MED ORDER — SACCHAROMYCES BOULARDII 250 MG PO CAPS
250.0000 mg | ORAL_CAPSULE | Freq: Two times a day (BID) | ORAL | Status: DC
  Administered 2012-10-07 – 2012-10-10 (×6): 250 mg via ORAL
  Filled 2012-10-07 (×7): qty 1

## 2012-10-07 MED ORDER — TIMOLOL MALEATE 0.5 % OP SOLN
1.0000 [drp] | Freq: Two times a day (BID) | OPHTHALMIC | Status: DC
  Administered 2012-10-07 – 2012-10-10 (×6): 1 [drp] via OPHTHALMIC
  Filled 2012-10-07: qty 5

## 2012-10-07 MED ORDER — ONDANSETRON HCL 4 MG/2ML IJ SOLN: 4.0000 mg | Freq: Four times a day (QID) | INTRAMUSCULAR | Status: DC | PRN

## 2012-10-07 MED ORDER — HYDROCODONE-ACETAMINOPHEN 5-325 MG PO TABS
1.0000 | ORAL_TABLET | ORAL | Status: DC | PRN
  Administered 2012-10-07: 2 via ORAL
  Administered 2012-10-08: 1 via ORAL
  Filled 2012-10-07: qty 2
  Filled 2012-10-07: qty 1

## 2012-10-07 MED ORDER — ZOLPIDEM TARTRATE 5 MG PO TABS: 5.0000 mg | ORAL_TABLET | Freq: Every evening | ORAL | Status: DC | PRN

## 2012-10-07 MED ORDER — CYCLOSPORINE 0.05 % OP EMUL
1.0000 [drp] | Freq: Two times a day (BID) | OPHTHALMIC | Status: DC
  Administered 2012-10-07 – 2012-10-10 (×6): 1 [drp] via OPHTHALMIC
  Filled 2012-10-07 (×7): qty 1

## 2012-10-07 MED ORDER — INSULIN ASPART 100 UNIT/ML ~~LOC~~ SOLN: 0.0000 [IU] | Freq: Every day | SUBCUTANEOUS | Status: DC

## 2012-10-07 MED ORDER — SODIUM CHLORIDE 0.9 % IV SOLN
INTRAVENOUS | Status: DC
  Administered 2012-10-07 – 2012-10-10 (×4): via INTRAVENOUS

## 2012-10-07 MED ORDER — GABAPENTIN 300 MG PO CAPS
600.0000 mg | ORAL_CAPSULE | Freq: Every day | ORAL | Status: DC
  Administered 2012-10-07 – 2012-10-09 (×3): 600 mg via ORAL
  Filled 2012-10-07 (×4): qty 2

## 2012-10-07 MED ORDER — SODIUM CHLORIDE 0.9 % IV BOLUS (SEPSIS)
500.0000 mL | Freq: Once | INTRAVENOUS | Status: AC
  Administered 2012-10-07: 500 mL via INTRAVENOUS

## 2012-10-07 MED ORDER — POLYETHYLENE GLYCOL 3350 17 G PO PACK
17.0000 g | PACK | Freq: Every day | ORAL | Status: DC | PRN
  Filled 2012-10-07 (×2): qty 1

## 2012-10-07 MED ORDER — INSULIN ASPART 100 UNIT/ML ~~LOC~~ SOLN
0.0000 [IU] | Freq: Three times a day (TID) | SUBCUTANEOUS | Status: DC
  Administered 2012-10-09 (×2): 2 [IU] via SUBCUTANEOUS
  Administered 2012-10-09 – 2012-10-10 (×2): 3 [IU] via SUBCUTANEOUS
  Administered 2012-10-10: 2 [IU] via SUBCUTANEOUS

## 2012-10-07 MED ORDER — ACETAMINOPHEN 325 MG PO TABS: 650.0000 mg | ORAL_TABLET | Freq: Four times a day (QID) | ORAL | Status: DC | PRN

## 2012-10-07 MED ORDER — BRIMONIDINE TARTRATE-TIMOLOL 0.2-0.5 % OP SOLN: 1.0000 [drp] | Freq: Two times a day (BID) | OPHTHALMIC | Status: DC

## 2012-10-07 MED ORDER — LEVOTHYROXINE SODIUM 88 MCG PO TABS
88.0000 ug | ORAL_TABLET | Freq: Every day | ORAL | Status: DC
  Administered 2012-10-08 – 2012-10-10 (×3): 88 ug via ORAL
  Filled 2012-10-07 (×4): qty 1

## 2012-10-07 MED ORDER — IRBESARTAN 75 MG PO TABS: 75.0000 mg | ORAL_TABLET | Freq: Every day | ORAL | Status: DC

## 2012-10-07 MED ORDER — ATORVASTATIN CALCIUM 20 MG PO TABS
20.0000 mg | ORAL_TABLET | Freq: Every day | ORAL | Status: DC
  Administered 2012-10-08 – 2012-10-10 (×3): 20 mg via ORAL
  Filled 2012-10-07 (×3): qty 1

## 2012-10-07 MED ORDER — HEPARIN SODIUM (PORCINE) 5000 UNIT/ML IJ SOLN
5000.0000 [IU] | Freq: Three times a day (TID) | INTRAMUSCULAR | Status: DC
  Administered 2012-10-07 – 2012-10-10 (×8): 5000 [IU] via SUBCUTANEOUS
  Filled 2012-10-07 (×11): qty 1

## 2012-10-07 MED ORDER — DOCUSATE SODIUM 100 MG PO CAPS
100.0000 mg | ORAL_CAPSULE | Freq: Two times a day (BID) | ORAL | Status: DC
  Administered 2012-10-07 – 2012-10-10 (×6): 100 mg via ORAL
  Filled 2012-10-07 (×7): qty 1

## 2012-10-07 MED ORDER — PREGABALIN 50 MG PO CAPS
75.0000 mg | ORAL_CAPSULE | Freq: Two times a day (BID) | ORAL | Status: DC
  Administered 2012-10-07 – 2012-10-10 (×6): 75 mg via ORAL
  Filled 2012-10-07 (×6): qty 1

## 2012-10-07 MED ORDER — ACETAMINOPHEN 650 MG RE SUPP: 650.0000 mg | Freq: Four times a day (QID) | RECTAL | Status: DC | PRN

## 2012-10-07 MED ORDER — GABAPENTIN 600 MG PO TABS
600.0000 mg | ORAL_TABLET | Freq: Every day | ORAL | Status: DC
  Filled 2012-10-07: qty 1

## 2012-10-07 MED ORDER — PANTOPRAZOLE SODIUM 40 MG PO TBEC
40.0000 mg | DELAYED_RELEASE_TABLET | Freq: Every day | ORAL | Status: DC
  Administered 2012-10-07 – 2012-10-10 (×4): 40 mg via ORAL
  Filled 2012-10-07 (×4): qty 1

## 2012-10-07 NOTE — Telephone Encounter (Signed)
Patient has not had a regular bowel movement without a stool softener in over two weeks. When she does go its when she takes a stool softener, its a big mess. Is there a medication that the patient can take on a daily basis to help keep her regular? Please advise, thanks.

## 2012-10-07 NOTE — ED Provider Notes (Signed)
Patient brought in by her son who is her caregiver. He states over the past couple weeks she's been getting weaker and getting more lethargic and confused. He states about a week ago they had difficulty transferring her and she may of twisted her left ankle.  Patient is awake, she states she's not having any pain. She said to have diffuse swelling of both legs without deformity. She has some diffuse redness around the eyelid margins. Her tongue looks dry.  Medical screening examination/treatment/procedure(s) were conducted as a shared visit with non-physician practitioner(s) and myself.  I personally evaluated the patient during the encounter  Devoria Albe, MD, Armando GangWard Givens, MD 10/07/12 [phone]

## 2012-10-07 NOTE — ED Notes (Addendum)
Last Friday patient turned her ankle.  Son lives with her and he reports icing it and elevating it since then.  Takes Ultram for pain.  No pain when she is not moving it.  Son reports she was hospitalized with a UTI on June 21st.  She was at [facility] for 4 weeks for rehab.

## 2012-10-07 NOTE — ED Notes (Signed)
Called lab to draw blood. Unsuccessful attempts by ER phlebotomist.

## 2012-10-07 NOTE — H&P (Addendum)
[facility] History and Physical  [name] ZOX:[id number] DOB: 12-31-1916 DOA: 10/07/2012  Referring physician: Emergency Department PCP: Kimber Relic, MD  Specialists:   Chief Complaint: Weakness  HPI: [name] is a 79 y.o. female  With a hx of diabetes, afib, dementia, htn, hypothyroid, and a hx of constipation who presents to the ED with complaints of increased weakness and confusion. The patient's son, who is the POA, initially thought the patient had another UTI. In the ED, a UA was unremarkable, as was a CXR. The patient was also noted to have L ankle pain, thought to have started recently when getting up from a wheelchair. An ankle xray was neg for fracture. The patient was noted to have a mildly elevated BUN and Cr associated with decreased appetite. The hospitalists were consulted for admission  Review of Systems:  Ankle pain, weakness, confustion, remainder of 10pt review of systems reviewed and are negative  Past Medical History  Diagnosis Date  . Diabetes type 2, uncontrolled   . Bronchitis, acute   . Anemia   . Gait disorder   . Hypertension   . Other disorder of calcium metabolism   . Myoclonus   . Hypothyroid   . Mild memory disturbance   . Hyperlipidemia   . Scoliosis   . Osteoporosis   . Eczema   . Hemoptysis   . Hyperpotassemia   . Unspecified hypertrophic and atrophic condition of skin   . Seborrheic keratosis   . Urinary tract infection, site not specified   . Hematuria, unspecified   . Open wound of knee, leg (except thigh), and ankle, without mention of complication   . Personal history of fall   . Myoclonus   . Chronic kidney disease, unspecified   . Varicose veins of lower extremities with inflammation   . Trigger finger (acquired)   . Diastasis of muscle   . Type II or unspecified type diabetes mellitus without mention of complication, not stated as uncontrolled   . Chest pain, unspecified   . Closed fracture of lumbar  vertebra without mention of spinal cord injury   . Personality change due to conditions classified elsewhere   . Closed dislocation, thoracic vertebra   . Carpal tunnel syndrome   . Other specified idiopathic peripheral neuropathy   . Unspecified constipation   . Unspecified glaucoma(365.9)   . Macular degeneration (senile) of retina, unspecified   . Unspecified urinary incontinence   . Unspecified pruritic disorder   . Unspecified closed fracture of pelvis   . Unspecified vitamin D deficiency   . Other atopic dermatitis and related conditions   . Other disorder of calcium metabolism   . Diabetes with renal manifestations(250.4)   . Arthralgia of temporomandibular joint   . Insomnia, unspecified   . Obesity, unspecified   . Allergic rhinitis, cause unspecified   . Atrial fibrillation   . Unspecified hereditary and idiopathic peripheral neuropathy   . Arthropathy, unspecified, site unspecified    Past Surgical History  Procedure Laterality Date  . Abdominal hysterectomy      1973  . Coronary artery bypass graft      Dr Tyrone Sage 04/1996  . Biopsy breast      2000  . Knee arthroscopy      left Dr Thurston Hole   . Lumbar spine surgery      Dr Newell Coral   . Laser laparoscopy      right eye 04/2005  . Right oophorectomy  1950  . Esophagogastroduodenoscopy N/A 08/17/2012    Procedure: ESOPHAGOGASTRODUODENOSCOPY (EGD);  Surgeon: Hilarie Fredrickson, MD;  Location: [facility];  Service: Endoscopy;  Laterality: N/A;  . Hot hemostasis N/A 08/17/2012    Procedure: HOT HEMOSTASIS (ARGON PLASMA COAGULATION/BICAP);  Surgeon: Hilarie Fredrickson, MD;  Location: [facility];  Service: Endoscopy;  Laterality: N/A;   Social History:  reports that she has never smoked. She has never used smokeless tobacco. She reports that  drinks alcohol. She reports that she does not use illicit drugs.  where does patient live--home, ALF, SNF? and with whom if at home?  Can patient participate in ADLs?  Allergies   Allergen Reactions  . Noroxin [Norfloxacin] Swelling  . Sulfa Antibiotics Nausea And Vomiting  . Prozac [Fluoxetine Hcl] Rash    Family History  Problem Relation Age of Onset  . Heart disease Mother   . Cancer Sister     (be sure to complete)  Prior to Admission medications   Medication Sig Start Date End Date Taking? Authorizing Provider  atorvastatin (LIPITOR) 20 MG tablet Take 1 tablet (20 mg total) by mouth daily. 09/23/12  Yes Claudie Revering, NP  calcitonin, salmon, (MIACALCIN/FORTICAL) 200 UNIT/ACT nasal spray INSTILL 1 SPRAY INTO ALTERNATING NOSTRILS ONCE DAILY FOR OSTEOPROSIS 07/10/12  Yes Kimber Relic, MD  COMBIGAN 0.2-0.5 % ophthalmic solution One drop left eye twice daily 08/06/12  Yes Historical Provider, MD  cycloSPORINE (RESTASIS) 0.05 % ophthalmic emulsion Place 1 drop into both eyes 2 (two) times daily.   Yes Historical Provider, MD  ferrous sulfate 325 (65 FE) MG tablet Take 1 tablet (325 mg total) by mouth 2 (two) times daily with a meal. 08/19/12  Yes Belkys A Regalado, MD  gabapentin (NEURONTIN) 600 MG tablet Take one tablet by mouth at bedtime   Yes Historical Provider, MD  lansoprazole (PREVACID) 30 MG capsule Take 1 capsule (30 mg total) by mouth daily. 09/22/12  Yes Claudie Revering, NP  levothyroxine (SYNTHROID, LEVOTHROID) 88 MCG tablet Take 88 mcg by mouth daily before breakfast.   Yes Historical Provider, MD  metFORMIN (GLUCOPHAGE) 1000 MG tablet take 1 tablet by mouth twice a day TO CONTROL DIABETES 06/14/12  Yes Kimber Relic, MD  Multiple Vitamins-Minerals (CENTRUM SILVER PO) Take 1 tablet by mouth daily.   Yes Historical Provider, MD  olmesartan (BENICAR) 20 MG tablet Take 10 mg by mouth daily. Takes 1/2 tablet   Yes Historical Provider, MD  pregabalin (LYRICA) 75 MG capsule Take 75 mg by mouth 2 (two) times daily.   Yes Historical Provider, MD  saccharomyces boulardii (FLORASTOR) 250 MG capsule Take 1 capsule (250 mg total) by mouth 2 (two) times daily.  09/22/12  Yes Claudie Revering, NP  traMADol (ULTRAM) 50 MG tablet Take 50 mg by mouth every 12 (twelve) hours as needed for pain.    Yes Historical Provider, MD  travoprost, benzalkonium, (TRAVATAN) 0.004 % ophthalmic solution Place 1 drop into the left eye at bedtime.   Yes Historical Provider, MD  Trospium Chloride (SANCTURA XR) 60 MG CP24 Take one tablet once daily   Yes Historical Provider, MD  vitamin C (ASCORBIC ACID) 250 MG tablet Take 250 mg by mouth daily.   Yes Historical Provider, MD  zolpidem (AMBIEN) 5 MG tablet Take 5 mg by mouth at bedtime as needed for sleep.   Yes Historical Provider, MD  diclofenac sodium (VOLTAREN) 1 % GEL Apply topically. Apply twice a day to legs    Historical  Provider, MD   Physical Exam: Filed Vitals:   10/07/12 1759  BP: 141/53  Pulse: 75  Temp: 97.7 F (36.5 C)  TempSrc: Oral  Resp: 19  SpO2: 94%     General:  Awake, in nad  Eyes: PERRL B  ENT: membranes moist, dentition fair  Neck: trachea midline, neck supple  Cardiovascular: regular, s1, s2  Respiratory: normal resp effort, no wheezing  Abdomen: soft, nondistended  Skin: normal skin turgor, no abnormal skin lesions  Musculoskeletal: perfused, no clubbing or cyanosis  Psychiatric: mood/affect normal // no auditory/visual hallucinations  Neurologic: cn2-12 grossly intact, strength/sensation intact  Labs on Admission:  Basic Metabolic Panel:  Recent Labs Lab 10/07/12 1834  NA 141  K 5.7*  CL 103  CO2 31  GLUCOSE 96  BUN 31*  CREATININE 1.58*  CALCIUM 11.4*   Liver Function Tests: No results found for this basename: AST, ALT, ALKPHOS, BILITOT, PROT, ALBUMIN,  in the last 168 hours No results found for this basename: LIPASE, AMYLASE,  in the last 168 hours No results found for this basename: AMMONIA,  in the last 168 hours CBC:  Recent Labs Lab 10/07/12 1834  WBC 6.9  NEUTROABS 3.5  HGB 10.5*  HCT 33.1*  MCV 88.3  PLT 275   Cardiac Enzymes: No results  found for this basename: CKTOTAL, CKMB, CKMBINDEX, TROPONINI,  in the last 168 hours  BNP (last 3 results) No results found for this basename: PROBNP,  in the last 8760 hours CBG: No results found for this basename: GLUCAP,  in the last 168 hours  Radiological Exams on Admission: Dg Chest 2 View  10/07/2012   *RADIOLOGY REPORT*  Clinical Data: Rule out pneumonia.  CHEST - 2 VIEW  Comparison: 08/14/2012  Findings: Osteopenia.  Multiple lumbar compression deformities, status post vertebral augmentation.  Prior median sternotomy.  Mild cardiomegaly. No pleural effusion or pneumothorax.  No congestive failure.  Similar left greater than right bibasilar atelectasis.  IMPRESSION: No evidence of pneumonia.  Cardiomegaly with similar bibasilar subsegmental atelectasis.   Original Report Authenticated By: Jeronimo Greaves, M.D.   Dg Ankle Complete Left  10/07/2012   *RADIOLOGY REPORT*  Clinical Data: Left ankle pain  LEFT ANKLE COMPLETE - 3+ VIEW  Comparison: None.  Findings: Generalized soft tissue swelling is identified.  No acute fracture or dislocation is noted.  IMPRESSION: Soft tissue swelling without acute bony abnormality.   Original Report Authenticated By: Alcide Clever, M.D.    Assessment/Plan Principal Problem:   Dehydration Active Problems:   Hypothyroid   Atrial fibrillation   Type II or unspecified type diabetes mellitus with renal manifestations, not stated as uncontrolled(250.40)   Dementia   Weakness   Weakness/dehydration: - Suspect dehydration possibly from decreased appetite secondary to chronic constipation (see below) - Will hydrate gently - Follow renal panel closely - PT/OT consult - Admit to obs  Ankle pain: - Cont pain meds for now - No acute fracture noted on xray  Constipation: - Cont on miralax PRN - May consider sorbitol or mg citrate if no BM  Hyperkalemia: - Suspect secondary to mild renal insufficiency - Fluids overnight - Hold ARB for  now  Hypothyroid: - Will check TSH - Cont thyroid replacement for now  Hx afib - rate controlled. - Cont on tele - Cont meds for now - No anticoagulation per home meds - ?may consider ASA?  DM: - Cont on SSI - Given hx of mild renal insufficiency, would hold metformin  DVT prophylaxis: -  Heparin subQ   Code Status: DNR - discussed w/ pt's POA (must indicate code status--if unknown or must be presumed, indicate so) Family Communication: Pt's son in room (indicate person spoken with, if applicable, with phone number if by telephone) Disposition Plan: Pending (indicate anticipated LOS)  Time spent:  Kimyata Milich, Scheryl Marten [facility] Pager [phone]  If 7PM-7AM, please contact night-coverage [email or web address] Password [facility] 10/07/2012, 8:54 PM

## 2012-10-07 NOTE — ED Notes (Signed)
Labs being drawn by phlebotomy now.

## 2012-10-07 NOTE — ED Notes (Signed)
Admitting physician at bedside

## 2012-10-07 NOTE — ED Provider Notes (Signed)
CSN: [id number]     Arrival date & time 10/07/12  1625 History     First MD Initiated Contact with Patient 10/07/12 1643     Chief Complaint  Patient presents with  . Weakness  . Ankle Pain   (Consider location/radiation/quality/duration/timing/severity/associated sxs/prior Treatment) HPI Comments: Patient is a 79 year old female with a past medical history of diabetes, afib, hypertension, and dementia who presents with progressive generalized weakness over the past week. Symptoms started gradually and progressively worsened. Patient's son is present who provides the history. Patient lives at home with her son. The son reports his mother is "pretty independent" at baseline but lately has been unable to perform activities, such as transfer herself from her wheelchair, without assistance. She has also experienced intermittent episodes of confusion. Patient also complains of left ankle pain after she twisted it while transferring from her wheelchair. She reports aching and severe ankle pain without radiation. Palpation and movement make the pain worse. Nothing makes the pain better. The son reports when she was in a rehab facility she had the same type of confusion and weakness and was diagnosed with a UTI and treated. He thinks this is what is happening now.    Past Medical History  Diagnosis Date  . Diabetes type 2, uncontrolled   . Bronchitis, acute   . Anemia   . Gait disorder   . Hypertension   . Other disorder of calcium metabolism   . Myoclonus   . Hypothyroid   . Mild memory disturbance   . Hyperlipidemia   . Scoliosis   . Osteoporosis   . Eczema   . Hemoptysis   . Hyperpotassemia   . Unspecified hypertrophic and atrophic condition of skin   . Seborrheic keratosis   . Urinary tract infection, site not specified   . Hematuria, unspecified   . Open wound of knee, leg (except thigh), and ankle, without mention of complication   . Personal history of fall   . Myoclonus   .  Chronic kidney disease, unspecified   . Varicose veins of lower extremities with inflammation   . Trigger finger (acquired)   . Diastasis of muscle   . Type II or unspecified type diabetes mellitus without mention of complication, not stated as uncontrolled   . Chest pain, unspecified   . Closed fracture of lumbar vertebra without mention of spinal cord injury   . Personality change due to conditions classified elsewhere   . Closed dislocation, thoracic vertebra   . Carpal tunnel syndrome   . Other specified idiopathic peripheral neuropathy   . Unspecified constipation   . Unspecified glaucoma(365.9)   . Macular degeneration (senile) of retina, unspecified   . Unspecified urinary incontinence   . Unspecified pruritic disorder   . Unspecified closed fracture of pelvis   . Unspecified vitamin D deficiency   . Other atopic dermatitis and related conditions   . Other disorder of calcium metabolism   . Diabetes with renal manifestations(250.4)   . Arthralgia of temporomandibular joint   . Insomnia, unspecified   . Obesity, unspecified   . Allergic rhinitis, cause unspecified   . Atrial fibrillation   . Unspecified hereditary and idiopathic peripheral neuropathy   . Arthropathy, unspecified, site unspecified    Past Surgical History  Procedure Laterality Date  . Abdominal hysterectomy      1973  . Coronary artery bypass graft      Dr Tyrone Sage 04/1996  . Biopsy breast      2000  .  Knee arthroscopy      left Dr Thurston Hole   . Lumbar spine surgery      Dr Newell Coral   . Laser laparoscopy      right eye 04/2005  . Right oophorectomy      1950  . Esophagogastroduodenoscopy N/A 08/17/2012    Procedure: ESOPHAGOGASTRODUODENOSCOPY (EGD);  Surgeon: Hilarie Fredrickson, MD;  Location: [facility];  Service: Endoscopy;  Laterality: N/A;  . Hot hemostasis N/A 08/17/2012    Procedure: HOT HEMOSTASIS (ARGON PLASMA COAGULATION/BICAP);  Surgeon: Hilarie Fredrickson, MD;  Location: [facility];  Service:  Endoscopy;  Laterality: N/A;   Family History  Problem Relation Age of Onset  . Heart disease Mother   . Cancer Sister    History  Substance Use Topics  . Smoking status: Never Smoker   . Smokeless tobacco: Never Used  . Alcohol Use: No   OB History   Grav Para Term Preterm Abortions TAB SAB Ect Mult Living                 Review of Systems  Musculoskeletal: Positive for arthralgias.  Neurological: Positive for weakness.  All other systems reviewed and are negative.    Allergies  Noroxin; Sulfa antibiotics; and Prozac  Home Medications   Current Outpatient Rx  Name  Route  Sig  Dispense  Refill  . atorvastatin (LIPITOR) 20 MG tablet   Oral   Take 1 tablet (20 mg total) by mouth daily.   90 tablet   4   . calcitonin, salmon, (MIACALCIN/FORTICAL) 200 UNIT/ACT nasal spray      INSTILL 1 SPRAY INTO ALTERNATING NOSTRILS ONCE DAILY FOR OSTEOPROSIS   3.7 mL   5   . COMBIGAN 0.2-0.5 % ophthalmic solution      One drop left eye twice daily         . cycloSPORINE (RESTASIS) 0.05 % ophthalmic emulsion   Both Eyes   Place 1 drop into both eyes 2 (two) times daily.         . diclofenac sodium (VOLTAREN) 1 % GEL   Topical   Apply topically. Apply twice a day to legs         . ferrous sulfate 325 (65 FE) MG tablet   Oral   Take 1 tablet (325 mg total) by mouth 2 (two) times daily with a meal.   30 tablet   3   . gabapentin (NEURONTIN) 600 MG tablet      Take one tablet by mouth at bedtime         . lansoprazole (PREVACID) 30 MG capsule   Oral   Take 1 capsule (30 mg total) by mouth daily.   30 capsule   11   . levofloxacin (LEVAQUIN) 500 MG tablet   Oral   Take 500 mg by mouth daily.         Marland Kitchen levothyroxine (SYNTHROID, LEVOTHROID) 88 MCG tablet   Oral   Take 88 mcg by mouth daily before breakfast.         . metFORMIN (GLUCOPHAGE) 1000 MG tablet      take 1 tablet by mouth twice a day TO CONTROL DIABETES   60 tablet   6   . Multiple  Vitamins-Minerals (CENTRUM SILVER PO)   Oral   Take 1 tablet by mouth daily.         Marland Kitchen olmesartan (BENICAR) 20 MG tablet   Oral   Take 10 mg by mouth daily. Takes 1/2 tablet         .  saccharomyces boulardii (FLORASTOR) 250 MG capsule   Oral   Take 1 capsule (250 mg total) by mouth 2 (two) times daily.   30 capsule   0   . traMADol (ULTRAM) 50 MG tablet   Oral   Take 50 mg by mouth every 12 (twelve) hours as needed for pain.          Marland Kitchen travoprost, benzalkonium, (TRAVATAN) 0.004 % ophthalmic solution   Left Eye   Place 1 drop into the left eye at bedtime.         . Trospium Chloride (SANCTURA XR) 60 MG CP24      Take one tablet once daily         . vitamin C (ASCORBIC ACID) 250 MG tablet   Oral   Take 250 mg by mouth daily.         Marland Kitchen zolpidem (AMBIEN) 5 MG tablet   Oral   Take 5 mg by mouth at bedtime as needed for sleep.          There were no vitals taken for this visit. Physical Exam  Nursing note and vitals reviewed. Constitutional: She is oriented to person, place, and time. She appears well-developed and well-nourished. No distress.  HENT:  Head: Normocephalic and atraumatic.  Eyes: Conjunctivae and EOM are normal.  Neck: Normal range of motion.  Cardiovascular: Normal rate and regular rhythm.  Exam reveals no gallop and no friction rub.   No murmur heard. Pulmonary/Chest: Effort normal and breath sounds normal. She has no wheezes. She has no rales. She exhibits no tenderness.  Abdominal: Soft. She exhibits no distension. There is no tenderness. There is no rebound and no guarding.  Musculoskeletal:  Left ankle tender to palpation without obvious deformity. Mild edema to left ankle. ROM limited due to pain.   Neurological: She is alert and oriented to person, place, and time. Coordination normal.  Speech is goal-oriented. Moves limbs without ataxia.   Skin: Skin is warm and dry.  Psychiatric: She has a normal mood and affect. Her behavior is  normal.    ED Course   Procedures (including critical care time)   Date: 10/07/2012  Rate: 75  Rhythm: normal sinus rhythm  QRS Axis: left  Intervals: QT prolonged  ST/T Wave abnormalities: normal  Conduction Disutrbances:right bundle branch block and left anterior fascicular block  Narrative Interpretation: NSR with intraventricular conduction delay unchanged from previous  Old EKG Reviewed: unchanged    Labs Reviewed  CBC WITH DIFFERENTIAL - Abnormal; Notable for the following:    RBC 3.75 (*)    Hemoglobin 10.5 (*)    HCT 33.1 (*)    RDW 16.6 (*)    Eosinophils Relative 7 (*)    All other components within normal limits  BASIC METABOLIC PANEL - Abnormal; Notable for the following:    Potassium 5.7 (*)    BUN 31 (*)    Creatinine, Ser 1.58 (*)    Calcium 11.4 (*)    GFR calc non Af Amer 27 (*)    GFR calc Af Amer 31 (*)    All other components within normal limits  URINE CULTURE  URINALYSIS, ROUTINE W REFLEX MICROSCOPIC  CBC WITH DIFFERENTIAL   No results found. 1. Acute renal insufficiency   2. Hyperkalemia     MDM  4:45 PM Labs, urinalysis, and left ankle xray pending.   7:43 PM Labs show acute renal insufficiency, Urinalysis, chest xray and ankle xray unremarkable. Patient will be admitted to  hospitalist.     Emilia Beck, PA-C 10/07/12 2024

## 2012-10-07 NOTE — Progress Notes (Signed)
Proxy form for My Chart given to patient's son. Briscoe Burns BSN, RN-BC Admissions RN  10/07/2012 8:06 PM

## 2012-10-07 NOTE — ED Provider Notes (Signed)
See prior note   Olivier Frayre L Bernardine Langworthy, MD 10/07/12 2026 

## 2012-10-07 NOTE — ED Notes (Signed)
Pt's son reports pt must've twisted her L ankle, pt reports pain and swelling.  Son also reports possible UTI, pt is weak and confused.

## 2012-10-08 ENCOUNTER — Inpatient Hospital Stay (HOSPITAL_COMMUNITY): Payer: Medicare Other

## 2012-10-08 ENCOUNTER — Encounter (HOSPITAL_COMMUNITY): Payer: Self-pay | Admitting: Radiology

## 2012-10-08 DIAGNOSIS — E875 Hyperkalemia: Secondary | ICD-10-CM

## 2012-10-08 DIAGNOSIS — I10 Essential (primary) hypertension: Secondary | ICD-10-CM

## 2012-10-08 DIAGNOSIS — I4891 Unspecified atrial fibrillation: Secondary | ICD-10-CM

## 2012-10-08 DIAGNOSIS — N289 Disorder of kidney and ureter, unspecified: Secondary | ICD-10-CM

## 2012-10-08 LAB — TSH: TSH: 0.619 u[IU]/mL (ref 0.350–4.500)

## 2012-10-08 LAB — GLUCOSE, CAPILLARY
Glucose-Capillary: 111 mg/dL — ABNORMAL HIGH (ref 70–99)
Glucose-Capillary: 112 mg/dL — ABNORMAL HIGH (ref 70–99)

## 2012-10-08 LAB — COMPREHENSIVE METABOLIC PANEL
Albumin: 3.1 g/dL — ABNORMAL LOW (ref 3.5–5.2)
BUN: 31 mg/dL — ABNORMAL HIGH (ref 6–23)
Chloride: 104 mEq/L (ref 96–112)
Creatinine, Ser: 1.71 mg/dL — ABNORMAL HIGH (ref 0.50–1.10)
Total Bilirubin: 0.2 mg/dL — ABNORMAL LOW (ref 0.3–1.2)

## 2012-10-08 LAB — CBC
MCV: 88.5 fL (ref 78.0–100.0)
Platelets: 254 10*3/uL (ref 150–400)
RBC: 3.39 MIL/uL — ABNORMAL LOW (ref 3.87–5.11)
WBC: 6.1 10*3/uL (ref 4.0–10.5)

## 2012-10-08 MED ORDER — ONDANSETRON HCL 4 MG/2ML IJ SOLN
4.0000 mg | Freq: Three times a day (TID) | INTRAMUSCULAR | Status: AC | PRN
Start: 2012-10-08 — End: 2012-10-08

## 2012-10-08 NOTE — Progress Notes (Signed)
Unable to obtain orthostatic blood pressure(unable to stand up)

## 2012-10-08 NOTE — Telephone Encounter (Signed)
Pt in hospital at this time

## 2012-10-08 NOTE — Progress Notes (Signed)
PT Cancellation Note  Patient Details Name: [name] MRN: [id number] DOB: [birth year]   Cancelled Treatment:     PT deferred this date.  Pt with bedrest order.  Will follow and proceed with evaluation as activity order dictates.   Ayame Rena 10/08/2012, 4:24 PM

## 2012-10-08 NOTE — Progress Notes (Signed)
[facility] PROGRESS NOTE  [name]:[id number] DOB: May 12, 1916 DOA: 10/07/2012 PCP: Kimber Relic, MD  Assessment/Plan: 1. Dehydration- Continue with IV fluids,will follow the BMP in am 2. AKI- Miold elevation in creatinine. Will follow the bmp in am. 3. Hyperkalemia- resolved. 4. Hypothyroidism- continue with synthroid. 5.   Ankle sprain- rest ice  Elevation, Xray of the ankle shows Soft tissue swelling without acute bony abnormality. 6. Atrial fibrillation- EKG shows sinus rhythm, will continue to monitor 7. Hypercalcemia- Chronic elevated,PCP following as outpatient,  will follow the CMP in am.  Code Status: DNR Family Communication: *discussed with son at bedside Disposition Plan: Home when stable   Consultants:  *None  Procedures:  *None  Antibiotics:  None  HPI/Subjective: Patient seen admitted with dehydration, feels better after IV fluids.  Objective: Filed Vitals:   10/08/12 1300  BP: 144/76  Pulse: 72  Temp: 98.7 F (37.1 C)  Resp: 18   No intake or output data in the 24 hours ending 10/08/12 1337 Filed Weights   10/08/12 0500  Weight: 73 kg (160 lb 15 oz)    Exam:   General:  Appear in no acute distress  Cardiovascular: S1s2 RRR  Respiratory: Clear bilaterally  Abdomen: Soft, nontender  Musculoskeletal: Left ankle swollen, tender to palpation, no erythema  Data Reviewed: Basic Metabolic Panel:  Recent Labs Lab 10/07/12 1834 10/08/12 0505  NA 141 142  K 5.7* 4.6  CL 103 104  CO2 31 32  GLUCOSE 96 104*  BUN 31* 31*  CREATININE 1.58* 1.71*  CALCIUM 11.4* 11.2*   Liver Function Tests:  Recent Labs Lab 10/08/12 0505  AST 18  ALT 10  ALKPHOS 70  BILITOT 0.2*  PROT 6.1  ALBUMIN 3.1*   No results found for this basename: LIPASE, AMYLASE,  in the last 168 hours No results found for this basename: AMMONIA,  in the last 168 hours CBC:  Recent Labs Lab 10/07/12 1834 10/08/12 0505  WBC 6.9 6.1   NEUTROABS 3.5  --   HGB 10.5* 9.5*  HCT 33.1* 30.0*  MCV 88.3 88.5  PLT 275 254   Cardiac Enzymes: No results found for this basename: CKTOTAL, CKMB, CKMBINDEX, TROPONINI,  in the last 168 hours BNP (last 3 results) No results found for this basename: PROBNP,  in the last 8760 hours CBG:  Recent Labs Lab 10/07/12 2203 10/08/12 0754  GLUCAP 85 111*    No results found for this or any previous visit (from the past 240 hour(s)).   Studies: Dg Chest 2 View  10/07/2012   *RADIOLOGY REPORT*  Clinical Data: Rule out pneumonia.  CHEST - 2 VIEW  Comparison: 08/14/2012  Findings: Osteopenia.  Multiple lumbar compression deformities, status post vertebral augmentation.  Prior median sternotomy.  Mild cardiomegaly. No pleural effusion or pneumothorax.  No congestive failure.  Similar left greater than right bibasilar atelectasis.  IMPRESSION: No evidence of pneumonia.  Cardiomegaly with similar bibasilar subsegmental atelectasis.   Original Report Authenticated By: Jeronimo Greaves, M.D.   Dg Ankle Complete Left  10/07/2012   *RADIOLOGY REPORT*  Clinical Data: Left ankle pain  LEFT ANKLE COMPLETE - 3+ VIEW  Comparison: None.  Findings: Generalized soft tissue swelling is identified.  No acute fracture or dislocation is noted.  IMPRESSION: Soft tissue swelling without acute bony abnormality.   Original Report Authenticated By: Alcide Clever, M.D.   Ct Head Wo Contrast  10/08/2012   *RADIOLOGY REPORT*  Clinical Data: Increasing weakness, confusion.  Decreased appetite.  CT HEAD WITHOUT CONTRAST  Technique:  Contiguous axial images were obtained from the base of the skull through the vertex without contrast.  Comparison: 08/14/2012  Findings: The ventricles are normal in configuration.  There is ventricular and sulcal enlargement reflecting moderate atrophy.  No hydrocephalus.  There are no parenchymal masses or mass effect.  Patchy areas of white matter hypoattenuation are noted most consistent with  moderate chronic microvascular ischemic change.  There is no evidence of a recent transcortical infarct.  There are no extra-axial masses or abnormal fluid collections.  There is no intracranial hemorrhage.  There is chronic opacification of the right sphenoid sinus.  The remaining visualized sinuses and mastoid air cells are clear.  IMPRESSION: No acute intracranial abnormalities.  No change from prior study.   Original Report Authenticated By: Amie Portland, M.D.    Scheduled Meds: . atorvastatin  20 mg Oral Daily  . brimonidine  1 drop Left Eye BID   And  . timolol  1 drop Left Eye BID  . cycloSPORINE  1 drop Both Eyes BID  . docusate sodium  100 mg Oral BID  . ferrous sulfate  325 mg Oral BID WC  . gabapentin  600 mg Oral QHS  . heparin  5,000 Units Subcutaneous Q8H  . insulin aspart  0-15 Units Subcutaneous TID WC  . insulin aspart  0-5 Units Subcutaneous QHS  . levothyroxine  88 mcg Oral QAC breakfast  . pantoprazole  40 mg Oral Daily  . pregabalin  75 mg Oral BID  . saccharomyces boulardii  250 mg Oral BID  . Travoprost (BAK Free)  1 drop Left Eye QHS   Continuous Infusions: . sodium chloride 75 mL/hr at 10/08/12 1327    Principal Problem:   Dehydration Active Problems:   Hypothyroid   Atrial fibrillation   Type II or unspecified type diabetes mellitus with renal manifestations, not stated as uncontrolled(250.40)   Dementia   Weakness    Time spent: 25 min    [facility]  [facility] Pager [phone]. If 7PM-7AM, please contact night-coverage at [email or web address], password [facility] 10/08/2012, 1:37 PM  LOS: 1 day

## 2012-10-08 NOTE — Progress Notes (Signed)
Gave message to staff that CT Dept has no transporter today and needs patient in xray

## 2012-10-08 NOTE — Progress Notes (Signed)
OT Cancellation Note  Patient Details Name: [name] MRN: [id number] DOB: [birth year]   Cancelled Treatment:    Reason Eval/Treat Not Completed: Other (comment)  Bedrest order supercedes OT/PT order and up with assistance order.  Expires late this afternoon.  Will check tomorrow unless order is changed and schedule permits [place] to check back today.    Jaydynn Wolford 10/08/2012, 12:45 PM Marica Otter, OTR/L [phone] 10/08/2012

## 2012-10-09 DIAGNOSIS — F0390 Unspecified dementia without behavioral disturbance: Secondary | ICD-10-CM

## 2012-10-09 DIAGNOSIS — E119 Type 2 diabetes mellitus without complications: Secondary | ICD-10-CM

## 2012-10-09 LAB — URINE CULTURE

## 2012-10-09 LAB — GLUCOSE, CAPILLARY: Glucose-Capillary: 127 mg/dL — ABNORMAL HIGH (ref 70–99)

## 2012-10-09 MED ORDER — POLYETHYLENE GLYCOL 3350 17 G PO PACK
17.0000 g | PACK | Freq: Every day | ORAL | Status: DC
  Administered 2012-10-09 – 2012-10-10 (×2): 17 g via ORAL
  Filled 2012-10-09 (×2): qty 1

## 2012-10-09 MED ORDER — HALOPERIDOL LACTATE 5 MG/ML IJ SOLN
0.5000 mg | Freq: Four times a day (QID) | INTRAMUSCULAR | Status: DC | PRN
  Administered 2012-10-10: 0.5 mg via INTRAVENOUS
  Filled 2012-10-09: qty 1

## 2012-10-09 NOTE — Progress Notes (Signed)
[facility] PROGRESS NOTE  Assessment/Plan: AKI (acute kidney injury): - multifactorial. Pre-renal. - d/c NSAID's check B-met, strict I and O's. - d/c ARB. - PT consulted.   Weakness - due to #1.    Dementia - haldol PRN.    Type II or unspecified type diabetes mellitus with renal manifestations, not stated as uncontrolled(250.40): - d/c metformin. Once cr at baseline can resume. - cont SSI.  Hypothyroid: - synthroid.   Atrial fibrillation - Rate control. - off pradaxa due to GI bleed. - Follow up with her GI and cardiologist to resume  Code Status: DNR  Family Communication: *discussed with son at bedside  Disposition Plan: Home when stable    Consultants:  none  Procedures:  none  Antibiotics:  none (indicate start date, and stop date if known)  HPI/Subjective: Still feel weak  Objective: Filed Vitals:   10/08/12 0540 10/08/12 1300 10/08/12 2114 10/09/12 0642  BP: 152/78 144/76 142/62 177/76  Pulse: 74 72 77 73  Temp: 97.9 F (36.6 C) 98.7 F (37.1 C) 98 F (36.7 C) 97.7 F (36.5 C)  TempSrc: Oral Oral Oral Oral  Resp: 18 18  18   Height:      Weight:      SpO2: 94% 95% 95% 96%    Intake/Output Summary (Last 24 hours) at 10/09/12 1300 Last data filed at 10/09/12 0823  Gross per 24 hour  Intake   1260 ml  Output      0 ml  Net   1260 ml   Filed Weights   10/08/12 0500  Weight: 73 kg (160 lb 15 oz)    Exam:  General: Alert, awake, oriented x3, in no acute distress.  HEENT: No bruits, no goiter.  Heart: Regular rate and rhythm, without murmurs, rubs, gallops.  Lungs: Good air movement, clear to auscultation. Abdomen: Soft, nontender, nondistended, positive bowel sounds.  Neuro: Grossly intact, nonfocal.   Data Reviewed: Basic Metabolic Panel:  Recent Labs Lab 10/07/12 1834 10/08/12 0505  NA 141 142  K 5.7* 4.6  CL 103 104  CO2 31 32  GLUCOSE 96 104*  BUN 31* 31*  CREATININE 1.58* 1.71*  CALCIUM 11.4* 11.2*    Liver Function Tests:  Recent Labs Lab 10/08/12 0505  AST 18  ALT 10  ALKPHOS 70  BILITOT 0.2*  PROT 6.1  ALBUMIN 3.1*   No results found for this basename: LIPASE, AMYLASE,  in the last 168 hours No results found for this basename: AMMONIA,  in the last 168 hours CBC:  Recent Labs Lab 10/07/12 1834 10/08/12 0505  WBC 6.9 6.1  NEUTROABS 3.5  --   HGB 10.5* 9.5*  HCT 33.1* 30.0*  MCV 88.3 88.5  PLT 275 254   Cardiac Enzymes: No results found for this basename: CKTOTAL, CKMB, CKMBINDEX, TROPONINI,  in the last 168 hours BNP (last 3 results) No results found for this basename: PROBNP,  in the last 8760 hours CBG:  Recent Labs Lab 10/08/12 1159 10/08/12 1720 10/08/12 2146 10/09/12 0716 10/09/12 1131  GLUCAP 119* 112* 137* 127* 133*    Recent Results (from the past 240 hour(s))  URINE CULTURE     Status: None   Collection Time    10/07/12  5:24 PM      Result Value Range Status   Specimen Description URINE, CATHETERIZED   Final   Special Requests Normal   Final   Culture  Setup Time     Final   Value: 10/08/2012 01:25  Performed at [facility] Count     Final   Value: NO GROWTH     Performed at [facility]   Culture     Final   Value: NO GROWTH     Performed at [facility]   Report Status 10/09/2012 FINAL   Final     Studies: Dg Chest 2 View  10/07/2012   *RADIOLOGY REPORT*  Clinical Data: Rule out pneumonia.  CHEST - 2 VIEW  Comparison: 08/14/2012  Findings: Osteopenia.  Multiple lumbar compression deformities, status post vertebral augmentation.  Prior median sternotomy.  Mild cardiomegaly. No pleural effusion or pneumothorax.  No congestive failure.  Similar left greater than right bibasilar atelectasis.  IMPRESSION: No evidence of pneumonia.  Cardiomegaly with similar bibasilar subsegmental atelectasis.   Original Report Authenticated By: Jeronimo Greaves, M.D.   Dg Ankle Complete Left  10/07/2012   *RADIOLOGY  REPORT*  Clinical Data: Left ankle pain  LEFT ANKLE COMPLETE - 3+ VIEW  Comparison: None.  Findings: Generalized soft tissue swelling is identified.  No acute fracture or dislocation is noted.  IMPRESSION: Soft tissue swelling without acute bony abnormality.   Original Report Authenticated By: Alcide Clever, M.D.   Ct Head Wo Contrast  10/08/2012   *RADIOLOGY REPORT*  Clinical Data: Increasing weakness, confusion.  Decreased appetite.  CT HEAD WITHOUT CONTRAST  Technique:  Contiguous axial images were obtained from the base of the skull through the vertex without contrast.  Comparison: 08/14/2012  Findings: The ventricles are normal in configuration.  There is ventricular and sulcal enlargement reflecting moderate atrophy.  No hydrocephalus.  There are no parenchymal masses or mass effect.  Patchy areas of white matter hypoattenuation are noted most consistent with moderate chronic microvascular ischemic change.  There is no evidence of a recent transcortical infarct.  There are no extra-axial masses or abnormal fluid collections.  There is no intracranial hemorrhage.  There is chronic opacification of the right sphenoid sinus.  The remaining visualized sinuses and mastoid air cells are clear.  IMPRESSION: No acute intracranial abnormalities.  No change from prior study.   Original Report Authenticated By: Amie Portland, M.D.    Scheduled Meds: . atorvastatin  20 mg Oral Daily  . brimonidine  1 drop Left Eye BID   And  . timolol  1 drop Left Eye BID  . cycloSPORINE  1 drop Both Eyes BID  . docusate sodium  100 mg Oral BID  . ferrous sulfate  325 mg Oral BID WC  . gabapentin  600 mg Oral QHS  . heparin  5,000 Units Subcutaneous Q8H  . insulin aspart  0-15 Units Subcutaneous TID WC  . insulin aspart  0-5 Units Subcutaneous QHS  . levothyroxine  88 mcg Oral QAC breakfast  . pantoprazole  40 mg Oral Daily  . pregabalin  75 mg Oral BID  . saccharomyces boulardii  250 mg Oral BID  . Travoprost (BAK  Free)  1 drop Left Eye QHS   Continuous Infusions: . sodium chloride 75 mL/hr at 10/09/12 0636     Marinda Elk  [facility] Pager [phone]. If 8PM-8AM, please contact night-coverage at [email or web address], password [facility] 10/09/2012, 1:00 PM  LOS: 2 days

## 2012-10-09 NOTE — Evaluation (Signed)
Occupational Therapy Evaluation Patient Details Name: [name] MRN: [id number] DOB: September 24, 1916 Today's Date: 10/09/2012 Time: 4098-1191 OT Time Calculation (min): 63 min  OT Assessment / Plan / Recommendation History of present illness Pt with present illness of GIB.  Hx of dementia, lives with son and is mostly w/c or chair bound.  Son assists pt to chair via stand step transfer.    Clinical Impression   Pt currently requires +2 assist for transfers. If pt has adequate assist (at least 2 people) may be able to d/c home with [facility] but if not, feel she may require SNF. Will benefit from skilled OT services to maximize ADL independence for d/c to next venue.     OT Assessment  Patient needs continued OT Services    Follow Up Recommendations  Home health OT;SNF;Supervision/Assistance - 24 hour (depending on if pt has enough assist at home)    Barriers to Discharge      Equipment Recommendations  None recommended by OT    Recommendations for Other Services    Frequency  Min 2X/week    Precautions / Restrictions Precautions Precautions: Fall Precaution Comments: L ankle soft tissue swelling Restrictions Weight Bearing Restrictions: No   Pertinent Vitals/Pain Some discomfort L ankle.    ADL  Eating/Feeding: Simulated;Minimal assistance (to drink from cup from bed. setup from chair later) Where Assessed - Eating/Feeding: Bed level;Chair Grooming: Simulated;Wash/dry face;Set up;Supervision/safety Where Assessed - Grooming: Supported sitting Upper Body Bathing: Simulated;Chest;Right arm;Left arm;Abdomen;Moderate assistance (pt with tendancy to lean to the R) Where Assessed - Upper Body Bathing: Unsupported sitting Lower Body Bathing: Simulated;+2 Total assistance Lower Body Bathing: Patient Percentage: 30% Where Assessed - Lower Body Bathing: Supported sit to stand Upper Body Dressing: Simulated;Moderate assistance (with gowns) Where Assessed - Upper Body Dressing:  Unsupported sitting Lower Body Dressing: +2 Total assistance Lower Body Dressing: Patient Percentage: 10% (pt not able to let go of walker to help with clothing ) Where Assessed - Lower Body Dressing: Supported sit to stand Toilet Transfer: Simulated;+2 Total assistance Toilet Transfer: Patient Percentage: 50% Toilet Transfer Method: Other (comment);Sit to stand (sit to stand and took a few steps. chair pulled up) Toileting - Clothing Manipulation and Hygiene: Simulated;+2 Total assistance Toileting - Clothing Manipulation and Hygiene: Patient Percentage: 0% Where Assessed - Toileting Clothing Manipulation and Hygiene: Sit to stand from 3-in-1 or toilet Equipment Used: Rolling walker ADL Comments: Pt tearful intermittantly during session about family issues especially at the beginning. Nursing already aware. Pt with tendancy to lean to the R in sitting at EOB. She was incontinent of urine in bed so assisted to stand and wash up and change gowns.     OT Diagnosis: Generalized weakness  OT Problem List: Decreased strength;Decreased knowledge of use of DME or AE;Impaired balance (sitting and/or standing) OT Treatment Interventions: Self-care/ADL training;DME and/or AE instruction;Therapeutic activities   OT Goals(Current goals can be found in the care plan section) Acute Rehab OT Goals Patient Stated Goal: I want to go home OT Goal Formulation: With patient Time For Goal Achievement: 10/23/12 Potential to Achieve Goals: Good  Visit Information  Last OT Received On: 10/09/12 Assistance Needed: +2 PT/OT Co-Evaluation/Treatment: Yes History of Present Illness: Pt with present illness of GIB.  Hx of dementia, lives with son and is mostly w/c or chair bound.  Son assists pt to chair via stand step transfer.        Prior Functioning     Home Living Family/patient expects to be discharged to:: Private  residence Living Arrangements: Children Available Help at Discharge: Family;Available 24  hours/day;Personal care attendant Type of Home: House Home Access: Ramped entrance Home Layout: One level Home Equipment: Walker - 2 wheels;Wheelchair - manual;Shower seat Additional Comments: pt is w/c bound.  Has 3:1 commode per pt Prior Function Level of Independence: Needs assistance Gait / Transfers Assistance Needed: Pt primarily transfers only - states "when I bounce I fall" ADL's / Homemaking Assistance Needed: assist as needed with adls Comments: Per son, he was assisting her into shower and HH aide was assisting pt with her bathing and dressing.  Son was assisting via stand step transfer from bed to w/c.  Communication Communication: No difficulties         Vision/Perception     Cognition  Cognition Arousal/Alertness: Awake/alert Behavior During Therapy:  (tearful intermittantly during session) Overall Cognitive Status: No family/caregiver present to determine baseline cognitive functioning Memory: Decreased short-term memory    Extremity/Trunk Assessment Upper Extremity Assessment Upper Extremity Assessment: Overall WFL for tasks assessed Lower Extremity Assessment Lower Extremity Assessment: Overall WFL for tasks assessed     Mobility Bed Mobility Bed Mobility: Supine to Sit Supine to Sit: 3: Mod assist;HOB elevated Details for Bed Mobility Assistance: assist with pad to scoot hips around to EOB. Pt able to initiate moving LEs over toward EOB. Transfers Transfers: Sit to Stand;Stand to Sit Sit to Stand: 1: +2 Total assist;From bed;With upper extremity assist Sit to Stand: Patient Percentage: 50% Stand to Sit: 1: +2 Total assist;To chair/3-in-1 Stand to Sit: Patient Percentage: 50% Details for Transfer Assistance: assist to rise and steady. tends to forward flex but with cues can stand more erect but tends to forward flex again quickly. Pt needed assist to descend to chair as she was ready to sit and tried to "plop."      Exercise     Balance  Balance Balance Assessed: Yes Static Sitting Balance Static Sitting - Balance Support: Right upper extremity supported;Feet supported Static Sitting - Level of Assistance: 4: Min assist Static Sitting - Comment/# of Minutes: 8   End of Session OT - End of Session Equipment Utilized During Treatment: Gait belt Activity Tolerance: Patient tolerated treatment well Patient left: in chair;with call bell/phone within reach  GO Functional Assessment Tool Used: clinical judgement Functional Limitation: Self care Self Care Current Status (W0981): At least 80 percent but less than 100 percent impaired, limited or restricted Self Care Goal Status (X9147): At least 40 percent but less than 60 percent impaired, limited or restricted   Lennox Laity [phone] 10/09/2012, 1:08 PM

## 2012-10-09 NOTE — Evaluation (Signed)
Physical Therapy Evaluation Patient Details Name: [name] MRN: [id number] DOB: 03/25/1916 Today's Date: 10/09/2012 Time: 3086-5784 PT Time Calculation (min): 64 min  PT Assessment / Plan / Recommendation History of Present Illness  Pt with present illness of GIB.  Hx of dementia, lives with son and is mostly w/c or chair bound.  Son assists pt to chair via stand step transfer.   Clinical Impression  Pt admitted with dehydration, weakness and L ankle pain (negative for fx on xray).  Pt currently requires significant assist for all mobility tasks and is at high risk for falls with buckling at knees with transfers and limited attempts to step.  Pt hopes to d/c home with family assist but may benefit from follow up rehab at SNF level dependent on acute stay progress and level of assist available at home    PT Assessment  Patient needs continued PT services    Follow Up Recommendations  Home health PT;SNF (dependent on acute stay progress and level of assist at home)    Does the patient have the potential to tolerate intense rehabilitation      Barriers to Discharge        Equipment Recommendations  None recommended by PT    Recommendations for Other Services OT consult   Frequency Min 3X/week    Precautions / Restrictions Precautions Precautions: Fall Precaution Comments: L ankle soft tissue swelling Restrictions Weight Bearing Restrictions: No   Pertinent Vitals/Pain Pt reports L ankle pain but unable to rate and in no obvious distress when attempting to use     Mobility  Bed Mobility Bed Mobility: Supine to Sit Supine to Sit: 3: Mod assist;HOB elevated Details for Bed Mobility Assistance: assist with pad to scoot hips around to EOB. Pt able to initiate moving LEs over toward EOB. Transfers Transfers: Sit to Stand;Stand to Sit Sit to Stand: 1: +2 Total assist;From bed;With upper extremity assist Sit to Stand: Patient Percentage: 50% Stand to Sit: 1: +2 Total  assist;To chair/3-in-1 Stand to Sit: Patient Percentage: 50% Details for Transfer Assistance: assist to rise and steady. tends to forward flex but with cues can stand more erect but tends to forward flex again quickly. Pt needed assist to descend to chair as she was ready to sit and tried to "plop."  Ambulation/Gait Ambulation/Gait Assistance: 1: +2 Total assist Ambulation/Gait: Patient Percentage: 50% Ambulation Distance (Feet): 5 Feet Assistive device: Rolling walker Ambulation/Gait Assistance Details: cues for posture, sequence, stride length and position from RW Gait Pattern: Step-to pattern;Trunk flexed General Gait Details: Pt with frequent buckling at knees requiring assist to maintain upright position Stairs: No    Exercises     PT Diagnosis: Difficulty walking  PT Problem List: Decreased strength;Decreased range of motion;Decreased activity tolerance;Decreased balance;Decreased mobility;Decreased cognition;Decreased knowledge of use of DME;Obesity;Pain PT Treatment Interventions: DME instruction;Gait training;Functional mobility training;Therapeutic activities;Therapeutic exercise;Balance training;Patient/family education     PT Goals(Current goals can be found in the care plan section) Acute Rehab PT Goals Patient Stated Goal: I want to go home PT Goal Formulation: With patient Time For Goal Achievement: 10/23/12 Potential to Achieve Goals: Fair  Visit Information  Last PT Received On: 10/09/12 Assistance Needed: +2 PT/OT Co-Evaluation/Treatment: Yes History of Present Illness: Pt with present illness of GIB.  Hx of dementia, lives with son and is mostly w/c or chair bound.  Son assists pt to chair via stand step transfer.        Prior Functioning  Home Living Family/patient expects to be  discharged to:: Private residence Living Arrangements: Children Available Help at Discharge: Family;Available 24 hours/day;Personal care attendant Type of Home: House Home Access:  Ramped entrance Home Layout: One level Home Equipment: Walker - 2 wheels;Wheelchair - manual;Shower seat Additional Comments: pt is w/c bound.  Has 3:1 commode per pt Prior Function Level of Independence: Needs assistance Gait / Transfers Assistance Needed: Pt primarily transfers only - states "when I bounce I fall" ADL's / Homemaking Assistance Needed: assist as needed with adls Comments: Per son, he was assisting her into shower and HH aide was assisting pt with her bathing and dressing.  Son was assisting via stand step transfer from bed to w/c.  Communication Communication: No difficulties    Cognition  Cognition Arousal/Alertness: Awake/alert Behavior During Therapy:  (tearful intermittantly during session) Overall Cognitive Status: No family/caregiver present to determine baseline cognitive functioning Memory: Decreased short-term memory    Extremity/Trunk Assessment Upper Extremity Assessment Upper Extremity Assessment: Overall WFL for tasks assessed Lower Extremity Assessment Lower Extremity Assessment: Overall WFL for tasks assessed   Balance Balance Balance Assessed: Yes Static Sitting Balance Static Sitting - Balance Support: Right upper extremity supported;Feet supported Static Sitting - Level of Assistance: 4: Min assist Static Sitting - Comment/# of Minutes: 8  End of Session PT - End of Session Equipment Utilized During Treatment: Gait belt Activity Tolerance: Patient tolerated treatment well Patient left: in chair Nurse Communication: Mobility status  GP Functional Assessment Tool Used: clinical judgement Functional Limitation: Mobility: Walking and moving around Mobility: Walking and Moving Around Current Status (U9811): At least 60 percent but less than 80 percent impaired, limited or restricted Mobility: Walking and Moving Around Goal Status [phone]): At least 20 percent but less than 40 percent impaired, limited or restricted   [facility] 10/09/2012,  12:17 PM

## 2012-10-10 LAB — BASIC METABOLIC PANEL
BUN: 17 mg/dL (ref 6–23)
CO2: 24 mEq/L (ref 19–32)
Glucose, Bld: 125 mg/dL — ABNORMAL HIGH (ref 70–99)
Potassium: 3.8 mEq/L (ref 3.5–5.1)
Sodium: 143 mEq/L (ref 135–145)

## 2012-10-10 LAB — GLUCOSE, CAPILLARY: Glucose-Capillary: 121 mg/dL — ABNORMAL HIGH (ref 70–99)

## 2012-10-10 MED ORDER — BISACODYL 10 MG RE SUPP
10.0000 mg | Freq: Every day | RECTAL | Status: DC | PRN
  Administered 2012-10-10: 10 mg via RECTAL
  Filled 2012-10-10: qty 1

## 2012-10-10 MED ORDER — ASPIRIN EC 81 MG PO TBEC: 81.0000 mg | DELAYED_RELEASE_TABLET | Freq: Every day | ORAL | Status: AC

## 2012-10-10 MED ORDER — DSS 100 MG PO CAPS: 100.0000 mg | ORAL_CAPSULE | Freq: Two times a day (BID) | ORAL | Status: DC

## 2012-10-10 MED ORDER — POLYETHYLENE GLYCOL 3350 17 G PO PACK: 17.0000 g | PACK | Freq: Every day | ORAL | Status: DC

## 2012-10-10 MED ORDER — HYDRALAZINE HCL 20 MG/ML IJ SOLN
10.0000 mg | Freq: Once | INTRAMUSCULAR | Status: DC
  Filled 2012-10-10: qty 1

## 2012-10-10 MED ORDER — HYDROCODONE-ACETAMINOPHEN 5-325 MG PO TABS: 1.0000 | ORAL_TABLET | ORAL | Status: DC | PRN

## 2012-10-10 NOTE — Discharge Summary (Signed)
Physician Discharge Summary  [name] ZOX:[id number] DOB: 1916/10/07 DOA: 10/07/2012  PCP: Kimber Relic, MD  Admit date: 10/07/2012 Discharge date: 10/10/2012  Time spent: *50 minutes  Recommendations for Outpatient Follow-up:  1. *Follow up PCP in 2 weeks  Discharge Diagnoses:  Principal Problem:   AKI (acute kidney injury) Active Problems:   Hypothyroid   Atrial fibrillation   Type II or unspecified type diabetes mellitus with renal manifestations, not stated as uncontrolled(250.40)   Dementia   Weakness   Discharge Condition: Stable  Diet recommendation: *Diabetic diet  Filed Weights   10/08/12 0500  Weight: 73 kg (160 lb 15 oz)    History of present illness:  79 y.o. female  With a hx of diabetes, afib, dementia, htn, hypothyroid, and a hx of constipation who presents to the ED with complaints of increased weakness and confusion. The patient's son, who is the POA, initially thought the patient had another UTI. In the ED, a UA was unremarkable, as was a CXR. The patient was also noted to have L ankle pain, thought to have started recently when getting up from a wheelchair. An ankle xray was neg for fracture. The patient was noted to have a mildly elevated BUN and Cr associated with decreased appetite. The hospitalists were consulted for admission   Hospital Course:  *Acute kidney injury-  Resolved, secondary to dehydration. Cr improved after IV fluids.  Weakness- sec to above, resolved, will get HHPT  Diabetes mellitus- continue with metformin.  Hypothyroidism- continue with synthroid  Atrial fibrillation- HR controlled, off pradaxa due to GI bleed.Continue with baby aspirin.  Procedures:  None  Consultations:  none  Discharge Exam: Filed Vitals:   10/10/12 0710  BP: 154/70  Pulse:   Temp:   Resp:     General: Appear in no acute distress Cardiovascular: S1s2 RRR Respiratory: Clear bilaterally Ext ; no edema  Discharge  Instructions  Discharge Orders   Future Appointments Provider Department Dept Phone   10/20/2012 12:00 PM Kimber Relic, MD [facility] [phone]   Future Orders Complete By Expires   Diet - low sodium heart healthy  As directed    Increase activity slowly  As directed        Medication List         atorvastatin 20 MG tablet  Commonly known as:  LIPITOR  Take 1 tablet (20 mg total) by mouth daily.     calcitonin (salmon) 200 UNIT/ACT nasal spray  Commonly known as:  MIACALCIN/FORTICAL  INSTILL 1 SPRAY INTO ALTERNATING NOSTRILS ONCE DAILY FOR OSTEOPROSIS     CENTRUM SILVER PO  Take 1 tablet by mouth daily.     COMBIGAN 0.2-0.5 % ophthalmic solution  Generic drug:  brimonidine-timolol  One drop left eye twice daily     cycloSPORINE 0.05 % ophthalmic emulsion  Commonly known as:  RESTASIS  Place 1 drop into both eyes 2 (two) times daily.     DSS 100 MG Caps  Take 100 mg by mouth 2 (two) times daily.     ferrous sulfate 325 (65 FE) MG tablet  Take 1 tablet (325 mg total) by mouth 2 (two) times daily with a meal.     gabapentin 600 MG tablet  Commonly known as:  NEURONTIN  Take one tablet by mouth at bedtime     HYDROcodone-acetaminophen 5-325 MG per tablet  Commonly known as:  NORCO/VICODIN  Take 1 tablet by mouth every 4 (four) hours as needed.  lansoprazole 30 MG capsule  Commonly known as:  PREVACID  Take 1 capsule (30 mg total) by mouth daily.     levothyroxine 88 MCG tablet  Commonly known as:  SYNTHROID, LEVOTHROID  Take 88 mcg by mouth daily before breakfast.     metFORMIN 1000 MG tablet  Commonly known as:  GLUCOPHAGE  take 1 tablet by mouth twice a day TO CONTROL DIABETES     olmesartan 20 MG tablet  Commonly known as:  BENICAR  Take 10 mg by mouth daily. Takes 1/2 tablet     polyethylene glycol packet  Commonly known as:  MIRALAX / GLYCOLAX  Take 17 g by mouth daily.     pregabalin 75 MG capsule  Commonly known as:  LYRICA   Take 75 mg by mouth 2 (two) times daily.     saccharomyces boulardii 250 MG capsule  Commonly known as:  FLORASTOR  Take 1 capsule (250 mg total) by mouth 2 (two) times daily.     SANCTURA XR 60 MG Cp24  Generic drug:  Trospium Chloride  Take one tablet once daily     traMADol 50 MG tablet  Commonly known as:  ULTRAM  Take 50 mg by mouth every 12 (twelve) hours as needed for pain.     TRAVATAN 0.004 % ophthalmic solution  Generic drug:  travoprost (benzalkonium)  Place 1 drop into the left eye at bedtime.     vitamin C 250 MG tablet  Commonly known as:  ASCORBIC ACID  Take 250 mg by mouth daily.     VOLTAREN 1 % Gel  Generic drug:  diclofenac sodium  Apply topically. Apply twice a day to legs     zolpidem 5 MG tablet  Commonly known as:  AMBIEN  Take 5 mg by mouth at bedtime as needed for sleep.       Allergies  Allergen Reactions  . Noroxin [Norfloxacin] Swelling  . Sulfa Antibiotics Nausea And Vomiting  . Prozac [Fluoxetine Hcl] Rash      The results of significant diagnostics from this hospitalization (including imaging, microbiology, ancillary and laboratory) are listed below for reference.    Significant Diagnostic Studies: Dg Chest 2 View  10/07/2012   *RADIOLOGY REPORT*  Clinical Data: Rule out pneumonia.  CHEST - 2 VIEW  Comparison: 08/14/2012  Findings: Osteopenia.  Multiple lumbar compression deformities, status post vertebral augmentation.  Prior median sternotomy.  Mild cardiomegaly. No pleural effusion or pneumothorax.  No congestive failure.  Similar left greater than right bibasilar atelectasis.  IMPRESSION: No evidence of pneumonia.  Cardiomegaly with similar bibasilar subsegmental atelectasis.   Original Report Authenticated By: Jeronimo Greaves, M.D.   Dg Ankle Complete Left  10/07/2012   *RADIOLOGY REPORT*  Clinical Data: Left ankle pain  LEFT ANKLE COMPLETE - 3+ VIEW  Comparison: None.  Findings: Generalized soft tissue swelling is identified.  No  acute fracture or dislocation is noted.  IMPRESSION: Soft tissue swelling without acute bony abnormality.   Original Report Authenticated By: Alcide Clever, M.D.   Ct Head Wo Contrast  10/08/2012   *RADIOLOGY REPORT*  Clinical Data: Increasing weakness, confusion.  Decreased appetite.  CT HEAD WITHOUT CONTRAST  Technique:  Contiguous axial images were obtained from the base of the skull through the vertex without contrast.  Comparison: 08/14/2012  Findings: The ventricles are normal in configuration.  There is ventricular and sulcal enlargement reflecting moderate atrophy.  No hydrocephalus.  There are no parenchymal masses or mass effect.  Patchy areas of  white matter hypoattenuation are noted most consistent with moderate chronic microvascular ischemic change.  There is no evidence of a recent transcortical infarct.  There are no extra-axial masses or abnormal fluid collections.  There is no intracranial hemorrhage.  There is chronic opacification of the right sphenoid sinus.  The remaining visualized sinuses and mastoid air cells are clear.  IMPRESSION: No acute intracranial abnormalities.  No change from prior study.   Original Report Authenticated By: Amie Portland, M.D.    Microbiology: Recent Results (from the past 240 hour(s))  URINE CULTURE     Status: None   Collection Time    10/07/12  5:24 PM      Result Value Range Status   Specimen Description URINE, CATHETERIZED   Final   Special Requests Normal   Final   Culture  Setup Time     Final   Value: 10/08/2012 01:25     Performed at [facility] Count     Final   Value: NO GROWTH     Performed at [facility]   Culture     Final   Value: NO GROWTH     Performed at [facility]   Report Status 10/09/2012 FINAL   Final     Labs: Basic Metabolic Panel:  Recent Labs Lab 10/07/12 1834 10/08/12 0505 10/10/12 0550  NA 141 142 143  K 5.7* 4.6 3.8  CL 103 104 108  CO2 31 32 24  GLUCOSE 96 104* 125*   BUN 31* 31* 17  CREATININE 1.58* 1.71* 1.17*  CALCIUM 11.4* 11.2* 10.6*   Liver Function Tests:  Recent Labs Lab 10/08/12 0505  AST 18  ALT 10  ALKPHOS 70  BILITOT 0.2*  PROT 6.1  ALBUMIN 3.1*   No results found for this basename: LIPASE, AMYLASE,  in the last 168 hours No results found for this basename: AMMONIA,  in the last 168 hours CBC:  Recent Labs Lab 10/07/12 1834 10/08/12 0505  WBC 6.9 6.1  NEUTROABS 3.5  --   HGB 10.5* 9.5*  HCT 33.1* 30.0*  MCV 88.3 88.5  PLT 275 254   Cardiac Enzymes: No results found for this basename: CKTOTAL, CKMB, CKMBINDEX, TROPONINI,  in the last 168 hours BNP: BNP (last 3 results) No results found for this basename: PROBNP,  in the last 8760 hours CBG:  Recent Labs Lab 10/09/12 0716 10/09/12 1131 10/09/12 1632 10/09/12 2059 10/10/12 0732  GLUCAP 127* 133* 163* 139* 121*       Signed:  Meera Vasco S  [facility] 10/10/2012, 10:33 AM

## 2012-10-10 NOTE — Care Management Note (Signed)
CM spoke with pt at the bedside concerning MD order for HHPT. Per pt currently active with Genevieve Norlander for [employer] services. [facility] liason Gerrit Friends notified of resumption of care order. Pt states having RW for home DME use. Pt resides with adult son. No other barriers to discharge identified.   Roxy Manns Barrington Worley,RN,MSN [phone]

## 2012-10-10 NOTE — Progress Notes (Signed)
Patient discharge home with son, discharge instructions given to son understanding verbalized no additional question at this time.

## 2012-10-11 NOTE — Progress Notes (Signed)
Discharge summary sent to payer through [facility]  

## 2012-10-12 ENCOUNTER — Other Ambulatory Visit: Payer: Self-pay | Admitting: Internal Medicine

## 2012-10-12 NOTE — Telephone Encounter (Signed)
[name] got the info he needed from the hospital. Patient is home now.

## 2012-10-15 ENCOUNTER — Encounter: Payer: Self-pay | Admitting: *Deleted

## 2012-10-15 ENCOUNTER — Encounter: Payer: Self-pay | Admitting: Internal Medicine

## 2012-10-18 ENCOUNTER — Other Ambulatory Visit: Payer: Self-pay | Admitting: *Deleted

## 2012-10-18 ENCOUNTER — Encounter: Payer: Self-pay | Admitting: Internal Medicine

## 2012-10-18 ENCOUNTER — Other Ambulatory Visit: Payer: Medicare Other

## 2012-10-18 ENCOUNTER — Ambulatory Visit (INDEPENDENT_AMBULATORY_CARE_PROVIDER_SITE_OTHER): Payer: Medicare Other | Admitting: Internal Medicine

## 2012-10-18 VITALS — BP 156/70 | HR 74 | Ht 62.0 in | Wt 145.1 lb

## 2012-10-18 DIAGNOSIS — E119 Type 2 diabetes mellitus without complications: Secondary | ICD-10-CM

## 2012-10-18 DIAGNOSIS — E785 Hyperlipidemia, unspecified: Secondary | ICD-10-CM

## 2012-10-18 DIAGNOSIS — K5520 Angiodysplasia of colon without hemorrhage: Secondary | ICD-10-CM

## 2012-10-18 DIAGNOSIS — E039 Hypothyroidism, unspecified: Secondary | ICD-10-CM

## 2012-10-18 DIAGNOSIS — I10 Essential (primary) hypertension: Secondary | ICD-10-CM

## 2012-10-18 DIAGNOSIS — Q2733 Arteriovenous malformation of digestive system vessel: Secondary | ICD-10-CM

## 2012-10-18 DIAGNOSIS — I4891 Unspecified atrial fibrillation: Secondary | ICD-10-CM

## 2012-10-18 NOTE — Progress Notes (Signed)
OFFICE NOTE  Chief Complaint:  Hospital follow-up  Primary Care Physician: Kimber Relic, MD  HPI:  [name]  is a 79 year old female now who has a history of coronary artery bypass more than 15 years ago. Recently had a nuclear stress test which was negative for ischemia. Ejection fraction was 69% and was having some atypical pleuritic-type chest pain. Recently, she has been bothered by a cough and saw Dr. Sherene Sires who feels like she has classic upper airway syndrome. Otherwise, she has lower extremity edema secondary to her saphenous vein harvest and has had weight gain about 16 pounds with a "very good appetite." She had been maintained on Paxil for history of atrial fibrillation, but was admitted recently with a very low hemoglobin of 4. She was found ultimately to have a slow GI bleed and a gastric AVM which was cauterized. She was taken off of present accident has been maintained on low-dose aspirin. After transfusion her hemoglobin has remained stable and despite all this she actually had no evidence of worsening heart failure or angina suggestive of obstructive coronary disease.  PMHx:  Past Medical History  Diagnosis Date  . Diabetes type 2, uncontrolled   . Bronchitis, acute   . Anemia   . Gait disorder   . Hypertension   . Other disorder of calcium metabolism   . Myoclonus   . Hypothyroid   . Mild memory disturbance   . Hyperlipidemia   . Scoliosis   . Osteoporosis   . Eczema   . Hemoptysis   . Hyperpotassemia   . Unspecified hypertrophic and atrophic condition of skin   . Seborrheic keratosis   . Urinary tract infection, site not specified   . Hematuria, unspecified   . Open wound of knee, leg (except thigh), and ankle, without mention of complication   . Personal history of fall   . Myoclonus   . Chronic kidney disease, unspecified   . Varicose veins of lower extremities with inflammation   . Trigger finger (acquired)   . Diastasis of muscle   . Type II  or unspecified type diabetes mellitus without mention of complication, not stated as uncontrolled   . Chest pain, unspecified   . Closed fracture of lumbar vertebra without mention of spinal cord injury   . Personality change due to conditions classified elsewhere   . Closed dislocation, thoracic vertebra   . Carpal tunnel syndrome   . Other specified idiopathic peripheral neuropathy   . Unspecified constipation   . Unspecified glaucoma(365.9)   . Macular degeneration (senile) of retina, unspecified   . Unspecified urinary incontinence   . Unspecified pruritic disorder   . Unspecified closed fracture of pelvis   . Unspecified vitamin D deficiency   . Other atopic dermatitis and related conditions   . Other disorder of calcium metabolism   . Diabetes with renal manifestations(250.4)   . Arthralgia of temporomandibular joint   . Insomnia, unspecified   . Obesity, unspecified   . Allergic rhinitis, cause unspecified   . Atrial fibrillation   . Unspecified hereditary and idiopathic peripheral neuropathy   . Arthropathy, unspecified, site unspecified   . History of nuclear stress test 06/06/2010    lexiscan; normal pattern of perfusion; low risk     Past Surgical History  Procedure Laterality Date  . Abdominal hysterectomy      1973  . Coronary artery bypass graft      Dr Tyrone Sage 04/1996  . Biopsy breast  2000  . Knee arthroscopy      left Dr Thurston Hole   . Lumbar spine surgery      Dr Newell Coral   . Laser laparoscopy      right eye 04/2005  . Right oophorectomy      1950  . Esophagogastroduodenoscopy N/A 08/17/2012    Procedure: ESOPHAGOGASTRODUODENOSCOPY (EGD);  Surgeon: Hilarie Fredrickson, MD;  Location: [facility];  Service: Endoscopy;  Laterality: N/A;  . Hot hemostasis N/A 08/17/2012    Procedure: HOT HEMOSTASIS (ARGON PLASMA COAGULATION/BICAP);  Surgeon: Hilarie Fredrickson, MD;  Location: [facility];  Service: Endoscopy;  Laterality: N/A;    FAMHx:  Family History  Problem  Relation Age of Onset  . Heart disease Mother   . Cancer Sister     SOCHx:   reports that she has never smoked. She has never used smokeless tobacco. She reports that  drinks alcohol. She reports that she does not use illicit drugs.  ALLERGIES:  Allergies  Allergen Reactions  . Noroxin [Norfloxacin] Swelling  . Sulfa Antibiotics Nausea And Vomiting  . Prozac [Fluoxetine Hcl] Rash    ROS: A comprehensive review of systems was negative except for: Cardiovascular: positive for lower extremity edema  HOME MEDS: Current Outpatient Prescriptions  Medication Sig Dispense Refill  . aspirin EC 81 MG tablet Take 1 tablet (81 mg total) by mouth daily.  30 tablet  0  . atorvastatin (LIPITOR) 20 MG tablet Take 1 tablet (20 mg total) by mouth daily.  90 tablet  4  . calcitonin, salmon, (MIACALCIN/FORTICAL) 200 UNIT/ACT nasal spray INSTILL 1 SPRAY INTO ALTERNATING NOSTRILS ONCE DAILY FOR OSTEOPROSIS  3.7 mL  5  . COMBIGAN 0.2-0.5 % ophthalmic solution One drop left eye twice daily      . cycloSPORINE (RESTASIS) 0.05 % ophthalmic emulsion Place 1 drop into both eyes 2 (two) times daily.      Marland Kitchen dextromethorphan-guaiFENesin (MUCINEX DM) 30-600 MG per 12 hr tablet Take 1 tablet by mouth every 12 (twelve) hours.      . diclofenac sodium (VOLTAREN) 1 % GEL Apply topically. Apply twice a day to legs      . docusate sodium 100 MG CAPS Take 100 mg by mouth 2 (two) times daily.  10 capsule  0  . ferrous sulfate 325 (65 FE) MG tablet Take 1 tablet (325 mg total) by mouth 2 (two) times daily with a meal.  30 tablet  3  . gabapentin (NEURONTIN) 300 MG capsule TAKE 2 CAPSULES BY MOUTH AT BEDTIME  60 capsule  5  . HYDROcodone-acetaminophen (NORCO/VICODIN) 5-325 MG per tablet Take 1 tablet by mouth every 4 (four) hours as needed.  30 tablet  0  . lansoprazole (PREVACID) 30 MG capsule Take 1 capsule (30 mg total) by mouth daily.  30 capsule  11  . levothyroxine (SYNTHROID, LEVOTHROID) 88 MCG tablet Take 88 mcg by  mouth daily before breakfast.      . metFORMIN (GLUCOPHAGE) 1000 MG tablet take 1 tablet by mouth twice a day TO CONTROL DIABETES  60 tablet  6  . Multiple Vitamins-Minerals (CENTRUM SILVER PO) Take 1 tablet by mouth daily.      Marland Kitchen olmesartan (BENICAR) 20 MG tablet Take 10 mg by mouth daily. Takes 1/2 tablet      . polyethylene glycol (MIRALAX / GLYCOLAX) packet Take 17 g by mouth daily.  14 each  0  . pregabalin (LYRICA) 75 MG capsule Take 75 mg by mouth 2 (two) times daily.      Marland Kitchen  saccharomyces boulardii (FLORASTOR) 250 MG capsule Take 1 capsule (250 mg total) by mouth 2 (two) times daily.  30 capsule  0  . traMADol (ULTRAM) 50 MG tablet Take 50 mg by mouth every 12 (twelve) hours as needed for pain.       Marland Kitchen travoprost, benzalkonium, (TRAVATAN) 0.004 % ophthalmic solution Place 1 drop into the left eye at bedtime.      . Trospium Chloride (SANCTURA XR) 60 MG CP24 Take one tablet once daily      . vitamin C (ASCORBIC ACID) 250 MG tablet Take 250 mg by mouth daily.      Marland Kitchen zolpidem (AMBIEN) 5 MG tablet Take 5 mg by mouth at bedtime as needed for sleep.       No current facility-administered medications for this visit.    LABS/IMAGING: No results found for this or any previous visit (from the past 48 hour(s)). No results found.  VITALS: BP 156/70  Pulse 74  Ht 5\' 2"  (1.575 m)  Wt 145 lb 1.6 oz (65.817 kg)  BMI 26.53 kg/m2  EXAM: General appearance: alert and no distress Neck: no adenopathy, no carotid bruit, no JVD, supple, symmetrical, trachea midline and thyroid not enlarged, symmetric, no tenderness/mass/nodules Lungs: clear to auscultation bilaterally Heart: regular rate and rhythm, S1, S2 normal, no murmur, click, rub or gallop Abdomen: soft, non-tender; bowel sounds normal; no masses,  no organomegaly Extremities: extremities normal, atraumatic, no cyanosis or edema Pulses: 2+ and symmetric Skin: Skin color, texture, turgor normal. No rashes or lesions Neurologic: Grossly  normal  EKG: Normal sinus rhythm at 74 with a right bundle branch block  ASSESSMENT: 1. Coronary artery disease status post CABG 2. Hypertension 3. Dyslipidemia 4. Diabetes type 2 5. Paroxysmal atrial fibrillation-maintaining sinus on aspirin 6. Recent gastric AVM with life-threatening GI bleeding-taken off of pradaxa  PLAN: 1.   Mrs. Mcsorley unfortunately had a slow gastric AVM which caused life-threatening bleeding. She was transfused and had cauterization of this which stopped the bleeding. Her hemoglobin has remained stable. I agree that she should be off of it per [facility] and question whether or not an alternative novel oral anticoagulant is worthwhile or not. It does not appear that she's had any atrial fibrillation with any frequency therefore I think keeping her on low-dose aspirin is reasonable. She does understand that she is at increased risk for stroke, however when balancing her stroke and bleeding risks I think the risks of bleeding are much greater than the benefit of stroke reduction.  Chrystie Nose, MD, [facility] Attending Cardiologist [facility]  HILTY,Kenneth C 10/18/2012, 12:47 PM

## 2012-10-18 NOTE — Patient Instructions (Addendum)
Your physician wants you to follow-up in: 1 year. You will receive a reminder letter in the mail two months in advance. If you don't receive a letter, please call our office to schedule the follow-up appointment.  

## 2012-10-20 ENCOUNTER — Encounter: Payer: Self-pay | Admitting: Internal Medicine

## 2012-10-20 ENCOUNTER — Ambulatory Visit (INDEPENDENT_AMBULATORY_CARE_PROVIDER_SITE_OTHER): Payer: Medicare Other | Admitting: Internal Medicine

## 2012-10-20 VITALS — BP 150/74 | HR 84 | Temp 97.4°F | Resp 18 | Wt 157.0 lb

## 2012-10-20 DIAGNOSIS — E1129 Type 2 diabetes mellitus with other diabetic kidney complication: Secondary | ICD-10-CM

## 2012-10-20 DIAGNOSIS — L89302 Pressure ulcer of unspecified buttock, stage 2: Secondary | ICD-10-CM

## 2012-10-20 DIAGNOSIS — I10 Essential (primary) hypertension: Secondary | ICD-10-CM

## 2012-10-20 DIAGNOSIS — D649 Anemia, unspecified: Secondary | ICD-10-CM

## 2012-10-20 DIAGNOSIS — E875 Hyperkalemia: Secondary | ICD-10-CM

## 2012-10-20 DIAGNOSIS — L8992 Pressure ulcer of unspecified site, stage 2: Secondary | ICD-10-CM

## 2012-10-20 DIAGNOSIS — E039 Hypothyroidism, unspecified: Secondary | ICD-10-CM

## 2012-10-20 DIAGNOSIS — E785 Hyperlipidemia, unspecified: Secondary | ICD-10-CM

## 2012-10-20 DIAGNOSIS — R05 Cough: Secondary | ICD-10-CM

## 2012-10-20 DIAGNOSIS — L89309 Pressure ulcer of unspecified buttock, unspecified stage: Secondary | ICD-10-CM

## 2012-10-20 LAB — CBC WITH DIFFERENTIAL/PLATELET
Basos: 1 % (ref 0–3)
Eos: 9 % — ABNORMAL HIGH (ref 0–5)
Immature Grans (Abs): 0 10*3/uL (ref 0.0–0.1)
Lymphs: 29 % (ref 14–46)
MCV: 88 fL (ref 79–97)
Monocytes: 11 % (ref 4–12)
Neutrophils Relative %: 50 % (ref 40–74)
RBC: 3.96 x10E6/uL (ref 3.77–5.28)
WBC: 6.5 10*3/uL (ref 3.4–10.8)

## 2012-10-20 LAB — BASIC METABOLIC PANEL
BUN: 20 mg/dL (ref 10–36)
Calcium: 11 mg/dL — ABNORMAL HIGH (ref 8.6–10.2)
Creatinine, Ser: 1.53 mg/dL — ABNORMAL HIGH (ref 0.57–1.00)
GFR calc non Af Amer: 29 mL/min/{1.73_m2} — ABNORMAL LOW (ref >59)
Glucose: 136 mg/dL — ABNORMAL HIGH (ref 65–99)

## 2012-10-20 NOTE — Progress Notes (Signed)
Subjective:    Patient ID: [name], female    DOB: [birth year], 79 y.o.   MRN: [id number]  HPI Hospitalized twice since I last saw her.  Admit date: 08/14/2012 Discharge date: 6/26/201   GI bleed, Duodenal bulb with significantly bleeding AVM.   HTN (hypertension)   Diabetes mellitus   CKD (chronic kidney disease), stage III   Dementia   Altered mental status   UTI (lower urinary tract infection),Klebsiella   Demand ischemia   GI AVM (gastrointestinal arteriovenous vascular malformation)   Unspecified gastritis and gastroduodenitis without mention of hemorrhage   Admit date: 10/07/2012 Discharge date: 10/10/2012 Principal Problem:   AKI (acute kidney injury) Active Problems:   Hypothyroid   Atrial fibrillation   Type II or unspecified type diabetes mellitus with renal manifestations, not stated as uncontrolled(250.40)   Dementia   Weakness    Developed shallow decubitus in hospital last time. There is an abraded area on the left lower leg laterally.  Constipated more since back home  Saw Dr. Rennis Golden:   ASSESSMENT: 1. Coronary artery disease status post CABG 2. Hypertension 3. Dyslipidemia 4. Diabetes type 2 5. Paroxysmal atrial fibrillation-maintaining sinus on aspirin 6. Recent gastric AVM with life-threatening GI bleeding-taken off of pradaxa  PLAN: 1.   [name] unfortunately had a slow gastric AVM which caused life-threatening bleeding. She was transfused and had cauterization of this which stopped the bleeding. Her hemoglobin has remained stable. I agree that she should be off of it per [facility] and question whether or not an alternative novel oral anticoagulant is worthwhile or not. It does not appear that she's had any atrial fibrillation with any frequency therefore I think keeping her on low-dose aspirin is reasonable. She does understand that she is at increased risk for stroke, however when balancing her stroke and bleeding risks I think the risks of bleeding are  much greater than the benefit of stroke reduction.  Chrystie Nose, MD, [facility]   Current Outpatient Prescriptions on File Prior to Visit  Medication Sig Dispense Refill  . aspirin EC 81 MG tablet Take 1 tablet (81 mg total) by mouth daily.  30 tablet  0  . atorvastatin (LIPITOR) 20 MG tablet Take 1 tablet (20 mg total) by mouth daily.  90 tablet  4  . calcitonin, salmon, (MIACALCIN/FORTICAL) 200 UNIT/ACT nasal spray INSTILL 1 SPRAY INTO ALTERNATING NOSTRILS ONCE DAILY FOR OSTEOPROSIS  3.7 mL  5  . COMBIGAN 0.2-0.5 % ophthalmic solution One drop left eye twice daily      . cycloSPORINE (RESTASIS) 0.05 % ophthalmic emulsion Place 1 drop into both eyes 2 (two) times daily.      Marland Kitchen dextromethorphan-guaiFENesin (MUCINEX DM) 30-600 MG per 12 hr tablet Take 1 tablet by mouth every 12 (twelve) hours.      . diclofenac sodium (VOLTAREN) 1 % GEL Apply topically. Apply twice a day to legs      . docusate sodium 100 MG CAPS Take 100 mg by mouth 2 (two) times daily.  10 capsule  0  . ferrous sulfate 325 (65 FE) MG tablet Take 1 tablet (325 mg total) by mouth 2 (two) times daily with a meal.  30 tablet  3  . gabapentin (NEURONTIN) 300 MG capsule TAKE 2 CAPSULES BY MOUTH AT BEDTIME  60 capsule  5  . HYDROcodone-acetaminophen (NORCO/VICODIN) 5-325 MG per tablet Take 1 tablet by mouth every 4 (four) hours as needed.  30 tablet  0  . lansoprazole (PREVACID) 30 MG  capsule Take 1 capsule (30 mg total) by mouth daily.  30 capsule  11  . levothyroxine (SYNTHROID, LEVOTHROID) 88 MCG tablet Take 88 mcg by mouth daily before breakfast.      . metFORMIN (GLUCOPHAGE) 1000 MG tablet take 1 tablet by mouth twice a day TO CONTROL DIABETES  60 tablet  6  . Multiple Vitamins-Minerals (CENTRUM SILVER PO) Take 1 tablet by mouth daily.      Marland Kitchen olmesartan (BENICAR) 20 MG tablet Take 10 mg by mouth daily. Takes 1/2 tablet      . polyethylene glycol (MIRALAX / GLYCOLAX) packet Take 17 g by mouth daily.  14 each  0  . pregabalin  (LYRICA) 75 MG capsule Take 75 mg by mouth 2 (two) times daily.      Marland Kitchen saccharomyces boulardii (FLORASTOR) 250 MG capsule Take 1 capsule (250 mg total) by mouth 2 (two) times daily.  30 capsule  0  . traMADol (ULTRAM) 50 MG tablet Take 50 mg by mouth every 12 (twelve) hours as needed for pain.       Marland Kitchen travoprost, benzalkonium, (TRAVATAN) 0.004 % ophthalmic solution Place 1 drop into the left eye at bedtime.      . Trospium Chloride (SANCTURA XR) 60 MG CP24 Take one tablet once daily      . vitamin C (ASCORBIC ACID) 250 MG tablet Take 250 mg by mouth daily.      Marland Kitchen zolpidem (AMBIEN) 5 MG tablet Take 5 mg by mouth at bedtime as needed for sleep.       No current facility-administered medications on file prior to visit.    Review of Systems  Constitutional: Positive for activity change and fatigue. Negative for fever, chills and unexpected weight change.  HENT: Positive for hearing loss. Negative for ear pain.   Eyes: Negative.        Wears corrective lenses  Respiratory: Positive for cough.        Dry cough for the last year.  Cardiovascular: Positive for leg swelling.  Gastrointestinal: Negative.   Endocrine:       History of diabetes  Genitourinary:       Incontinence  Musculoskeletal: Positive for myalgias, back pain, arthralgias and gait problem.  Skin:       Bedsores since in hospital  Neurological: Positive for weakness and numbness. Negative for dizziness, tremors, seizures, syncope, speech difficulty and light-headedness.       Numb in the feet and the lower legs  Hematological: Negative.   Psychiatric/Behavioral:       Memory loss and associated mild confusion.       Objective:BP 150/74  Pulse 84  Temp(Src) 97.4 F (36.3 C) (Oral)  Resp 18  Wt 157 lb (71.215 kg)  BMI 28.71 kg/m2  SpO2 95%    Physical Exam  Constitutional: She is oriented to person, place, and time. No distress.  Overweight.  HENT:  Severe hearing loss in the right ear  Eyes: Conjunctivae and EOM  are normal. Pupils are equal, round, and reactive to light.  Neck: Normal range of motion. Neck supple. No JVD present. No tracheal deviation present. No thyromegaly present.  Cardiovascular:  Rate controlled irregularly irregular rhythm. No murmur. No gallop.  Pulmonary/Chest: Effort normal. No respiratory distress. She has no wheezes. She has rales. She exhibits no tenderness.  Abdominal: She exhibits no distension and no mass. There is no tenderness.  Musculoskeletal: She exhibits edema. She exhibits no tenderness.  Bilateral knee tenderness and crepitance. Mild back  discomfort.  Lymphadenopathy:    She has no cervical adenopathy.  Neurological: She is alert and oriented to person, place, and time. No cranial nerve deficit. Coordination normal.  Mild memory loss.  Skin: No rash noted. She is not diaphoretic. No erythema. No pallor.  Abraded area left ankle. Shallow decubitus of buttocks near intergluteal fold  Psychiatric: She has a normal mood and affect. Her behavior is normal. Thought content normal.      Appointment on 10/18/2012  Component Date Value Range Status  . WBC 10/18/2012 6.5  3.4 - 10.8 x10E3/uL Final  . RBC 10/18/2012 3.96  3.77 - 5.28 x10E6/uL Final  . Hemoglobin 10/18/2012 11.0* 11.1 - 15.9 g/dL Final  . HCT 29/52/8413 34.9  34.0 - 46.6 % Final  . MCV 10/18/2012 88  79 - 97 fL Final  . MCH 10/18/2012 27.8  26.6 - 33.0 pg Final  . MCHC 10/18/2012 31.5  31.5 - 35.7 g/dL Final  . RDW 24/40/1027 17.5* 12.3 - 15.4 % Final  . Neutrophils Relative % 10/18/2012 50  40 - 74 % Final  . Lymphs 10/18/2012 29  14 - 46 % Final  . Monocytes 10/18/2012 11  4 - 12 % Final  . Eos 10/18/2012 9* 0 - 5 % Final  . Basos 10/18/2012 1  0 - 3 % Final  . Neutrophils Absolute 10/18/2012 3.3  1.4 - 7.0 x10E3/uL Final  . Lymphocytes Absolute 10/18/2012 1.9  0.7 - 3.1 x10E3/uL Final  . Monocytes Absolute 10/18/2012 0.7  0.1 - 0.9 x10E3/uL Final  . Eosinophils Absolute 10/18/2012 0.6* 0.0  - 0.4 x10E3/uL Final  . Basophils Absolute 10/18/2012 0.0  0.0 - 0.2 x10E3/uL Final  . Immature Granulocytes 10/18/2012 0  0 - 2 % Final  . Immature Grans (Abs) 10/18/2012 0.0  0.0 - 0.1 x10E3/uL Final  . Glucose 10/18/2012 136* 65 - 99 mg/dL Final  . BUN 25/36/6440 20  10 - 36 mg/dL Final  . Creatinine, Ser 10/18/2012 1.53* 0.57 - 1.00 mg/dL Final  . GFR calc non Af Amer 10/18/2012 29* >59 mL/min/1.73 Final  . GFR calc Af Amer 10/18/2012 34* >59 mL/min/1.73 Final  . BUN/Creatinine Ratio 10/18/2012 13  11 - 26 Final  . Sodium 10/18/2012 150* 134 - 144 mmol/L Final   Comment: Due to the markedly abnormal result, specimen integrity may be                          questionable. Verified by repeat analysis. Clinical correlation                          indicated.                          **Verified by repeat analysis**  . Potassium 10/18/2012 6.4* 3.5 - 5.2 mmol/L Final  . Chloride 10/18/2012 108  97 - 108 mmol/L Final  . CO2 10/18/2012 19  18 - 29 mmol/L Final  . Calcium 10/18/2012 11.0* 8.6 - 10.2 mg/dL Final  Admission on 34/74/2595, Discharged on 10/10/2012  Component Date Value Range Status  . Color, Urine 10/07/2012 YELLOW  YELLOW Final  . APPearance 10/07/2012 CLEAR  CLEAR Final  . Specific Gravity, Urine 10/07/2012 1.014  1.005 - 1.030 Final  . pH 10/07/2012 5.0  5.0 - 8.0 Final  . Glucose, UA 10/07/2012 NEGATIVE  NEGATIVE mg/dL Final  . Hgb urine dipstick  10/07/2012 NEGATIVE  NEGATIVE Final  . Bilirubin Urine 10/07/2012 NEGATIVE  NEGATIVE Final  . Ketones, ur 10/07/2012 NEGATIVE  NEGATIVE mg/dL Final  . Protein, ur 82/95/6213 NEGATIVE  NEGATIVE mg/dL Final  . Urobilinogen, UA 10/07/2012 0.2  0.0 - 1.0 mg/dL Final  . Nitrite 08/65/7846 NEGATIVE  NEGATIVE Final  . Leukocytes, UA 10/07/2012 NEGATIVE  NEGATIVE Final   MICROSCOPIC NOT DONE ON URINES WITH NEGATIVE PROTEIN, BLOOD, LEUKOCYTES, NITRITE, OR GLUCOSE <1000 mg/dL.  Marland Kitchen Specimen Description 10/07/2012 URINE, CATHETERIZED    Final  . Special Requests 10/07/2012 Normal   Final  . Culture  Setup Time 10/07/2012    Final                   Value:10/08/2012 01:25                         Performed at [facility]  . Colony Count 10/07/2012    Final                   Value:NO GROWTH                         Performed at [facility]  . Culture 10/07/2012    Final                   Value:NO GROWTH                         Performed at [facility]  . Report Status 10/07/2012 10/09/2012 FINAL   Final  . WBC 10/07/2012 6.9  4.0 - 10.5 K/uL Final  . RBC 10/07/2012 3.75* 3.87 - 5.11 MIL/uL Final  . Hemoglobin 10/07/2012 10.5* 12.0 - 15.0 g/dL Final  . HCT 96/29/5284 33.1* 36.0 - 46.0 % Final  . MCV 10/07/2012 88.3  78.0 - 100.0 fL Final  . MCH 10/07/2012 28.0  26.0 - 34.0 pg Final  . MCHC 10/07/2012 31.7  30.0 - 36.0 g/dL Final  . RDW 13/24/4010 16.6* 11.5 - 15.5 % Final  . Platelets 10/07/2012 275  150 - 400 K/uL Final  . Neutrophils Relative % 10/07/2012 51  43 - 77 % Final  . Neutro Abs 10/07/2012 3.5  1.7 - 7.7 K/uL Final  . Lymphocytes Relative 10/07/2012 32  12 - 46 % Final  . Lymphs Abs 10/07/2012 2.2  0.7 - 4.0 K/uL Final  . Monocytes Relative 10/07/2012 11  3 - 12 % Final  . Monocytes Absolute 10/07/2012 0.7  0.1 - 1.0 K/uL Final  . Eosinophils Relative 10/07/2012 7* 0 - 5 % Final  . Eosinophils Absolute 10/07/2012 0.5  0.0 - 0.7 K/uL Final  . Basophils Relative 10/07/2012 0  0 - 1 % Final  . Basophils Absolute 10/07/2012 0.0  0.0 - 0.1 K/uL Final  . Sodium 10/07/2012 141  135 - 145 mEq/L Final  . Potassium 10/07/2012 5.7* 3.5 - 5.1 mEq/L Final  . Chloride 10/07/2012 103  96 - 112 mEq/L Final  . CO2 10/07/2012 31  19 - 32 mEq/L Final  . Glucose, Bld 10/07/2012 96  70 - 99 mg/dL Final  . BUN 27/25/3664 31* 6 - 23 mg/dL Final  . Creatinine, Ser 10/07/2012 1.58* 0.50 - 1.10 mg/dL Final  . Calcium 40/34/7425 11.4* 8.4 - 10.5 mg/dL Final  . GFR calc non Af Amer 10/07/2012 27* >90  mL/min Final  .  GFR calc Af Amer 10/07/2012 31* >90 mL/min Final   Comment: (NOTE)                          The eGFR has been calculated using the CKD EPI equation.                          This calculation has not been validated in all clinical situations.                          eGFR's persistently <90 mL/min signify possible Chronic Kidney                          Disease.  . Sodium 10/08/2012 142  135 - 145 mEq/L Final  . Potassium 10/08/2012 4.6  3.5 - 5.1 mEq/L Final   DELTA CHECK NOTED  . Chloride 10/08/2012 104  96 - 112 mEq/L Final  . CO2 10/08/2012 32  19 - 32 mEq/L Final  . Glucose, Bld 10/08/2012 104* 70 - 99 mg/dL Final  . BUN 96/05/5407 31* 6 - 23 mg/dL Final  . Creatinine, Ser 10/08/2012 1.71* 0.50 - 1.10 mg/dL Final  . Calcium 81/19/1478 11.2* 8.4 - 10.5 mg/dL Final  . Total Protein 10/08/2012 6.1  6.0 - 8.3 g/dL Final  . Albumin 29/56/2130 3.1* 3.5 - 5.2 g/dL Final  . AST 86/57/8469 18  0 - 37 U/L Final  . ALT 10/08/2012 10  0 - 35 U/L Final  . Alkaline Phosphatase 10/08/2012 70  39 - 117 U/L Final  . Total Bilirubin 10/08/2012 0.2* 0.3 - 1.2 mg/dL Final  . GFR calc non Af Amer 10/08/2012 25* >90 mL/min Final  . GFR calc Af Amer 10/08/2012 29* >90 mL/min Final   Comment: (NOTE)                          The eGFR has been calculated using the CKD EPI equation.                          This calculation has not been validated in all clinical situations.                          eGFR's persistently <90 mL/min signify possible Chronic Kidney                          Disease.  . WBC 10/08/2012 6.1  4.0 - 10.5 K/uL Final  . RBC 10/08/2012 3.39* 3.87 - 5.11 MIL/uL Final  . Hemoglobin 10/08/2012 9.5* 12.0 - 15.0 g/dL Final  . HCT 62/95/2841 30.0* 36.0 - 46.0 % Final  . MCV 10/08/2012 88.5  78.0 - 100.0 fL Final  . MCH 10/08/2012 28.0  26.0 - 34.0 pg Final  . MCHC 10/08/2012 31.7  30.0 - 36.0 g/dL Final  . RDW 32/44/0102 16.7* 11.5 - 15.5 % Final  . Platelets 10/08/2012  254  150 - 400 K/uL Final  . TSH 10/08/2012 0.619  0.350 - 4.500 uIU/mL Final   Performed at [facility]  . Glucose-Capillary 10/07/2012 85  70 - 99 mg/dL Final  . Comment 1 72/53/6644 Notify RN   Final  . Comment 2 10/07/2012 Documented in Chart  Final  . Glucose-Capillary 10/08/2012 111* 70 - 99 mg/dL Final  . Comment 1 40/98/1191 Notify RN   Final  . Glucose-Capillary 10/08/2012 119* 70 - 99 mg/dL Final  . Comment 1 47/82/9562 Notify RN   Final  . Glucose-Capillary 10/08/2012 112* 70 - 99 mg/dL Final  . Comment 1 13/09/6576 Notify RN   Final  . Glucose-Capillary 10/08/2012 137* 70 - 99 mg/dL Final  . Glucose-Capillary 10/09/2012 127* 70 - 99 mg/dL Final  . Glucose-Capillary 10/09/2012 133* 70 - 99 mg/dL Final  . Glucose-Capillary 10/09/2012 163* 70 - 99 mg/dL Final  . Comment 1 46/96/2952 Notify RN   Final  . Sodium 10/10/2012 143  135 - 145 mEq/L Final  . Potassium 10/10/2012 3.8  3.5 - 5.1 mEq/L Final  . Chloride 10/10/2012 108  96 - 112 mEq/L Final  . CO2 10/10/2012 24  19 - 32 mEq/L Final  . Glucose, Bld 10/10/2012 125* 70 - 99 mg/dL Final  . BUN 84/13/2440 17  6 - 23 mg/dL Final  . Creatinine, Ser 10/10/2012 1.17* 0.50 - 1.10 mg/dL Final  . Calcium 12/21/2534 10.6* 8.4 - 10.5 mg/dL Final  . GFR calc non Af Amer 10/10/2012 39* >90 mL/min Final  . GFR calc Af Amer 10/10/2012 45* >90 mL/min Final   Comment: (NOTE)                          The eGFR has been calculated using the CKD EPI equation.                          This calculation has not been validated in all clinical situations.                          eGFR's persistently <90 mL/min signify possible Chronic Kidney                          Disease.  . Glucose-Capillary 10/09/2012 139* 70 - 99 mg/dL Final  . Glucose-Capillary 10/10/2012 121* 70 - 99 mg/dL Final  . Glucose-Capillary 10/10/2012 171* 70 - 99 mg/dL Final       Assessment & Plan:  Decubitus ulcer of buttock, stage 2, unspecified laterality:  Healed  Cough: Improving  Anemia: Stable  Hypothyroid: Controlled on supplements   - Plan: TSH  Hyperlipidemia: Followup next visit  Hyperpotassemia : Needs followup lab- Plan: Comprehensive metabolic panel  Type II or unspecified type diabetes mellitus with renal manifestations, not stated as uncontrolled(250.40): controlled  HTN (hypertension): controlled  Anemia, unspecified: Continue to monitor   - Plan: CBC with Differential

## 2012-10-20 NOTE — Patient Instructions (Signed)
Use Tegaderm to skin sores Continue current medication Use Mucinex DM for cough

## 2012-10-26 ENCOUNTER — Other Ambulatory Visit: Payer: Self-pay | Admitting: *Deleted

## 2012-10-26 DIAGNOSIS — J189 Pneumonia, unspecified organism: Secondary | ICD-10-CM

## 2012-10-26 MED ORDER — SACCHAROMYCES BOULARDII 250 MG PO CAPS: 250.0000 mg | ORAL_CAPSULE | Freq: Two times a day (BID) | ORAL | Status: DC

## 2012-11-01 ENCOUNTER — Other Ambulatory Visit: Payer: Medicare Other

## 2012-11-01 DIAGNOSIS — E875 Hyperkalemia: Secondary | ICD-10-CM

## 2012-11-01 DIAGNOSIS — E039 Hypothyroidism, unspecified: Secondary | ICD-10-CM

## 2012-11-01 DIAGNOSIS — D649 Anemia, unspecified: Secondary | ICD-10-CM

## 2012-11-02 LAB — CBC WITH DIFFERENTIAL/PLATELET
Eos: 4 % (ref 0–5)
HCT: 33.9 % — ABNORMAL LOW (ref 34.0–46.6)
Hemoglobin: 10.9 g/dL — ABNORMAL LOW (ref 11.1–15.9)
Immature Granulocytes: 0 % (ref 0–2)
Lymphocytes Absolute: 1.9 10*3/uL (ref 0.7–3.1)
Lymphs: 28 % (ref 14–46)
Monocytes: 7 % (ref 4–12)
Neutrophils Absolute: 3.9 10*3/uL (ref 1.4–7.0)
WBC: 6.6 10*3/uL (ref 3.4–10.8)

## 2012-11-02 LAB — COMPREHENSIVE METABOLIC PANEL
Albumin: 4.1 g/dL (ref 3.2–4.6)
Alkaline Phosphatase: 78 IU/L (ref 39–117)
BUN/Creatinine Ratio: 16 (ref 11–26)
BUN: 19 mg/dL (ref 10–36)
CO2: 21 mmol/L (ref 18–29)
Creatinine, Ser: 1.19 mg/dL — ABNORMAL HIGH (ref 0.57–1.00)
GFR calc Af Amer: 45 mL/min/{1.73_m2} — ABNORMAL LOW (ref >59)
Globulin, Total: 2.7 g/dL (ref 1.5–4.5)
Glucose: 104 mg/dL — ABNORMAL HIGH (ref 65–99)
Total Protein: 6.8 g/dL (ref 6.0–8.5)

## 2012-11-02 LAB — TSH: TSH: 1.4 u[IU]/mL (ref 0.450–4.500)

## 2012-11-10 ENCOUNTER — Other Ambulatory Visit: Payer: Self-pay | Admitting: Internal Medicine

## 2012-11-10 ENCOUNTER — Other Ambulatory Visit: Payer: Self-pay | Admitting: *Deleted

## 2012-11-10 MED ORDER — GLUCOSE BLOOD VI STRP: ORAL_STRIP | Status: DC

## 2012-11-15 ENCOUNTER — Other Ambulatory Visit: Payer: Self-pay | Admitting: Internal Medicine

## 2012-11-15 MED ORDER — TROSPIUM CHLORIDE ER 60 MG PO CP24: ORAL_CAPSULE | ORAL | Status: DC

## 2012-11-19 ENCOUNTER — Telehealth: Payer: Self-pay

## 2012-11-19 NOTE — Telephone Encounter (Signed)
Spoke with patient, informed patient per Dr.Reed he needs an appointment to complete paperwork from [employer]. Patient stated he will have his wife call when she comes over. Patient was given name of facility, doctors name and the phone number. Patient will need to schedule a CPX   Side note- paperwork is in folder where new patient and abstracting papers are located

## 2012-12-06 ENCOUNTER — Other Ambulatory Visit: Payer: Self-pay | Admitting: Internal Medicine

## 2012-12-27 ENCOUNTER — Ambulatory Visit (INDEPENDENT_AMBULATORY_CARE_PROVIDER_SITE_OTHER): Payer: Medicare Other | Admitting: Internal Medicine

## 2012-12-27 ENCOUNTER — Encounter: Payer: Self-pay | Admitting: Internal Medicine

## 2012-12-27 VITALS — BP 134/70 | HR 80 | Temp 98.2°F | Wt 156.6 lb

## 2012-12-27 DIAGNOSIS — L89309 Pressure ulcer of unspecified buttock, unspecified stage: Secondary | ICD-10-CM

## 2012-12-27 DIAGNOSIS — H04552 Acquired stenosis of left nasolacrimal duct: Secondary | ICD-10-CM

## 2012-12-27 DIAGNOSIS — L231 Allergic contact dermatitis due to adhesives: Secondary | ICD-10-CM

## 2012-12-27 DIAGNOSIS — H3530 Unspecified macular degeneration: Secondary | ICD-10-CM

## 2012-12-27 DIAGNOSIS — L89302 Pressure ulcer of unspecified buttock, stage 2: Secondary | ICD-10-CM

## 2012-12-27 DIAGNOSIS — H04559 Acquired stenosis of unspecified nasolacrimal duct: Secondary | ICD-10-CM

## 2012-12-27 DIAGNOSIS — L8992 Pressure ulcer of unspecified site, stage 2: Secondary | ICD-10-CM

## 2012-12-27 DIAGNOSIS — L253 Unspecified contact dermatitis due to other chemical products: Secondary | ICD-10-CM

## 2012-12-27 NOTE — Progress Notes (Signed)
Patient ID: [name], female   DOB: [birth year], 79 y.o.   MRN: [id number] Location:  [facility] / [facility]    Allergies  Allergen Reactions  . Noroxin [Norfloxacin] Swelling  . Sulfa Antibiotics Nausea And Vomiting  . Prozac [Fluoxetine Hcl] Rash    Chief Complaint  Patient presents with  . Acute Visit    compression ulcer on bottom (bleeding)    HPI: Patient is a 79 y.o. white female seen in the office today for a pressure ulcer that came up 6 month ago. Started getting sore again two weeks ago in the same place. Son who is the primary caregiver put bandages with neosporin on the wound but that didn't help. When he took the bandage off, the tape pulled the skin off causing it to bleed. Has still been putting gauze on it but with very little tape. Pt. Was on pradaxa but GI MD took her off and put her on Asprin d/t a GI bleed. Pt. States that it is sore depending on the way she is sitting.   Review of Systems:  Review of Systems  Constitutional: Negative for fever.  HENT: Positive for hearing loss.   Eyes:       Macular degeneration  Musculoskeletal:       Pressure ulcer on her buttocks   Skin: Negative for itching.       Erythema surrounding left eye, no drainage, no warmth, has tenderness, denies visual changes  Endo/Heme/Allergies: Bruises/bleeds easily.       D/t asprin     Past Medical History  Diagnosis Date  . Diabetes type 2, uncontrolled   . Bronchitis, acute   . Anemia   . Gait disorder   . Hypertension   . Other disorder of calcium metabolism   . Myoclonus   . Hypothyroid   . Mild memory disturbance   . Hyperlipidemia   . Scoliosis   . Osteoporosis   . Eczema   . Hemoptysis   . Hyperpotassemia   . Unspecified hypertrophic and atrophic condition of skin   . Seborrheic keratosis   . Urinary tract infection, site not specified   . Hematuria, unspecified   . Open wound of knee, leg (except thigh), and ankle, without  mention of complication   . Personal history of fall   . Myoclonus   . Chronic kidney disease, unspecified   . Varicose veins of lower extremities with inflammation   . Trigger finger (acquired)   . Diastasis of muscle   . Type II or unspecified type diabetes mellitus without mention of complication, not stated as uncontrolled   . Chest pain, unspecified   . Closed fracture of lumbar vertebra without mention of spinal cord injury   . Personality change due to conditions classified elsewhere   . Closed dislocation, thoracic vertebra   . Carpal tunnel syndrome   . Other specified idiopathic peripheral neuropathy   . Unspecified constipation   . Unspecified glaucoma(365.9)   . Macular degeneration (senile) of retina, unspecified   . Unspecified urinary incontinence   . Unspecified pruritic disorder   . Unspecified closed fracture of pelvis   . Unspecified vitamin D deficiency   . Other atopic dermatitis and related conditions   . Other disorder of calcium metabolism   . Diabetes with renal manifestations(250.4)   . Arthralgia of temporomandibular joint   . Insomnia, unspecified   . Obesity, unspecified   . Allergic rhinitis, cause unspecified   . Atrial  fibrillation   . Unspecified hereditary and idiopathic peripheral neuropathy   . Arthropathy, unspecified, site unspecified   . History of nuclear stress test 06/06/2010    lexiscan; normal pattern of perfusion; low risk     Past Surgical History  Procedure Laterality Date  . Abdominal hysterectomy      1973  . Coronary artery bypass graft      Dr Tyrone Sage 04/1996  . Biopsy breast      2000  . Knee arthroscopy      left Dr Thurston Hole   . Lumbar spine surgery      Dr Newell Coral   . Laser laparoscopy      right eye 04/2005  . Right oophorectomy      1950  . Esophagogastroduodenoscopy N/A 08/17/2012    Procedure: ESOPHAGOGASTRODUODENOSCOPY (EGD);  Surgeon: Hilarie Fredrickson, MD;  Location: [facility];  Service: Endoscopy;   Laterality: N/A;  . Hot hemostasis N/A 08/17/2012    Procedure: HOT HEMOSTASIS (ARGON PLASMA COAGULATION/BICAP);  Surgeon: Hilarie Fredrickson, MD;  Location: [facility];  Service: Endoscopy;  Laterality: N/A;    Social History:   reports that she has never smoked. She has never used smokeless tobacco. She reports that she drinks alcohol. She reports that she does not use illicit drugs.  Family History  Problem Relation Age of Onset  . Heart disease Mother   . Cancer Sister     Medications: Patient's Medications  New Prescriptions   No medications on file  Previous Medications   ASPIRIN EC 81 MG TABLET    Take 1 tablet (81 mg total) by mouth daily.   ATORVASTATIN (LIPITOR) 20 MG TABLET    Take 1 tablet (20 mg total) by mouth daily.   CALCITONIN, SALMON, (MIACALCIN/FORTICAL) 200 UNIT/ACT NASAL SPRAY    INSTILL 1 SPRAY INTO ALTERNATING NOSTRILS ONCE DAILY FOR OSTEOPROSIS   COMBIGAN 0.2-0.5 % OPHTHALMIC SOLUTION    One drop left eye twice daily   CYCLOSPORINE (RESTASIS) 0.05 % OPHTHALMIC EMULSION    Place 1 drop into both eyes 2 (two) times daily.   DEXTROMETHORPHAN-GUAIFENESIN (MUCINEX DM) 30-600 MG PER 12 HR TABLET    Take 1 tablet by mouth every 12 (twelve) hours.   DICLOFENAC SODIUM (VOLTAREN) 1 % GEL    Apply topically. Apply twice a day to legs   DOCUSATE SODIUM 100 MG CAPS    Take 100 mg by mouth 2 (two) times daily.   FERROUS SULFATE 325 (65 FE) MG TABLET    Take 1 tablet (325 mg total) by mouth 2 (two) times daily with a meal.   FUROSEMIDE (LASIX) 20 MG TABLET       GABAPENTIN (NEURONTIN) 300 MG CAPSULE    TAKE 2 CAPSULES BY MOUTH AT BEDTIME   GLUCOSE BLOOD (BAYER CONTOUR TEST) TEST STRIP    Test blood sugar twice daily as directed. Dx: 250.00   HYDROCODONE-ACETAMINOPHEN (NORCO/VICODIN) 5-325 MG PER TABLET    Take 1 tablet by mouth every 4 (four) hours as needed.   LANSOPRAZOLE (PREVACID) 30 MG CAPSULE    Take 1 capsule (30 mg total) by mouth daily.   LEVOTHYROXINE (SYNTHROID,  LEVOTHROID) 75 MCG TABLET    TAKE 1 TABLET BY MOUTH ONCE DAILY FOR THYROID SUPPLEMENT   METFORMIN (GLUCOPHAGE) 1000 MG TABLET    take 1 tablet by mouth twice a day TO CONTROL DIABETES   MULTIPLE VITAMINS-MINERALS (CENTRUM SILVER PO)    Take 1 tablet by mouth daily.   NON FORMULARY  Take 1 capsule twice daily to help with Vision   OLMESARTAN (BENICAR) 20 MG TABLET    Take 10 mg by mouth daily. Takes 1/2 tablet   POLYETHYLENE GLYCOL (MIRALAX / GLYCOLAX) PACKET    Take 17 g by mouth daily.   PREGABALIN (LYRICA) 75 MG CAPSULE    Take 75 mg by mouth 2 (two) times daily.   SACCHAROMYCES BOULARDII (FLORASTOR) 250 MG CAPSULE    Take 1 capsule (250 mg total) by mouth 2 (two) times daily.   TOBRAMYCIN-DEXAMETHASONE (TOBRADEX) OPHTHALMIC SOLUTION       TRAMADOL (ULTRAM) 50 MG TABLET    take 1 tablet by mouth every 12 hours if needed for pain   TRAVOPROST, BENZALKONIUM, (TRAVATAN) 0.004 % OPHTHALMIC SOLUTION    Place 1 drop into the left eye at bedtime.   TROSPIUM CHLORIDE (SANCTURA XR) 60 MG CP24    Take one tablet by mouth daily to help bladder control   VITAMIN C (ASCORBIC ACID) 250 MG TABLET    Take 250 mg by mouth daily.   ZOLPIDEM (AMBIEN) 5 MG TABLET    Take 5 mg by mouth at bedtime as needed for sleep.  Modified Medications   No medications on file  Discontinued Medications   LEVOTHYROXINE (SYNTHROID, LEVOTHROID) 88 MCG TABLET    Take 88 mcg by mouth daily before breakfast.   PRADAXA 150 MG CAPS CAPSULE         Physical Exam: Filed Vitals:   12/27/12 1514  BP: 134/70  Pulse: 80  Temp: 98.2 F (36.8 C)  TempSrc: Oral  Weight: 156 lb 9.6 oz (71.033 kg)  SpO2: 97%   Physical Exam  Eyes: EOM are normal. Pupils are equal, round, and reactive to light. Right eye exhibits no discharge. Left eye exhibits no discharge.  Beneath left eye and on upper lid, quite erythematous, mildly edematous, tender to touch  Skin: Skin is warm and dry.  On the right & left buttocks below sacrum is a stage  1-2 Pressure ulcer. Right side Erythematous base measuring  2 inches (length) x 1.5 inches (width). No open sores, with blanching on right buttocks. Left buttocks has an erythematous base measuring 3 inches (length) x 2 inches (width) with open abrasion at the top that is scabbed over and a smaller abrasion at the bottom that is bleeding.      Labs reviewed: Basic Metabolic Panel:  Recent Labs  16/10/96 1548  10/08/12 0505 10/10/12 0550 10/18/12 1103 11/01/12 0858  NA  --   < > 142 143 150* 142  K  --   < > 4.6 3.8 6.4* 5.0  CL  --   < > 104 108 108 99  CO2  --   < > 32 24 19 21   GLUCOSE  --   < > 104* 125* 136* 104*  BUN  --   < > 31* 17 20 19   CREATININE  --   < > 1.71* 1.17* 1.53* 1.19*  CALCIUM  --   < > 11.2* 10.6* 11.0* 11.9*  TSH 1.781  --  0.619  --   --  1.400  < > = values in this interval not displayed. Liver Function Tests:  Recent Labs  08/15/12 0922 08/18/12 0436 10/08/12 0505 11/01/12 0858  AST 154* 49* 18 22  ALT 175* 128* 10 15  ALKPHOS 46 52 70 78  BILITOT 0.5 0.2* 0.2* 0.2  PROT 5.5* 5.7* 6.1 6.8  ALBUMIN 2.8* 2.8* 3.1*  --  CBC:  Recent Labs  09/13/12 1507 10/07/12 1834 10/08/12 0505 10/18/12 1103 11/01/12 0858  WBC 8.9 6.9 6.1 6.5 6.6  NEUTROABS 5.7 3.5  --  3.3 3.9  HGB 11.2* 10.5* 9.5* 11.0* 10.9*  HCT 34.4* 33.1* 30.0* 34.9 33.9*  MCV 88.3 88.3 88.5 88 88  PLT 324.0 275 254  --   --    Lipid Panel:  Lab Results  Component Value Date   HGBA1C 7.0* 06/09/2012   Assessment/Plan 1. Decubitus ulcer of buttock, stage 2, unspecified laterality -bilateral ischial areas involved -primarily superficial excoriation at this time -has lost top layer of skin from use of tape to secure dressings on buttocks -will use barrier cream and allevyn dressing now to prevent this irritation  2. Contact dermatitis due to adhesives -due to adhesive from tape -switch to barrier cream with allevyn dressing  3. Obstruction of left lacrimal  duct -per ophtho (according to her son)--advised that erythema beneath left eye may be due to this or recent stye that was treated with ointment -recommended warm compresses on left eye and f/u with ophtho  4. Macular degeneration of left eye -follows with ophthalmology -no changes in vision  Labs/tests ordered:  F/u with ophtho Next appt:  As scheduled with Dr. Chilton Si

## 2012-12-27 NOTE — Patient Instructions (Addendum)
Get Allevyn Sacrum dressing for medical supply store and place on buttocks as needed.   Aloe Vesta Barrier Cream

## 2013-01-05 ENCOUNTER — Other Ambulatory Visit: Payer: Self-pay | Admitting: Internal Medicine

## 2013-01-10 ENCOUNTER — Other Ambulatory Visit: Payer: Self-pay | Admitting: *Deleted

## 2013-01-10 MED ORDER — FUROSEMIDE 20 MG PO TABS: ORAL_TABLET | ORAL | Status: DC

## 2013-02-03 ENCOUNTER — Other Ambulatory Visit: Payer: Self-pay | Admitting: Internal Medicine

## 2013-02-03 DIAGNOSIS — E039 Hypothyroidism, unspecified: Secondary | ICD-10-CM

## 2013-02-03 DIAGNOSIS — I10 Essential (primary) hypertension: Secondary | ICD-10-CM

## 2013-02-03 DIAGNOSIS — E785 Hyperlipidemia, unspecified: Secondary | ICD-10-CM

## 2013-02-03 DIAGNOSIS — E1129 Type 2 diabetes mellitus with other diabetic kidney complication: Secondary | ICD-10-CM

## 2013-02-04 ENCOUNTER — Other Ambulatory Visit: Payer: Medicare Other

## 2013-02-04 DIAGNOSIS — E785 Hyperlipidemia, unspecified: Secondary | ICD-10-CM

## 2013-02-04 DIAGNOSIS — E1129 Type 2 diabetes mellitus with other diabetic kidney complication: Secondary | ICD-10-CM

## 2013-02-04 DIAGNOSIS — I10 Essential (primary) hypertension: Secondary | ICD-10-CM

## 2013-02-04 DIAGNOSIS — E039 Hypothyroidism, unspecified: Secondary | ICD-10-CM

## 2013-02-05 LAB — CBC WITH DIFFERENTIAL/PLATELET
Basophils Absolute: 0 10*3/uL (ref 0.0–0.2)
Basos: 1 %
Eosinophils Absolute: 0.3 10*3/uL (ref 0.0–0.4)
Immature Grans (Abs): 0 10*3/uL (ref 0.0–0.1)
Lymphs: 32 %
MCH: 31.1 pg (ref 26.6–33.0)
MCHC: 34.1 g/dL (ref 31.5–35.7)
MCV: 91 fL (ref 79–97)
Monocytes Absolute: 0.5 10*3/uL (ref 0.1–0.9)
Neutrophils Relative %: 55 %
RBC: 3.83 x10E6/uL (ref 3.77–5.28)

## 2013-02-05 LAB — COMPREHENSIVE METABOLIC PANEL
ALT: 12 IU/L (ref 0–32)
AST: 14 IU/L (ref 0–40)
Albumin/Globulin Ratio: 1.8 (ref 1.1–2.5)
Alkaline Phosphatase: 76 IU/L (ref 39–117)
BUN/Creatinine Ratio: 31 — ABNORMAL HIGH (ref 11–26)
Calcium: 10.9 mg/dL — ABNORMAL HIGH (ref 8.6–10.2)
Creatinine, Ser: 1.23 mg/dL — ABNORMAL HIGH (ref 0.57–1.00)
GFR calc Af Amer: 43 mL/min/{1.73_m2} — ABNORMAL LOW (ref >59)
GFR calc non Af Amer: 38 mL/min/{1.73_m2} — ABNORMAL LOW (ref >59)
Globulin, Total: 2.3 g/dL (ref 1.5–4.5)
Potassium: 4.5 mmol/L (ref 3.5–5.2)
Sodium: 142 mmol/L (ref 134–144)
Total Bilirubin: <0.2 mg/dL (ref 0.0–1.2)

## 2013-02-05 LAB — LIPID PANEL
Chol/HDL Ratio: 3.9 ratio units (ref 0.0–4.4)
Cholesterol, Total: 161 mg/dL (ref 100–199)
HDL: 41 mg/dL (ref >39)
Triglycerides: 223 mg/dL — ABNORMAL HIGH (ref 0–149)

## 2013-02-05 LAB — HEMOGLOBIN A1C: Hgb A1c MFr Bld: 6.2 % — ABNORMAL HIGH (ref 4.8–5.6)

## 2013-02-07 ENCOUNTER — Encounter: Payer: Self-pay | Admitting: *Deleted

## 2013-02-08 ENCOUNTER — Ambulatory Visit (INDEPENDENT_AMBULATORY_CARE_PROVIDER_SITE_OTHER): Payer: Medicare Other | Admitting: Internal Medicine

## 2013-02-08 ENCOUNTER — Encounter: Payer: Self-pay | Admitting: Internal Medicine

## 2013-02-08 VITALS — BP 136/70 | HR 65 | Temp 97.3°F | Wt 156.2 lb

## 2013-02-08 DIAGNOSIS — F0390 Unspecified dementia without behavioral disturbance: Secondary | ICD-10-CM

## 2013-02-08 DIAGNOSIS — E875 Hyperkalemia: Secondary | ICD-10-CM

## 2013-02-08 DIAGNOSIS — R531 Weakness: Secondary | ICD-10-CM

## 2013-02-08 DIAGNOSIS — D649 Anemia, unspecified: Secondary | ICD-10-CM

## 2013-02-08 DIAGNOSIS — N183 Chronic kidney disease, stage 3 unspecified: Secondary | ICD-10-CM

## 2013-02-08 DIAGNOSIS — E785 Hyperlipidemia, unspecified: Secondary | ICD-10-CM

## 2013-02-08 DIAGNOSIS — E039 Hypothyroidism, unspecified: Secondary | ICD-10-CM

## 2013-02-08 DIAGNOSIS — I4891 Unspecified atrial fibrillation: Secondary | ICD-10-CM

## 2013-02-08 DIAGNOSIS — I10 Essential (primary) hypertension: Secondary | ICD-10-CM

## 2013-02-08 DIAGNOSIS — R5381 Other malaise: Secondary | ICD-10-CM

## 2013-02-08 DIAGNOSIS — E1129 Type 2 diabetes mellitus with other diabetic kidney complication: Secondary | ICD-10-CM

## 2013-02-08 MED ORDER — OXYCODONE HCL 5 MG PO TABS: ORAL_TABLET | ORAL | Status: DC

## 2013-02-08 NOTE — Progress Notes (Signed)
Patient ID: [name], female   DOB: 1916/08/17, 79 y.o.   MRN: [id number]    Location:    [facility]  [facility] of Service:  [facility]    Allergies  Allergen Reactions  . Noroxin [Norfloxacin] Swelling  . Sulfa Antibiotics Nausea And Vomiting  . Prozac [Fluoxetine Hcl] Rash    Chief Complaint  Patient presents with  . Medical Managment of Chronic Issues    3 month f/u  . other    need to schedule colonoscopy, also would like to get RX for Oxycodone HCL 5 mg , taken every 6 hours for pain PRN  . other    sore on bottom better and just purplish in color on the skin    HPI:  HTN (hypertension): controlled  Anemia: improved  Atrial fibrillation: rate controlled  CKD (chronic kidney disease), stage III: BUN and creat are a little higher  Dementia: unchanged  Hyperlipidemia: controlled  Hyperpotassemia: resolved  Hypothyroid: controlled  Type II or unspecified type diabetes mellitus with renal manifestations, not stated as uncontrolled(250.40): controlled  Weakness: stable  Other disorder of calcium metabolism: hypercalcemia is better.    Medications: Patient's Medications  New Prescriptions   No medications on file  Previous Medications   ASPIRIN EC 81 MG TABLET    Take 1 tablet (81 mg total) by mouth daily.   ATORVASTATIN (LIPITOR) 20 MG TABLET    Take 1 tablet (20 mg total) by mouth daily.   CALCITONIN, SALMON, (MIACALCIN/FORTICAL) 200 UNIT/ACT NASAL SPRAY    INSTILL 1 SPRAY INTO ALTERNATING NOSTRILS ONCE A DAY FOR OSTEOPROSIS   COMBIGAN 0.2-0.5 % OPHTHALMIC SOLUTION    One drop left eye twice daily   CYCLOSPORINE (RESTASIS) 0.05 % OPHTHALMIC EMULSION    Place 1 drop into both eyes 2 (two) times daily.   DEXTROMETHORPHAN-GUAIFENESIN (MUCINEX DM) 30-600 MG PER 12 HR TABLET    Take 1 tablet by mouth every 12 (twelve) hours.   DICLOFENAC SODIUM (VOLTAREN) 1 % GEL    Apply topically. Apply twice a day to legs   DOCUSATE SODIUM 100 MG CAPS    Take 100 mg by mouth 2 (two)  times daily.   FERROUS SULFATE 325 (65 FE) MG TABLET    Take 1 tablet (325 mg total) by mouth 2 (two) times daily with a meal.   FUROSEMIDE (LASIX) 20 MG TABLET    Take one tablet by mouth once daily as needed to prevent fluid retention   GABAPENTIN (NEURONTIN) 300 MG CAPSULE    TAKE 2 CAPSULES BY MOUTH AT BEDTIME   GLUCOSE BLOOD (BAYER CONTOUR TEST) TEST STRIP    Test blood sugar twice daily as directed. Dx: 250.00   LANSOPRAZOLE (PREVACID) 30 MG CAPSULE    Take 1 capsule (30 mg total) by mouth daily.   LATANOPROST (XALATAN) 0.005 % OPHTHALMIC SOLUTION       LEVOTHYROXINE (SYNTHROID, LEVOTHROID) 75 MCG TABLET    TAKE 1 TABLET BY MOUTH ONCE DAILY FOR THYROID SUPPLEMENT   METFORMIN (GLUCOPHAGE) 1000 MG TABLET    TAKE 1 TABLET BY MOUTH 2 TIMES DAILY TO CONTROL DIABETES   MICROLET LANCETS MISC       MULTIPLE VITAMINS-MINERALS (CENTRUM SILVER PO)    Take 1 tablet by mouth daily.   NON FORMULARY    Take 1 capsule twice daily to help with Vision   OLMESARTAN (BENICAR) 20 MG TABLET    Take 10 mg by mouth daily. Takes 1/2 tablet   POLYETHYLENE GLYCOL (MIRALAX / GLYCOLAX)  PACKET    Take 17 g by mouth daily.   PREGABALIN (LYRICA) 75 MG CAPSULE    Take 75 mg by mouth 2 (two) times daily.   SACCHAROMYCES BOULARDII (FLORASTOR) 250 MG CAPSULE    Take 1 capsule (250 mg total) by mouth 2 (two) times daily.   TOBRAMYCIN-DEXAMETHASONE (TOBRADEX) OPHTHALMIC SOLUTION       TRAMADOL (ULTRAM) 50 MG TABLET    take 1 tablet by mouth every 12 hours if needed for pain   TRAVOPROST, BENZALKONIUM, (TRAVATAN) 0.004 % OPHTHALMIC SOLUTION    Place 1 drop into the left eye at bedtime.   TROSPIUM CHLORIDE (SANCTURA XR) 60 MG CP24    Take one tablet by mouth daily to help bladder control   VITAMIN C (ASCORBIC ACID) 250 MG TABLET    Take 250 mg by mouth daily.   ZOLPIDEM (AMBIEN) 5 MG TABLET    Take 5 mg by mouth at bedtime as needed for sleep.  Modified Medications   No medications on file  Discontinued Medications    HYDROCODONE-ACETAMINOPHEN (NORCO/VICODIN) 5-325 MG PER TABLET    Take 1 tablet by mouth every 4 (four) hours as needed.     Review of Systems  Constitutional: Positive for activity change and fatigue. Negative for fever, chills and unexpected weight change.  HENT: Positive for hearing loss. Negative for ear pain.   Eyes: Negative.        Wears corrective lenses  Respiratory: Positive for cough.        Dry cough for the last year.  Cardiovascular: Positive for leg swelling.  Gastrointestinal: Negative.   Endocrine:       History of diabetes  Genitourinary:       Incontinence  Musculoskeletal: Positive for arthralgias, back pain, gait problem and myalgias.  Skin:       Bedsores since in hospital  Neurological: Positive for weakness and numbness. Negative for dizziness, tremors, seizures, syncope, speech difficulty and light-headedness.       Numb in the feet and the lower legs  Hematological: Negative.   Psychiatric/Behavioral:       Memory loss and associated mild confusion.    Filed Vitals:   02/08/13 1459  BP: 136/70  Pulse: 65  Temp: 97.3 F (36.3 C)  TempSrc: Oral  Weight: 156 lb 3.2 oz (70.852 kg)  SpO2: 95%   Physical Exam  Constitutional: She is oriented to person, place, and time. No distress.  Overweight.  HENT:  Severe hearing loss in the right ear  Eyes: Conjunctivae and EOM are normal. Pupils are equal, round, and reactive to light.  Neck: Normal range of motion. Neck supple. No JVD present. No tracheal deviation present. No thyromegaly present.  Cardiovascular:  Rate controlled irregularly irregular rhythm. No murmur. No gallop.  Pulmonary/Chest: Effort normal. No respiratory distress. She has no wheezes. She has rales. She exhibits no tenderness.  Abdominal: She exhibits no distension and no mass. There is no tenderness.  Musculoskeletal: She exhibits edema. She exhibits no tenderness.  Bilateral knee tenderness and crepitance. Mild back discomfort.    Lymphadenopathy:    She has no cervical adenopathy.  Neurological: She is alert and oriented to person, place, and time. No cranial nerve deficit. Coordination normal.  Mild memory loss.  Skin: No rash noted. She is not diaphoretic. No erythema. No pallor.  Abraded area left ankle. Healed decubitus of buttocks near intergluteal fold  Psychiatric: She has a normal mood and affect. Her behavior is normal. Thought content normal.  Labs reviewed: Abstract on 02/07/2013  Component Date Value Range Status  . HM Mammogram 07/03/2008 Negative   Final  . HM Colonoscopy 07/11/2003 Diverticulosis   Final  . HM Dexa Scan 06/19/2004 Osteoporosis   Final  Appointment on 02/04/2013  Component Date Value Range Status  . Hemoglobin A1C 02/04/2013 6.2* 4.8 - 5.6 % Final   Comment:          Increased risk for diabetes: 5.7 - 6.4                                   Diabetes: >6.4                                   Glycemic control for adults with diabetes: <7.0  . Estimated average glucose 02/04/2013 131   Final  . Cholesterol, Total 02/04/2013 161  100 - 199 mg/dL Final  . Triglycerides 02/04/2013 223* 0 - 149 mg/dL Final  . HDL 16/11/9602 41  >39 mg/dL Final   Comment: According to ATP-III Guidelines, HDL-C >59 mg/dL is considered a                          negative risk factor for CHD.  Marland Kitchen VLDL Cholesterol Cal 02/04/2013 45* 5 - 40 mg/dL Final  . LDL Calculated 02/04/2013 75  0 - 99 mg/dL Final  . Chol/HDL Ratio 02/04/2013 3.9  0.0 - 4.4 ratio units Final   Comment:                                   T. Chol/HDL Ratio                                                                      Men  Women                                                        1/2 Avg.Risk  3.4    3.3                                                            Avg.Risk  5.0    4.4                                                         2X Avg.Risk  9.6    7.1  3X  Avg.Risk 23.4   11.0  . Glucose 02/04/2013 118* 65 - 99 mg/dL Final  . BUN 40/98/1191 38* 10 - 36 mg/dL Final  . Creatinine, Ser 02/04/2013 1.23* 0.57 - 1.00 mg/dL Final  . GFR calc non Af Amer 02/04/2013 38* >59 mL/min/1.73 Final  . GFR calc Af Amer 02/04/2013 43* >59 mL/min/1.73 Final  . BUN/Creatinine Ratio 02/04/2013 31* 11 - 26 Final  . Sodium 02/04/2013 142  134 - 144 mmol/L Final  . Potassium 02/04/2013 4.5  3.5 - 5.2 mmol/L Final  . Chloride 02/04/2013 101  97 - 108 mmol/L Final  . CO2 02/04/2013 20  18 - 29 mmol/L Final  . Calcium 02/04/2013 10.9* 8.6 - 10.2 mg/dL Final  . Total Protein 02/04/2013 6.5  6.0 - 8.5 g/dL Final  . Albumin 47/82/9562 4.2  3.2 - 4.6 g/dL Final  . Globulin, Total 02/04/2013 2.3  1.5 - 4.5 g/dL Final  . Albumin/Globulin Ratio 02/04/2013 1.8  1.1 - 2.5 Final  . Total Bilirubin 02/04/2013 <0.2  0.0 - 1.2 mg/dL Final  . Alkaline Phosphatase 02/04/2013 76  39 - 117 IU/L Final  . AST 02/04/2013 14  0 - 40 IU/L Final  . ALT 02/04/2013 12  0 - 32 IU/L Final  . TSH 02/04/2013 2.200  0.450 - 4.500 uIU/mL Final  . WBC 02/04/2013 6.6  3.4 - 10.8 x10E3/uL Final  . RBC 02/04/2013 3.83  3.77 - 5.28 x10E6/uL Final  . Hemoglobin 02/04/2013 11.9  11.1 - 15.9 g/dL Final  . HCT 13/09/6576 34.9  34.0 - 46.6 % Final  . MCV 02/04/2013 91  79 - 97 fL Final  . MCH 02/04/2013 31.1  26.6 - 33.0 pg Final  . MCHC 02/04/2013 34.1  31.5 - 35.7 g/dL Final  . RDW 46/96/2952 15.9* 12.3 - 15.4 % Final  . Neutrophils Relative % 02/04/2013 55   Final  . Lymphs 02/04/2013 32   Final  . Monocytes 02/04/2013 8   Final  . Eos 02/04/2013 4   Final  . Basos 02/04/2013 1   Final  . Neutrophils Absolute 02/04/2013 3.6  1.4 - 7.0 x10E3/uL Final  . Lymphocytes Absolute 02/04/2013 2.1  0.7 - 3.1 x10E3/uL Final  . Monocytes Absolute 02/04/2013 0.5  0.1 - 0.9 x10E3/uL Final  . Eosinophils Absolute 02/04/2013 0.3  0.0 - 0.4 x10E3/uL Final  . Basophils Absolute 02/04/2013 0.0  0.0 - 0.2 x10E3/uL  Final  . Immature Granulocytes 02/04/2013 0   Final  . Immature Grans (Abs) 02/04/2013 0.0  0.0 - 0.1 x10E3/uL Final      Assessment/Plan HTN (hypertension): controlled  Anemia - Plan: CBC With differential/Platelet  Atrial fibrillation; rate controlled  CKD (chronic kidney disease), stage III - Plan: CMP  Dementia: stable  Hyperlipidemia - Plan: Lipid panel  Hyperpotassemia:resolved  Hypothyroid - Plan: TSH  Type II or unspecified type diabetes mellitus with renal manifestations, not stated as uncontrolled(250.40) - Plan: CMP, Hemoglobin A1c  Weakness: generalized. Related to aging.  Other disorder of calcium metabolism - Plan: CMP

## 2013-02-08 NOTE — Patient Instructions (Signed)
Continue current medications. 

## 2013-03-04 ENCOUNTER — Other Ambulatory Visit: Payer: Self-pay | Admitting: *Deleted

## 2013-03-04 MED ORDER — PREGABALIN 75 MG PO CAPS: ORAL_CAPSULE | ORAL | Status: DC

## 2013-04-04 ENCOUNTER — Other Ambulatory Visit: Payer: Self-pay | Admitting: *Deleted

## 2013-04-04 MED ORDER — GABAPENTIN 300 MG PO CAPS: ORAL_CAPSULE | ORAL | Status: DC

## 2013-04-13 ENCOUNTER — Encounter: Payer: Self-pay | Admitting: Nurse Practitioner

## 2013-04-13 ENCOUNTER — Ambulatory Visit (INDEPENDENT_AMBULATORY_CARE_PROVIDER_SITE_OTHER): Payer: Medicare Other | Admitting: Nurse Practitioner

## 2013-04-13 VITALS — BP 122/70 | HR 90 | Temp 97.7°F | Resp 16 | Wt 161.0 lb

## 2013-04-13 DIAGNOSIS — J069 Acute upper respiratory infection, unspecified: Secondary | ICD-10-CM

## 2013-04-13 MED ORDER — AMOXICILLIN-POT CLAVULANATE 875-125 MG PO TABS: 1.0000 | ORAL_TABLET | Freq: Two times a day (BID) | ORAL | Status: DC

## 2013-04-13 NOTE — Patient Instructions (Signed)
mucinex DM 1 tablet every 12 hours with a full of water Increase water intake PLAIN nasal spray as needed  Augmentin 875-125 mg twice daily for 1 week  Florastor 2 tablets twice daily for 2 week then decrease back to 1 twice daily  Sinusitis Sinusitis is redness, soreness, and swelling (inflammation) of the paranasal sinuses. Paranasal sinuses are air pockets within the bones of your face (beneath the eyes, the middle of the forehead, or above the eyes). In healthy paranasal sinuses, mucus is able to drain out, and air is able to circulate through them by way of your nose. However, when your paranasal sinuses are inflamed, mucus and air can become trapped. This can allow bacteria and other germs to grow and cause infection. Sinusitis can develop quickly and last only a short time (acute) or continue over a long period (chronic). Sinusitis that lasts for more than 12 weeks is considered chronic.  CAUSES  Causes of sinusitis include:  Allergies.  Structural abnormalities, such as displacement of the cartilage that separates your nostrils (deviated septum), which can decrease the air flow through your nose and sinuses and affect sinus drainage.  Functional abnormalities, such as when the small hairs (cilia) that line your sinuses and help remove mucus do not work properly or are not present. SYMPTOMS  Symptoms of acute and chronic sinusitis are the same. The primary symptoms are pain and pressure around the affected sinuses. Other symptoms include:  Upper toothache.  Earache.  Headache.  Bad breath.  Decreased sense of smell and taste.  A cough, which worsens when you are lying flat.  Fatigue.  Fever.  Thick drainage from your nose, which often is green and may contain pus (purulent).  Swelling and warmth over the affected sinuses. DIAGNOSIS  Your caregiver will perform a physical exam. During the exam, your caregiver may:  Look in your nose for signs of abnormal growths in  your nostrils (nasal polyps).  Tap over the affected sinus to check for signs of infection.  View the inside of your sinuses (endoscopy) with a special imaging device with a light attached (endoscope), which is inserted into your sinuses. If your caregiver suspects that you have chronic sinusitis, one or more of the following tests may be recommended:  Allergy tests.  Nasal culture A sample of mucus is taken from your nose and sent to a lab and screened for bacteria.  Nasal cytology A sample of mucus is taken from your nose and examined by your caregiver to determine if your sinusitis is related to an allergy. TREATMENT  Most cases of acute sinusitis are related to a viral infection and will resolve on their own within 10 days. Sometimes medicines are prescribed to help relieve symptoms (pain medicine, decongestants, nasal steroid sprays, or saline sprays).  However, for sinusitis related to a bacterial infection, your caregiver will prescribe antibiotic medicines. These are medicines that will help kill the bacteria causing the infection.  Rarely, sinusitis is caused by a fungal infection. In theses cases, your caregiver will prescribe antifungal medicine. For some cases of chronic sinusitis, surgery is needed. Generally, these are cases in which sinusitis recurs more than 3 times per year, despite other treatments. HOME CARE INSTRUCTIONS   Drink plenty of water. Water helps thin the mucus so your sinuses can drain more easily.  Use a humidifier.  Inhale steam 3 to 4 times a day (for example, sit in the bathroom with the shower running).  Apply a warm, moist washcloth to  your face 3 to 4 times a day, or as directed by your caregiver.  Use saline nasal sprays to help moisten and clean your sinuses.  Take over-the-counter or prescription medicines for pain, discomfort, or fever only as directed by your caregiver. SEEK IMMEDIATE MEDICAL CARE IF:  You have increasing pain or severe  headaches.  You have nausea, vomiting, or drowsiness.  You have swelling around your face.  You have vision problems.  You have a stiff neck.  You have difficulty breathing. MAKE SURE YOU:   Understand these instructions.  Will watch your condition.  Will get help right away if you are not doing well or get worse. Document Released: 02/10/2005 Document Revised: 05/05/2011 Document Reviewed: 02/25/2011 [address] [facility] Patient Information 2014 [place], [place].

## 2013-04-13 NOTE — Progress Notes (Signed)
Patient ID: [name], female   DOB: December 23, 1916, 80 y.o.   MRN: [id number]    Allergies  Allergen Reactions  . Noroxin [Norfloxacin] Swelling  . Sulfa Antibiotics Nausea And Vomiting  . Prozac [Fluoxetine Hcl] Rash    Chief Complaint  Patient presents with  . Acute Visit    chest congestion, cough with no phlegm x 1 week.  has taken Robitussin with little relief.    HPI: Patient is a 80 y.o. female seen in the office today for cough and congestion, pt in today with her son who is her primary care taker- pts son at visit who reports he has had similar symptoms with sinusitis and was on antibiotic now for the past week;  pt has had cough and chest congestion with wheezing at times. No shortness of breath, no fevers or chills but feels weak and tired and wants to sleep all the time. Cough is nonproductive, congestion is mostly in chest. No runny nose, no sore throat, no ear pain. Coughing a lot; robitussin helping her.  Able to eat and drink appropriately     Review of Systems:  Review of Systems  Constitutional: Positive for malaise/fatigue. Negative for fever and chills.  HENT: Positive for congestion. Negative for ear pain, sore throat and tinnitus.   Respiratory: Positive for cough, sputum production and wheezing. Negative for shortness of breath.   Cardiovascular: Negative for chest pain, palpitations and leg swelling.  Gastrointestinal: Negative for abdominal pain, diarrhea and constipation.  Skin: Negative for itching and rash.  Neurological: Positive for headaches. Negative for weakness.     Past Medical History  Diagnosis Date  . Diabetes type 2, uncontrolled   . Bronchitis, acute   . Anemia   . Gait disorder   . Hypertension   . Other disorder of calcium metabolism   . Myoclonus   . Hypothyroid   . Mild memory disturbance   . Hyperlipidemia   . Scoliosis   . Osteoporosis   . Eczema   . Hemoptysis   . Hyperpotassemia   . Unspecified hypertrophic and atrophic  condition of skin   . Seborrheic keratosis   . Urinary tract infection, site not specified   . Hematuria, unspecified   . Open wound of knee, leg (except thigh), and ankle, without mention of complication   . Personal history of fall   . Myoclonus   . Chronic kidney disease, unspecified   . Varicose veins of lower extremities with inflammation   . Trigger finger (acquired)   . Diastasis of muscle   . Type II or unspecified type diabetes mellitus without mention of complication, not stated as uncontrolled   . Chest pain, unspecified   . Closed fracture of lumbar vertebra without mention of spinal cord injury   . Personality change due to conditions classified elsewhere   . Closed dislocation, thoracic vertebra   . Carpal tunnel syndrome   . Other specified idiopathic peripheral neuropathy   . Unspecified constipation   . Unspecified glaucoma   . Macular degeneration (senile) of retina, unspecified   . Unspecified urinary incontinence   . Unspecified pruritic disorder   . Unspecified closed fracture of pelvis   . Unspecified vitamin D deficiency   . Other atopic dermatitis and related conditions   . Other disorder of calcium metabolism   . Diabetes with renal manifestations(250.4)   . Arthralgia of temporomandibular joint   . Insomnia, unspecified   . Obesity, unspecified   . Allergic rhinitis, cause  unspecified   . Atrial fibrillation   . Unspecified hereditary and idiopathic peripheral neuropathy   . Arthropathy, unspecified, site unspecified   . History of nuclear stress test 06/06/2010    lexiscan; normal pattern of perfusion; low risk    Past Surgical History  Procedure Laterality Date  . Abdominal hysterectomy      1973  . Coronary artery bypass graft      Dr Tyrone Sage 04/1996  . Biopsy breast      2000  . Knee arthroscopy      left Dr Thurston Hole   . Lumbar spine surgery      Dr Newell Coral   . Laser laparoscopy      right eye 04/2005  . Right oophorectomy      1950    . Esophagogastroduodenoscopy N/A 08/17/2012    Procedure: ESOPHAGOGASTRODUODENOSCOPY (EGD);  Surgeon: Hilarie Fredrickson, MD;  Location: [facility];  Service: Endoscopy;  Laterality: N/A;  . Hot hemostasis N/A 08/17/2012    Procedure: HOT HEMOSTASIS (ARGON PLASMA COAGULATION/BICAP);  Surgeon: Hilarie Fredrickson, MD;  Location: [facility];  Service: Endoscopy;  Laterality: N/A;   Social History:   reports that she has never smoked. She has never used smokeless tobacco. She reports that she drinks alcohol. She reports that she does not use illicit drugs.  Family History  Problem Relation Age of Onset  . Diabetes Mother   . Cancer Sister     colon  . Alzheimer's disease Sister     Medications: Patient's Medications  New Prescriptions   No medications on file  Previous Medications   ASPIRIN EC 81 MG TABLET    Take 1 tablet (81 mg total) by mouth daily.   ATORVASTATIN (LIPITOR) 20 MG TABLET    Take 1 tablet (20 mg total) by mouth daily.   CALCITONIN, SALMON, (MIACALCIN/FORTICAL) 200 UNIT/ACT NASAL SPRAY    INSTILL 1 SPRAY INTO ALTERNATING NOSTRILS ONCE A DAY FOR OSTEOPROSIS   COMBIGAN 0.2-0.5 % OPHTHALMIC SOLUTION    One drop left eye twice daily   CYCLOSPORINE (RESTASIS) 0.05 % OPHTHALMIC EMULSION    Place 1 drop into both eyes 2 (two) times daily.   DEXTROMETHORPHAN-GUAIFENESIN (MUCINEX DM) 30-600 MG PER 12 HR TABLET    Take 1 tablet by mouth every 12 (twelve) hours.   DICLOFENAC SODIUM (VOLTAREN) 1 % GEL    Apply topically. Apply twice a day to legs   DOCUSATE SODIUM 100 MG CAPS    Take 100 mg by mouth 2 (two) times daily.   FERROUS SULFATE 325 (65 FE) MG TABLET    Take 1 tablet (325 mg total) by mouth 2 (two) times daily with a meal.   FUROSEMIDE (LASIX) 20 MG TABLET    Take one tablet by mouth once daily as needed to prevent fluid retention   GABAPENTIN (NEURONTIN) 300 MG CAPSULE    Take two capsules by mouth at bedtime   GLUCOSE BLOOD (BAYER CONTOUR TEST) TEST STRIP    Test blood sugar twice  daily as directed. Dx: 250.00   LANSOPRAZOLE (PREVACID) 30 MG CAPSULE    Take 1 capsule (30 mg total) by mouth daily.   LATANOPROST (XALATAN) 0.005 % OPHTHALMIC SOLUTION       LEVOTHYROXINE (SYNTHROID, LEVOTHROID) 75 MCG TABLET    TAKE 1 TABLET BY MOUTH ONCE DAILY FOR THYROID SUPPLEMENT   METFORMIN (GLUCOPHAGE) 1000 MG TABLET    TAKE 1 TABLET BY MOUTH 2 TIMES DAILY TO CONTROL DIABETES   MICROLET LANCETS MISC  MULTIPLE VITAMINS-MINERALS (CENTRUM SILVER PO)    Take 1 tablet by mouth daily.   NON FORMULARY    Take 1 capsule twice daily to help with Vision   OLMESARTAN (BENICAR) 20 MG TABLET    Take 10 mg by mouth daily. Takes 1/2 tablet   OXYCODONE (ROXICODONE) 5 MG IMMEDIATE RELEASE TABLET    One every 6 hours if needed for severe pain   POLYETHYLENE GLYCOL (MIRALAX / GLYCOLAX) PACKET    Take 17 g by mouth daily.   PREGABALIN (LYRICA) 75 MG CAPSULE    Take one capsule by mouth twice daily to help with pains and nerves   SACCHAROMYCES BOULARDII (FLORASTOR) 250 MG CAPSULE    Take 1 capsule (250 mg total) by mouth 2 (two) times daily.   TOBRAMYCIN-DEXAMETHASONE (TOBRADEX) OPHTHALMIC SOLUTION       TRAMADOL (ULTRAM) 50 MG TABLET    take 1 tablet by mouth every 12 hours if needed for pain   TRAVOPROST, BENZALKONIUM, (TRAVATAN) 0.004 % OPHTHALMIC SOLUTION    Place 1 drop into the left eye at bedtime.   TROSPIUM CHLORIDE (SANCTURA XR) 60 MG CP24    Take one tablet by mouth daily to help bladder control   VITAMIN C (ASCORBIC ACID) 250 MG TABLET    Take 250 mg by mouth daily.   ZOLPIDEM (AMBIEN) 5 MG TABLET    Take 5 mg by mouth at bedtime as needed for sleep.  Modified Medications   No medications on file  Discontinued Medications   No medications on file     Physical Exam:  Filed Vitals:   04/13/13 1310  BP: 122/70  Pulse: 90  Temp: 97.7 F (36.5 C)  TempSrc: Oral  Resp: 16  Weight: 161 lb (73.029 kg)  SpO2: 95%    Physical Exam  Constitutional: She is oriented to person, place,  and time and well-developed, well-nourished, and in no distress.  HENT:  Head: Normocephalic and atraumatic.  Right Ear: External ear normal.  Left Ear: External ear normal.  Nose: Nose normal.  Mouth/Throat: Oropharynx is clear and moist. No oropharyngeal exudate.  Eyes: Conjunctivae and EOM are normal. Pupils are equal, round, and reactive to light.  Neck: Normal range of motion. Neck supple. No thyromegaly present.  Cardiovascular: Normal rate, regular rhythm and normal heart sounds.   Pulmonary/Chest: Effort normal and breath sounds normal. No respiratory distress. She has no wheezes. She has no rales.  Abdominal: Soft. Bowel sounds are normal. She exhibits no distension.  Lymphadenopathy:    She has no cervical adenopathy.  Neurological: She is alert and oriented to person, place, and time.  Skin: Skin is warm and dry. She is not diaphoretic.  Psychiatric: Affect normal.     Labs reviewed: Basic Metabolic Panel:  Recent Labs  40/98/1108/15/14 0505  10/18/12 1103 11/01/12 0858 02/04/13 0920  NA 142  < > 150* 142 142  K 4.6  < > 6.4* 5.0 4.5  CL 104  < > 108 99 101  CO2 32  < > 19 21 20   GLUCOSE 104*  < > 136* 104* 118*  BUN 31*  < > 20 19 38*  CREATININE 1.71*  < > 1.53* 1.19* 1.23*  CALCIUM 11.2*  < > 11.0* 11.9* 10.9*  TSH 0.619  --   --  1.400 2.200  < > = values in this interval not displayed. Liver Function Tests:  Recent Labs  08/15/12 0922 08/18/12 0436 10/08/12 0505 11/01/12 0858 02/04/13 0920  AST 154*  49* 18 22 14   ALT 175* 128* 10 15 12   ALKPHOS 46 52 70 78 76  BILITOT 0.5 0.2* 0.2* 0.2 <0.2  PROT 5.5* 5.7* 6.1 6.8 6.5  ALBUMIN 2.8* 2.8* 3.1*  --   --    No results found for this basename: LIPASE, AMYLASE,  in the last 8760 hours No results found for this basename: AMMONIA,  in the last 8760 hours CBC:  Recent Labs  09/13/12 1507 10/07/12 1834 10/08/12 0505 10/18/12 1103 11/01/12 0858 02/04/13 0920  WBC 8.9 6.9 6.1 6.5 6.6 6.6  NEUTROABS 5.7  3.5  --  3.3 3.9 3.6  HGB 11.2* 10.5* 9.5* 11.0* 10.9* 11.9  HCT 34.4* 33.1* 30.0* 34.9 33.9* 34.9  MCV 88.3 88.3 88.5 88 88 91  PLT 324.0 275 254  --   --   --    Lipid Panel:  Recent Labs  02/04/13 0920  HDL 41  LDLCALC 75  TRIG 223*  CHOLHDL 3.9   TSH:  Recent Labs  10/08/12 0505 11/01/12 0858 02/04/13 0920  TSH 0.619 1.400 2.200   A1C: No components found with this basename: A1C,    Assessment/Plan 1. Upper respiratory infection with cough and congestion -mucinex DM 1 tablet twice daily with full glass of water  -plain saline nasal spray as needed for nasal congestion - amoxicillin-clavulanate (AUGMENTIN) 875-125 MG per tablet; Take 1 tablet by mouth 2 (two) times daily for 1 week Dispense: 14 tablet; Refill: 0 - increase florastor to 2 tablets twice daily for 1 week  -given return precautions and when to seek medical attention, pt and son understand

## 2013-05-02 ENCOUNTER — Telehealth: Payer: Self-pay | Admitting: *Deleted

## 2013-05-02 ENCOUNTER — Other Ambulatory Visit: Payer: Medicare Other

## 2013-05-02 ENCOUNTER — Other Ambulatory Visit: Payer: Self-pay | Admitting: *Deleted

## 2013-05-02 DIAGNOSIS — R300 Dysuria: Secondary | ICD-10-CM

## 2013-05-02 NOTE — Telephone Encounter (Signed)
Patient son called, Onalee HuaDavid, and stated that patient was burning with urination. No appointment available for today. Spoke with Aram Beechamynthia and Dr. Renato Gailseed and patient son can bring patient and have her leave a urine specimen for us to send for U/A and CX. Patient son agreed and will bring her in today.

## 2013-05-03 LAB — URINALYSIS
Bilirubin, UA: NEGATIVE
Glucose, UA: NEGATIVE
Ketones, UA: NEGATIVE
Nitrite, UA: NEGATIVE
Specific Gravity, UA: 1.01 (ref 1.005–1.030)
Urobilinogen, Ur: 0.2 mg/dL (ref 0.0–1.9)
pH, UA: 6 (ref 5.0–7.5)

## 2013-05-04 ENCOUNTER — Telehealth: Payer: Self-pay

## 2013-05-04 LAB — URINE CULTURE

## 2013-05-04 MED ORDER — CEFUROXIME AXETIL 500 MG PO TABS: 500.0000 mg | ORAL_TABLET | Freq: Two times a day (BID) | ORAL | Status: DC

## 2013-05-04 MED ORDER — SACCHAROMYCES BOULARDII 250 MG PO CAPS: 250.0000 mg | ORAL_CAPSULE | Freq: Two times a day (BID) | ORAL | Status: DC

## 2013-05-04 NOTE — Telephone Encounter (Signed)
Message copied by Maurice SmallBEATTY, Breanda Greenlaw C on Wed May 04, 2013  4:41 PM ------      Message from: REED, [place]IFFANY L      Created: Wed May 04, 2013  3:42 PM       Does have UTI.  Has allergies to quinolones and sulfa.  Begin ceftin 500mg  po bid x 7 days for UTI.  Also I recommend florastor 250mg  po bid x 10 days to prevent antibiotic associated diarrhea. ------

## 2013-05-04 NOTE — Telephone Encounter (Signed)
Discussed with patient's son, rx's sent to [facility] pharmacy.

## 2013-05-09 ENCOUNTER — Other Ambulatory Visit: Payer: Medicare Other

## 2013-05-09 DIAGNOSIS — E039 Hypothyroidism, unspecified: Secondary | ICD-10-CM

## 2013-05-09 DIAGNOSIS — E1129 Type 2 diabetes mellitus with other diabetic kidney complication: Secondary | ICD-10-CM

## 2013-05-09 DIAGNOSIS — N183 Chronic kidney disease, stage 3 unspecified: Secondary | ICD-10-CM

## 2013-05-09 DIAGNOSIS — E785 Hyperlipidemia, unspecified: Secondary | ICD-10-CM

## 2013-05-09 DIAGNOSIS — D649 Anemia, unspecified: Secondary | ICD-10-CM

## 2013-05-10 LAB — COMPREHENSIVE METABOLIC PANEL
ALT: 8 IU/L (ref 0–32)
AST: 17 IU/L (ref 0–40)
Albumin/Globulin Ratio: 2 (ref 1.1–2.5)
Albumin: 4.1 g/dL (ref 3.2–4.6)
Alkaline Phosphatase: 85 IU/L (ref 39–117)
BUN/Creatinine Ratio: 27 — ABNORMAL HIGH (ref 11–26)
BUN: 34 mg/dL (ref 10–36)
CHLORIDE: 102 mmol/L (ref 97–108)
CO2: 19 mmol/L (ref 18–29)
Calcium: 10.4 mg/dL — ABNORMAL HIGH (ref 8.7–10.3)
Creatinine, Ser: 1.25 mg/dL — ABNORMAL HIGH (ref 0.57–1.00)
GFR calc Af Amer: 43 mL/min/{1.73_m2} — ABNORMAL LOW (ref >59)
GFR calc non Af Amer: 37 mL/min/{1.73_m2} — ABNORMAL LOW (ref >59)
GLUCOSE: 118 mg/dL — AB (ref 65–99)
Globulin, Total: 2.1 g/dL (ref 1.5–4.5)
Potassium: 5.2 mmol/L (ref 3.5–5.2)
Sodium: 142 mmol/L (ref 134–144)
TOTAL PROTEIN: 6.2 g/dL (ref 6.0–8.5)
Total Bilirubin: <0.2 mg/dL (ref 0.0–1.2)

## 2013-05-10 LAB — CBC WITH DIFFERENTIAL
BASOS: 1 %
Basophils Absolute: 0.1 10*3/uL (ref 0.0–0.2)
Eos: 7 %
Eosinophils Absolute: 0.5 10*3/uL — ABNORMAL HIGH (ref 0.0–0.4)
HCT: 36.7 % (ref 34.0–46.6)
HEMOGLOBIN: 12.2 g/dL (ref 11.1–15.9)
IMMATURE GRANS (ABS): 0.1 10*3/uL (ref 0.0–0.1)
Immature Granulocytes: 1 %
Lymphocytes Absolute: 2.7 10*3/uL (ref 0.7–3.1)
Lymphs: 37 %
MCH: 31 pg (ref 26.6–33.0)
MCHC: 33.2 g/dL (ref 31.5–35.7)
MCV: 93 fL (ref 79–97)
MONOS ABS: 0.7 10*3/uL (ref 0.1–0.9)
Monocytes: 10 %
NEUTROS PCT: 44 %
Neutrophils Absolute: 3.2 10*3/uL (ref 1.4–7.0)
PLATELETS: 226 10*3/uL (ref 150–379)
RBC: 3.94 x10E6/uL (ref 3.77–5.28)
RDW: 14.2 % (ref 12.3–15.4)
WBC: 7.2 10*3/uL (ref 3.4–10.8)

## 2013-05-10 LAB — LIPID PANEL
CHOLESTEROL TOTAL: 147 mg/dL (ref 100–199)
Chol/HDL Ratio: 3.5 ratio units (ref 0.0–4.4)
HDL: 42 mg/dL (ref >39)
LDL CALC: 77 mg/dL (ref 0–99)
TRIGLYCERIDES: 141 mg/dL (ref 0–149)
VLDL CHOLESTEROL CAL: 28 mg/dL (ref 5–40)

## 2013-05-10 LAB — HEMOGLOBIN A1C
Est. average glucose Bld gHb Est-mCnc: 143 mg/dL
Hgb A1c MFr Bld: 6.6 % — ABNORMAL HIGH (ref 4.8–5.6)

## 2013-05-10 LAB — TSH: TSH: 4.68 u[IU]/mL — ABNORMAL HIGH (ref 0.450–4.500)

## 2013-05-11 ENCOUNTER — Ambulatory Visit (INDEPENDENT_AMBULATORY_CARE_PROVIDER_SITE_OTHER): Payer: Medicare Other | Admitting: Internal Medicine

## 2013-05-11 ENCOUNTER — Encounter: Payer: Self-pay | Admitting: Internal Medicine

## 2013-05-11 VITALS — BP 138/80 | HR 100 | Wt 162.0 lb

## 2013-05-11 DIAGNOSIS — E039 Hypothyroidism, unspecified: Secondary | ICD-10-CM

## 2013-05-11 DIAGNOSIS — E785 Hyperlipidemia, unspecified: Secondary | ICD-10-CM

## 2013-05-11 DIAGNOSIS — M25579 Pain in unspecified ankle and joints of unspecified foot: Secondary | ICD-10-CM

## 2013-05-11 DIAGNOSIS — I10 Essential (primary) hypertension: Secondary | ICD-10-CM

## 2013-05-11 DIAGNOSIS — E1129 Type 2 diabetes mellitus with other diabetic kidney complication: Secondary | ICD-10-CM

## 2013-05-11 DIAGNOSIS — N390 Urinary tract infection, site not specified: Secondary | ICD-10-CM

## 2013-05-11 DIAGNOSIS — N183 Chronic kidney disease, stage 3 unspecified: Secondary | ICD-10-CM

## 2013-05-11 MED ORDER — CIPROFLOXACIN HCL 500 MG PO TABS: 500.0000 mg | ORAL_TABLET | Freq: Two times a day (BID) | ORAL | Status: DC

## 2013-05-11 NOTE — Patient Instructions (Signed)
Continue medications as listed 

## 2013-05-11 NOTE — Progress Notes (Signed)
Patient ID: [name], female   DOB: Feb 06, 1917, 80 y.o.   MRN: [id number]    Location:      Place of Service:      Allergies  Allergen Reactions  . Noroxin [Norfloxacin] Swelling  . Sulfa Antibiotics Nausea And Vomiting  . Prozac [Fluoxetine Hcl] Rash    Chief Complaint  Patient presents with  . Medical Managment of Chronic Issues    3 month follow-up on BS  . Urinary Tract Infection    Burning when urinating  . URI    Ongoing cough  . Ankle Pain    Ongoing ankle pain- pain has increased     HPI:  Had a cough. Took Augmentin which seemed to help, but then the cough returned. Has a wheeze and rattle in the chest.  UTI: still burning. Got better, but still has some dysuria. Used Ceftin.  Pain in joint, ankle and foot: pain in both sides just above the ankles. Chronically swollen in theses areas. Hurts when her son pulls on her ankles to adjust her in bed.  CKD (chronic kidney disease), stage III: stable  HTN (hypertension) - controlled  Hyperlipidemia: controlled  Hypothyroid -TSH has risen. She is taking levothyroxine daily. No missed doses.  Type II or unspecified type diabetes mellitus with renal manifestations, not stated as uncontrolled(250.40) -controlled     Medications: Patient's Medications  New Prescriptions   No medications on file  Previous Medications   ASPIRIN EC 81 MG TABLET    Take 1 tablet (81 mg total) by mouth daily.   ATORVASTATIN (LIPITOR) 20 MG TABLET    Take 1 tablet (20 mg total) by mouth daily.   CALCITONIN, SALMON, (MIACALCIN/FORTICAL) 200 UNIT/ACT NASAL SPRAY    INSTILL 1 SPRAY INTO ALTERNATING NOSTRILS ONCE A DAY FOR OSTEOPROSIS   CEFUROXIME (CEFTIN) 500 MG TABLET    Take 1 tablet (500 mg total) by mouth 2 (two) times daily with a meal.   COMBIGAN 0.2-0.5 % OPHTHALMIC SOLUTION    One drop left eye twice daily   CYCLOSPORINE (RESTASIS) 0.05 % OPHTHALMIC EMULSION    Place 1 drop into both eyes 2 (two) times daily.   DEXTROMETHORPHAN-GUAIFENESIN (MUCINEX DM) 30-600 MG PER 12 HR TABLET    Take 1 tablet by mouth every 12 (twelve) hours.   DICLOFENAC SODIUM (VOLTAREN) 1 % GEL    Apply topically. Apply twice a day to legs   DOCUSATE SODIUM 100 MG CAPS    Take 100 mg by mouth 2 (two) times daily.   FERROUS SULFATE 325 (65 FE) MG TABLET    Take 1 tablet (325 mg total) by mouth 2 (two) times daily with a meal.   FUROSEMIDE (LASIX) 20 MG TABLET    Take one tablet by mouth once daily as needed to prevent fluid retention   GABAPENTIN (NEURONTIN) 300 MG CAPSULE    Take two capsules by mouth at bedtime   GLUCOSE BLOOD (BAYER CONTOUR TEST) TEST STRIP    Test blood sugar twice daily as directed. Dx: 250.00   LANSOPRAZOLE (PREVACID) 30 MG CAPSULE    Take 1 capsule (30 mg total) by mouth daily.   LATANOPROST (XALATAN) 0.005 % OPHTHALMIC SOLUTION       LEVOTHYROXINE (SYNTHROID, LEVOTHROID) 75 MCG TABLET    TAKE 1 TABLET BY MOUTH ONCE DAILY FOR THYROID SUPPLEMENT   METFORMIN (GLUCOPHAGE) 1000 MG TABLET    TAKE 1 TABLET BY MOUTH 2 TIMES DAILY TO CONTROL DIABETES   MICROLET LANCETS MISC  MULTIPLE VITAMINS-MINERALS (CENTRUM SILVER PO)    Take 1 tablet by mouth daily.   NON FORMULARY    Take 1 capsule twice daily to help with Vision   OLMESARTAN (BENICAR) 20 MG TABLET    Take 10 mg by mouth daily. Takes 1/2 tablet   OXYCODONE (ROXICODONE) 5 MG IMMEDIATE RELEASE TABLET    One every 6 hours if needed for severe pain   POLYETHYLENE GLYCOL (MIRALAX / GLYCOLAX) PACKET    Take 17 g by mouth daily.   PREGABALIN (LYRICA) 75 MG CAPSULE    Take one capsule by mouth twice daily to help with pains and nerves   SACCHAROMYCES BOULARDII (FLORASTOR) 250 MG CAPSULE    Take 1 capsule (250 mg total) by mouth 2 (two) times daily.   TOBRAMYCIN-DEXAMETHASONE (TOBRADEX) OPHTHALMIC SOLUTION       TRAMADOL (ULTRAM) 50 MG TABLET    take 1 tablet by mouth every 12 hours if needed for pain   TRAVOPROST, BENZALKONIUM, (TRAVATAN) 0.004 % OPHTHALMIC  SOLUTION    Place 1 drop into the left eye at bedtime.   TROSPIUM CHLORIDE (SANCTURA XR) 60 MG CP24    Take one tablet by mouth daily to help bladder control   VITAMIN C (ASCORBIC ACID) 250 MG TABLET    Take 250 mg by mouth daily.   ZOLPIDEM (AMBIEN) 5 MG TABLET    Take 5 mg by mouth at bedtime as needed for sleep.  Modified Medications   No medications on file  Discontinued Medications   AMOXICILLIN-CLAVULANATE (AUGMENTIN) 875-125 MG PER TABLET    Take 1 tablet by mouth 2 (two) times daily.   SACCHAROMYCES BOULARDII (FLORASTOR) 250 MG CAPSULE    Take 1 capsule (250 mg total) by mouth 2 (two) times daily.     Review of Systems  Constitutional: Positive for activity change and fatigue. Negative for fever, chills and unexpected weight change.  HENT: Positive for hearing loss. Negative for ear pain.   Eyes: Negative.        Wears corrective lenses  Respiratory: Positive for cough.        Dry cough for the last year.  Cardiovascular: Positive for leg swelling.  Gastrointestinal: Negative.   Endocrine:       History of diabetes  Genitourinary:       Incontinence  Musculoskeletal: Positive for arthralgias, back pain, gait problem and myalgias.       Tender above ankles  Skin:       Bedsores since in hospital  Neurological: Positive for weakness and numbness. Negative for dizziness, tremors, seizures, syncope, speech difficulty and light-headedness.       Numb in the feet and the lower legs  Hematological: Negative.   Psychiatric/Behavioral:       Memory loss and associated mild confusion.    Filed Vitals:   05/11/13 1512  BP: 138/80  Pulse: 100  Weight: 162 lb (73.483 kg)  SpO2: 95%   Physical Exam  Constitutional: She is oriented to person, place, and time. No distress.  Overweight.  HENT:  Severe hearing loss in the right ear  Eyes: Conjunctivae and EOM are normal. Pupils are equal, round, and reactive to light.  Neck: Normal range of motion. Neck supple. No JVD present.  No tracheal deviation present. No thyromegaly present.  Cardiovascular:  Rate controlled irregularly irregular rhythm. No murmur. No gallop.  Pulmonary/Chest: Effort normal. No respiratory distress. She has no wheezes. She has rales. She exhibits no tenderness.  Abdominal: She exhibits no  distension and no mass. There is no tenderness.  Musculoskeletal: She exhibits edema. She exhibits no tenderness.  Bilateral knee tenderness and crepitance. Mild back discomfort. Tender above ankles.  Lymphadenopathy:    She has no cervical adenopathy.  Neurological: She is alert and oriented to person, place, and time. No cranial nerve deficit. Coordination normal.  Mild memory loss.  Skin: No rash noted. She is not diaphoretic. No erythema. No pallor.  Abraded area left ankle. Healed decubitus of buttocks near intergluteal fold  Psychiatric: She has a normal mood and affect. Her behavior is normal. Thought content normal.     Labs reviewed: Appointment on 05/09/2013  Component Date Value Ref Range Status  . Glucose 05/09/2013 118* 65 - 99 mg/dL Final   Comment: Specimen received in contact with cells. No visible hemolysis                          present. However GLUC may be decreased and K increased. Clinical                          correlation indicated.  . BUN 05/09/2013 34  10 - 36 mg/dL Final  . Creatinine, Ser 05/09/2013 1.25* 0.57 - 1.00 mg/dL Final  . GFR calc non Af Amer 05/09/2013 37* >59 mL/min/1.73 Final  . GFR calc Af Amer 05/09/2013 43* >59 mL/min/1.73 Final  . BUN/Creatinine Ratio 05/09/2013 27* 11 - 26 Final  . Sodium 05/09/2013 142  134 - 144 mmol/L Final  . Potassium 05/09/2013 5.2  3.5 - 5.2 mmol/L Final  . Chloride 05/09/2013 102  97 - 108 mmol/L Final  . CO2 05/09/2013 19  18 - 29 mmol/L Final  . Calcium 05/09/2013 10.4* 8.7 - 10.3 mg/dL Final  . Total Protein 05/09/2013 6.2  6.0 - 8.5 g/dL Final  . Albumin 54/09/811903/16/2015 4.1  3.2 - 4.6 g/dL Final  . Globulin, Total 05/09/2013  2.1  1.5 - 4.5 g/dL Final  . Albumin/Globulin Ratio 05/09/2013 2.0  1.1 - 2.5 Final  . Total Bilirubin 05/09/2013 <0.2  0.0 - 1.2 mg/dL Final  . Alkaline Phosphatase 05/09/2013 85  39 - 117 IU/L Final  . AST 05/09/2013 17  0 - 40 IU/L Final  . ALT 05/09/2013 8  0 - 32 IU/L Final  . Cholesterol, Total 05/09/2013 147  100 - 199 mg/dL Final  . Triglycerides 05/09/2013 141  0 - 149 mg/dL Final  . HDL 14/78/295603/16/2015 42  >39 mg/dL Final   Comment: According to ATP-III Guidelines, HDL-C >59 mg/dL is considered a                          negative risk factor for CHD.  Marland Kitchen. VLDL Cholesterol Cal 05/09/2013 28  5 - 40 mg/dL Final  . LDL Calculated 05/09/2013 77  0 - 99 mg/dL Final  . Chol/HDL Ratio 05/09/2013 3.5  0.0 - 4.4 ratio units Final   Comment:                                   T. Chol/HDL Ratio  Men  Women                                                        1/2 Avg.Risk  3.4    3.3                                                            Avg.Risk  5.0    4.4                                                         2X Avg.Risk  9.6    7.1                                                         3X Avg.Risk 23.4   11.0  . WBC 05/09/2013 7.2  3.4 - 10.8 x10E3/uL Final  . RBC 05/09/2013 3.94  3.77 - 5.28 x10E6/uL Final  . Hemoglobin 05/09/2013 12.2  11.1 - 15.9 g/dL Final  . HCT 65/78/4696 36.7  34.0 - 46.6 % Final  . MCV 05/09/2013 93  79 - 97 fL Final  . MCH 05/09/2013 31.0  26.6 - 33.0 pg Final  . MCHC 05/09/2013 33.2  31.5 - 35.7 g/dL Final  . RDW 29/52/8413 14.2  12.3 - 15.4 % Final  . Platelets 05/09/2013 226  150 - 379 x10E3/uL Final  . Neutrophils Relative % 05/09/2013 44   Final  . Lymphs 05/09/2013 37   Final  . Monocytes 05/09/2013 10   Final  . Eos 05/09/2013 7   Final  . Basos 05/09/2013 1   Final  . Neutrophils Absolute 05/09/2013 3.2  1.4 - 7.0 x10E3/uL Final  . Lymphocytes Absolute 05/09/2013 2.7  0.7 - 3.1  x10E3/uL Final  . Monocytes Absolute 05/09/2013 0.7  0.1 - 0.9 x10E3/uL Final  . Eosinophils Absolute 05/09/2013 0.5* 0.0 - 0.4 x10E3/uL Final  . Basophils Absolute 05/09/2013 0.1  0.0 - 0.2 x10E3/uL Final  . Immature Granulocytes 05/09/2013 1   Final  . Immature Grans (Abs) 05/09/2013 0.1  0.0 - 0.1 x10E3/uL Final  . TSH 05/09/2013 4.680* 0.450 - 4.500 uIU/mL Final  . Hemoglobin A1C 05/09/2013 6.6* 4.8 - 5.6 % Final   Comment:          Increased risk for diabetes: 5.7 - 6.4                                   Diabetes: >6.4                                   Glycemic control for adults with diabetes: <7.0  .  Estimated average glucose 05/09/2013 143   Final  Appointment on 05/02/2013  Component Date Value Ref Range Status  . Urine Culture, Routine 05/02/2013 Final report*  Final  . Result 1 05/02/2013 Klebsiella pneumoniae*  Final   Greater than 100,000 colony forming units per mL  . ANTIMICROBIAL SUSCEPTIBILITY 05/02/2013 Comment   Final   Comment:     S = Susceptible; I = Intermediate; R = Resistant                                              P = Positive; N = Negative                                      MICS are expressed in micrograms per mL                             Antibiotic                 RSLT#1    RSLT#2    RSLT#3    RSLT#4                          Amoxicillin/Clavulanic Acid    S                          Ampicillin                     R                          Cefepime                       S                          Ceftriaxone                    S                          Cefuroxime                     S                          Cephalothin                    S                          Ciprofloxacin                  S                          Ertapenem                      S  Gentamicin                     S                          Imipenem                       S                          Levofloxacin                   S                           Nitrofurantoin                 I                          Piperacillin                   R                          Tetracycline                   S                          Tobramycin                     S                          Trimethoprim/Sulfa             S  . Specific Gravity, UA 05/02/2013 1.010  1.005 - 1.030 Final  . pH, UA 05/02/2013 6.0  5.0 - 7.5 Final  . Color, UA 05/02/2013 Yellow  Yellow Final  . Appearance Ur 05/02/2013 Turbid* Clear Final  . Leukocytes, UA 05/02/2013 3+* Negative Final  . Protein, UA 05/02/2013 1+* Negative/Trace Final  . Glucose, UA 05/02/2013 Negative  Negative Final  . Ketones, UA 05/02/2013 Negative  Negative Final  . RBC, UA 05/02/2013 2+* Negative Final  . Bilirubin, UA 05/02/2013 Negative  Negative Final  . Urobilinogen, Ur 05/02/2013 0.2  0.0 - 1.9 mg/dL Final  . Nitrite, UA 04/54/0981 Negative  Negative Final      Assessment/Plan  1. Pain in joint, ankle and foot Try arthritis cream  2. CKD (chronic kidney disease), stage III stable  3. HTN (hypertension) controlled - Comprehensive metabolic panel; Future  4. Hyperlipidemia controlled  5. Hypothyroid No change in levothyroxine this visit - TSH; Future  6. Type II or unspecified type diabetes mellitus with renal manifestations, not stated as uncontrolled(250.40) controlled - Comprehensive metabolic panel; Future - Hemoglobin A1c; Future  7. UTI (lower urinary tract infection) Try Cipro. If not better, return for culture - ciprofloxacin (CIPRO) 500 MG tablet; Take 1 tablet (500 mg total) by mouth 2 (two) times daily.  Dispense: 6 tablet; Refill: 0

## 2013-05-30 ENCOUNTER — Ambulatory Visit (INDEPENDENT_AMBULATORY_CARE_PROVIDER_SITE_OTHER): Payer: Medicare Other | Admitting: Internal Medicine

## 2013-05-30 ENCOUNTER — Other Ambulatory Visit: Payer: Self-pay | Admitting: *Deleted

## 2013-05-30 ENCOUNTER — Encounter: Payer: Self-pay | Admitting: Internal Medicine

## 2013-05-30 VITALS — BP 128/70 | HR 87 | Temp 98.5°F | Resp 18 | Ht 62.0 in | Wt 163.2 lb

## 2013-05-30 DIAGNOSIS — B372 Candidiasis of skin and nail: Secondary | ICD-10-CM

## 2013-05-30 MED ORDER — NYSTATIN 100000 UNIT/GM EX CREA: 1.0000 "application " | TOPICAL_CREAM | Freq: Two times a day (BID) | CUTANEOUS | Status: DC

## 2013-05-30 MED ORDER — LEVOTHYROXINE SODIUM 75 MCG PO TABS: ORAL_TABLET | ORAL | Status: DC

## 2013-05-30 NOTE — Telephone Encounter (Signed)
[facility] 

## 2013-05-30 NOTE — Progress Notes (Signed)
Patient ID: [name] CallerD Vigilante, female   DOB: Dec 19, 1916, 80 y.o.   MRN: [id number]   [facility]  [facility]   Allergies  Allergen Reactions  . Noroxin [Norfloxacin] Swelling  . Sulfa Antibiotics Nausea And Vomiting  . Prozac [Fluoxetine Hcl] Rash    Chief Complaint  Patient presents with  . Acute Visit    2-3 weeks, red raw,     HPI: Patient is a 80 y.o. white female seen in the office today for acute visit.  She is accompanied by her son.   Does wear underwire bras.  Has pain, redness and raw area beneath right breast.   Has been having this problem for about 3 weeks Stopped wearing the bra at her son's recommendation Also has moles under both breasts but they do not bother her.  Review of Systems:  Review of Systems  Constitutional: Negative for fever and chills.  Skin: Positive for itching and rash.       soreness  Neurological: Negative for weakness.   Past Medical History  Diagnosis Date  . Diabetes type 2, uncontrolled   . Bronchitis, acute   . Anemia   . Gait disorder   . Hypertension   . Other disorder of calcium metabolism   . Myoclonus   . Hypothyroid   . Mild memory disturbance   . Hyperlipidemia   . Scoliosis   . Osteoporosis   . Eczema   . Hemoptysis   . Hyperpotassemia   . Unspecified hypertrophic and atrophic condition of skin   . Seborrheic keratosis   . Urinary tract infection, site not specified   . Hematuria, unspecified   . Open wound of knee, leg (except thigh), and ankle, without mention of complication   . Personal history of fall   . Myoclonus   . Chronic kidney disease, unspecified   . Varicose veins of lower extremities with inflammation   . Trigger finger (acquired)   . Diastasis of muscle   . Type II or unspecified type diabetes mellitus without mention of complication, not stated as uncontrolled   . Chest pain, unspecified   . Closed fracture of lumbar vertebra without mention of spinal  cord injury   . Personality change due to conditions classified elsewhere   . Closed dislocation, thoracic vertebra   . Carpal tunnel syndrome   . Other specified idiopathic peripheral neuropathy   . Unspecified constipation   . Unspecified glaucoma   . Macular degeneration (senile) of retina, unspecified   . Unspecified urinary incontinence   . Unspecified pruritic disorder   . Unspecified closed fracture of pelvis   . Unspecified vitamin D deficiency   . Other atopic dermatitis and related conditions   . Other disorder of calcium metabolism   . Diabetes with renal manifestations(250.4)   . Arthralgia of temporomandibular joint   . Insomnia, unspecified   . Obesity, unspecified   . Allergic rhinitis, cause unspecified   . Atrial fibrillation   . Unspecified hereditary and idiopathic peripheral neuropathy   . Arthropathy, unspecified, site unspecified   . History of nuclear stress test 06/06/2010    lexiscan; normal pattern of perfusion; low risk     Past Surgical History  Procedure Laterality Date  . Abdominal hysterectomy      1973  . Coronary artery bypass graft      Dr Tyrone SageGerhardt 04/1996  . Biopsy breast      2000  . Knee arthroscopy  left Dr Thurston Hole   . Lumbar spine surgery      Dr Newell Coral   . Laser laparoscopy      right eye 04/2005  . Right oophorectomy      1950  . Esophagogastroduodenoscopy N/A 08/17/2012    Procedure: ESOPHAGOGASTRODUODENOSCOPY (EGD);  Surgeon: Hilarie Fredrickson, MD;  Location: [facility];  Service: Endoscopy;  Laterality: N/A;  . Hot hemostasis N/A 08/17/2012    Procedure: HOT HEMOSTASIS (ARGON PLASMA COAGULATION/BICAP);  Surgeon: Hilarie Fredrickson, MD;  Location: [facility];  Service: Endoscopy;  Laterality: N/A;    Social History:   reports that she has never smoked. She has never used smokeless tobacco. She reports that she drinks alcohol. She reports that she does not use illicit drugs.  Family History  Problem Relation Age of Onset  .  Diabetes Mother   . Cancer Sister     colon  . Alzheimer's disease Sister     Medications: Patient's Medications  New Prescriptions   No medications on file  Previous Medications   ALREX 0.2 % SUSP    Place 1 drop into both eyes 2 (two) times daily.   ASPIRIN EC 81 MG TABLET    Take 1 tablet (81 mg total) by mouth daily.   ATORVASTATIN (LIPITOR) 20 MG TABLET    Take 1 tablet (20 mg total) by mouth daily.   CALCITONIN, SALMON, (MIACALCIN/FORTICAL) 200 UNIT/ACT NASAL SPRAY    INSTILL 1 SPRAY INTO ALTERNATING NOSTRILS ONCE A DAY FOR OSTEOPROSIS   COMBIGAN 0.2-0.5 % OPHTHALMIC SOLUTION    One drop left eye twice daily   DEXTROMETHORPHAN-GUAIFENESIN (MUCINEX DM) 30-600 MG PER 12 HR TABLET    Take 1 tablet by mouth every 12 (twelve) hours.   DICLOFENAC SODIUM (VOLTAREN) 1 % GEL    Apply topically. Apply twice a day to legs   DOCUSATE SODIUM 100 MG CAPS    Take 100 mg by mouth 2 (two) times daily.   FUROSEMIDE (LASIX) 20 MG TABLET    Take one tablet by mouth once daily as needed to prevent fluid retention   GABAPENTIN (NEURONTIN) 300 MG CAPSULE    Take two capsules by mouth at bedtime   GLUCOSE BLOOD (BAYER CONTOUR TEST) TEST STRIP    Test blood sugar twice daily as directed. Dx: 250.00   LATANOPROST (XALATAN) 0.005 % OPHTHALMIC SOLUTION    Place 1 drop into both eyes at bedtime.    LEVOTHYROXINE (SYNTHROID, LEVOTHROID) 75 MCG TABLET    Take one tablet by mouth once daily for thyroid supplement   METFORMIN (GLUCOPHAGE) 1000 MG TABLET    TAKE 1 TABLET BY MOUTH 2 TIMES DAILY TO CONTROL DIABETES   MICROLET LANCETS MISC       MULTIPLE VITAMINS-MINERALS (CENTRUM SILVER PO)    Take 1 tablet by mouth daily.   NON FORMULARY    Take 1 capsule twice daily to help with Vision   OLMESARTAN (BENICAR) 20 MG TABLET    Take 10 mg by mouth daily. Takes 1/2 tablet   OXYCODONE (ROXICODONE) 5 MG IMMEDIATE RELEASE TABLET    One every 6 hours if needed for severe pain   POLYETHYLENE GLYCOL (MIRALAX / GLYCOLAX)  PACKET    Take 17 g by mouth daily.   PREGABALIN (LYRICA) 75 MG CAPSULE    Take one capsule by mouth twice daily to help with pains and nerves   SACCHAROMYCES BOULARDII (FLORASTOR) 250 MG CAPSULE    Take 1 capsule (250 mg total) by mouth  2 (two) times daily.   TOBRAMYCIN-DEXAMETHASONE (TOBRADEX) OPHTHALMIC SOLUTION    Place 1 drop into the left eye as needed.    TRAMADOL (ULTRAM) 50 MG TABLET    take 1 tablet by mouth every 12 hours if needed for pain   TROSPIUM CHLORIDE (SANCTURA XR) 60 MG CP24    Take one tablet by mouth daily to help bladder control   VITAMIN C (ASCORBIC ACID) 250 MG TABLET    Take 250 mg by mouth daily.   ZOLPIDEM (AMBIEN) 5 MG TABLET    Take 5 mg by mouth at bedtime as needed for sleep.  Modified Medications   Modified Medication Previous Medication   LANSOPRAZOLE (PREVACID) 30 MG CAPSULE lansoprazole (PREVACID) 30 MG capsule      Take 30 mg by mouth as needed.    Take 1 capsule (30 mg total) by mouth daily.  Discontinued Medications   CEFUROXIME (CEFTIN) 500 MG TABLET    Take 1 tablet (500 mg total) by mouth 2 (two) times daily with a meal.   CIPROFLOXACIN (CIPRO) 500 MG TABLET    Take 1 tablet (500 mg total) by mouth 2 (two) times daily.   CYCLOSPORINE (RESTASIS) 0.05 % OPHTHALMIC EMULSION    Place 1 drop into both eyes 2 (two) times daily.   TRAVOPROST, BENZALKONIUM, (TRAVATAN) 0.004 % OPHTHALMIC SOLUTION    Place 1 drop into the left eye at bedtime.     Physical Exam: Filed Vitals:   05/30/13 1546  BP: 128/70  Pulse: 87  Temp: 98.5 F (36.9 C)  TempSrc: Oral  Resp: 18  Height: 5\' 2"  (1.575 m)  Weight: 163 lb 3.2 oz (74.027 kg)  SpO2: 95%  Physical Exam  Constitutional: She appears well-developed and well-nourished. No distress.  Skin: Skin is warm.  Beneath both breasts, she has several actinic keratoses; right breast has moist, excoriated, erythematous rash present beneath it more to the medial aspect, one raw area    Labs reviewed: Basic Metabolic  Panel:  Recent Labs  11/01/12 0858 02/04/13 0920 05/09/13 0956  NA 142 142 142  K 5.0 4.5 5.2  CL 99 101 102  CO2 21 20 19   GLUCOSE 104* 118* 118*  BUN 19 38* 34  CREATININE 1.19* 1.23* 1.25*  CALCIUM 11.9* 10.9* 10.4*  TSH 1.400 2.200 4.680*   Liver Function Tests:  Recent Labs  08/15/12 0922 08/18/12 0436 10/08/12 0505 11/01/12 0858 02/04/13 0920 05/09/13 0956  AST 154* 49* 18 22 14 17   ALT 175* 128* 10 15 12 8   ALKPHOS 46 52 70 78 76 85  BILITOT 0.5 0.2* 0.2* 0.2 <0.2 <0.2  PROT 5.5* 5.7* 6.1 6.8 6.5 6.2  ALBUMIN 2.8* 2.8* 3.1*  --   --   --   CBC:  Recent Labs  10/07/12 1834 10/08/12 0505  11/01/12 0858 02/04/13 0920 05/09/13 0956  WBC 6.9 6.1  < > 6.6 6.6 7.2  NEUTROABS 3.5  --   < > 3.9 3.6 3.2  HGB 10.5* 9.5*  < > 10.9* 11.9 12.2  HCT 33.1* 30.0*  < > 33.9* 34.9 36.7  MCV 88.3 88.5  < > 88 91 93  PLT 275 254  --   --   --  226  < > = values in this interval not displayed. Lipid Panel:  Recent Labs  02/04/13 0920 05/09/13 0956  HDL 41 42  LDLCALC 75 77  TRIG 223* 141  CHOLHDL 3.9 3.5   Lab Results  Component Value Date  HGBA1C 6.6* 05/09/2013    Assessment/Plan 1. Candidal skin infection -beneath right breast -begin nystatin cream bid until resolution -avoid wearing bra until it feels better and would start back with a bra w/o an underwire -keep area beneath breasts as dry as possible to avoid recurrence  Next appt:  Keep as scheduled with Dr. Chilton Si

## 2013-06-02 ENCOUNTER — Other Ambulatory Visit: Payer: Self-pay

## 2013-06-03 ENCOUNTER — Other Ambulatory Visit: Payer: Self-pay | Admitting: *Deleted

## 2013-06-03 MED ORDER — FUROSEMIDE 20 MG PO TABS: ORAL_TABLET | ORAL | Status: DC

## 2013-06-03 NOTE — Telephone Encounter (Signed)
rx faxed to pharmacy and pt's fu appt scheduled for July.

## 2013-06-13 ENCOUNTER — Other Ambulatory Visit: Payer: Self-pay | Admitting: *Deleted

## 2013-06-13 MED ORDER — TROSPIUM CHLORIDE ER 60 MG PO CP24: ORAL_CAPSULE | ORAL | Status: DC

## 2013-06-21 ENCOUNTER — Encounter: Payer: Self-pay | Admitting: Internal Medicine

## 2013-06-27 ENCOUNTER — Other Ambulatory Visit: Payer: Self-pay | Admitting: Internal Medicine

## 2013-06-27 MED ORDER — CALCITONIN (SALMON) 200 UNIT/ACT NA SOLN: NASAL | Status: DC

## 2013-06-27 NOTE — Telephone Encounter (Signed)
[facility]

## 2013-07-12 ENCOUNTER — Other Ambulatory Visit: Payer: Self-pay | Admitting: *Deleted

## 2013-07-12 MED ORDER — TRAMADOL HCL 50 MG PO TABS: ORAL_TABLET | ORAL | Status: DC

## 2013-07-12 NOTE — Telephone Encounter (Signed)
[facility] 

## 2013-07-25 ENCOUNTER — Other Ambulatory Visit: Payer: Self-pay | Admitting: Internal Medicine

## 2013-07-25 ENCOUNTER — Other Ambulatory Visit: Payer: Self-pay | Admitting: *Deleted

## 2013-07-25 MED ORDER — METFORMIN HCL 1000 MG PO TABS: ORAL_TABLET | ORAL | Status: DC

## 2013-07-25 NOTE — Telephone Encounter (Signed)
[facility] 

## 2013-08-03 ENCOUNTER — Other Ambulatory Visit: Payer: Self-pay | Admitting: Internal Medicine

## 2013-08-03 ENCOUNTER — Other Ambulatory Visit: Payer: Medicare Other

## 2013-08-03 DIAGNOSIS — R300 Dysuria: Secondary | ICD-10-CM

## 2013-08-04 LAB — URINALYSIS
Bilirubin, UA: NEGATIVE
Glucose, UA: NEGATIVE
Ketones, UA: NEGATIVE
NITRITE UA: NEGATIVE
PH UA: 6.5 (ref 5.0–7.5)
Specific Gravity, UA: 1.017 (ref 1.005–1.030)
Urobilinogen, Ur: 0.2 mg/dL (ref 0.0–1.9)

## 2013-08-05 ENCOUNTER — Encounter: Payer: Self-pay | Admitting: Internal Medicine

## 2013-08-05 ENCOUNTER — Other Ambulatory Visit: Payer: Self-pay | Admitting: *Deleted

## 2013-08-05 ENCOUNTER — Telehealth: Payer: Self-pay | Admitting: *Deleted

## 2013-08-05 LAB — URINE CULTURE

## 2013-08-05 MED ORDER — CIPROFLOXACIN HCL 250 MG PO TABS: 250.0000 mg | ORAL_TABLET | Freq: Two times a day (BID) | ORAL | Status: DC

## 2013-08-05 NOTE — Telephone Encounter (Signed)
Spoke with patient's son regarding the medication (Cipro 250 mg) for UTI . Son stated that he understood the instructions and would pick up the medication in 1 hour.SRice  RMA

## 2013-08-05 NOTE — Telephone Encounter (Signed)
Message copied by Lamont SnowballICE, SHARON L on Fri Aug 05, 2013  4:56 PM ------      Message from: Kimber RelicGREEN, ARTHUR G      Created: Fri Aug 05, 2013  4:35 PM       Call patient. Start Cipro 250 mg (14) 1 twice daily for UTI If not on an antibiotic already. ------

## 2013-08-22 ENCOUNTER — Other Ambulatory Visit: Payer: Self-pay | Admitting: *Deleted

## 2013-08-22 MED ORDER — PREGABALIN 75 MG PO CAPS: ORAL_CAPSULE | ORAL | Status: DC

## 2013-08-22 NOTE — Telephone Encounter (Signed)
[facility] 

## 2013-08-24 ENCOUNTER — Other Ambulatory Visit: Payer: Self-pay | Admitting: Internal Medicine

## 2013-09-12 ENCOUNTER — Other Ambulatory Visit: Payer: Medicare Other

## 2013-09-13 LAB — COMPREHENSIVE METABOLIC PANEL
A/G RATIO: 1.7 (ref 1.1–2.5)
ALT: 11 IU/L (ref 0–32)
AST: 12 IU/L (ref 0–40)
Albumin: 4.2 g/dL (ref 3.2–4.6)
Alkaline Phosphatase: 113 IU/L (ref 39–117)
BUN / CREAT RATIO: 21 (ref 11–26)
BUN: 29 mg/dL (ref 10–36)
CALCIUM: 10.3 mg/dL (ref 8.7–10.3)
CO2: 21 mmol/L (ref 18–29)
CREATININE: 1.36 mg/dL — AB (ref 0.57–1.00)
Chloride: 100 mmol/L (ref 97–108)
GFR calc Af Amer: 38 mL/min/{1.73_m2} — ABNORMAL LOW (ref >59)
GFR calc non Af Amer: 33 mL/min/{1.73_m2} — ABNORMAL LOW (ref >59)
Globulin, Total: 2.5 g/dL (ref 1.5–4.5)
Glucose: 158 mg/dL — ABNORMAL HIGH (ref 65–99)
POTASSIUM: 4.8 mmol/L (ref 3.5–5.2)
SODIUM: 139 mmol/L (ref 134–144)
Total Bilirubin: 0.3 mg/dL (ref 0.0–1.2)
Total Protein: 6.7 g/dL (ref 6.0–8.5)

## 2013-09-13 LAB — HEMOGLOBIN A1C
Est. average glucose Bld gHb Est-mCnc: 163 mg/dL
Hgb A1c MFr Bld: 7.3 % — ABNORMAL HIGH (ref 4.8–5.6)

## 2013-09-13 LAB — TSH: TSH: 3.69 u[IU]/mL (ref 0.450–4.500)

## 2013-09-14 ENCOUNTER — Encounter: Payer: Self-pay | Admitting: Internal Medicine

## 2013-09-14 ENCOUNTER — Ambulatory Visit (INDEPENDENT_AMBULATORY_CARE_PROVIDER_SITE_OTHER): Payer: Medicare Other | Admitting: Internal Medicine

## 2013-09-14 VITALS — BP 130/82 | HR 78 | Temp 98.3°F | Wt 165.0 lb

## 2013-09-14 DIAGNOSIS — E1149 Type 2 diabetes mellitus with other diabetic neurological complication: Secondary | ICD-10-CM

## 2013-09-14 DIAGNOSIS — M25579 Pain in unspecified ankle and joints of unspecified foot: Secondary | ICD-10-CM

## 2013-09-14 DIAGNOSIS — F0390 Unspecified dementia without behavioral disturbance: Secondary | ICD-10-CM

## 2013-09-14 DIAGNOSIS — R531 Weakness: Secondary | ICD-10-CM

## 2013-09-14 DIAGNOSIS — R5383 Other fatigue: Secondary | ICD-10-CM

## 2013-09-14 DIAGNOSIS — E785 Hyperlipidemia, unspecified: Secondary | ICD-10-CM

## 2013-09-14 DIAGNOSIS — I482 Chronic atrial fibrillation, unspecified: Secondary | ICD-10-CM

## 2013-09-14 DIAGNOSIS — E1142 Type 2 diabetes mellitus with diabetic polyneuropathy: Secondary | ICD-10-CM

## 2013-09-14 DIAGNOSIS — E1129 Type 2 diabetes mellitus with other diabetic kidney complication: Secondary | ICD-10-CM

## 2013-09-14 DIAGNOSIS — N390 Urinary tract infection, site not specified: Secondary | ICD-10-CM

## 2013-09-14 DIAGNOSIS — R5381 Other malaise: Secondary | ICD-10-CM

## 2013-09-14 DIAGNOSIS — I4891 Unspecified atrial fibrillation: Secondary | ICD-10-CM

## 2013-09-14 DIAGNOSIS — N183 Chronic kidney disease, stage 3 unspecified: Secondary | ICD-10-CM

## 2013-09-14 DIAGNOSIS — N182 Chronic kidney disease, stage 2 (mild): Secondary | ICD-10-CM | POA: Insufficient documentation

## 2013-09-14 DIAGNOSIS — R059 Cough, unspecified: Secondary | ICD-10-CM

## 2013-09-14 DIAGNOSIS — I10 Essential (primary) hypertension: Secondary | ICD-10-CM

## 2013-09-14 DIAGNOSIS — E1140 Type 2 diabetes mellitus with diabetic neuropathy, unspecified: Secondary | ICD-10-CM | POA: Insufficient documentation

## 2013-09-14 DIAGNOSIS — R05 Cough: Secondary | ICD-10-CM

## 2013-09-14 NOTE — Progress Notes (Signed)
Patient ID: [name], female   DOB: Nov 15, 1916, 80 y.o.   MRN: [id number]    Location:    [facility]  Place of Service:  OFFICE    Allergies  Allergen Reactions  . Noroxin [Norfloxacin] Swelling  . Sulfa Antibiotics Nausea And Vomiting  . Prozac [Fluoxetine Hcl] Rash    Chief Complaint  Patient presents with  . Medical Management of Chronic Issues    4 month follow-up, discuss labs (copy printed)   . Neurologic Problem    Neuropathy in legs and ankles (ongoing concern)  . Skin Problem    Examine rough area on face   . Body Jerking    Patient c/o all over body jerks   . Cough    Ongoing cough x couple years, taking mucinex. Patient c/o wheezing     HPI:  Chronic atrial fibrillation: rate controlled  CKD (chronic kidney disease), stage III: stable  Dementia, without behavioral disturbance: unchanged  Essential hypertension: controlled  Hyperlipidemia: controlled  Type II or unspecified type diabetes mellitus with renal manifestations, not stated as uncontrolled(250.40): control is worse. Gaining weight and not compliant with diet.  Weakness: generalized and chronicic  Cough: not productive and rattling. Using Mucinex DM for control  Diabetic polyneuropathy associated with type 2 diabetes mellitus  Pain in joint, ankle and foot, unspecified laterality : bilateral and related to ankle edema and neuropathy  UTI (lower urinary tract infection): recurrent issue. Asymptomatic at this time.    Medications: Patient's Medications  New Prescriptions   No medications on file  Previous Medications   ALREX 0.2 % SUSP    Place 1 drop into both eyes 2 (two) times daily.   ASPIRIN EC 81 MG TABLET    Take 1 tablet (81 mg total) by mouth daily.   ATORVASTATIN (LIPITOR) 20 MG TABLET    Take 1 tablet (20 mg total) by mouth daily.   ATORVASTATIN (LIPITOR) 20 MG TABLET    take 1 tablet by mouth once daily TO LOWER CHOLESTEROL   BENICAR 20 MG TABLET    TAKE 1/2 TABLET BY MOUTH  ONCE A DAY FOR BLOOD PRESSURE   CALCITONIN, SALMON, (MIACALCIN/FORTICAL) 200 UNIT/ACT NASAL SPRAY    INSTILL 1 SPRAY INTO ALTERNATING NOSTRILS ONCE A DAY FOR OSTEOPROSIS   CIPROFLOXACIN (CIPRO) 250 MG TABLET    Take 1 tablet (250 mg total) by mouth 2 (two) times daily.   COMBIGAN 0.2-0.5 % OPHTHALMIC SOLUTION    One drop left eye twice daily   DEXTROMETHORPHAN-GUAIFENESIN (MUCINEX DM) 30-600 MG PER 12 HR TABLET    Take 1 tablet by mouth every 12 (twelve) hours.   DICLOFENAC SODIUM (VOLTAREN) 1 % GEL    Apply topically. Apply twice a day to legs   DOCUSATE SODIUM 100 MG CAPS    Take 100 mg by mouth 2 (two) times daily.   FUROSEMIDE (LASIX) 20 MG TABLET    Take one tablet by mouth once daily as needed to prevent fluid retention   GABAPENTIN (NEURONTIN) 300 MG CAPSULE    Take two capsules by mouth at bedtime   GLUCOSE BLOOD (BAYER CONTOUR TEST) TEST STRIP    Test blood sugar twice daily as directed. Dx: 250.00   LANSOPRAZOLE (PREVACID) 30 MG CAPSULE    Take 30 mg by mouth as needed.   LATANOPROST (XALATAN) 0.005 % OPHTHALMIC SOLUTION    Place 1 drop into both eyes at bedtime.    LEVOTHYROXINE (SYNTHROID, LEVOTHROID) 75 MCG TABLET    Take one  tablet by mouth once daily for thyroid supplement   METFORMIN (GLUCOPHAGE) 1000 MG TABLET    Take one tablet by mouth twice daily to control diabetes   MICROLET LANCETS MISC    USE AS DIRECTED   MULTIPLE VITAMINS-MINERALS (CENTRUM SILVER PO)    Take 1 tablet by mouth daily.   NON FORMULARY    Take 1 capsule twice daily to help with Vision   NYSTATIN CREAM (MYCOSTATIN)    Apply 1 application topically 2 (two) times daily. To rash under right breast   OXYCODONE (ROXICODONE) 5 MG IMMEDIATE RELEASE TABLET    One every 6 hours if needed for severe pain   POLYETHYLENE GLYCOL (MIRALAX / GLYCOLAX) PACKET    Take 17 g by mouth daily.   PREGABALIN (LYRICA) 75 MG CAPSULE    Take one capsule by mouth twice daily to help with pains and nerves   SACCHAROMYCES BOULARDII  (FLORASTOR) 250 MG CAPSULE    Take 1 capsule (250 mg total) by mouth 2 (two) times daily.   TOBRAMYCIN-DEXAMETHASONE (TOBRADEX) OPHTHALMIC SOLUTION    Place 1 drop into the left eye as needed.    TRAMADOL (ULTRAM) 50 MG TABLET    Take one tablet by mouth every 12 hours as needed for pain   TROSPIUM CHLORIDE (SANCTURA XR) 60 MG CP24    Take one tablet by mouth daily to help bladder control   VITAMIN C (ASCORBIC ACID) 250 MG TABLET    Take 250 mg by mouth daily.   ZOLPIDEM (AMBIEN) 5 MG TABLET    Take 5 mg by mouth at bedtime as needed for sleep.  Modified Medications   No medications on file  Discontinued Medications   No medications on file     Review of Systems  Constitutional: Positive for activity change and fatigue. Negative for fever, chills and unexpected weight change.  HENT: Positive for hearing loss. Negative for ear pain.   Eyes: Negative.        Wears corrective lenses  Respiratory: Positive for cough.        Dry cough for the last year.  Cardiovascular: Positive for leg swelling.  Gastrointestinal: Negative.   Endocrine:       History of diabetes  Genitourinary:       Incontinence  Musculoskeletal: Positive for arthralgias, back pain, gait problem and myalgias.       Tender above ankles  Skin:       Bedsores since in hospital  Neurological: Positive for weakness and numbness. Negative for dizziness, tremors, seizures, syncope, speech difficulty and light-headedness.       Numb in the feet and the lower legs  Hematological: Negative.   Psychiatric/Behavioral:       Memory loss and associated mild confusion.    Filed Vitals:   09/14/13 1334  BP: 130/82  Pulse: 78  Temp: 98.3 F (36.8 C)  TempSrc: Oral  Weight: 165 lb (74.844 kg)  SpO2: 97%   Body mass index is 30.17 kg/(m^2).  Physical Exam  Constitutional: She is oriented to person, place, and time. No distress.  Overweight.  HENT:  Severe hearing loss in the right ear  Eyes: Conjunctivae and EOM are  normal. Pupils are equal, round, and reactive to light.  Neck: Normal range of motion. Neck supple. No JVD present. No tracheal deviation present. No thyromegaly present.  Cardiovascular:  Rate controlled irregularly irregular rhythm. No murmur. No gallop.  Pulmonary/Chest: Effort normal. No respiratory distress. She has no wheezes. She has  rales. She exhibits no tenderness.  Abdominal: She exhibits no distension and no mass. There is no tenderness.  Musculoskeletal: She exhibits edema. She exhibits no tenderness.  Bilateral knee tenderness and crepitance. Mild back discomfort. Tender above ankles.  Lymphadenopathy:    She has no cervical adenopathy.  Neurological: She is alert and oriented to person, place, and time. No cranial nerve deficit. Coordination normal.  Mild memory loss.  Skin: No rash noted. She is not diaphoretic. No erythema. No pallor.  Abraded area left ankle. Healed decubitus of buttocks near intergluteal fold  Psychiatric: She has a normal mood and affect. Her behavior is normal. Thought content normal.     Labs reviewed: Appointment on 09/12/2013  Component Date Value Ref Range Status  . Glucose 09/12/2013 158* 65 - 99 mg/dL Final  . BUN 82/95/6213 29  10 - 36 mg/dL Final  . Creatinine, Ser 09/12/2013 1.36* 0.57 - 1.00 mg/dL Final  . GFR calc non Af Amer 09/12/2013 33* >59 mL/min/1.73 Final  . GFR calc Af Amer 09/12/2013 38* >59 mL/min/1.73 Final  . BUN/Creatinine Ratio 09/12/2013 21  11 - 26 Final  . Sodium 09/12/2013 139  134 - 144 mmol/L Final  . Potassium 09/12/2013 4.8  3.5 - 5.2 mmol/L Final  . Chloride 09/12/2013 100  97 - 108 mmol/L Final  . CO2 09/12/2013 21  18 - 29 mmol/L Final  . Calcium 09/12/2013 10.3  8.7 - 10.3 mg/dL Final  . Total Protein 09/12/2013 6.7  6.0 - 8.5 g/dL Final  . Albumin 08/65/7846 4.2  3.2 - 4.6 g/dL Final  . Globulin, Total 09/12/2013 2.5  1.5 - 4.5 g/dL Final  . Albumin/Globulin Ratio 09/12/2013 1.7  1.1 - 2.5 Final  . Total  Bilirubin 09/12/2013 0.3  0.0 - 1.2 mg/dL Final  . Alkaline Phosphatase 09/12/2013 113  39 - 117 IU/L Final  . AST 09/12/2013 12  0 - 40 IU/L Final  . ALT 09/12/2013 11  0 - 32 IU/L Final  . Hemoglobin A1C 09/12/2013 7.3* 4.8 - 5.6 % Final   Comment:          Increased risk for diabetes: 5.7 - 6.4                                   Diabetes: >6.4                                   Glycemic control for adults with diabetes: <7.0  . Estimated average glucose 09/12/2013 163   Final  . TSH 09/12/2013 3.690  0.450 - 4.500 uIU/mL Final  Appointment on 08/03/2013  Component Date Value Ref Range Status  . Specific Gravity, UA 08/03/2013 1.017  1.005 - 1.030 Final  . pH, UA 08/03/2013 6.5  5.0 - 7.5 Final  . Color, UA 08/03/2013 Yellow  Yellow Final  . Appearance Ur 08/03/2013 Turbid* Clear Final  . Leukocytes, UA 08/03/2013 3+* Negative Final  . Protein, UA 08/03/2013 2+* Negative/Trace Final  . Glucose, UA 08/03/2013 Negative  Negative Final  . Ketones, UA 08/03/2013 Negative  Negative Final  . RBC, UA 08/03/2013 2+* Negative Final  . Bilirubin, UA 08/03/2013 Negative  Negative Final  . Urobilinogen, Ur 08/03/2013 0.2  0.0 - 1.9 mg/dL Final  . Nitrite, UA 96/29/5284 Negative  Negative Final  . Urine Culture, Routine 08/03/2013 Final  report*  Final  . Result 1 08/03/2013 Escherichia coli*  Final   Greater than 100,000 colony forming units per mL  . ANTIMICROBIAL SUSCEPTIBILITY 08/03/2013 Comment   Final   Comment:       ** S = Susceptible; I = Intermediate; R = Resistant **                                             P = Positive; N = Negative                                      MICS are expressed in micrograms per mL                             Antibiotic                 RSLT#1    RSLT#2    RSLT#3    RSLT#4                          Amoxicillin/Clavulanic Acid    S                          Ampicillin                     R                          Cefepime                       S                           Ceftriaxone                    S                          Cefuroxime                     S                          Cephalothin                    S                          Ciprofloxacin                  S                          Ertapenem                      S                          Gentamicin  S                          Imipenem                       S                          Levofloxacin                   S                          Nitrofurantoin                 S                          Piperacillin                   S                          Tetracycline                   R                          Tobramycin                     I                          Trimethoprim/Sulfa             R      Assessment/Plan  1. Chronic atrial fibrillation Rate controlled  2. CKD (chronic kidney disease), stage III - Comprehensive metabolic panel; Future  3. Dementia, without behavioral disturbance uunchanged  4. Essential hypertension - Comprehensive metabolic panel; Future  5. Hyperlipidemia - Lipid panel; Future  6. Type II or unspecified type diabetes mellitus with renal manifestations, not stated as uncontrolled(250.40) - Hemoglobin A1c; Future - Comprehensive metabolic panel; Future  7. Weakness chronic and unchanged  8. Cough Continue current medications  9. Diabetic polyneuropathy associated with type 2 diabetes mellitus unchanged  10. Pain in joint, ankle and foot, unspecified laterality unchanged  11. UTI (lower urinary tract infection) Free of infection at this time

## 2013-09-19 ENCOUNTER — Other Ambulatory Visit: Payer: Self-pay | Admitting: *Deleted

## 2013-09-19 MED ORDER — GABAPENTIN 300 MG PO CAPS: ORAL_CAPSULE | ORAL | Status: DC

## 2013-09-19 MED ORDER — LANSOPRAZOLE 30 MG PO CPDR: 30.0000 mg | DELAYED_RELEASE_CAPSULE | ORAL | Status: DC | PRN

## 2013-09-19 NOTE — Telephone Encounter (Signed)
[facility] 

## 2013-10-17 ENCOUNTER — Telehealth: Payer: Self-pay | Admitting: *Deleted

## 2013-10-17 DIAGNOSIS — I4891 Unspecified atrial fibrillation: Secondary | ICD-10-CM

## 2013-10-17 NOTE — Telephone Encounter (Signed)
I may have overlooked this. Please set up INR through [facility].

## 2013-10-17 NOTE — Telephone Encounter (Signed)
Order Placed

## 2013-10-17 NOTE — Telephone Encounter (Signed)
Patient son called and stated that they were suppose to have PT set up for patient at last appointment and they haven't heard anything from anyone yet. They have used [place] before in the past. I reviewed OV note and nothing stated about PT. Please Advise.

## 2013-10-18 DIAGNOSIS — E1129 Type 2 diabetes mellitus with other diabetic kidney complication: Secondary | ICD-10-CM

## 2013-10-18 DIAGNOSIS — N183 Chronic kidney disease, stage 3 unspecified: Secondary | ICD-10-CM

## 2013-10-18 DIAGNOSIS — I129 Hypertensive chronic kidney disease with stage 1 through stage 4 chronic kidney disease, or unspecified chronic kidney disease: Secondary | ICD-10-CM

## 2013-10-18 DIAGNOSIS — I4891 Unspecified atrial fibrillation: Secondary | ICD-10-CM

## 2013-10-21 NOTE — Telephone Encounter (Signed)
Verbal order given to [facility] with [facility] # [phone]

## 2013-10-26 ENCOUNTER — Other Ambulatory Visit: Payer: Self-pay | Admitting: *Deleted

## 2013-10-26 DIAGNOSIS — B372 Candidiasis of skin and nail: Secondary | ICD-10-CM

## 2013-10-26 MED ORDER — NYSTATIN 100000 UNIT/GM EX CREA: 1.0000 "application " | TOPICAL_CREAM | Freq: Two times a day (BID) | CUTANEOUS | Status: DC

## 2013-10-26 NOTE — Telephone Encounter (Signed)
[facility] 

## 2013-10-28 ENCOUNTER — Telehealth: Payer: Self-pay | Admitting: *Deleted

## 2013-10-28 NOTE — Telephone Encounter (Signed)
[name] with [facility] called and wanted to know why INR was ordered to be done on patient when patient is not on Coumadin. Reviewed patient's chart and it doesn't look like it was suppose to be. Called patient's son and he stated that he and Dr. Chilton Si discussed ordering Physical Therapy for leg weakness. Confirmed and order given to [facility].

## 2013-10-29 ENCOUNTER — Other Ambulatory Visit: Payer: Self-pay | Admitting: Internal Medicine

## 2013-11-08 ENCOUNTER — Ambulatory Visit (INDEPENDENT_AMBULATORY_CARE_PROVIDER_SITE_OTHER): Payer: Medicare Other | Admitting: Internal Medicine

## 2013-11-08 ENCOUNTER — Encounter: Payer: Self-pay | Admitting: Internal Medicine

## 2013-11-08 VITALS — BP 118/66 | HR 69 | Resp 10 | Ht 62.0 in | Wt 170.0 lb

## 2013-11-08 DIAGNOSIS — R300 Dysuria: Secondary | ICD-10-CM

## 2013-11-08 DIAGNOSIS — N3000 Acute cystitis without hematuria: Secondary | ICD-10-CM

## 2013-11-08 DIAGNOSIS — B3789 Other sites of candidiasis: Secondary | ICD-10-CM

## 2013-11-08 DIAGNOSIS — L98429 Non-pressure chronic ulcer of back with unspecified severity: Secondary | ICD-10-CM

## 2013-11-08 DIAGNOSIS — L89109 Pressure ulcer of unspecified part of back, unspecified stage: Secondary | ICD-10-CM

## 2013-11-08 DIAGNOSIS — L8991 Pressure ulcer of unspecified site, stage 1: Secondary | ICD-10-CM

## 2013-11-08 LAB — POCT URINALYSIS DIPSTICK
Bilirubin, UA: NEGATIVE
GLUCOSE UA: NEGATIVE
Ketones, UA: NEGATIVE
NITRITE UA: NEGATIVE
Protein, UA: NEGATIVE
Spec Grav, UA: 1.015
UROBILINOGEN UA: 0.2
pH, UA: 6

## 2013-11-08 MED ORDER — PHENAZOPYRIDINE HCL 100 MG PO TABS: 100.0000 mg | ORAL_TABLET | Freq: Three times a day (TID) | ORAL | Status: DC | PRN

## 2013-11-08 MED ORDER — NITROFURANTOIN MONOHYD MACRO 100 MG PO CAPS: 100.0000 mg | ORAL_CAPSULE | Freq: Two times a day (BID) | ORAL | Status: DC

## 2013-11-08 MED ORDER — FLUCONAZOLE 150 MG PO TABS: 150.0000 mg | ORAL_TABLET | ORAL | Status: DC

## 2013-11-08 NOTE — Progress Notes (Signed)
Patient ID: [name], female   DOB: [birth year], 80 y.o.   MRN: [id number]    Chief Complaint  Patient presents with  . Acute Visit    Yeast Infection- under left breast  x couple of months off/on- used Nystatin   . Acute Visit    UTI- burning when urinating x 7 days   . Ulcer    Discoloration at pressure ulcer site- lower back    Allergies  Allergen Reactions  . Noroxin [Norfloxacin] Swelling  . Sulfa Antibiotics Nausea And Vomiting  . Prozac [Fluoxetine Hcl] Rash   HPI 80 y/o female pt here with her son with several acute concerns  She has been having difficulty and burning with urination for a week. She also complaints of lower abdomen discomfort. No fever or chills, no nausea or vomiting Denies hematuria Appetite is fair She has also noticed rash underneath both her breasts area, left worse than right with itching and discomfort for sometime. She has used nystatin cream in past with good result but this time, it has not been of much help Son has noticed discoloration in the buttock area where she had pressure ulcer before and would like this assessed Denies any buttock area pain or breakdown of skin No diarrhea Patient is mostly on her wheelchair  ROS Negative except for the ones above No chest pain or dyspnea occassional cough  Past Medical History  Diagnosis Date  . Diabetes type 2, uncontrolled   . Bronchitis, acute   . Anemia   . Gait disorder   . Hypertension   . Other disorder of calcium metabolism   . Myoclonus   . Hypothyroid   . Mild memory disturbance   . Hyperlipidemia   . Scoliosis   . Osteoporosis   . Eczema   . Hemoptysis   . Hyperpotassemia   . Unspecified hypertrophic and atrophic condition of skin   . Seborrheic keratosis   . Urinary tract infection, site not specified   . Hematuria, unspecified   . Open wound of knee, leg (except thigh), and ankle, without mention of complication   . Personal history of fall   . Myoclonus   .  Chronic kidney disease, unspecified   . Varicose veins of lower extremities with inflammation   . Trigger finger (acquired)   . Diastasis of muscle   . Type II or unspecified type diabetes mellitus without mention of complication, not stated as uncontrolled   . Chest pain, unspecified   . Closed fracture of lumbar vertebra without mention of spinal cord injury   . Personality change due to conditions classified elsewhere   . Closed dislocation, thoracic vertebra   . Carpal tunnel syndrome   . Other specified idiopathic peripheral neuropathy   . Unspecified constipation   . Unspecified glaucoma   . Macular degeneration (senile) of retina, unspecified   . Unspecified urinary incontinence   . Unspecified pruritic disorder   . Unspecified closed fracture of pelvis   . Unspecified vitamin D deficiency   . Other atopic dermatitis and related conditions   . Other disorder of calcium metabolism   . Diabetes with renal manifestations(250.4)   . Arthralgia of temporomandibular joint   . Insomnia, unspecified   . Obesity, unspecified   . Allergic rhinitis, cause unspecified   . Atrial fibrillation   . Unspecified hereditary and idiopathic peripheral neuropathy   . Arthropathy, unspecified, site unspecified   . History of nuclear stress test 06/06/2010    lexiscan; normal pattern  of perfusion; low risk    Current Outpatient Prescriptions on File Prior to Visit  Medication Sig Dispense Refill  . ALREX 0.2 % SUSP Place 1 drop into both eyes 2 (two) times daily.      Marland Kitchen aspirin EC 81 MG tablet Take 1 tablet (81 mg total) by mouth daily.  30 tablet  0  . atorvastatin (LIPITOR) 20 MG tablet take 1 tablet by mouth once daily TO LOWER CHOLESTEROL  90 tablet  1  . BENICAR 20 MG tablet TAKE 1/2 TABLET BY MOUTH ONCE A DAY FOR BLOOD PRESSURE  45 tablet  1  . calcitonin, salmon, (MIACALCIN/FORTICAL) 200 UNIT/ACT nasal spray INSTILL 1 SPRAY INTO ALTERNATING NOSTRILS ONCE A DAY FOR OSTEOPROSIS  3.7 mL  5    . COMBIGAN 0.2-0.5 % ophthalmic solution One drop left eye twice daily      . dextromethorphan-guaiFENesin (MUCINEX DM) 30-600 MG per 12 hr tablet Take 1 tablet by mouth every 12 (twelve) hours.      . diclofenac sodium (VOLTAREN) 1 % GEL Apply topically. Apply twice a day to legs      . docusate sodium 100 MG CAPS Take 100 mg by mouth 2 (two) times daily.  10 capsule  0  . furosemide (LASIX) 20 MG tablet TAKE 1 TABLET BY MOUTH ONCE DAILY AS NEEDED TO PREVENT FLUID RETENTION  30 tablet  4  . gabapentin (NEURONTIN) 300 MG capsule Take two capsules by mouth at bedtime  60 capsule  5  . glucose blood (BAYER CONTOUR TEST) test strip Test blood sugar twice daily as directed. Dx: 250.00  100 each  6  . lansoprazole (PREVACID) 30 MG capsule Take 1 capsule (30 mg total) by mouth as needed.  30 capsule  11  . latanoprost (XALATAN) 0.005 % ophthalmic solution Place 1 drop into both eyes at bedtime.       Marland Kitchen levothyroxine (SYNTHROID, LEVOTHROID) 75 MCG tablet Take one tablet by mouth once daily for thyroid supplement  30 tablet  5  . metFORMIN (GLUCOPHAGE) 1000 MG tablet Take one tablet by mouth twice daily to control diabetes  60 tablet  6  . MICROLET LANCETS MISC USE AS DIRECTED  100 each  5  . Multiple Vitamins-Minerals (CENTRUM SILVER PO) Take 1 tablet by mouth daily.      . NON FORMULARY Take 1 capsule twice daily to help with Vision      . nystatin cream (MYCOSTATIN) Apply 1 application topically 2 (two) times daily. To rash under right breast  30 g  1  . oxyCODONE (ROXICODONE) 5 MG immediate release tablet One every 6 hours if needed for severe pain  50 tablet  0  . polyethylene glycol (MIRALAX / GLYCOLAX) packet Take 17 g by mouth daily.  14 each  0  . pregabalin (LYRICA) 75 MG capsule Take one capsule by mouth twice daily to help with pains and nerves  60 capsule  5  . saccharomyces boulardii (FLORASTOR) 250 MG capsule Take 1 capsule (250 mg total) by mouth 2 (two) times daily.  20 capsule  0  .  tobramycin-dexamethasone (TOBRADEX) ophthalmic solution Place 1 drop into the left eye as needed.       . traMADol (ULTRAM) 50 MG tablet Take one tablet by mouth every 12 hours as needed for pain  60 tablet  5  . Trospium Chloride (SANCTURA XR) 60 MG CP24 Take one tablet by mouth daily to help bladder control  30 each  5  .  vitamin C (ASCORBIC ACID) 250 MG tablet Take 250 mg by mouth daily.      Marland Kitchen zolpidem (AMBIEN) 5 MG tablet Take 5 mg by mouth at bedtime as needed for sleep.       No current facility-administered medications on file prior to visit.    Physical exam BP 118/66  Pulse 69  Resp 10  Ht  (1.575 m)  Wt 170 lb (77.111 kg)  BMI 31.09 kg/m2  SpO2 97%  General- elderly female in no acute distress Head- atraumatic, normocephalic Neck- no lymphadenopathy Cardiovascular- normal s1,s2, no murmurs Respiratory- bilateral clear to auscultation, no wheeze, no rhonchi, no crackles Abdomen- bowel sounds present, soft, suprapubic discomfort, no guarding or rigidity, no CVA tenderness Musculoskeletal- able to move all 4 extremities, on wheelchair Neurological- no focal deficit Psychiatry- alert and oriented  Skin- non blanchable erythema in sacral/ buttock area with some scab formation and dry skin, skin intact, erythematous rash underneath both breast fold, left > right  Assessment/plan  1. Dysuria U/a was positive for leukocytes and blood. Send for culture, treat empirically with nitrofurantoin 100 mg bid for 10 days, f/u on culture report, to take pyridium prn for pain, encouraged hydration. Continue cranberry juice - POCT urinalysis dipstick - Culture, Urine  2. Acute cystitis without hematuria See above  3. Candidiasis of breast Continue nystatin cream after cleaning and drying the area, to take fluconazole 150 mg once a week for 3 weeks, reassess if no improvement  4. Stage 1 skin ulcer of sacral region Advised to use wedge cushion while on wheelchair to prevent  worsening of the condition, skin intact for now

## 2013-11-10 LAB — URINE CULTURE

## 2013-11-15 ENCOUNTER — Telehealth: Payer: Self-pay | Admitting: *Deleted

## 2013-11-15 NOTE — Telephone Encounter (Signed)
Dr green to address.

## 2013-11-15 NOTE — Telephone Encounter (Signed)
Patient son, [name], called and stated that his mother was seen last week by Dr. Glade Lloyd and  had a urine culture done last week and would like to know the results. Please Advise.

## 2013-11-17 ENCOUNTER — Other Ambulatory Visit: Payer: Self-pay | Admitting: *Deleted

## 2013-11-17 ENCOUNTER — Telehealth: Payer: Self-pay | Admitting: *Deleted

## 2013-11-17 DIAGNOSIS — R300 Dysuria: Secondary | ICD-10-CM

## 2013-11-17 MED ORDER — LEVOTHYROXINE SODIUM 75 MCG PO TABS: ORAL_TABLET | ORAL | Status: DC

## 2013-11-17 NOTE — Telephone Encounter (Signed)
[facility]

## 2013-11-17 NOTE — Telephone Encounter (Signed)
Patient called regarding patient still burning when she urinates. Reviewed labs and Dr. Chilton Si is going to refer patient to Urologist. Informed son and he agreed. Placed Referral.

## 2013-11-18 ENCOUNTER — Telehealth: Payer: Self-pay

## 2013-11-18 DIAGNOSIS — R300 Dysuria: Secondary | ICD-10-CM

## 2013-11-18 NOTE — Telephone Encounter (Signed)
Spoke with patient's son [name], ok with urology referral. Order placed

## 2013-11-18 NOTE — Telephone Encounter (Signed)
Message copied by Maurice Small on Fri Nov 18, 2013  9:37 AM ------      Message from: Kimber Relic      Created: Tue Nov 15, 2013  6:18 PM       Would she like a urology referral? ------

## 2013-11-28 ENCOUNTER — Telehealth: Payer: Self-pay | Admitting: *Deleted

## 2013-11-28 MED ORDER — NYSTATIN-TRIAMCINOLONE 100000-0.1 UNIT/GM-% EX CREA: TOPICAL_CREAM | CUTANEOUS | Status: DC

## 2013-11-28 NOTE — Telephone Encounter (Signed)
Patient son, Onalee HuaDavid called and stated that the yeast infection under patient's breast is not better and nothing is working. It is spreading and painful. Please Advise.

## 2013-11-28 NOTE — Telephone Encounter (Signed)
Patient Notified and Faxed Rx to Pharmacy

## 2013-11-28 NOTE — Telephone Encounter (Signed)
Patient son, Onalee HuaDavid Notified.

## 2013-11-28 NOTE — Telephone Encounter (Signed)
Rx; Mycolog II cream (30 grams).  Apply twice daily to rash. Refill 4 times.

## 2013-12-12 ENCOUNTER — Other Ambulatory Visit: Payer: Self-pay | Admitting: *Deleted

## 2013-12-12 MED ORDER — CALCITONIN (SALMON) 200 UNIT/ACT NA SOLN: NASAL | Status: DC

## 2013-12-12 NOTE — Telephone Encounter (Signed)
[facility] 

## 2013-12-28 ENCOUNTER — Ambulatory Visit (INDEPENDENT_AMBULATORY_CARE_PROVIDER_SITE_OTHER): Payer: Medicare Other | Admitting: Internal Medicine

## 2013-12-28 ENCOUNTER — Encounter: Payer: Self-pay | Admitting: Internal Medicine

## 2013-12-28 VITALS — BP 118/62 | HR 79 | Ht 60.0 in | Wt 170.0 lb

## 2013-12-28 DIAGNOSIS — I480 Paroxysmal atrial fibrillation: Secondary | ICD-10-CM

## 2013-12-28 DIAGNOSIS — N183 Chronic kidney disease, stage 3 unspecified: Secondary | ICD-10-CM

## 2013-12-28 DIAGNOSIS — E785 Hyperlipidemia, unspecified: Secondary | ICD-10-CM

## 2013-12-28 DIAGNOSIS — I10 Essential (primary) hypertension: Secondary | ICD-10-CM

## 2013-12-28 NOTE — Patient Instructions (Signed)
Your physician wants you to follow-up in: 1 year with Dr. Hilty. You will receive a reminder letter in the mail two months in advance. If you don't receive a letter, please call our office to schedule the follow-up appointment.  

## 2013-12-28 NOTE — Progress Notes (Signed)
OFFICE NOTE  Chief Complaint:  Hospital follow-up  Primary Care Physician: Kimber Relic, MD  HPI:  [name]  is a 80 year old female now who has a history of coronary artery bypass more than 15 years ago. Recently had a nuclear stress test which was negative for ischemia. Ejection fraction was 69% and was having some atypical pleuritic-type chest pain. Recently, she has been bothered by a cough and saw Dr. Sherene Sires who feels like she has classic upper airway syndrome. Otherwise, she has lower extremity edema secondary to her saphenous vein harvest and has had weight gain about 16 pounds with a "very good appetite." She had been maintained on Paxil for history of atrial fibrillation, but was admitted recently with a very low hemoglobin of 4. She was found ultimately to have a slow GI bleed and a gastric AVM which was cauterized. She was taken off of present accident has been maintained on low-dose aspirin. After transfusion her hemoglobin has remained stable and despite all this she actually had no evidence of worsening heart failure or angina suggestive of obstructive coronary disease.  [name] returns today for follow-up. She is now 80 and is actually doing fairly well. No chest pain. There is been no recurrent GI bleeding. Her cholesterol and blood pressure monitored by her primary care provider and appear to be within normal limits. She denies any worsening shortness of breath.  PMHx:  Past Medical History  Diagnosis Date  . Diabetes type 2, uncontrolled   . Bronchitis, acute   . Anemia   . Gait disorder   . Hypertension   . Other disorder of calcium metabolism   . Myoclonus   . Hypothyroid   . Mild memory disturbance   . Hyperlipidemia   . Scoliosis   . Osteoporosis   . Eczema   . Hemoptysis   . Hyperpotassemia   . Unspecified hypertrophic and atrophic condition of skin   . Seborrheic keratosis   . Urinary tract infection, site not specified   . Hematuria,  unspecified   . Open wound of knee, leg (except thigh), and ankle, without mention of complication   . Personal history of fall   . Myoclonus   . Chronic kidney disease, unspecified   . Varicose veins of lower extremities with inflammation   . Trigger finger (acquired)   . Diastasis of muscle   . Type II or unspecified type diabetes mellitus without mention of complication, not stated as uncontrolled   . Chest pain, unspecified   . Closed fracture of lumbar vertebra without mention of spinal cord injury   . Personality change due to conditions classified elsewhere   . Closed dislocation, thoracic vertebra   . Carpal tunnel syndrome   . Other specified idiopathic peripheral neuropathy   . Unspecified constipation   . Unspecified glaucoma   . Macular degeneration (senile) of retina, unspecified   . Unspecified urinary incontinence   . Unspecified pruritic disorder   . Unspecified closed fracture of pelvis   . Unspecified vitamin D deficiency   . Other atopic dermatitis and related conditions   . Other disorder of calcium metabolism   . Diabetes with renal manifestations(250.4)   . Arthralgia of temporomandibular joint   . Insomnia, unspecified   . Obesity, unspecified   . Allergic rhinitis, cause unspecified   . Atrial fibrillation   . Unspecified hereditary and idiopathic peripheral neuropathy   . Arthropathy, unspecified, site unspecified   . History of nuclear stress test  06/06/2010    lexiscan; normal pattern of perfusion; low risk     Past Surgical History  Procedure Laterality Date  . Abdominal hysterectomy      1973  . Coronary artery bypass graft      Dr Tyrone SageGerhardt 04/1996  . Biopsy breast      2000  . Knee arthroscopy      left Dr Thurston HoleWainer   . Lumbar spine surgery      Dr Newell CoralNudelman   . Laser laparoscopy      right eye 04/2005  . Right oophorectomy      1950  . Esophagogastroduodenoscopy N/A 08/17/2012    Procedure: ESOPHAGOGASTRODUODENOSCOPY (EGD);  Surgeon: Hilarie FredricksonJohn  N Perry, MD;  Location[facility];  Service: Endoscopy;  Laterality: N/A;  . Hot hemostasis N/A 08/17/2012    Procedure: HOT HEMOSTASIS (ARGON PLASMA COAGULATION/BICAP);  Surgeon: Hilarie FredricksonJohn N Perry, MD;  Location: [facility];  Service: Endoscopy;  Laterality: N/A;    FAMHx:  Family History  Problem Relation Age of Onset  . Diabetes Mother   . Cancer Sister     colon  . Alzheimer's disease Sister     SOCHx:   reports that she has never smoked. She has never used smokeless tobacco. She reports that she drinks alcohol. She reports that she does not use illicit drugs.  ALLERGIES:  Allergies  Allergen Reactions  . Noroxin [Norfloxacin] Swelling  . Sulfa Antibiotics Nausea And Vomiting  . Prozac [Fluoxetine Hcl] Rash    ROS: A comprehensive review of systems was negative.  HOME MEDS: Current Outpatient Prescriptions  Medication Sig Dispense Refill  . ALREX 0.2 % SUSP Place 1 drop into both eyes 2 (two) times daily.    Marland Kitchen. aspirin EC 81 MG tablet Take 1 tablet (81 mg total) by mouth daily. 30 tablet 0  . atorvastatin (LIPITOR) 20 MG tablet take 1 tablet by mouth once daily TO LOWER CHOLESTEROL 90 tablet 1  . BENICAR 20 MG tablet TAKE 1/2 TABLET BY MOUTH ONCE A DAY FOR BLOOD PRESSURE 45 tablet 1  . calcitonin, salmon, (MIACALCIN/FORTICAL) 200 UNIT/ACT nasal spray INSTILL 1 SPRAY INTO ALTERNATING NOSTRILS ONCE A DAY FOR OSTEOPROSIS 3.7 mL 5  . COMBIGAN 0.2-0.5 % ophthalmic solution One drop left eye twice daily    . dextromethorphan-guaiFENesin (MUCINEX DM) 30-600 MG per 12 hr tablet Take 1 tablet by mouth every 12 (twelve) hours.    . diclofenac sodium (VOLTAREN) 1 % GEL Apply topically. Apply twice a day to legs    . docusate sodium 100 MG CAPS Take 100 mg by mouth 2 (two) times daily. 10 capsule 0  . fluconazole (DIFLUCAN) 150 MG tablet Take 1 tablet (150 mg total) by mouth once a week. 3 tablet 0  . furosemide (LASIX) 20 MG tablet TAKE 1 TABLET BY MOUTH ONCE DAILY AS NEEDED TO  PREVENT FLUID RETENTION 30 tablet 4  . gabapentin (NEURONTIN) 300 MG capsule Take two capsules by mouth at bedtime 60 capsule 5  . glucose blood (BAYER CONTOUR TEST) test strip Test blood sugar twice daily as directed. Dx: 250.00 100 each 6  . lansoprazole (PREVACID) 30 MG capsule Take 1 capsule (30 mg total) by mouth as needed. 30 capsule 11  . latanoprost (XALATAN) 0.005 % ophthalmic solution Place 1 drop into both eyes at bedtime.     Marland Kitchen. levothyroxine (SYNTHROID, LEVOTHROID) 75 MCG tablet Take one tablet by mouth once daily for thyroid supplement 30 tablet 5  . metFORMIN (GLUCOPHAGE) 1000 MG tablet  Take one tablet by mouth twice daily to control diabetes 60 tablet 6  . MICROLET LANCETS MISC USE AS DIRECTED 100 each 5  . Multiple Vitamins-Minerals (CENTRUM SILVER PO) Take 1 tablet by mouth daily.    . nitrofurantoin, macrocrystal-monohydrate, (MACROBID) 100 MG capsule Take 1 capsule (100 mg total) by mouth 2 (two) times daily. 20 capsule 0  . NON FORMULARY Take 1 capsule twice daily to help with Vision    . nystatin cream (MYCOSTATIN) Apply 1 application topically 2 (two) times daily. To rash under right breast 30 g 1  . nystatin-triamcinolone (MYCOLOG II) cream Apply twice daily to rash 30 g 4  . oxyCODONE (ROXICODONE) 5 MG immediate release tablet One every 6 hours if needed for severe pain 50 tablet 0  . phenazopyridine (PYRIDIUM) 100 MG tablet Take 1 tablet (100 mg total) by mouth 3 (three) times daily as needed for pain. 10 tablet 0  . polyethylene glycol (MIRALAX / GLYCOLAX) packet Take 17 g by mouth daily. 14 each 0  . pregabalin (LYRICA) 75 MG capsule Take one capsule by mouth twice daily to help with pains and nerves 60 capsule 5  . saccharomyces boulardii (FLORASTOR) 250 MG capsule Take 1 capsule (250 mg total) by mouth 2 (two) times daily. 20 capsule 0  . tobramycin-dexamethasone (TOBRADEX) ophthalmic solution Place 1 drop into the left eye as needed.     . traMADol (ULTRAM) 50 MG  tablet Take one tablet by mouth every 12 hours as needed for pain 60 tablet 5  . Trospium Chloride (SANCTURA XR) 60 MG CP24 Take one tablet by mouth daily to help bladder control 30 each 5  . vitamin C (ASCORBIC ACID) 250 MG tablet Take 250 mg by mouth daily.    Marland Kitchen. zolpidem (AMBIEN) 5 MG tablet Take 5 mg by mouth at bedtime as needed for sleep.     No current facility-administered medications for this visit.    LABS/IMAGING: No results found for this or any previous visit (from the past 48 hour(s)). No results found.  VITALS: BP 118/62 mmHg  Pulse 79  Ht 5' (1.524 m)  Wt 170 lb (77.111 kg)  BMI 33.20 kg/m2  EXAM: General appearance: alert and no distress Neck: no adenopathy, no carotid bruit, no JVD, supple, symmetrical, trachea midline and thyroid not enlarged, symmetric, no tenderness/mass/nodules Lungs: clear to auscultation bilaterally Heart: regular rate and rhythm, S1, S2 normal, no murmur, click, rub or gallop Abdomen: soft, non-tender; bowel sounds normal; no masses,  no organomegaly Extremities: extremities normal, atraumatic, no cyanosis or edema Pulses: 2+ and symmetric Skin: Skin color, texture, turgor normal. No rashes or lesions Neurologic: Grossly normal  EKG: Normal sinus rhythm at 79 with a right bundle branch block  ASSESSMENT: 1. Coronary artery disease status post CABG 2. Hypertension 3. Dyslipidemia 4. Diabetes type 2 5. Paroxysmal atrial fibrillation-maintaining sinus on aspirin 6. Recent gastric AVM with life-threatening GI bleeding-taken off of pradaxa  PLAN: 1.   [name] Is doing well without any worsening shortness of breath or chest pain. She is maintaining sinus rhythm on aspirin. She is not a candidate for warfarin or alternative anticoagulants due to life-threatening GI bleeding. Her cholesterol is managed by her primary care provider and blood pressure is well controlled. Plan to continue her current medications and we'll see her back annually  or sooner as necessary.  Chrystie NoseKenneth C. Hilty, MD, [facility]ACC Attending Cardiologist [facility]  HILTY,Kenneth C 12/28/2013, 5:18 PM

## 2014-01-03 ENCOUNTER — Other Ambulatory Visit: Payer: Self-pay | Admitting: Internal Medicine

## 2014-01-10 ENCOUNTER — Other Ambulatory Visit: Payer: Self-pay | Admitting: Internal Medicine

## 2014-01-10 MED ORDER — TROSPIUM CHLORIDE ER 60 MG PO CP24: ORAL_CAPSULE | ORAL | Status: DC

## 2014-01-10 NOTE — Telephone Encounter (Signed)
[facility] 

## 2014-01-13 ENCOUNTER — Other Ambulatory Visit: Payer: Medicare Other

## 2014-01-13 DIAGNOSIS — E785 Hyperlipidemia, unspecified: Secondary | ICD-10-CM

## 2014-01-13 DIAGNOSIS — N183 Chronic kidney disease, stage 3 unspecified: Secondary | ICD-10-CM

## 2014-01-13 DIAGNOSIS — I10 Essential (primary) hypertension: Secondary | ICD-10-CM

## 2014-01-13 DIAGNOSIS — E1129 Type 2 diabetes mellitus with other diabetic kidney complication: Secondary | ICD-10-CM

## 2014-01-14 LAB — LIPID PANEL
Chol/HDL Ratio: 4.3 ratio (ref 0.0–4.4)
Cholesterol, Total: 152 mg/dL (ref 100–199)
HDL: 35 mg/dL — ABNORMAL LOW
LDL Calculated: 73 mg/dL (ref 0–99)
Triglycerides: 218 mg/dL — ABNORMAL HIGH (ref 0–149)
VLDL Cholesterol Cal: 44 mg/dL — ABNORMAL HIGH (ref 5–40)

## 2014-01-14 LAB — COMPREHENSIVE METABOLIC PANEL
A/G RATIO: 1.4 (ref 1.1–2.5)
ALT: 14 IU/L (ref 0–32)
AST: 13 IU/L (ref 0–40)
Albumin: 3.9 g/dL (ref 3.2–4.6)
Alkaline Phosphatase: 120 IU/L — ABNORMAL HIGH (ref 39–117)
BUN/Creatinine Ratio: 19 (ref 11–26)
BUN: 45 mg/dL — AB (ref 10–36)
CO2: 22 mmol/L (ref 18–29)
Calcium: 10.5 mg/dL — ABNORMAL HIGH (ref 8.7–10.3)
Chloride: 99 mmol/L (ref 97–108)
Creatinine, Ser: 2.41 mg/dL — ABNORMAL HIGH (ref 0.57–1.00)
GFR, EST AFRICAN AMERICAN: 19 mL/min/{1.73_m2} — AB (ref >59)
GFR, EST NON AFRICAN AMERICAN: 17 mL/min/{1.73_m2} — AB (ref >59)
GLOBULIN, TOTAL: 2.8 g/dL (ref 1.5–4.5)
GLUCOSE: 141 mg/dL — AB (ref 65–99)
Potassium: 5.7 mmol/L — ABNORMAL HIGH (ref 3.5–5.2)
Sodium: 138 mmol/L (ref 134–144)
TOTAL PROTEIN: 6.7 g/dL (ref 6.0–8.5)
Total Bilirubin: 0.2 mg/dL (ref 0.0–1.2)

## 2014-01-14 LAB — HEMOGLOBIN A1C
Est. average glucose Bld gHb Est-mCnc: 154 mg/dL
Hgb A1c MFr Bld: 7 % — ABNORMAL HIGH (ref 4.8–5.6)

## 2014-01-17 ENCOUNTER — Encounter: Payer: Self-pay | Admitting: Internal Medicine

## 2014-01-17 ENCOUNTER — Ambulatory Visit (INDEPENDENT_AMBULATORY_CARE_PROVIDER_SITE_OTHER): Payer: Medicare Other | Admitting: Internal Medicine

## 2014-01-17 VITALS — BP 140/74 | HR 64 | Temp 97.2°F | Wt 162.0 lb

## 2014-01-17 DIAGNOSIS — I480 Paroxysmal atrial fibrillation: Secondary | ICD-10-CM

## 2014-01-17 DIAGNOSIS — S9030XA Contusion of unspecified foot, initial encounter: Secondary | ICD-10-CM | POA: Insufficient documentation

## 2014-01-17 DIAGNOSIS — N189 Chronic kidney disease, unspecified: Secondary | ICD-10-CM

## 2014-01-17 DIAGNOSIS — E875 Hyperkalemia: Secondary | ICD-10-CM

## 2014-01-17 DIAGNOSIS — N183 Chronic kidney disease, stage 3 unspecified: Secondary | ICD-10-CM

## 2014-01-17 DIAGNOSIS — D649 Anemia, unspecified: Secondary | ICD-10-CM

## 2014-01-17 DIAGNOSIS — S9032XA Contusion of left foot, initial encounter: Secondary | ICD-10-CM

## 2014-01-17 DIAGNOSIS — F0390 Unspecified dementia without behavioral disturbance: Secondary | ICD-10-CM

## 2014-01-17 DIAGNOSIS — E039 Hypothyroidism, unspecified: Secondary | ICD-10-CM

## 2014-01-17 DIAGNOSIS — E1122 Type 2 diabetes mellitus with diabetic chronic kidney disease: Secondary | ICD-10-CM

## 2014-01-17 DIAGNOSIS — I10 Essential (primary) hypertension: Secondary | ICD-10-CM

## 2014-01-17 DIAGNOSIS — E785 Hyperlipidemia, unspecified: Secondary | ICD-10-CM

## 2014-01-17 MED ORDER — OXYCODONE HCL 5 MG PO TABS: ORAL_TABLET | ORAL | Status: DC

## 2014-01-17 NOTE — Progress Notes (Signed)
Patient ID: [name] CallerD Zody, female   DOB: [birth year], 80 y.o.   MRN: [id number]    Facility  [facility]    Place of Service:   office   Allergies  Allergen Reactions  . Noroxin [Norfloxacin] Swelling  . Sulfa Antibiotics Nausea And Vomiting  . Prozac [Fluoxetine Hcl] Rash    Chief Complaint  Patient presents with  . Medical Management of Chronic Issues    blood pressure, cholesterol, blood sugar, CKD, A-Fib  . Fall    in shower, left foot swollen    HPI:  Essential hypertension: CONTROLLED  Hyperlipidemia: CONTROLLED  Type 2 diabetes mellitus with diabetic chronic kidney disease: CONTROLLED  CKD (chronic kidney disease), stage III: worse. GFR falling  Anemia, unspecified anemia type: needs follow up in future  Dementia, without behavioral disturbance: memory may be  Little worse per patient and son.  Hyperpotassemia: miild  Hypothyroidism, unspecified hypothyroidism type: recheck next visit  Paroxysmal atrial fibrillation: irregular rhythm today. Rate controlled.    Medications: Patient's Medications  New Prescriptions   No medications on file  Previous Medications   ALREX 0.2 % SUSP    Place 1 drop into both eyes 2 (two) times daily.   ASPIRIN EC 81 MG TABLET    Take 1 tablet (81 mg total) by mouth daily.   ATORVASTATIN (LIPITOR) 20 MG TABLET    take 1 tablet by mouth once daily TO LOWER CHOLESTEROL   BENICAR 20 MG TABLET    TAKE 1/2 TABLET BY MOUTH ONCE A DAY FOR BLOOD PRESSURE   CALCITONIN, SALMON, (MIACALCIN/FORTICAL) 200 UNIT/ACT NASAL SPRAY    INSTILL 1 SPRAY INTO ALTERNATING NOSTRILS ONCE A DAY FOR OSTEOPROSIS   COMBIGAN 0.2-0.5 % OPHTHALMIC SOLUTION    One drop left eye twice daily   DEXTROMETHORPHAN-GUAIFENESIN (MUCINEX DM) 30-600 MG PER 12 HR TABLET    Take 1 tablet by mouth every 12 (twelve) hours.   DICLOFENAC SODIUM (VOLTAREN) 1 % GEL    Apply topically. Apply twice a day to legs   DOCUSATE SODIUM 100 MG CAPS    Take 100 mg by mouth 2 (two) times daily.     FLUCONAZOLE (DIFLUCAN) 150 MG TABLET    Take 1 tablet (150 mg total) by mouth once a week.   FUROSEMIDE (LASIX) 20 MG TABLET    TAKE 1 TABLET BY MOUTH ONCE DAILY AS NEEDED TO PREVENT FLUID RETENTION   GABAPENTIN (NEURONTIN) 300 MG CAPSULE    Take two capsules by mouth at bedtime   GLUCOSE BLOOD (BAYER CONTOUR TEST) TEST STRIP    Test blood sugar twice daily as directed. Dx: 250.00   LANSOPRAZOLE (PREVACID) 30 MG CAPSULE    Take 1 capsule (30 mg total) by mouth as needed.   LATANOPROST (XALATAN) 0.005 % OPHTHALMIC SOLUTION    Place 1 drop into both eyes at bedtime.    LEVOTHYROXINE (SYNTHROID, LEVOTHROID) 75 MCG TABLET    Take one tablet by mouth once daily for thyroid supplement   METFORMIN (GLUCOPHAGE) 1000 MG TABLET    Take one tablet by mouth twice daily to control diabetes   MICROLET LANCETS MISC    USE AS DIRECTED   MULTIPLE VITAMINS-MINERALS (CENTRUM SILVER PO)    Take 1 tablet by mouth daily.   NITROFURANTOIN, MACROCRYSTAL-MONOHYDRATE, (MACROBID) 100 MG CAPSULE    Take 1 capsule (100 mg total) by mouth 2 (two) times daily.   NON FORMULARY    Take 1 capsule twice daily to help with Vision   NYSTATIN CREAM (  MYCOSTATIN)    Apply 1 application topically 2 (two) times daily. To rash under right breast   NYSTATIN-TRIAMCINOLONE (MYCOLOG II) CREAM    Apply twice daily to rash   OXYCODONE (ROXICODONE) 5 MG IMMEDIATE RELEASE TABLET    One every 6 hours if needed for severe pain   PHENAZOPYRIDINE (PYRIDIUM) 100 MG TABLET    TAKE 1 TABLET BY MOUTH 3 TIMES A DAY AS NEEDED FOR PAIN   POLYETHYLENE GLYCOL (MIRALAX / GLYCOLAX) PACKET    Take 17 g by mouth daily.   PREGABALIN (LYRICA) 75 MG CAPSULE    Take one capsule by mouth twice daily to help with pains and nerves   SACCHAROMYCES BOULARDII (FLORASTOR) 250 MG CAPSULE    Take 1 capsule (250 mg total) by mouth 2 (two) times daily.   TOBRAMYCIN-DEXAMETHASONE (TOBRADEX) OPHTHALMIC SOLUTION    Place 1 drop into the left eye as needed.    TRAMADOL (ULTRAM)  50 MG TABLET    Take one tablet by mouth every 12 hours as needed for pain   TROSPIUM CHLORIDE (SANCTURA XR) 60 MG CP24    Take one tablet by mouth daily to help bladder control   VITAMIN C (ASCORBIC ACID) 250 MG TABLET    Take 250 mg by mouth daily.   ZOLPIDEM (AMBIEN) 5 MG TABLET    Take 5 mg by mouth at bedtime as needed for sleep.  Modified Medications   No medications on file  Discontinued Medications   No medications on file     Review of Systems  Constitutional: Positive for activity change and fatigue. Negative for fever, chills and unexpected weight change.  HENT: Positive for hearing loss. Negative for ear pain.   Eyes: Negative.        Wears corrective lenses  Respiratory: Positive for cough.        Dry cough for the last year.  Cardiovascular: Positive for leg swelling.  Gastrointestinal: Negative.   Endocrine:       History of diabetes  Genitourinary:       Incontinence  Musculoskeletal: Positive for myalgias, back pain, arthralgias and gait problem.       Tender above ankles  Skin:       Bedsores since in hospital  Neurological: Positive for weakness and numbness. Negative for dizziness, tremors, seizures, syncope, speech difficulty and light-headedness.       Numb in the feet and the lower legs  Hematological: Negative.   Psychiatric/Behavioral:       Memory loss and associated mild confusion.    Filed Vitals:   01/17/14 1350  BP: 140/74  Pulse: 64  Temp: 97.2 F (36.2 C)  TempSrc: Oral  Weight: 162 lb (73.483 kg)  SpO2: 90%   Body mass index is 31.64 kg/(m^2).  Physical Exam  Constitutional: She is oriented to person, place, and time. No distress.  Overweight.  HENT:  Severe hearing loss in the right ear  Eyes: Conjunctivae and EOM are normal. Pupils are equal, round, and reactive to light.  Neck: Normal range of motion. Neck supple. No JVD present. No tracheal deviation present. No thyromegaly present.  Cardiovascular:  Rate controlled  irregularly irregular rhythm. No murmur. No gallop.  Pulmonary/Chest: Effort normal. No respiratory distress. She has no wheezes. She has rales. She exhibits no tenderness.  Abdominal: She exhibits no distension and no mass. There is no tenderness.  Musculoskeletal: She exhibits edema. She exhibits no tenderness.  Bilateral knee tenderness and crepitance. Mild back discomfort. Tender above  ankles. Left foot is very bruised and is swollen. Tender to palpation  Lymphadenopathy:    She has no cervical adenopathy.  Neurological: She is alert and oriented to person, place, and time. No cranial nerve deficit. Coordination normal.  Mild memory loss.  Skin: No rash noted. She is not diaphoretic. No erythema. No pallor.  Healed decubitus of buttocks near intergluteal fold  Psychiatric: She has a normal mood and affect. Her behavior is normal. Thought content normal.     Labs reviewed: Appointment on 01/13/2014  Component Date Value Ref Range Status  . Hgb A1c MFr Bld 01/13/2014 7.0* 4.8 - 5.6 % Final   Comment:          Pre-diabetes: 5.7 - 6.4          Diabetes: >6.4          Glycemic control for adults with diabetes: <7.0   . Est. average glucose Bld gHb Est-m* 01/13/2014 154   Final  . Glucose 01/13/2014 141* 65 - 99 mg/dL Final  . BUN 96/05/5407 45* 10 - 36 mg/dL Final  . Creatinine, Ser 01/13/2014 2.41* 0.57 - 1.00 mg/dL Final  . GFR calc non Af Amer 01/13/2014 17* >59 mL/min/1.73 Final  . GFR calc Af Amer 01/13/2014 19* >59 mL/min/1.73 Final  . BUN/Creatinine Ratio 01/13/2014 19  11 - 26 Final  . Sodium 01/13/2014 138  134 - 144 mmol/L Final  . Potassium 01/13/2014 5.7* 3.5 - 5.2 mmol/L Final  . Chloride 01/13/2014 99  97 - 108 mmol/L Final  . CO2 01/13/2014 22  18 - 29 mmol/L Final  . Calcium 01/13/2014 10.5* 8.7 - 10.3 mg/dL Final  . Total Protein 01/13/2014 6.7  6.0 - 8.5 g/dL Final  . Albumin 81/19/1478 3.9  3.2 - 4.6 g/dL Final  . Globulin, Total 01/13/2014 2.8  1.5 - 4.5 g/dL  Final  . Albumin/Globulin Ratio 01/13/2014 1.4  1.1 - 2.5 Final  . Total Bilirubin 01/13/2014 0.2  0.0 - 1.2 mg/dL Final  . Alkaline Phosphatase 01/13/2014 120* 39 - 117 IU/L Final  . AST 01/13/2014 13  0 - 40 IU/L Final  . ALT 01/13/2014 14  0 - 32 IU/L Final  . Cholesterol, Total 01/13/2014 152  100 - 199 mg/dL Final  . Triglycerides 01/13/2014 218* 0 - 149 mg/dL Final  . HDL 29/56/2130 35* >39 mg/dL Final   Comment: According to ATP-III Guidelines, HDL-C >59 mg/dL is considered a negative risk factor for CHD.   Marland Kitchen VLDL Cholesterol Cal 01/13/2014 44* 5 - 40 mg/dL Final  . LDL Calculated 01/13/2014 73  0 - 99 mg/dL Final  . Chol/HDL Ratio 01/13/2014 4.3  0.0 - 4.4 ratio units Final   Comment:                                   T. Chol/HDL Ratio                                             Men  Women                               1/2 Avg.Risk  3.4    3.3  Avg.Risk  5.0    4.4                                2X Avg.Risk  9.6    7.1                                3X Avg.Risk 23.4   11.0   Office Visit on 16-Sep-202015  Component Date Value Ref Range Status  . Color, UA 16-Sep-202015 Yellow   Final  . Clarity, UA 16-Sep-202015 Cloudy   Final  . Glucose, UA 16-Sep-202015 Neg   Final  . Bilirubin, UA 16-Sep-202015 Neg   Final  . Ketones, UA 16-Sep-202015 Neg   Final  . Spec Grav, UA 16-Sep-202015 1.015   Final  . Blood, UA 16-Sep-202015 Large   Final  . pH, UA 16-Sep-202015 6.0   Final  . Protein, UA 16-Sep-202015 Neg   Final  . Urobilinogen, UA 16-Sep-202015 0.2   Final  . Nitrite, UA 16-Sep-202015 Neg   Final  . Leukocytes, UA 16-Sep-202015 large (3+)   Final   Sent for culture   . Urine Culture, Routine 16-Sep-202015 Final report   Final  . Result 1 16-Sep-202015 Lactobacillus species   Final   Comment: 10,000-25,000 colony forming units per mL                          Susceptibility not normally performed on this organism.     Assessment/Plan  1. Essential hypertension - Comprehensive  metabolic panel; Future  2. Hyperlipidemia controlled  3. Type 2 diabetes mellitus with diabetic chronic kidney disease controlled - Hemoglobin A1c; Future - Comprehensive metabolic panel; Future - Microalbumin, urine; Future  4. CKD (chronic kidney disease), stage III worsening  5. Anemia, unspecified anemia type -CBC, future  6. Dementia, without behavioral disturbance Gradually worsening  7. Hyperpotassemia - Comprehensive metabolic panel; Future  8. Hypothyroidism, unspecified hypothyroidism type - TSH; Future  9. Paroxysmal atrial fibrillation Back in AF   10. Contusion of foot, left, initial encounter - oxyCODONE (ROXICODONE) 5 MG immediate release tablet; One every 6 hours if needed for severe pain  Dispense: 50 tablet; Refill: 0

## 2014-01-25 ENCOUNTER — Other Ambulatory Visit: Payer: Self-pay | Admitting: Internal Medicine

## 2014-01-30 ENCOUNTER — Other Ambulatory Visit: Payer: Self-pay | Admitting: *Deleted

## 2014-01-30 MED ORDER — ONETOUCH ULTRA SYSTEM W/DEVICE KIT: 1.0000 | PACK | Freq: Once | Status: DC

## 2014-01-30 NOTE — Telephone Encounter (Signed)
Pharmacy faxed request to change patient's blood sugar meter due to insurance not covering. One Touch Sent in to pharmacy

## 2014-02-07 ENCOUNTER — Other Ambulatory Visit: Payer: Self-pay | Admitting: Internal Medicine

## 2014-02-07 ENCOUNTER — Other Ambulatory Visit: Payer: Self-pay | Admitting: *Deleted

## 2014-02-07 MED ORDER — PREGABALIN 75 MG PO CAPS: ORAL_CAPSULE | ORAL | Status: DC

## 2014-02-07 NOTE — Telephone Encounter (Signed)
[facility] 

## 2014-02-26 ENCOUNTER — Other Ambulatory Visit: Payer: Self-pay | Admitting: Internal Medicine

## 2014-03-10 ENCOUNTER — Other Ambulatory Visit: Payer: Self-pay | Admitting: *Deleted

## 2014-03-10 MED ORDER — GABAPENTIN 300 MG PO CAPS: ORAL_CAPSULE | ORAL | Status: DC

## 2014-03-10 NOTE — Telephone Encounter (Signed)
[facility] 

## 2014-03-27 ENCOUNTER — Other Ambulatory Visit: Payer: Self-pay | Admitting: Internal Medicine

## 2014-03-31 ENCOUNTER — Other Ambulatory Visit: Payer: Self-pay | Admitting: *Deleted

## 2014-03-31 MED ORDER — BAYER CONTOUR MONITOR DEVI: Status: DC

## 2014-03-31 MED ORDER — AMBULATORY NON FORMULARY MEDICATION: Status: DC

## 2014-03-31 NOTE — Telephone Encounter (Signed)
[facility] 

## 2014-04-21 ENCOUNTER — Other Ambulatory Visit: Payer: Medicare Other

## 2014-04-21 DIAGNOSIS — E875 Hyperkalemia: Secondary | ICD-10-CM

## 2014-04-21 DIAGNOSIS — D649 Anemia, unspecified: Secondary | ICD-10-CM

## 2014-04-21 DIAGNOSIS — E039 Hypothyroidism, unspecified: Secondary | ICD-10-CM

## 2014-04-21 DIAGNOSIS — E1122 Type 2 diabetes mellitus with diabetic chronic kidney disease: Secondary | ICD-10-CM

## 2014-04-21 DIAGNOSIS — I10 Essential (primary) hypertension: Secondary | ICD-10-CM

## 2014-04-22 LAB — COMPREHENSIVE METABOLIC PANEL
A/G RATIO: 1.6 (ref 1.1–2.5)
ALT: 11 IU/L (ref 0–32)
AST: 12 IU/L (ref 0–40)
Albumin: 3.9 g/dL (ref 3.2–4.6)
Alkaline Phosphatase: 109 IU/L (ref 39–117)
BUN / CREAT RATIO: 18 (ref 11–26)
BUN: 33 mg/dL (ref 10–36)
Bilirubin Total: <0.2 mg/dL (ref 0.0–1.2)
CO2: 22 mmol/L (ref 18–29)
Calcium: 10.4 mg/dL — ABNORMAL HIGH (ref 8.7–10.3)
Chloride: 100 mmol/L (ref 97–108)
Creatinine, Ser: 1.81 mg/dL — ABNORMAL HIGH (ref 0.57–1.00)
GFR calc Af Amer: 27 mL/min/{1.73_m2} — ABNORMAL LOW (ref >59)
GFR calc non Af Amer: 23 mL/min/{1.73_m2} — ABNORMAL LOW (ref >59)
Globulin, Total: 2.5 g/dL (ref 1.5–4.5)
Glucose: 147 mg/dL — ABNORMAL HIGH (ref 65–99)
Potassium: 4.6 mmol/L (ref 3.5–5.2)
Sodium: 140 mmol/L (ref 134–144)
Total Protein: 6.4 g/dL (ref 6.0–8.5)

## 2014-04-22 LAB — CBC WITH DIFFERENTIAL
BASOS ABS: 0.1 10*3/uL (ref 0.0–0.2)
BASOS: 1 %
EOS: 4 %
Eosinophils Absolute: 0.5 10*3/uL — ABNORMAL HIGH (ref 0.0–0.4)
HEMATOCRIT: 35.2 % (ref 34.0–46.6)
Hemoglobin: 11.7 g/dL (ref 11.1–15.9)
IMMATURE GRANS (ABS): 0 10*3/uL (ref 0.0–0.1)
Immature Granulocytes: 0 %
Lymphocytes Absolute: 2.7 10*3/uL (ref 0.7–3.1)
Lymphs: 26 %
MCH: 30.4 pg (ref 26.6–33.0)
MCHC: 33.2 g/dL (ref 31.5–35.7)
MCV: 91 fL (ref 79–97)
MONOCYTES: 7 %
Monocytes Absolute: 0.8 10*3/uL (ref 0.1–0.9)
Neutrophils Absolute: 6.3 10*3/uL (ref 1.4–7.0)
Neutrophils Relative %: 62 %
RBC: 3.85 x10E6/uL (ref 3.77–5.28)
RDW: 14.3 % (ref 12.3–15.4)
WBC: 10.3 10*3/uL (ref 3.4–10.8)

## 2014-04-22 LAB — HEMOGLOBIN A1C
Est. average glucose Bld gHb Est-mCnc: 143 mg/dL
HEMOGLOBIN A1C: 6.6 % — AB (ref 4.8–5.6)

## 2014-04-22 LAB — MICROALBUMIN, URINE: MICROALBUM., U, RANDOM: 89.5 ug/mL — AB (ref 0.0–17.0)

## 2014-04-22 LAB — TSH: TSH: 2.61 u[IU]/mL (ref 0.450–4.500)

## 2014-04-25 ENCOUNTER — Ambulatory Visit (INDEPENDENT_AMBULATORY_CARE_PROVIDER_SITE_OTHER): Payer: Medicare Other | Admitting: Internal Medicine

## 2014-04-25 ENCOUNTER — Encounter: Payer: Self-pay | Admitting: Internal Medicine

## 2014-04-25 VITALS — BP 118/64 | HR 75 | Temp 97.3°F | Resp 12 | Ht 60.0 in | Wt 157.0 lb

## 2014-04-25 DIAGNOSIS — N184 Chronic kidney disease, stage 4 (severe): Secondary | ICD-10-CM | POA: Diagnosis not present

## 2014-04-25 DIAGNOSIS — N189 Chronic kidney disease, unspecified: Secondary | ICD-10-CM

## 2014-04-25 DIAGNOSIS — E1142 Type 2 diabetes mellitus with diabetic polyneuropathy: Secondary | ICD-10-CM

## 2014-04-25 DIAGNOSIS — E039 Hypothyroidism, unspecified: Secondary | ICD-10-CM | POA: Diagnosis not present

## 2014-04-25 DIAGNOSIS — I10 Essential (primary) hypertension: Secondary | ICD-10-CM

## 2014-04-25 DIAGNOSIS — K2970 Gastritis, unspecified, without bleeding: Secondary | ICD-10-CM | POA: Diagnosis not present

## 2014-04-25 DIAGNOSIS — K2990 Gastroduodenitis, unspecified, without bleeding: Secondary | ICD-10-CM | POA: Diagnosis not present

## 2014-04-25 DIAGNOSIS — E1121 Type 2 diabetes mellitus with diabetic nephropathy: Secondary | ICD-10-CM | POA: Insufficient documentation

## 2014-04-25 DIAGNOSIS — E1122 Type 2 diabetes mellitus with diabetic chronic kidney disease: Secondary | ICD-10-CM | POA: Insufficient documentation

## 2014-04-25 NOTE — Progress Notes (Signed)
Patient ID: [name], female   DOB: [birth year], 81 y.o.   MRN: [id number]    Chief Complaint  Patient presents with  . Medical Management of Chronic Issues    3 month follow up,discuss recent labs(copy printed)   . Sinus Problem    Sinus drainage x 3 months    Allergies  Allergen Reactions  . Noroxin [Norfloxacin] Swelling  . Sulfa Antibiotics Nausea And Vomiting  . Prozac [Fluoxetine Hcl] Rash   HPI 81 y/o female pt here with her son/ caregiver for RV   gerd- no symptoms currently. On prn prevacid at present  HTN- stable, on beincar  Constipation- stable with miralax  Hypothyroidism- taking her levothyroxine, recent tsh reviewed  Dm- cbg 102-164 with most of reading in 130-140, on metformin 1000 mg bid, has microalbuminuria  Diabetic neuropathy- on lyrica and neurontin   Review of Systems  Constitutional: PNegative for fever, chills and unexpected weight change.  HENT: Positive for hearing. Negative for ear pain.   Eyes: Wears corrective lenses  Respiratory: Dry cough for the last year.  Cardiovascular: negative for chest pain and palpitations Gastrointestinal: has consitpation Endocrine: History of diabetes  Genitourinary: Incontinence   Neurological: Negative for dizziness, tremors, seizures, syncope, speech difficulty and light-headedness. Numb in the feet and the lower legs  Hematological: Negative.   Psychiatric/Behavioral:  Memory loss and mild confusion.   Past Medical History  Diagnosis Date  . Diabetes type 2, uncontrolled   . Bronchitis, acute   . Anemia   . Gait disorder   . Hypertension   . Other disorder of calcium metabolism   . Myoclonus   . Hypothyroid   . Mild memory disturbance   . Hyperlipidemia   . Scoliosis   . Osteoporosis   . Eczema   . Hemoptysis   . Hyperpotassemia   . Unspecified hypertrophic and atrophic condition of skin   . Seborrheic keratosis   . Urinary tract infection, site not specified   . Hematuria,  unspecified   . Open wound of knee, leg (except thigh), and ankle, without mention of complication   . Personal history of fall   . Myoclonus   . Chronic kidney disease, unspecified   . Varicose veins of lower extremities with inflammation   . Trigger finger (acquired)   . Diastasis of muscle   . Type II or unspecified type diabetes mellitus without mention of complication, not stated as uncontrolled   . Chest pain, unspecified   . Closed fracture of lumbar vertebra without mention of spinal cord injury   . Personality change due to conditions classified elsewhere   . Closed dislocation, thoracic vertebra   . Carpal tunnel syndrome   . Other specified idiopathic peripheral neuropathy   . Unspecified constipation   . Unspecified glaucoma   . Macular degeneration (senile) of retina, unspecified   . Unspecified urinary incontinence   . Unspecified pruritic disorder   . Unspecified closed fracture of pelvis   . Unspecified vitamin D deficiency   . Other atopic dermatitis and related conditions   . Other disorder of calcium metabolism   . Diabetes with renal manifestations(250.4)   . Arthralgia of temporomandibular joint   . Insomnia, unspecified   . Obesity, unspecified   . Allergic rhinitis, cause unspecified   . Atrial fibrillation   . Unspecified hereditary and idiopathic peripheral neuropathy   . Arthropathy, unspecified, site unspecified   . History of nuclear stress test 06/06/2010    lexiscan; normal pattern of  perfusion; low risk    Current Outpatient Prescriptions on File Prior to Visit  Medication Sig Dispense Refill  . ALREX 0.2 % SUSP Place 1 drop into both eyes 2 (two) times daily.    . AMBULATORY NON FORMULARY MEDICATION Bayer Contour Test Strips and Lancets Sig: Use to test blood sugar twice daily. Dx: E11.65 100 each 11  . aspirin EC 81 MG tablet Take 1 tablet (81 mg total) by mouth daily. 30 tablet 0  . atorvastatin (LIPITOR) 20 MG tablet take 1 tablet by mouth  once daily TO LOWER CHOLESTEROL 90 tablet 1  . BENICAR 20 MG tablet TAKE 1/2 TABLET BY MOUTH ONCE A DAY FOR BLOOD PRESSURE 45 tablet 1  . Blood Glucose Monitoring Suppl (CONTOUR BLOOD GLUCOSE SYSTEM) DEVI Use to test blood sugar twice daily. Dx: E11.65 1 Device 0  . calcitonin, salmon, (MIACALCIN/FORTICAL) 200 UNIT/ACT nasal spray INSTILL 1 SPRAY INTO ALTERNATING NOSTRILS ONCE A DAY FOR OSTEOPROSIS 3.7 mL 5  . COMBIGAN 0.2-0.5 % ophthalmic solution One drop left eye twice daily    . dextromethorphan-guaiFENesin (MUCINEX DM) 30-600 MG per 12 hr tablet Take 1 tablet by mouth every 12 (twelve) hours.    . diclofenac sodium (VOLTAREN) 1 % GEL Apply topically. Apply twice a day to legs    . docusate sodium 100 MG CAPS Take 100 mg by mouth 2 (two) times daily. 10 capsule 0  . fluconazole (DIFLUCAN) 150 MG tablet Take 1 tablet (150 mg total) by mouth once a week. 3 tablet 0  . furosemide (LASIX) 20 MG tablet take 1 tablet by mouth once daily if needed for FLUID RETENTION 30 tablet 4  . gabapentin (NEURONTIN) 300 MG capsule Take two capsules by mouth at bedtime for pains 60 capsule 5  . latanoprost (XALATAN) 0.005 % ophthalmic solution Place 1 drop into both eyes at bedtime.     Marland Kitchen levothyroxine (SYNTHROID, LEVOTHROID) 75 MCG tablet Take one tablet by mouth once daily for thyroid supplement 30 tablet 5  . metFORMIN (GLUCOPHAGE) 1000 MG tablet TAKE 1 TABLET BY MOUTH TWICE A DAY TO CONTROL DIABETES 60 tablet 6  . Multiple Vitamins-Minerals (CENTRUM SILVER PO) Take 1 tablet by mouth daily.    . NON FORMULARY Take 1 capsule twice daily to help with Vision    . oxyCODONE (ROXICODONE) 5 MG immediate release tablet One every 6 hours if needed for severe pain 50 tablet 0  . polyethylene glycol (MIRALAX / GLYCOLAX) packet Take 17 g by mouth daily. 14 each 0  . pregabalin (LYRICA) 75 MG capsule Take one capsule by mouth twice daily to help with pains and nerves 60 capsule 5  . saccharomyces boulardii (FLORASTOR) 250  MG capsule Take 1 capsule (250 mg total) by mouth 2 (two) times daily. 20 capsule 0  . tobramycin-dexamethasone (TOBRADEX) ophthalmic solution Place 1 drop into the left eye as needed.     . traMADol (ULTRAM) 50 MG tablet TAKE 1 TABLET BY MOUTH EVERY 12 HOURS AS NEEDED FOR PAIN 60 tablet 5  . Trospium Chloride (SANCTURA XR) 60 MG CP24 Take one tablet by mouth daily to help bladder control 30 each 5  . vitamin C (ASCORBIC ACID) 250 MG tablet Take 250 mg by mouth daily.    Marland Kitchen zolpidem (AMBIEN) 5 MG tablet Take 5 mg by mouth at bedtime as needed for sleep.     No current facility-administered medications on file prior to visit.    Physical exam BP 118/64 mmHg  Pulse  75  Temp(Src) 97.3 F (36.3 C) (Oral)  Resp 12  Ht 5' (1.524 m)  Wt 157 lb (71.215 kg)  BMI 30.66 kg/m2  SpO2 97%  Constitutional: She is oriented to person, place, and time. No distress.Overweight.  HENT: Severe hearing loss in the right ear  Eyes: Conjunctivae and EOM are normal. Pupils are equal, round, and reactive to light.  Neck: Normal range of motion. Neck supple. No JVD present. No tracheal deviation present. No thyromegaly present.  Cardiovascular: Rate controlled irregularly irregular rhythm. No murmur. No gallop.  Pulmonary/Chest: Effort normal. No respiratory distress. She has no wheezes. Abdominal: She exhibits no distension and no mass. There is no tenderness.  Musculoskeletal: She exhibits trace edema. She exhibits no tenderness.  Lymphadenopathy: She has no cervical adenopathy.  Neurological: She is alert and oriented to person, place, and time. No cranial nerve deficit. Coordination normal. Mild memory loss.  Psychiatric: She has a normal mood and affect. Her behavior is normal. Thought content normal.   labs  Lipid Panel     Component Value Date/Time   CHOL 152 01/13/2014 0931   CHOL  11/08/2007 1901    143        ATP III CLASSIFICATION:  <200     mg/dL   Desirable  161-096  mg/dL   Borderline  High  >=045    mg/dL   High   TRIG 409* 81/19/1478 0931   HDL 35* 01/13/2014 0931   HDL 35* 11/08/2007 1901   CHOLHDL 4.3 01/13/2014 0931   CHOLHDL 4.1 11/08/2007 1901   VLDL 33 11/08/2007 1901   LDLCALC 73 01/13/2014 0931   LDLCALC  11/08/2007 1901    75        Total Cholesterol/HDL:CHD Risk Coronary Heart Disease Risk Table                     Men   Women  1/2 Average Risk   3.4   3.3   CMP Latest Ref Rng 04/21/2014 01/13/2014 09/12/2013  Glucose 65 - 99 mg/dL 295(A) 213(Y) 865(H)  BUN 10 - 36 mg/dL 33 84(O) 29  Creatinine 0.57 - 1.00 mg/dL 9.62(X) 5.28(U) 1.32(G)  Sodium 134 - 144 mmol/L 140 138 139  Potassium 3.5 - 5.2 mmol/L 4.6 5.7(H) 4.8  Chloride 97 - 108 mmol/L 100 99 100  CO2 18 - 29 mmol/L 22 22 21   Calcium 8.7 - 10.3 mg/dL 10.4(H) 10.5(H) 10.3  Total Protein 6.0 - 8.5 g/dL 6.4 6.7 6.7  Albumin 3.2 - 4.6 g/dL 3.9 3.9 4.2  Total Bilirubin 0.0 - 1.2 mg/dL <4.0 0.2 0.3  Alkaline Phos 39 - 117 IU/L 109 120(H) 113  AST 0 - 40 IU/L 12 13 12   ALT 0 - 32 IU/L 11 14 11    Lab Results  Component Value Date   HGBA1C 6.6* 04/21/2014   Lab Results  Component Value Date   TSH 2.610 04/21/2014    Assessment/plan  1. Essential hypertension Continue benicar for now. With her edema controlled and her impaired renal function, advised to take lasix only on as needed basis  2. Type 2 diabetes mellitus with diabetic chronic kidney disease a1c s/o controlled dm, continue metformin 1000 mg bid, monitor renal function, continue benicar and lipitor  3. Hypothyroidism, unspecified hypothyroidism type Controlled tsh, continue levothyroxine 75 mcg daily  4. Diabetic polyneuropathy associated with type 2 diabetes mellitus Continue lyrica and gabapentin current regimen  5. CKD stage 4 due to type 2 diabetes mellitus  Continue benicar, monitor renal function  6. Gastritis and gastroduodenitis Symptoms under control, d/c prevacid for now

## 2014-05-09 ENCOUNTER — Other Ambulatory Visit: Payer: Self-pay | Admitting: *Deleted

## 2014-05-09 MED ORDER — LEVOTHYROXINE SODIUM 75 MCG PO TABS: ORAL_TABLET | ORAL | Status: DC

## 2014-05-09 NOTE — Telephone Encounter (Signed)
[facility] 

## 2014-06-08 ENCOUNTER — Other Ambulatory Visit: Payer: Self-pay | Admitting: *Deleted

## 2014-06-08 MED ORDER — CALCITONIN (SALMON) 200 UNIT/ACT NA SOLN: NASAL | Status: DC

## 2014-06-08 NOTE — Telephone Encounter (Signed)
[facility] 

## 2014-06-28 ENCOUNTER — Other Ambulatory Visit: Payer: Self-pay | Admitting: Internal Medicine

## 2014-07-12 ENCOUNTER — Emergency Department (HOSPITAL_COMMUNITY): Payer: Medicare Other

## 2014-07-12 ENCOUNTER — Emergency Department (HOSPITAL_COMMUNITY)
Admission: EM | Admit: 2014-07-12 | Discharge: 2014-07-12 | Disposition: A | Discharge status: Home / Self Care | Payer: Medicare Other | Attending: Emergency Medicine | Admitting: Emergency Medicine

## 2014-07-12 ENCOUNTER — Telehealth: Payer: Self-pay | Admitting: *Deleted

## 2014-07-12 ENCOUNTER — Encounter (HOSPITAL_COMMUNITY): Payer: Self-pay | Admitting: Radiology

## 2014-07-12 DIAGNOSIS — Z7982 Long term (current) use of aspirin: Secondary | ICD-10-CM | POA: Insufficient documentation

## 2014-07-12 DIAGNOSIS — E1129 Type 2 diabetes mellitus with other diabetic kidney complication: Secondary | ICD-10-CM | POA: Insufficient documentation

## 2014-07-12 DIAGNOSIS — N390 Urinary tract infection, site not specified: Secondary | ICD-10-CM | POA: Diagnosis not present

## 2014-07-12 DIAGNOSIS — Z79899 Other long term (current) drug therapy: Secondary | ICD-10-CM | POA: Insufficient documentation

## 2014-07-12 DIAGNOSIS — Z9181 History of falling: Secondary | ICD-10-CM | POA: Insufficient documentation

## 2014-07-12 DIAGNOSIS — Z8781 Personal history of (healed) traumatic fracture: Secondary | ICD-10-CM | POA: Insufficient documentation

## 2014-07-12 DIAGNOSIS — R531 Weakness: Secondary | ICD-10-CM | POA: Diagnosis not present

## 2014-07-12 DIAGNOSIS — I129 Hypertensive chronic kidney disease with stage 1 through stage 4 chronic kidney disease, or unspecified chronic kidney disease: Secondary | ICD-10-CM | POA: Diagnosis not present

## 2014-07-12 DIAGNOSIS — E669 Obesity, unspecified: Secondary | ICD-10-CM | POA: Insufficient documentation

## 2014-07-12 DIAGNOSIS — H409 Unspecified glaucoma: Secondary | ICD-10-CM | POA: Diagnosis not present

## 2014-07-12 DIAGNOSIS — Z872 Personal history of diseases of the skin and subcutaneous tissue: Secondary | ICD-10-CM | POA: Diagnosis not present

## 2014-07-12 DIAGNOSIS — E039 Hypothyroidism, unspecified: Secondary | ICD-10-CM | POA: Diagnosis not present

## 2014-07-12 DIAGNOSIS — E785 Hyperlipidemia, unspecified: Secondary | ICD-10-CM | POA: Insufficient documentation

## 2014-07-12 DIAGNOSIS — Z791 Long term (current) use of non-steroidal anti-inflammatories (NSAID): Secondary | ICD-10-CM | POA: Insufficient documentation

## 2014-07-12 DIAGNOSIS — Z951 Presence of aortocoronary bypass graft: Secondary | ICD-10-CM | POA: Insufficient documentation

## 2014-07-12 DIAGNOSIS — Z87828 Personal history of other (healed) physical injury and trauma: Secondary | ICD-10-CM | POA: Insufficient documentation

## 2014-07-12 DIAGNOSIS — Z8719 Personal history of other diseases of the digestive system: Secondary | ICD-10-CM | POA: Insufficient documentation

## 2014-07-12 DIAGNOSIS — Z862 Personal history of diseases of the blood and blood-forming organs and certain disorders involving the immune mechanism: Secondary | ICD-10-CM | POA: Diagnosis not present

## 2014-07-12 DIAGNOSIS — R1011 Right upper quadrant pain: Secondary | ICD-10-CM | POA: Diagnosis present

## 2014-07-12 DIAGNOSIS — E875 Hyperkalemia: Secondary | ICD-10-CM

## 2014-07-12 DIAGNOSIS — N189 Chronic kidney disease, unspecified: Secondary | ICD-10-CM | POA: Insufficient documentation

## 2014-07-12 LAB — CBC WITH DIFFERENTIAL/PLATELET
Basophils Absolute: 0 10*3/uL (ref 0.0–0.1)
Basophils Relative: 0 % (ref 0–1)
Eosinophils Absolute: 0.2 10*3/uL (ref 0.0–0.7)
Eosinophils Relative: 3 % (ref 0–5)
HCT: 35.6 % — ABNORMAL LOW (ref 36.0–46.0)
HEMOGLOBIN: 11.3 g/dL — AB (ref 12.0–15.0)
LYMPHS ABS: 2.6 10*3/uL (ref 0.7–4.0)
LYMPHS PCT: 30 % (ref 12–46)
MCH: 30.2 pg (ref 26.0–34.0)
MCHC: 31.7 g/dL (ref 30.0–36.0)
MCV: 95.2 fL (ref 78.0–100.0)
MONOS PCT: 13 % — AB (ref 3–12)
Monocytes Absolute: 1.1 10*3/uL — ABNORMAL HIGH (ref 0.1–1.0)
Neutro Abs: 4.6 10*3/uL (ref 1.7–7.7)
Neutrophils Relative %: 54 % (ref 43–77)
Platelets: 281 10*3/uL (ref 150–400)
RBC: 3.74 MIL/uL — AB (ref 3.87–5.11)
RDW: 14.8 % (ref 11.5–15.5)
WBC: 8.5 10*3/uL (ref 4.0–10.5)

## 2014-07-12 LAB — COMPREHENSIVE METABOLIC PANEL
ALT: 19 U/L (ref 14–54)
ANION GAP: 14 (ref 5–15)
AST: 23 U/L (ref 15–41)
Albumin: 3.3 g/dL — ABNORMAL LOW (ref 3.5–5.0)
Alkaline Phosphatase: 135 U/L — ABNORMAL HIGH (ref 38–126)
BUN: UNDETERMINED mg/dL (ref 6–20)
CO2: 18 mmol/L — ABNORMAL LOW (ref 22–32)
Calcium: 9.8 mg/dL (ref 8.9–10.3)
Chloride: 105 mmol/L (ref 101–111)
Creatinine, Ser: UNDETERMINED mg/dL (ref 0.44–1.00)
GLUCOSE: 127 mg/dL — AB (ref 65–99)
Potassium: 5.7 mmol/L — ABNORMAL HIGH (ref 3.5–5.1)
Sodium: 137 mmol/L (ref 135–145)
Total Bilirubin: 0.6 mg/dL (ref 0.3–1.2)
Total Protein: 7 g/dL (ref 6.5–8.1)

## 2014-07-12 LAB — URINALYSIS, ROUTINE W REFLEX MICROSCOPIC
Bilirubin Urine: NEGATIVE
Glucose, UA: NEGATIVE mg/dL
Ketones, ur: NEGATIVE mg/dL
Nitrite: POSITIVE — AB
PROTEIN: 100 mg/dL — AB
SPECIFIC GRAVITY, URINE: 1.012 (ref 1.005–1.030)
Urobilinogen, UA: 0.2 mg/dL (ref 0.0–1.0)
pH: 6 (ref 5.0–8.0)

## 2014-07-12 LAB — CREATININE, SERUM
Creatinine, Ser: 1.74 mg/dL — ABNORMAL HIGH (ref 0.44–1.00)
GFR, EST AFRICAN AMERICAN: 28 mL/min — AB (ref >60)
GFR, EST NON AFRICAN AMERICAN: 24 mL/min — AB (ref >60)

## 2014-07-12 LAB — URINE MICROSCOPIC-ADD ON

## 2014-07-12 LAB — BUN: BUN: 30 mg/dL — AB (ref 6–20)

## 2014-07-12 LAB — LIPASE, BLOOD: Lipase: <10 U/L — ABNORMAL LOW (ref 22–51)

## 2014-07-12 MED ORDER — ONDANSETRON HCL 4 MG/2ML IJ SOLN
4.0000 mg | Freq: Once | INTRAMUSCULAR | Status: AC
  Administered 2014-07-12: 4 mg via INTRAVENOUS
  Filled 2014-07-12: qty 2

## 2014-07-12 MED ORDER — SODIUM CHLORIDE 0.9 % IV SOLN
1000.0000 mL | INTRAVENOUS | Status: DC
  Administered 2014-07-12: 1000 mL via INTRAVENOUS

## 2014-07-12 MED ORDER — CEPHALEXIN 500 MG PO CAPS: 500.0000 mg | ORAL_CAPSULE | Freq: Two times a day (BID) | ORAL | Status: AC

## 2014-07-12 MED ORDER — DEXTROSE 5 % IV SOLN
1.0000 g | Freq: Once | INTRAVENOUS | Status: AC
  Administered 2014-07-12: 1 g via INTRAVENOUS
  Filled 2014-07-12: qty 10

## 2014-07-12 NOTE — ED Provider Notes (Signed)
CSN: [id number]     Arrival date & time 07/12/14  1051 History   First MD Initiated Contact with Patient 07/12/14 1104     Chief Complaint  Patient presents with  . Weakness  . Abdominal Pain     (Consider location/radiation/quality/duration/timing/severity/associated sxs/prior Treatment) HPI Patient presents with her son who assists with the history of present illness. He states that over the past 3 or 4 days the patient has had increasing weakness, decline in functionality. During this time she has changed from being slightly ambulatory, capable of bearing weight, to nonambulatory entirely. She describes mild pain in the upper abdomen, more right than left. Chest was pain in the right wrist following a fall that occurred just prior to the onset of weakness. Falls mechanical, she recalls the entirety of the event. She saw her orthopedist at the fall due to wrist pain, has immobilization of the right wrist currently. Applications for this. No new medication, diet, activity. Patient is a notable history of prior GI bleed, hemoglobin far. Patient denies any change in stool color, frequency, urinary changes as well.  Past Medical History  Diagnosis Date  . Diabetes type 2, uncontrolled   . Bronchitis, acute   . Anemia   . Gait disorder   . Hypertension   . Other disorder of calcium metabolism   . Myoclonus   . Hypothyroid   . Mild memory disturbance   . Hyperlipidemia   . Scoliosis   . Osteoporosis   . Eczema   . Hemoptysis   . Hyperpotassemia   . Unspecified hypertrophic and atrophic condition of skin   . Seborrheic keratosis   . Urinary tract infection, site not specified   . Hematuria, unspecified   . Open wound of knee, leg (except thigh), and ankle, without mention of complication   . Personal history of fall   . Myoclonus   . Chronic kidney disease, unspecified   . Varicose veins of lower extremities with inflammation   . Trigger finger (acquired)   . Diastasis  of muscle   . Type II or unspecified type diabetes mellitus without mention of complication, not stated as uncontrolled   . Chest pain, unspecified   . Closed fracture of lumbar vertebra without mention of spinal cord injury   . Personality change due to conditions classified elsewhere   . Closed dislocation, thoracic vertebra   . Carpal tunnel syndrome   . Other specified idiopathic peripheral neuropathy   . Unspecified constipation   . Unspecified glaucoma   . Macular degeneration (senile) of retina, unspecified   . Unspecified urinary incontinence   . Unspecified pruritic disorder   . Unspecified closed fracture of pelvis   . Unspecified vitamin D deficiency   . Other atopic dermatitis and related conditions   . Other disorder of calcium metabolism   . Diabetes with renal manifestations(250.4)   . Arthralgia of temporomandibular joint   . Insomnia, unspecified   . Obesity, unspecified   . Allergic rhinitis, cause unspecified   . Atrial fibrillation   . Unspecified hereditary and idiopathic peripheral neuropathy   . Arthropathy, unspecified, site unspecified   . History of nuclear stress test 06/06/2010    lexiscan; normal pattern of perfusion; low risk    Past Surgical History  Procedure Laterality Date  . Abdominal hysterectomy      1973  . Coronary artery bypass graft      Dr Tyrone SageGerhardt 04/1996  . Biopsy breast      2000  .  Knee arthroscopy      left Dr Thurston Hole   . Lumbar spine surgery      Dr Newell Coral   . Laser laparoscopy      right eye 04/2005  . Right oophorectomy      1950  . Esophagogastroduodenoscopy N/A 08/17/2012    Procedure: ESOPHAGOGASTRODUODENOSCOPY (EGD);  Surgeon: Hilarie Fredrickson, MD;  Location: [facility];  Service: Endoscopy;  Laterality: N/A;  . Hot hemostasis N/A 08/17/2012    Procedure: HOT HEMOSTASIS (ARGON PLASMA COAGULATION/BICAP);  Surgeon: Hilarie Fredrickson, MD;  Location: [facility];  Service: Endoscopy;  Laterality: N/A;   Family History   Problem Relation Age of Onset  . Diabetes Mother   . Cancer Sister     colon  . Alzheimer's disease Sister    History  Substance Use Topics  . Smoking status: Never Smoker   . Smokeless tobacco: Never Used  . Alcohol Use: Yes     Comment: occasional wine   OB History    No data available     Review of Systems  Constitutional:       Per HPI, otherwise negative  HENT:       Per HPI, otherwise negative  Respiratory:       Per HPI, otherwise negative  Cardiovascular:       Per HPI, otherwise negative  Gastrointestinal: Negative for vomiting.  Endocrine:       Negative aside from HPI  Genitourinary:       Neg aside from HPI   Musculoskeletal:       Per HPI, otherwise negative  Skin: Negative.   Neurological: Positive for weakness. Negative for syncope.      Allergies  Noroxin; Sulfa antibiotics; and Prozac  Home Medications   Prior to Admission medications   Medication Sig Start Date End Date Taking? Authorizing Provider  AMBULATORY NON FORMULARY MEDICATION Bayer Contour Test Strips and Lancets Sig: Use to test blood sugar twice daily. Dx: E11.65 03/31/14  Yes Kimber Relic, MD  aspirin EC 81 MG tablet Take 1 tablet (81 mg total) by mouth daily. 10/10/12  Yes Meredeth Ide, MD  atorvastatin (LIPITOR) 20 MG tablet take 1 tablet by mouth once daily TO LOWER CHOLESTEROL Patient taking differently: TAKE 20 MG BY MOUTH ONCE DAILY TO LOWER CHOLESTEROL 01/10/14  Yes Kimber Relic, MD  BENICAR 20 MG tablet TAKE 1/2 TABLET BY MOUTH ONCE DAILY FOR BLOOD PRESSURE Patient taking differently: TAKE 10 MG BY MOUTH ONCE DAILY FOR BLOOD PRESSURE 06/28/14  Yes Kimber Relic, MD  Blood Glucose Monitoring Suppl (CONTOUR BLOOD GLUCOSE SYSTEM) DEVI Use to test blood sugar twice daily. Dx: E11.65 03/31/14  Yes Kimber Relic, MD  calcitonin, salmon, (MIACALCIN/FORTICAL) 200 UNIT/ACT nasal spray INSTILL 1 SPRAY INTO ALTERNATING NOSTRILS ONCE A DAY FOR OSTEOPROSIS 06/08/14  Yes Kimber Relic, MD   docusate sodium 100 MG CAPS Take 100 mg by mouth 2 (two) times daily. 10/10/12  Yes Meredeth Ide, MD  gabapentin (NEURONTIN) 300 MG capsule Take two capsules by mouth at bedtime for pains Patient taking differently: Take 600 mg by mouth at bedtime.  03/10/14  Yes Tiffany L Reed, DO  ALREX 0.2 % SUSP Place 1 drop into both eyes 2 (two) times daily. 05/25/13   Historical Provider, MD  COMBIGAN 0.2-0.5 % ophthalmic solution One drop left eye twice daily 08/06/12   Historical Provider, MD  dextromethorphan-guaiFENesin (MUCINEX DM) 30-600 MG per 12 hr tablet Take 1 tablet by  mouth every 12 (twelve) hours.    Historical Provider, MD  diclofenac sodium (VOLTAREN) 1 % GEL Apply topically. Apply twice a day to legs    Historical Provider, MD  fluconazole (DIFLUCAN) 150 MG tablet Take 1 tablet (150 mg total) by mouth once a week. 11/08/13   Oneal GroutMahima Pandey, MD  furosemide (LASIX) 20 MG tablet take 1 tablet by mouth once daily if needed for FLUID RETENTION Patient taking differently: TAKE 20 MG BY MOUTH ONCE DAILY IF NEEDED FOR FLUID RETENTION. 03/27/14   Sharon SellerJessica K Eubanks, NP  latanoprost (XALATAN) 0.005 % ophthalmic solution Place 1 drop into both eyes at bedtime.  11/17/12   Historical Provider, MD  levothyroxine (SYNTHROID, LEVOTHROID) 75 MCG tablet Take one tablet by mouth once daily for thyroid supplement Patient taking differently: Take 75 mcg by mouth daily before breakfast.  05/09/14   Kimber RelicArthur G Green, MD  metFORMIN (GLUCOPHAGE) 1000 MG tablet TAKE 1 TABLET BY MOUTH TWICE A DAY TO CONTROL DIABETES 02/07/14   Kimber RelicArthur G Green, MD  Multiple Vitamins-Minerals (CENTRUM SILVER PO) Take 1 tablet by mouth daily.    Historical Provider, MD  NON FORMULARY Take 1 capsule twice daily to help with Vision    Historical Provider, MD  oxyCODONE (ROXICODONE) 5 MG immediate release tablet One every 6 hours if needed for severe pain 01/17/14   Kimber RelicArthur G Green, MD  polyethylene glycol (MIRALAX / Ethelene HalGLYCOLAX) packet Take 17 g by mouth  daily. 10/10/12   Meredeth IdeGagan S Lama, MD  pregabalin (LYRICA) 75 MG capsule Take one capsule by mouth twice daily to help with pains and nerves 02/07/14   Kimber RelicArthur G Green, MD  saccharomyces boulardii (FLORASTOR) 250 MG capsule Take 1 capsule (250 mg total) by mouth 2 (two) times daily. 05/04/13   Tiffany L Reed, DO  tobramycin-dexamethasone [facility](TOBRADEX) ophthalmic solution Place 1 drop into the left eye as needed.  10/15/12   Historical Provider, MD  traMADol (ULTRAM) 50 MG tablet TAKE 1 TABLET BY MOUTH EVERY 12 HOURS AS NEEDED FOR PAIN 02/27/14   Tiffany L Reed, DO  Trospium Chloride (SANCTURA XR) 60 MG CP24 Take one tablet by mouth daily to help bladder control 01/10/14   Kimber RelicArthur G Green, MD  vitamin C (ASCORBIC ACID) 250 MG tablet Take 250 mg by mouth daily.    Historical Provider, MD  zolpidem (AMBIEN) 5 MG tablet Take 5 mg by mouth at bedtime as needed for sleep.    Historical Provider, MD   BP 139/59 mmHg  Pulse 77  Temp(Src) 97.8 F (36.6 C) (Oral)  Resp 20  SpO2 95% Physical Exam  Constitutional: She is oriented to person, place, and time. She appears well-developed and well-nourished. No distress.  Elderly female, awake, alert, interacting appropriately.  HENT:  Head: Normocephalic and atraumatic.  Eyes: Conjunctivae and EOM are normal.  Cardiovascular: Normal rate and regular rhythm.   Pulmonary/Chest: Effort normal and breath sounds normal. No stridor. No respiratory distress.  Abdominal: She exhibits no distension. There is tenderness in the right upper quadrant, epigastric area and left upper quadrant. There is no guarding.  Musculoskeletal: She exhibits no edema.  Neurological: She is alert and oriented to person, place, and time. No cranial nerve deficit.  Skin: Skin is warm and dry.  Psychiatric: She has a normal mood and affect.  Nursing note and vitals reviewed.   ED Course  Procedures (including critical care time) Labs Review Labs Reviewed  CBC WITH DIFFERENTIAL/PLATELET -  Abnormal; Notable for the following:  RBC 3.74 (*)    Hemoglobin 11.3 (*)    HCT 35.6 (*)    Monocytes Relative 13 (*)    Monocytes Absolute 1.1 (*)    All other components within normal limits  COMPREHENSIVE METABOLIC PANEL - Abnormal; Notable for the following:    Potassium 5.7 (*)    CO2 18 (*)    Glucose, Bld 127 (*)    Albumin 3.3 (*)    Alkaline Phosphatase 135 (*)    All other components within normal limits  LIPASE, BLOOD - Abnormal; Notable for the following:    Lipase <10 (*)    All other components within normal limits  URINALYSIS, ROUTINE W REFLEX MICROSCOPIC - Abnormal; Notable for the following:    APPearance TURBID (*)    Hgb urine dipstick MODERATE (*)    Protein, ur 100 (*)    Nitrite POSITIVE (*)    Leukocytes, UA LARGE (*)    All other components within normal limits  URINE MICROSCOPIC-ADD ON  BUN  CREATININE, SERUM    Imaging Review Ct Abdomen Pelvis Wo Contrast  07/12/2014   CLINICAL DATA:  Weakness, frequent urination, recent fall  EXAM: CT ABDOMEN AND PELVIS WITHOUT CONTRAST  TECHNIQUE: Multidetector CT imaging of the abdomen and pelvis was performed following the standard protocol without IV contrast.  COMPARISON:  08/16/2012  FINDINGS: Again noted diffuse osteopenia thoracolumbar spine. Prior vertebroplasty at T12, L1-L2 and L3 level. Stable moderate compression deformity of L4 vertebral body. Mild degenerative changes lower thoracic spine. The lung bases shows atelectasis or infiltrate in left base posterior medially.  Small hiatal hernia. Unenhanced liver shows no biliary ductal dilatation. There is a cyst in right hepatic lobe posteriorly measures 1.1 cm. Layering small calcified gallstones and sludge noted within gallbladder. No thickening of gallbladder wall. No intra or extrahepatic biliary ductal dilatation. Atherosclerotic calcifications of abdominal aorta and iliac arteries are noted.  Unenhanced pancreas is atrophic fatty replaced. Unenhanced spleen  and adrenal glands are unremarkable. Unenhanced kidneys are symmetrical in size. Bilateral renal cortical thinning. There is nonobstructive punctate calcification in midpole of the left kidney posteriorly measures 2 mm. Nonobstructive calcification in midpole of the right kidney measures 1.5 mm. No calcified ureteral calculi are noted bilaterally.  No small bowel obstruction. No aortic aneurysm. No ascites or free air. No adenopathy. Moderate stool noted in right colon and cecum. No pericecal inflammation. The terminal ileum is unremarkable.  The appendix is not identified.  Scattered diverticula are noted descending colon. Multiple sigmoid colon diverticula. There is significant thickening of urinary bladder wall. There is stranding of perivesical fat. Findings are highly suspicious for cystitis. The urinary bladder is under distended. Degenerative changes pubic symphysis. There is old fracture deformity of the right inferior pubic ramus. No distal colonic obstruction. Degenerative changes bilateral hips.  IMPRESSION: 1. There is significant thickening of urinary bladder wall. Urinary bladder is under distended. Stranding of perivesical fat. Findings are highly suspicious for significant cystitis. Clinical correlation is necessary. 2. Multiple sigmoid colon diverticula. Scattered diverticula are noted in descending colon. There is no evidence of acute diverticulitis. 3. No pericecal inflammation. The appendix is not identified. No small bowel obstruction. 4. Tiny nonobstructive calcifications bilateral kidney. This may be vascular in nature. No hydronephrosis or hydroureter. 5. Small layering calcified gallstones are noted within gallbladder. No pericholecystic fluid. No intra or extrahepatic biliary ductal dilatation. 6. Again noted diffuse osteopenia thoracolumbar spine. Prior vertebroplasty lumbar spine. Stable chronic compression deformity of L4 vertebral body.  Electronically Signed   By: Natasha Mead M.D.    On: 07/12/2014 13:45     EKG Interpretation   Date/Time:  Wednesday Jul 12 2014 11:00:38 EDT Ventricular Rate:  79 PR Interval:  51 QRS Duration: 127 QT Interval:  393 QTC Calculation: 450 R Axis:   63 Text Interpretation:  Sinus rhythm Short PR interval Right bundle branch  block Baseline wander in lead(s) V5 Sinus rhythm Non-specific  intra-ventricular conduction delay Artifact Abnormal ekg Confirmed by  Gerhard Munch  MD [phone]) on 07/12/2014 11:15:57 AM     2:54 PM Patient in no distress, states that she feels better. I discussed all findings with the patient and her son. There was a lab delay, and half of the chemistry panel was not processed initially. Should this return with unremarkable results, she is appropriate for discharge.  MDM  Patient presents with abdominal pain, nausea, fatigue. Here the patient is afebrile, hemodynamically stable, but does have a tender abdomen. CT scan largely reassuring aside from evidence for cystitis, consistent with patient's urinalysis per Patient started on antibiotics, discharged in stable condition. No evidence for acute bleed, bacteremia or sepsis. Patient has baseline renal dysfunction, unchanged. She also has mild hyperkalemia, but no evidence for with no EKG changes. Patient will follow-up with primary care for repeat chemistry panel, evaluation with her next 3 days.    Gerhard Munch, MD 07/12/14 [phone]

## 2014-07-12 NOTE — ED Notes (Signed)
From home cbg 187, son states weakness not normal, hx of gi bleed with hbg of 4, rebound tenderness when palpated RUQ, on asa,

## 2014-07-12 NOTE — ED Notes (Addendum)
BUN and Creatinine have to be redrawn, due to insufficient amount of blood.  MD, RN, and phlebotomy made aware.

## 2014-07-12 NOTE — Telephone Encounter (Signed)
Son called and stated that his mother was not doing well this morning, her symptoms : blurred vision, extreme thirst, frequent urination, sleepiness and weak. Her blood sugars are running 190-195 before breakfast. The patient did have a fall on Saturday, and had appointment with orthopedics on Monday , no fractures were found in her Rt/Lt hand or wrist. Patient was sent to hospital for a complete work-up as agreed by Dr. Lyn HollingsheadAlexander.

## 2014-07-12 NOTE — Discharge Instructions (Signed)
As discussed, it is important that you follow up with your physician for continued management of your condition.  Today's evaluation has resulted in a diagnosis of urinary tract infection and hyperkalemia, or high potassium.  When you see your physician, you need to have a repeat blood check to make sure that this has improved.  If you develop any new, or concerning changes in your condition, please return to the emergency department immediately.

## 2014-07-12 NOTE — ED Notes (Signed)
Witnessed in and out cath done by [employer] [name]

## 2014-07-12 NOTE — ED Notes (Signed)
Bed: [id number] Expected date:  Expected time:  Means of arrival:  Comments: 81 y/o weakness

## 2014-07-12 NOTE — ED Notes (Signed)
Patient transported to CT 

## 2014-07-12 NOTE — ED Notes (Signed)
Patient in CT I will collect labs when patient return.

## 2014-07-22 ENCOUNTER — Other Ambulatory Visit: Payer: Self-pay | Admitting: Internal Medicine

## 2014-08-05 ENCOUNTER — Other Ambulatory Visit: Payer: Self-pay | Admitting: Internal Medicine

## 2014-08-18 ENCOUNTER — Other Ambulatory Visit: Payer: Self-pay | Admitting: *Deleted

## 2014-08-18 ENCOUNTER — Other Ambulatory Visit: Payer: Self-pay | Admitting: Internal Medicine

## 2014-08-18 MED ORDER — LANSOPRAZOLE 30 MG PO CPDR: DELAYED_RELEASE_CAPSULE | ORAL | Status: DC

## 2014-08-18 NOTE — Telephone Encounter (Signed)
[facility] 

## 2014-08-23 ENCOUNTER — Telehealth: Payer: Self-pay | Admitting: *Deleted

## 2014-08-23 NOTE — Telephone Encounter (Signed)
Patient son called and wanted to know if patient should have labs done before her appointment. Reviewed patient's chart and she just had labs done at the hospital on 07/12/2014. Informed him to wait until they see Dr. Chilton SiGreen. He agreed.

## 2014-08-29 ENCOUNTER — Encounter: Payer: Self-pay | Admitting: Internal Medicine

## 2014-08-29 ENCOUNTER — Ambulatory Visit (INDEPENDENT_AMBULATORY_CARE_PROVIDER_SITE_OTHER): Payer: Medicare Other | Admitting: Internal Medicine

## 2014-08-29 VITALS — BP 126/62 | HR 88 | Temp 98.1°F | Resp 20 | Ht 60.0 in | Wt 164.0 lb

## 2014-08-29 DIAGNOSIS — E1121 Type 2 diabetes mellitus with diabetic nephropathy: Secondary | ICD-10-CM | POA: Diagnosis not present

## 2014-08-29 DIAGNOSIS — D649 Anemia, unspecified: Secondary | ICD-10-CM | POA: Diagnosis not present

## 2014-08-29 DIAGNOSIS — N189 Chronic kidney disease, unspecified: Secondary | ICD-10-CM

## 2014-08-29 DIAGNOSIS — I10 Essential (primary) hypertension: Secondary | ICD-10-CM

## 2014-08-29 DIAGNOSIS — R531 Weakness: Secondary | ICD-10-CM

## 2014-08-29 DIAGNOSIS — E1122 Type 2 diabetes mellitus with diabetic chronic kidney disease: Secondary | ICD-10-CM | POA: Diagnosis not present

## 2014-08-29 DIAGNOSIS — E039 Hypothyroidism, unspecified: Secondary | ICD-10-CM | POA: Diagnosis not present

## 2014-08-29 DIAGNOSIS — R8290 Unspecified abnormal findings in urine: Secondary | ICD-10-CM

## 2014-08-29 DIAGNOSIS — R8299 Other abnormal findings in urine: Secondary | ICD-10-CM | POA: Diagnosis not present

## 2014-08-29 DIAGNOSIS — K2990 Gastroduodenitis, unspecified, without bleeding: Secondary | ICD-10-CM | POA: Diagnosis not present

## 2014-08-29 DIAGNOSIS — N184 Chronic kidney disease, stage 4 (severe): Secondary | ICD-10-CM | POA: Diagnosis not present

## 2014-08-29 DIAGNOSIS — N390 Urinary tract infection, site not specified: Secondary | ICD-10-CM

## 2014-08-29 DIAGNOSIS — K2970 Gastritis, unspecified, without bleeding: Secondary | ICD-10-CM

## 2014-08-29 MED ORDER — CIPROFLOXACIN HCL 500 MG PO TABS: ORAL_TABLET | ORAL | Status: DC

## 2014-08-29 NOTE — Progress Notes (Signed)
Patient ID: [name], female   DOB: 1916-07-01, 81 y.o.   MRN: [id number]    Facility  [facility]    Place of Service:   OFFICE    Allergies  Allergen Reactions  . Noroxin [Norfloxacin] Swelling  . Sulfa Antibiotics Nausea And Vomiting  . Prozac [Fluoxetine Hcl] Rash    Chief Complaint  Patient presents with  . Medical Management of Chronic Issues    urine cloudy, more jerking seen , ? another UTI    HPI:   Cloudy urine: was seen in the [facility] ED on 07/12/14 diagnosed with and treated for a UTI, which has resolved. Son notes that over he last several days she has had cloudy urine again, gotten weaker - is typically able to transfer by pulling up with hands, he is now having to lift her under her arms for transfers, having increased 'jerking motions' of hands. No abdominal pain or dysuria, foul odor or chills.   CKD stage 4 due to type 2 diabetes mellitus: Stable  Diabetic nephropathy associated with type 2 diabetes mellitus - Plan: CMP  Gastritis and gastroduodenitis: Without complaint today  Hypothyroidism, unspecified hypothyroidism type: Stable  Essential hypertension: Well controlled  Weakness: Ongoing issue, worsened over last week, requiring greater assistance with transfers   Anemia, unspecified anemia type: No fatigue or pallor  Type 2 diabetes mellitus with diabetic chronic kidney disease: Am BSGs typically 140-150s. Have been as high as low 200s over last week    Medications: Patient's Medications  New Prescriptions   CIPROFLOXACIN (CIPRO) 500 MG TABLET    One twice daily to treat infection   CIPROFLOXACIN (CIPRO) 500 MG TABLET    One twice daily to treat infection  Previous Medications   ALREX 0.2 % SUSP    Place 1 drop into both eyes 2 (two) times daily.   ASPIRIN EC 81 MG TABLET    Take 1 tablet (81 mg total) by mouth daily.   ATORVASTATIN (LIPITOR) 20 MG TABLET    TAKE 1 TABLET BY MOUTH ONCE DAILY TO LOWER CHOLESTEROL   BENICAR 20 MG TABLET    TAKE 1/2  TABLET BY MOUTH ONCE DAILY FOR BLOOD PRESSURE   BLOOD GLUCOSE MONITORING SUPPL (CONTOUR BLOOD GLUCOSE SYSTEM) DEVI    Use to test blood sugar twice daily. Dx: E11.65   CALCITONIN, SALMON, (MIACALCIN/FORTICAL) 200 UNIT/ACT NASAL SPRAY    INSTILL 1 SPRAY INTO ALTERNATING NOSTRILS ONCE A DAY FOR OSTEOPROSIS   COMBIGAN 0.2-0.5 % OPHTHALMIC SOLUTION    Place 1 drop into the left eye 2 (two) times daily.    DEXTROMETHORPHAN-GUAIFENESIN (MUCINEX DM) 30-600 MG PER 12 HR TABLET    Take 1 tablet by mouth every 12 (twelve) hours as needed for cough.    DICLOFENAC SODIUM (VOLTAREN) 1 % GEL    Apply 2 g topically as needed. Apply twice a day to legs   DOCUSATE SODIUM 100 MG CAPS    Take 100 mg by mouth 2 (two) times daily.   FUROSEMIDE (LASIX) 20 MG TABLET    take 1 tablet by mouth once daily if needed for FLUID RETENTION   GABAPENTIN (NEURONTIN) 300 MG CAPSULE    Take two capsules by mouth at bedtime for pains   LANSOPRAZOLE (PREVACID) 30 MG CAPSULE    Take one capsule by mouth once daily for stomach   LATANOPROST (XALATAN) 0.005 % OPHTHALMIC SOLUTION    Place 1 drop into both eyes at bedtime.    LEVOTHYROXINE (SYNTHROID, LEVOTHROID) 75 MCG  TABLET    Take one tablet by mouth once daily for thyroid supplement   LYRICA 75 MG CAPSULE    TAKE 1 CAPSULE BY MOUTH 2 TIMES DAILY TO HELP WITH PAINS AND NERVES   METFORMIN (GLUCOPHAGE) 1000 MG TABLET    TAKE 1 TABLET BY MOUTH TWICE A DAY TO CONTROL DIABETES   MICROLET LANCETS MISC    by Other route.   MULTIPLE VITAMINS-MINERALS (CENTRUM SILVER PO)    Take 1 tablet by mouth daily.   NON FORMULARY    Take 1 capsule by mouth 2 (two) times daily. to help with Vision   ONE TOUCH ULTRA TEST TEST STRIP    by Other route.   OXYCODONE (ROXICODONE) 5 MG IMMEDIATE RELEASE TABLET    One every 6 hours if needed for severe pain   POLYETHYLENE GLYCOL (MIRALAX / GLYCOLAX) PACKET    Take 17 g by mouth daily.   SACCHAROMYCES BOULARDII (FLORASTOR) 250 MG CAPSULE    Take 1 capsule (250 mg  total) by mouth 2 (two) times daily.   TOBRAMYCIN-DEXAMETHASONE (TOBRADEX) OPHTHALMIC SOLUTION    Place 1 drop into the left eye daily as needed (eye irritation).    TRAMADOL (ULTRAM) 50 MG TABLET    TAKE 1 TABLET BY MOUTH EVERY 12 HOURS AS NEEDED FOR PAIN   TROSPIUM CHLORIDE 60 MG CP24    TAKE 1 CAPSULE BY MOUTH DAILY FOR BLADDER CONTROL   VITAMIN C (ASCORBIC ACID) 250 MG TABLET    Take 250 mg by mouth daily.   ZOLPIDEM (AMBIEN) 5 MG TABLET    Take 5 mg by mouth at bedtime as needed for sleep.  Modified Medications   No medications on file  Discontinued Medications   AMBULATORY NON FORMULARY MEDICATION    Bayer Contour Test Strips and Lancets Sig: Use to test blood sugar twice daily. Dx: E11.65   FLUCONAZOLE (DIFLUCAN) 150 MG TABLET    Take 1 tablet (150 mg total) by mouth once a week.     Review of Systems  Constitutional: Positive for activity change and fatigue. Negative for fever, chills and unexpected weight change.  HENT: Positive for hearing loss. Negative for ear pain.   Eyes: Negative.        Wears corrective lenses  Respiratory: Positive for cough.        Dry cough for the last year.  Cardiovascular: Positive for leg swelling.  Gastrointestinal: Negative.   Endocrine:       History of diabetes  Genitourinary:       Incontinence  Musculoskeletal: Positive for myalgias, back pain, arthralgias and gait problem.       Tender above ankles  Skin:       Bedsores since in hospital  Neurological: Positive for tremors (Choreiform jerking movements), weakness and numbness. Negative for dizziness, seizures, syncope, speech difficulty and light-headedness.       Numb in the feet and the lower legs  Hematological: Negative.   Psychiatric/Behavioral:       Memory loss and associated mild confusion.    Filed Vitals:   08/29/14 1322  BP: 126/62  Pulse: 88  Temp: 98.1 F (36.7 C)  TempSrc: Oral  Resp: 20  Height: 5' (1.524 m)  Weight: 164 lb (74.39 kg)  SpO2: 92%   Body  mass index is 32.03 kg/(m^2).  Physical Exam  Constitutional: She is oriented to person, place, and time. No distress.  Overweight.  HENT:  Severe hearing loss in the right ear  Eyes: Conjunctivae  and EOM are normal. Pupils are equal, round, and reactive to light.  Neck: Normal range of motion. Neck supple. No JVD present. No tracheal deviation present. No thyromegaly present.  Cardiovascular:  Rate controlled irregularly irregular rhythm. No murmur. No gallop.  Pulmonary/Chest: Effort normal. No respiratory distress. She has no wheezes. She has no rales. She exhibits no tenderness.  Abdominal: She exhibits no distension and no mass. There is no tenderness.  Musculoskeletal: She exhibits edema. She exhibits no tenderness.  Bilateral knee tenderness and crepitance. Mild back discomfort. Tender above ankles.  Lymphadenopathy:    She has no cervical adenopathy.  Neurological: She is alert and oriented to person, place, and time. No cranial nerve deficit. Coordination normal.  Mild memory loss. Choreiform jerking movements of the upper extremities.  Skin: No rash noted. She is not diaphoretic. No erythema. No pallor.  Healed decubitus of buttocks near intergluteal fold  Psychiatric: She has a normal mood and affect. Her behavior is normal. Thought content normal.     Labs reviewed: Admission on 07/12/2014, Discharged on 07/12/2014  Component Date Value Ref Range Status  . WBC 07/12/2014 8.5  4.0 - 10.5 K/uL Final  . RBC 07/12/2014 3.74* 3.87 - 5.11 MIL/uL Final  . Hemoglobin 07/12/2014 11.3* 12.0 - 15.0 g/dL Final  . HCT 07/12/2014 35.6* 36.0 - 46.0 % Final  . MCV 07/12/2014 95.2  78.0 - 100.0 fL Final  . MCH 07/12/2014 30.2  26.0 - 34.0 pg Final  . MCHC 07/12/2014 31.7  30.0 - 36.0 g/dL Final  . RDW 07/12/2014 14.8  11.5 - 15.5 % Final  . Platelets 07/12/2014 281  150 - 400 K/uL Final  . Neutrophils Relative % 07/12/2014 54  43 - 77 % Final  . Neutro Abs 07/12/2014 4.6  1.7 - 7.7  K/uL Final  . Lymphocytes Relative 07/12/2014 30  12 - 46 % Final  . Lymphs Abs 07/12/2014 2.6  0.7 - 4.0 K/uL Final  . Monocytes Relative 07/12/2014 13* 3 - 12 % Final  . Monocytes Absolute 07/12/2014 1.1* 0.1 - 1.0 K/uL Final  . Eosinophils Relative 07/12/2014 3  0 - 5 % Final  . Eosinophils Absolute 07/12/2014 0.2  0.0 - 0.7 K/uL Final  . Basophils Relative 07/12/2014 0  0 - 1 % Final  . Basophils Absolute 07/12/2014 0.0  0.0 - 0.1 K/uL Final  . Sodium 07/12/2014 137  135 - 145 mmol/L Final  . Potassium 07/12/2014 5.7* 3.5 - 5.1 mmol/L Final   NO VISIBLE HEMOLYSIS  . Chloride 07/12/2014 105  101 - 111 mmol/L Final  . CO2 07/12/2014 18* 22 - 32 mmol/L Final  . Glucose, Bld 07/12/2014 127* 65 - 99 mg/dL Final  . BUN 07/12/2014 QUANTITY NOT SUFFICIENT, UNABLE TO PERFORM TEST  6 - 20 mg/dL Final   INFORMED NEASE,A. RN AT 5456 07/12/14 MULLINS,T  . Creatinine, Ser 07/12/2014 QUANTITY NOT SUFFICIENT, UNABLE TO PERFORM TEST  0.44 - 1.00 mg/dL Final   INFORMED NEASE,A. RN AT 2563 07/12/14 MULLINS,T  . Calcium 07/12/2014 9.8  8.9 - 10.3 mg/dL Final  . Total Protein 07/12/2014 7.0  6.5 - 8.1 g/dL Final  . Albumin 07/12/2014 3.3* 3.5 - 5.0 g/dL Final  . AST 07/12/2014 23  15 - 41 U/L Final  . ALT 07/12/2014 19  14 - 54 U/L Final  . Alkaline Phosphatase 07/12/2014 135* 38 - 126 U/L Final  . Total Bilirubin 07/12/2014 0.6  0.3 - 1.2 mg/dL Final  . GFR calc non  Af Amer 07/12/2014 NOT CALCULATED  >60 mL/min Final  . GFR calc Af Amer 07/12/2014 NOT CALCULATED  >60 mL/min Final   Comment: (NOTE) The eGFR has been calculated using the CKD EPI equation. This calculation has not been validated in all clinical situations. eGFR's persistently <60 mL/min signify possible Chronic Kidney Disease.   . Anion gap 07/12/2014 14  5 - 15 Final  . Lipase 07/12/2014 <10* 22 - 51 U/L Final   QUANTITY NOT SUFFICIENT TO REPEAT TEST  . Color, Urine 07/12/2014 YELLOW  YELLOW Final  . APPearance 07/12/2014 TURBID*  CLEAR Final  . Specific Gravity, Urine 07/12/2014 1.012  1.005 - 1.030 Final  . pH 07/12/2014 6.0  5.0 - 8.0 Final  . Glucose, UA 07/12/2014 NEGATIVE  NEGATIVE mg/dL Final  . Hgb urine dipstick 07/12/2014 MODERATE* NEGATIVE Final  . Bilirubin Urine 07/12/2014 NEGATIVE  NEGATIVE Final  . Ketones, ur 07/12/2014 NEGATIVE  NEGATIVE mg/dL Final  . Protein, ur 07/12/2014 100* NEGATIVE mg/dL Final  . Urobilinogen, UA 07/12/2014 0.2  0.0 - 1.0 mg/dL Final  . Nitrite 07/12/2014 POSITIVE* NEGATIVE Final  . Leukocytes, UA 07/12/2014 LARGE* NEGATIVE Final  . BUN 07/12/2014 30* 6 - 20 mg/dL Final  . Creatinine, Ser 07/12/2014 1.74* 0.44 - 1.00 mg/dL Final  . GFR calc non Af Amer 07/12/2014 24* >60 mL/min Final  . GFR calc Af Amer 07/12/2014 28* >60 mL/min Final   Comment: (NOTE) The eGFR has been calculated using the CKD EPI equation. This calculation has not been validated in all clinical situations. eGFR's persistently <60 mL/min signify possible Chronic Kidney Disease.   Marland Kitchen Urine-Other 07/12/2014 FIELD OBSCURED BY WBC'S   Final     Assessment/Plan 1. Cloudy urine - Urine culture - Urinalysis - Cipro 500 mg twice daily 10 days  2. CKD stage 4 due to type 2 diabetes mellitus - CMP  3. Diabetic nephropathy associated with type 2 diabetes mellitus - CMP  4. Gastritis and gastroduodenitis Stable  5. Hypothyroidism, unspecified hypothyroidism type - TSH  6. Essential hypertension Controlled  7. Weakness Ongoing  8. UTI (lower urinary tract infection) - Urine culture - Urinalysis - ciprofloxacin (CIPRO) 500 MG tablet; One twice daily to treat infection  Dispense: 20 tablet; Refill: 0  9. Anemia, unspecified anemia type -CBC  10. Type 2 diabetes mellitus with diabetic chronic kidney disease - CMP - Hemoglobin A1c

## 2014-08-29 NOTE — Progress Notes (Deleted)
Patient ID: [name], female   DOB: 06/23/1916, 81 y.o.   MRN: [id number]    Facility  [facility]    Place of Service:   OFFICE    Allergies  Allergen Reactions  . Noroxin [Norfloxacin] Swelling  . Sulfa Antibiotics Nausea And Vomiting  . Prozac [Fluoxetine Hcl] Rash    Chief Complaint  Patient presents with  . Medical Management of Chronic Issues    urine cloudy, more jerking seen , ? another UTI    HPI:  ***  Medications: Patient's Medications  New Prescriptions   No medications on file  Previous Medications   ALREX 0.2 % SUSP    Place 1 drop into both eyes 2 (two) times daily.   ASPIRIN EC 81 MG TABLET    Take 1 tablet (81 mg total) by mouth daily.   ATORVASTATIN (LIPITOR) 20 MG TABLET    TAKE 1 TABLET BY MOUTH ONCE DAILY TO LOWER CHOLESTEROL   BENICAR 20 MG TABLET    TAKE 1/2 TABLET BY MOUTH ONCE DAILY FOR BLOOD PRESSURE   BLOOD GLUCOSE MONITORING SUPPL (CONTOUR BLOOD GLUCOSE SYSTEM) DEVI    Use to test blood sugar twice daily. Dx: E11.65   CALCITONIN, SALMON, (MIACALCIN/FORTICAL) 200 UNIT/ACT NASAL SPRAY    INSTILL 1 SPRAY INTO ALTERNATING NOSTRILS ONCE A DAY FOR OSTEOPROSIS   COMBIGAN 0.2-0.5 % OPHTHALMIC SOLUTION    Place 1 drop into the left eye 2 (two) times daily.    DEXTROMETHORPHAN-GUAIFENESIN (MUCINEX DM) 30-600 MG PER 12 HR TABLET    Take 1 tablet by mouth every 12 (twelve) hours as needed for cough.    DICLOFENAC SODIUM (VOLTAREN) 1 % GEL    Apply 2 g topically as needed. Apply twice a day to legs   DOCUSATE SODIUM 100 MG CAPS    Take 100 mg by mouth 2 (two) times daily.   FUROSEMIDE (LASIX) 20 MG TABLET    take 1 tablet by mouth once daily if needed for FLUID RETENTION   GABAPENTIN (NEURONTIN) 300 MG CAPSULE    Take two capsules by mouth at bedtime for pains   LANSOPRAZOLE (PREVACID) 30 MG CAPSULE    Take one capsule by mouth once daily for stomach   LATANOPROST (XALATAN) 0.005 % OPHTHALMIC SOLUTION    Place 1 drop into both eyes at bedtime.    LEVOTHYROXINE  (SYNTHROID, LEVOTHROID) 75 MCG TABLET    Take one tablet by mouth once daily for thyroid supplement   LYRICA 75 MG CAPSULE    TAKE 1 CAPSULE BY MOUTH 2 TIMES DAILY TO HELP WITH PAINS AND NERVES   METFORMIN (GLUCOPHAGE) 1000 MG TABLET    TAKE 1 TABLET BY MOUTH TWICE A DAY TO CONTROL DIABETES   MICROLET LANCETS MISC    by Other route.   MULTIPLE VITAMINS-MINERALS (CENTRUM SILVER PO)    Take 1 tablet by mouth daily.   NON FORMULARY    Take 1 capsule by mouth 2 (two) times daily. to help with Vision   ONE TOUCH ULTRA TEST TEST STRIP    by Other route.   OXYCODONE (ROXICODONE) 5 MG IMMEDIATE RELEASE TABLET    One every 6 hours if needed for severe pain   POLYETHYLENE GLYCOL (MIRALAX / GLYCOLAX) PACKET    Take 17 g by mouth daily.   SACCHAROMYCES BOULARDII (FLORASTOR) 250 MG CAPSULE    Take 1 capsule (250 mg total) by mouth 2 (two) times daily.   TOBRAMYCIN-DEXAMETHASONE (TOBRADEX) OPHTHALMIC SOLUTION    Place 1  drop into the left eye daily as needed (eye irritation).    TRAMADOL (ULTRAM) 50 MG TABLET    TAKE 1 TABLET BY MOUTH EVERY 12 HOURS AS NEEDED FOR PAIN   TROSPIUM CHLORIDE 60 MG CP24    TAKE 1 CAPSULE BY MOUTH DAILY FOR BLADDER CONTROL   VITAMIN C (ASCORBIC ACID) 250 MG TABLET    Take 250 mg by mouth daily.   ZOLPIDEM (AMBIEN) 5 MG TABLET    Take 5 mg by mouth at bedtime as needed for sleep.  Modified Medications   No medications on file  Discontinued Medications   AMBULATORY NON FORMULARY MEDICATION    Bayer Contour Test Strips and Lancets Sig: Use to test blood sugar twice daily. Dx: E11.65   FLUCONAZOLE (DIFLUCAN) 150 MG TABLET    Take 1 tablet (150 mg total) by mouth once a week.     Review of Systems  Filed Vitals:   08/29/14 1322  BP: 126/62  Pulse: 88  Temp: 98.1 F (36.7 C)  TempSrc: Oral  Resp: 20  Height: 5' (1.524 m)  Weight: 164 lb (74.39 kg)  SpO2: 92%   Body mass index is 32.03 kg/(m^2).  Physical Exam   Labs reviewed: Admission on 07/12/2014, Discharged on  07/12/2014  Component Date Value Ref Range Status  . WBC 07/12/2014 8.5  4.0 - 10.5 K/uL Final  . RBC 07/12/2014 3.74* 3.87 - 5.11 MIL/uL Final  . Hemoglobin 07/12/2014 11.3* 12.0 - 15.0 g/dL Final  . HCT 39/19/7376 35.6* 36.0 - 46.0 % Final  . MCV 07/12/2014 95.2  78.0 - 100.0 fL Final  . MCH 07/12/2014 30.2  26.0 - 34.0 pg Final  . MCHC 07/12/2014 31.7  30.0 - 36.0 g/dL Final  . RDW 22/33/1742 14.8  11.5 - 15.5 % Final  . Platelets 07/12/2014 281  150 - 400 K/uL Final  . Neutrophils Relative % 07/12/2014 54  43 - 77 % Final  . Neutro Abs 07/12/2014 4.6  1.7 - 7.7 K/uL Final  . Lymphocytes Relative 07/12/2014 30  12 - 46 % Final  . Lymphs Abs 07/12/2014 2.6  0.7 - 4.0 K/uL Final  . Monocytes Relative 07/12/2014 13* 3 - 12 % Final  . Monocytes Absolute 07/12/2014 1.1* 0.1 - 1.0 K/uL Final  . Eosinophils Relative 07/12/2014 3  0 - 5 % Final  . Eosinophils Absolute 07/12/2014 0.2  0.0 - 0.7 K/uL Final  . Basophils Relative 07/12/2014 0  0 - 1 % Final  . Basophils Absolute 07/12/2014 0.0  0.0 - 0.1 K/uL Final  . Sodium 07/12/2014 137  135 - 145 mmol/L Final  . Potassium 07/12/2014 5.7* 3.5 - 5.1 mmol/L Final   NO VISIBLE HEMOLYSIS  . Chloride 07/12/2014 105  101 - 111 mmol/L Final  . CO2 07/12/2014 18* 22 - 32 mmol/L Final  . Glucose, Bld 07/12/2014 127* 65 - 99 mg/dL Final  . BUN 13/21/3943 QUANTITY NOT SUFFICIENT, UNABLE TO PERFORM TEST  6 - 20 mg/dL Final   INFORMED NEASE,A. RN AT 1305 07/12/14 MULLINS,T  . Creatinine, Ser 07/12/2014 QUANTITY NOT SUFFICIENT, UNABLE TO PERFORM TEST  0.44 - 1.00 mg/dL Final   INFORMED NEASE,A. RN AT 1305 07/12/14 MULLINS,T  . Calcium 07/12/2014 9.8  8.9 - 10.3 mg/dL Final  . Total Protein 07/12/2014 7.0  6.5 - 8.1 g/dL Final  . Albumin 84/80/8531 3.3* 3.5 - 5.0 g/dL Final  . AST 83/30/8357 23  15 - 41 U/L Final  . ALT 07/12/2014 19  14 - 54 U/L Final  . Alkaline Phosphatase 07/12/2014 135* 38 - 126 U/L Final  . Total Bilirubin 07/12/2014 0.6  0.3 -  1.2 mg/dL Final  . GFR calc non Af Amer 07/12/2014 NOT CALCULATED  >60 mL/min Final  . GFR calc Af Amer 07/12/2014 NOT CALCULATED  >60 mL/min Final   Comment: (NOTE) The eGFR has been calculated using the CKD EPI equation. This calculation has not been validated in all clinical situations. eGFR's persistently <60 mL/min signify possible Chronic Kidney Disease.   . Anion gap 07/12/2014 14  5 - 15 Final  . Lipase 07/12/2014 <10* 22 - 51 U/L Final   QUANTITY NOT SUFFICIENT TO REPEAT TEST  . Color, Urine 07/12/2014 YELLOW  YELLOW Final  . APPearance 07/12/2014 TURBID* CLEAR Final  . Specific Gravity, Urine 07/12/2014 1.012  1.005 - 1.030 Final  . pH 07/12/2014 6.0  5.0 - 8.0 Final  . Glucose, UA 07/12/2014 NEGATIVE  NEGATIVE mg/dL Final  . Hgb urine dipstick 07/12/2014 MODERATE* NEGATIVE Final  . Bilirubin Urine 07/12/2014 NEGATIVE  NEGATIVE Final  . Ketones, ur 07/12/2014 NEGATIVE  NEGATIVE mg/dL Final  . Protein, ur 07/12/2014 100* NEGATIVE mg/dL Final  . Urobilinogen, UA 07/12/2014 0.2  0.0 - 1.0 mg/dL Final  . Nitrite 07/12/2014 POSITIVE* NEGATIVE Final  . Leukocytes, UA 07/12/2014 LARGE* NEGATIVE Final  . BUN 07/12/2014 30* 6 - 20 mg/dL Final  . Creatinine, Ser 07/12/2014 1.74* 0.44 - 1.00 mg/dL Final  . GFR calc non Af Amer 07/12/2014 24* >60 mL/min Final  . GFR calc Af Amer 07/12/2014 28* >60 mL/min Final   Comment: (NOTE) The eGFR has been calculated using the CKD EPI equation. This calculation has not been validated in all clinical situations. eGFR's persistently <60 mL/min signify possible Chronic Kidney Disease.   Marland Kitchen Urine-Other 07/12/2014 FIELD OBSCURED BY WBC'S   Final     Assessment/Plan  1. Cloudy urine ***  2. CKD stage 4 due to type 2 diabetes mellitus *** - CMP  3. Diabetic nephropathy associated with type 2 diabetes mellitus *** - CMP  4. Gastritis and gastroduodenitis ***  5. Hypothyroidism, unspecified hypothyroidism type *** - TSH  6.  Essential hypertension ***  7. Weakness ***  8. UTI (lower urinary tract infection) *** - Urine culture - Urinalysis  9. Anemia, unspecified anemia type ***  10. Type 2 diabetes mellitus with diabetic chronic kidney disease *** - CMP - Hemoglobin A1c

## 2014-08-30 LAB — COMPREHENSIVE METABOLIC PANEL
ALT: 12 IU/L (ref 0–32)
AST: 12 IU/L (ref 0–40)
Albumin/Globulin Ratio: 1.6 (ref 1.1–2.5)
Albumin: 3.9 g/dL (ref 3.2–4.6)
Alkaline Phosphatase: 130 IU/L — ABNORMAL HIGH (ref 39–117)
BUN/Creatinine Ratio: 14 (ref 11–26)
BUN: 34 mg/dL (ref 10–36)
Bilirubin Total: 0.2 mg/dL (ref 0.0–1.2)
CALCIUM: 9.6 mg/dL (ref 8.7–10.3)
CO2: 21 mmol/L (ref 18–29)
CREATININE: 2.47 mg/dL — AB (ref 0.57–1.00)
Chloride: 100 mmol/L (ref 97–108)
GFR calc Af Amer: 19 mL/min/{1.73_m2} — ABNORMAL LOW (ref >59)
GFR calc non Af Amer: 16 mL/min/{1.73_m2} — ABNORMAL LOW (ref >59)
GLOBULIN, TOTAL: 2.5 g/dL (ref 1.5–4.5)
GLUCOSE: 131 mg/dL — AB (ref 65–99)
POTASSIUM: 5.3 mmol/L — AB (ref 3.5–5.2)
SODIUM: 141 mmol/L (ref 134–144)
TOTAL PROTEIN: 6.4 g/dL (ref 6.0–8.5)

## 2014-08-30 LAB — HEMOGLOBIN A1C
Est. average glucose Bld gHb Est-mCnc: 148 mg/dL
Hgb A1c MFr Bld: 6.8 % — ABNORMAL HIGH (ref 4.8–5.6)

## 2014-08-30 LAB — URINALYSIS
Bilirubin, UA: NEGATIVE
Glucose, UA: NEGATIVE
Ketones, UA: NEGATIVE
NITRITE UA: NEGATIVE
SPEC GRAV UA: 1.012 (ref 1.005–1.030)
Urobilinogen, Ur: 0.2 mg/dL (ref 0.2–1.0)
pH, UA: 6 (ref 5.0–7.5)

## 2014-08-30 LAB — TSH: TSH: 3.11 u[IU]/mL (ref 0.450–4.500)

## 2014-08-31 LAB — URINE CULTURE

## 2014-09-02 ENCOUNTER — Other Ambulatory Visit: Payer: Self-pay | Admitting: Internal Medicine

## 2014-09-04 ENCOUNTER — Other Ambulatory Visit: Payer: Self-pay | Admitting: *Deleted

## 2014-09-04 MED ORDER — GABAPENTIN 300 MG PO CAPS: ORAL_CAPSULE | ORAL | Status: DC

## 2014-09-04 NOTE — Telephone Encounter (Signed)
[facility] 

## 2014-09-26 ENCOUNTER — Other Ambulatory Visit: Payer: Self-pay | Admitting: *Deleted

## 2014-09-26 MED ORDER — PREGABALIN 75 MG PO CAPS: ORAL_CAPSULE | ORAL | Status: DC

## 2014-09-26 NOTE — Telephone Encounter (Signed)
[facility] 

## 2014-10-09 ENCOUNTER — Ambulatory Visit (INDEPENDENT_AMBULATORY_CARE_PROVIDER_SITE_OTHER): Payer: Medicare Other | Admitting: Internal Medicine

## 2014-10-09 VITALS — BP 108/56 | HR 74 | Temp 97.5°F | Resp 20 | Wt 162.4 lb

## 2014-10-09 DIAGNOSIS — R300 Dysuria: Secondary | ICD-10-CM | POA: Diagnosis not present

## 2014-10-09 LAB — POCT URINALYSIS DIPSTICK
Bilirubin, UA: NEGATIVE
Glucose, UA: NEGATIVE
Ketones, UA: NEGATIVE
Nitrite, UA: NEGATIVE
Protein, UA: 300
Spec Grav, UA: 1.02
Urobilinogen, UA: 0.2
pH, UA: 6

## 2014-10-09 MED ORDER — CEPHALEXIN 500 MG PO CAPS: ORAL_CAPSULE | ORAL | Status: DC

## 2014-10-09 NOTE — Progress Notes (Signed)
Called and spoke with Dr. Chilton Si concerning the patient coming in to the office for pain during urination, patient's had large amount of blood and leukocytes. Dr. Chilton Si ordered Keflex 500 mg - 1 capsule TID for UTI with no refills, 21 capsules given.

## 2014-10-10 ENCOUNTER — Other Ambulatory Visit: Payer: Self-pay | Admitting: Internal Medicine

## 2014-10-10 LAB — URINE CULTURE

## 2014-11-03 ENCOUNTER — Other Ambulatory Visit: Payer: Self-pay | Admitting: Internal Medicine

## 2014-11-29 ENCOUNTER — Ambulatory Visit (INDEPENDENT_AMBULATORY_CARE_PROVIDER_SITE_OTHER): Payer: Medicare Other | Admitting: Internal Medicine

## 2014-11-29 ENCOUNTER — Encounter: Payer: Self-pay | Admitting: Internal Medicine

## 2014-11-29 VITALS — BP 124/76 | HR 76 | Temp 98.1°F | Resp 16 | Ht 60.0 in | Wt 160.0 lb

## 2014-11-29 DIAGNOSIS — E875 Hyperkalemia: Secondary | ICD-10-CM | POA: Diagnosis not present

## 2014-11-29 DIAGNOSIS — S9032XA Contusion of left foot, initial encounter: Secondary | ICD-10-CM | POA: Diagnosis not present

## 2014-11-29 DIAGNOSIS — E039 Hypothyroidism, unspecified: Secondary | ICD-10-CM | POA: Diagnosis not present

## 2014-11-29 DIAGNOSIS — I10 Essential (primary) hypertension: Secondary | ICD-10-CM | POA: Diagnosis not present

## 2014-11-29 DIAGNOSIS — N183 Chronic kidney disease, stage 3 (moderate): Secondary | ICD-10-CM

## 2014-11-29 DIAGNOSIS — E1122 Type 2 diabetes mellitus with diabetic chronic kidney disease: Secondary | ICD-10-CM

## 2014-11-29 MED ORDER — OXYCODONE HCL 5 MG PO TABS: ORAL_TABLET | ORAL | Status: DC

## 2014-11-29 NOTE — Progress Notes (Signed)
Patient ID: [name], female   DOB: 1916/07/20, 81 y.o.   MRN: [id number]    Facility  [facility]    Place of Service:   OFFICE    Allergies  Allergen Reactions  . Noroxin [Norfloxacin] Swelling  . Sulfa Antibiotics Nausea And Vomiting  . Prozac [Fluoxetine Hcl] Rash    Chief Complaint  Patient presents with  . Medical Management of Chronic Issues    3 month follow-up, frequent UTI's (no symptoms today)  . Immunizations    Flu vaccine given at [facility]   . Medication Refill    Renew Oxycodone     HPI:  Contusion of foot, left, initial encounter - continues with discomfort. Benefits from oxyCODONE (ROXICODONE) 5 MG immediate release tablet  Type 2 diabetes mellitus with stage 3 chronic kidney disease, without long-term current use of insulin (HCC) - reasonably well controlled with home values being 130-170 mg percent   Essential hypertension - controlled   Hypothyroidism, unspecified hypothyroidism type - controlled  Hyperpotassemia - needs follow up Comprehensive metabolic panel    Medications: Patient's Medications  New Prescriptions   No medications on file  Previous Medications   ALREX 0.2 % SUSP    Place 1 drop into both eyes 2 (two) times daily.   ASPIRIN EC 81 MG TABLET    Take 1 tablet (81 mg total) by mouth daily.   ATORVASTATIN (LIPITOR) 20 MG TABLET    TAKE 1 TABLET BY MOUTH ONCE DAILY TO LOWER CHOLESTEROL   BENICAR 20 MG TABLET    TAKE 1/2 TABLET BY MOUTH ONCE DAILY FOR BLOOD PRESSURE   BLOOD GLUCOSE MONITORING SUPPL (CONTOUR BLOOD GLUCOSE SYSTEM) DEVI    Use to test blood sugar twice daily. Dx: E11.65   CALCITONIN, SALMON, (MIACALCIN/FORTICAL) 200 UNIT/ACT NASAL SPRAY    INSTILL 1 SPRAY INTO ALTERNATING NOSTRILS ONCE A DAY FOR OSTEOPROSIS   COMBIGAN 0.2-0.5 % OPHTHALMIC SOLUTION    Place 1 drop into the left eye 2 (two) times daily.    DEXTROMETHORPHAN-GUAIFENESIN (MUCINEX DM) 30-600 MG PER 12 HR TABLET    Take 1 tablet by mouth every 12 (twelve) hours as  needed for cough.    DICLOFENAC SODIUM (VOLTAREN) 1 % GEL    Apply 2 g topically as needed. Apply twice a day to legs   DOCUSATE SODIUM 100 MG CAPS    Take 100 mg by mouth 2 (two) times daily.   FUROSEMIDE (LASIX) 20 MG TABLET    take 1 tablet by mouth once daily if needed for FLUID RETENTION   GABAPENTIN (NEURONTIN) 300 MG CAPSULE    Take two capsules by mouth at bedtime for pains   LANSOPRAZOLE (PREVACID) 30 MG CAPSULE    Take one capsule by mouth once daily for stomach   LATANOPROST (XALATAN) 0.005 % OPHTHALMIC SOLUTION    Place 1 drop into both eyes at bedtime.    LEVOTHYROXINE (SYNTHROID, LEVOTHROID) 75 MCG TABLET    TAKE 1 TABLET BY MOUTH ONCE A DAY FOR THYROID SUPPLEMENT   METFORMIN (GLUCOPHAGE) 1000 MG TABLET    TAKE 1 TABLET BY MOUTH TWICE A DAY TO CONTROL DIABETES   MICROLET LANCETS MISC    by Other route.   MULTIPLE VITAMINS-MINERALS (CENTRUM SILVER PO)    Take 1 tablet by mouth daily.   NON FORMULARY    Take 1 capsule by mouth 2 (two) times daily. to help with Vision   ONE TOUCH ULTRA TEST TEST STRIP    by Other route.  POLYETHYLENE GLYCOL (MIRALAX / GLYCOLAX) PACKET    Take 17 g by mouth daily.   PREGABALIN (LYRICA) 75 MG CAPSULE    Take one capsule by mouth twice daily to help with pains and nerves   SACCHAROMYCES BOULARDII (FLORASTOR) 250 MG CAPSULE    Take 1 capsule (250 mg total) by mouth 2 (two) times daily.   TOBRAMYCIN-DEXAMETHASONE (TOBRADEX) OPHTHALMIC SOLUTION    Place 1 drop into the left eye daily as needed (eye irritation).    TRAMADOL (ULTRAM) 50 MG TABLET    TAKE 1 TABLET BY MOUTH EVERY 12 HOURS AS NEEDED FOR PAIN   TROSPIUM CHLORIDE 60 MG CP24    TAKE 1 CAPSULE BY MOUTH DAILY FOR BLADDER CONTROL   VITAMIN C (ASCORBIC ACID) 250 MG TABLET    Take 250 mg by mouth daily.   ZOLPIDEM (AMBIEN) 5 MG TABLET    Take 5 mg by mouth at bedtime as needed for sleep.  Modified Medications   Modified Medication Previous Medication   OXYCODONE (ROXICODONE) 5 MG IMMEDIATE RELEASE  TABLET oxyCODONE (ROXICODONE) 5 MG immediate release tablet      One every 6 hours if needed for severe pain    One every 6 hours if needed for severe pain  Discontinued Medications   CEPHALEXIN (KEFLEX) 500 MG CAPSULE    Take 1 capsule three times daily.   CIPROFLOXACIN (CIPRO) 500 MG TABLET    One twice daily to treat infection   CIPROFLOXACIN (CIPRO) 500 MG TABLET    One twice daily to treat infection    Review of Systems  Constitutional: Positive for activity change and fatigue. Negative for fever, chills and unexpected weight change.  HENT: Positive for hearing loss. Negative for ear pain.   Eyes: Negative.        Wears corrective lenses  Respiratory: Positive for cough.        Dry cough for the last year.  Cardiovascular: Positive for leg swelling.  Gastrointestinal: Negative.   Endocrine:       History of diabetes  Genitourinary:       Incontinence  Musculoskeletal: Positive for myalgias, back pain, arthralgias and gait problem.       Tender above ankles  Skin:       Bedsores since in hospital  Neurological: Positive for tremors (Choreiform jerking movements), weakness and numbness. Negative for dizziness, seizures, syncope, speech difficulty and light-headedness.       Numb in the feet and the lower legs  Hematological: Negative.   Psychiatric/Behavioral:       Memory loss and associated mild confusion.    Filed Vitals:   11/29/14 1220  BP: 124/76  Pulse: 76  Temp: 98.1 F (36.7 C)  TempSrc: Oral  Resp: 16  Height: 5' (1.524 m)  Weight: 160 lb (72.576 kg)  SpO2: 96%   Body mass index is 31.25 kg/(m^2).  Physical Exam  Constitutional: She is oriented to person, place, and time. No distress.  Overweight.  HENT:  Severe hearing loss in the right ear  Eyes: Conjunctivae and EOM are normal. Pupils are equal, round, and reactive to light.  Neck: Normal range of motion. Neck supple. No JVD present. No tracheal deviation present. No thyromegaly present.    Cardiovascular:  Rate controlled irregularly irregular rhythm. No murmur. No gallop.  Pulmonary/Chest: Effort normal. No respiratory distress. She has no wheezes. She has no rales. She exhibits no tenderness.  Abdominal: She exhibits no distension and no mass. There is no tenderness.  Musculoskeletal: She exhibits edema. She exhibits no tenderness.  Bilateral knee tenderness and crepitance. Mild back discomfort. Tender above ankles.  Lymphadenopathy:    She has no cervical adenopathy.  Neurological: She is alert and oriented to person, place, and time. No cranial nerve deficit. Coordination normal.  Mild memory loss. Choreiform jerking movements of the upper extremities.  Skin: No rash noted. She is not diaphoretic. No erythema. No pallor.  Healed decubitus of buttocks near intergluteal fold  Psychiatric: She has a normal mood and affect. Her behavior is normal. Thought content normal.    Labs reviewed: Lab Summary Latest Ref Rng 08/29/2014 07/12/2014 07/12/2014 04/21/2014  Hemoglobin 12.0 - 15.0 g/dL (None) (None) 11.3(L) 11.7  Hematocrit 36.0 - 46.0 % (None) (None) 35.6(L) 35.2  White count 4.0 - 10.5 K/uL (None) (None) 8.5 10.3  Platelet count 150 - 400 K/uL (None) (None) 281 (None)  Sodium 134 - 144 mmol/L 141 (None) 137 140  Potassium 3.5 - 5.2 mmol/L 5.3(H) (None) 5.7(H) 4.6  Calcium 8.7 - 10.3 mg/dL 9.6 (None) 9.8 10.4(H)  Phosphorus - (None) (None) (None) (None)  Creatinine 0.57 - 1.00 mg/dL 2.47(H) 1.74(H) QUANTITY NOT SUFFICIENT, UNABLE TO PERFORM TEST 1.81(H)  AST 0 - 40 IU/L 12 (None) 23 12  Alk Phos 39 - 117 IU/L 130(H) (None) 135(H) 109  Bilirubin 0.0 - 1.2 mg/dL 0.2 (None) 0.6 <0.2  Glucose 65 - 99 mg/dL 131(H) (None) 127(H) 147(H)  Cholesterol - (None) (None) (None) (None)  HDL cholesterol - (None) (None) (None) (None)  Triglycerides - (None) (None) (None) (None)  LDL Direct - (None) (None) (None) (None)  LDL Calc - (None) (None) (None) (None)  Total protein 6.5 - 8.1  g/dL (None) (None) 7.0 (None)  Albumin 3.2 - 4.6 g/dL 3.9 (None) 3.3(L) 3.9   Lab Results  Component Value Date   TSH 3.110 08/29/2014   Lab Results  Component Value Date   BUN 34 08/29/2014   Lab Results  Component Value Date   HGBA1C 6.8* 08/29/2014    Assessment/Plan  1. Contusion of foot, left, initial encounter - oxyCODONE (ROXICODONE) 5 MG immediate release tablet; One every 6 hours if needed for severe pain  Dispense: 50 tablet; Refill: 0  2. Type 2 diabetes mellitus with stage 3 chronic kidney disease, without long-term current use of insulin (HCC) - Hemoglobin A1c  3. Essential hypertension - Comprehensive metabolic panel  4. Hypothyroidism, unspecified hypothyroidism type - TSH  5. Hyperpotassemia - Comprehensive metabolic panel

## 2014-11-30 LAB — COMPREHENSIVE METABOLIC PANEL
A/G RATIO: 1.5 (ref 1.1–2.5)
ALBUMIN: 4.2 g/dL (ref 3.2–4.6)
ALK PHOS: 114 IU/L (ref 39–117)
ALT: 12 IU/L (ref 0–32)
AST: 10 IU/L (ref 0–40)
BUN / CREAT RATIO: 23 (ref 11–26)
BUN: 46 mg/dL — ABNORMAL HIGH (ref 10–36)
Bilirubin Total: 0.2 mg/dL (ref 0.0–1.2)
CO2: 24 mmol/L (ref 18–29)
CREATININE: 1.98 mg/dL — AB (ref 0.57–1.00)
Calcium: 11 mg/dL — ABNORMAL HIGH (ref 8.7–10.3)
Chloride: 100 mmol/L (ref 97–108)
GFR calc Af Amer: 24 mL/min/{1.73_m2} — ABNORMAL LOW (ref >59)
GFR, EST NON AFRICAN AMERICAN: 21 mL/min/{1.73_m2} — AB (ref >59)
GLOBULIN, TOTAL: 2.8 g/dL (ref 1.5–4.5)
Glucose: 95 mg/dL (ref 65–99)
POTASSIUM: 5.6 mmol/L — AB (ref 3.5–5.2)
SODIUM: 143 mmol/L (ref 134–144)
Total Protein: 7 g/dL (ref 6.0–8.5)

## 2014-11-30 LAB — HEMOGLOBIN A1C
Est. average glucose Bld gHb Est-mCnc: 151 mg/dL
HEMOGLOBIN A1C: 6.9 % — AB (ref 4.8–5.6)

## 2014-11-30 LAB — TSH: TSH: 2.5 u[IU]/mL (ref 0.450–4.500)

## 2014-12-04 ENCOUNTER — Other Ambulatory Visit: Payer: Self-pay | Admitting: Internal Medicine

## 2014-12-13 ENCOUNTER — Encounter: Payer: Self-pay | Admitting: Internal Medicine

## 2014-12-13 ENCOUNTER — Telehealth: Payer: Self-pay | Admitting: *Deleted

## 2014-12-13 ENCOUNTER — Ambulatory Visit (INDEPENDENT_AMBULATORY_CARE_PROVIDER_SITE_OTHER): Payer: Medicare Other | Admitting: Internal Medicine

## 2014-12-13 VITALS — BP 140/76 | HR 68 | Temp 97.8°F | Ht 60.0 in | Wt 166.8 lb

## 2014-12-13 DIAGNOSIS — R300 Dysuria: Secondary | ICD-10-CM | POA: Diagnosis not present

## 2014-12-13 DIAGNOSIS — N390 Urinary tract infection, site not specified: Secondary | ICD-10-CM

## 2014-12-13 LAB — POCT URINALYSIS DIPSTICK
BILIRUBIN UA: NEGATIVE
GLUCOSE UA: NEGATIVE
KETONES UA: NEGATIVE
Nitrite, UA: POSITIVE
PROTEIN UA: 2000
Urobilinogen, UA: NEGATIVE
pH, UA: 8.5

## 2014-12-13 MED ORDER — NITROFURANTOIN MONOHYD MACRO 100 MG PO CAPS: ORAL_CAPSULE | ORAL | Status: DC

## 2014-12-13 NOTE — Telephone Encounter (Signed)
Patient's son [name](David) called regarding his mother having some burning when urinating, questionable UTI. Called patient's son to have him bring her in to the office today at 12:15 pm.

## 2014-12-13 NOTE — Progress Notes (Signed)
Patient ID: [name], female   DOB: 1916/05/17, 81 y.o.   MRN: [id number]    Facility  [facility]    Place of Service:   OFFICE    Allergies  Allergen Reactions  . Noroxin [Norfloxacin] Swelling  . Sulfa Antibiotics Nausea And Vomiting  . Prozac [Fluoxetine Hcl] Rash    Chief Complaint  Patient presents with  . Acute Visit    Possibel UTI, burning with urination    HPI:  Mild dysuria for the last 3 days. Nervous morning it got much worse. No fever or nausea. No chills.  Medications: Patient's Medications  New Prescriptions   No medications on file  Previous Medications   ALREX 0.2 % SUSP    Place 1 drop into both eyes 2 (two) times daily.   ASPIRIN EC 81 MG TABLET    Take 1 tablet (81 mg total) by mouth daily.   ATORVASTATIN (LIPITOR) 20 MG TABLET    TAKE 1 TABLET BY MOUTH ONCE DAILY TO LOWER CHOLESTEROL   BENICAR 20 MG TABLET    TAKE 1/2 TABLET BY MOUTH ONCE DAILY FOR BLOOD PRESSURE   BLOOD GLUCOSE MONITORING SUPPL (CONTOUR BLOOD GLUCOSE SYSTEM) DEVI    Use to test blood sugar twice daily. Dx: E11.65   CALCITONIN, SALMON, (MIACALCIN/FORTICAL) 200 UNIT/ACT NASAL SPRAY    INSTILL 1 SPRAY INTO ALTERNATING NOSTRILS ONCE A DAY FOR OSTEOPROSIS   COMBIGAN 0.2-0.5 % OPHTHALMIC SOLUTION    Place 1 drop into the left eye 2 (two) times daily.    DEXTROMETHORPHAN-GUAIFENESIN (MUCINEX DM) 30-600 MG PER 12 HR TABLET    Take 1 tablet by mouth every 12 (twelve) hours as needed for cough.    DICLOFENAC SODIUM (VOLTAREN) 1 % GEL    Apply 2 g topically as needed. Apply twice a day to legs   DOCUSATE SODIUM 100 MG CAPS    Take 100 mg by mouth 2 (two) times daily.   FUROSEMIDE (LASIX) 20 MG TABLET    take 1 tablet by mouth once daily if needed for FLUID RETENTION   GABAPENTIN (NEURONTIN) 300 MG CAPSULE    Take two capsules by mouth at bedtime for pains   LANSOPRAZOLE (PREVACID) 30 MG CAPSULE    Take one capsule by mouth once daily for stomach   LATANOPROST (XALATAN) 0.005 % OPHTHALMIC SOLUTION     Place 1 drop into both eyes at bedtime.    LEVOTHYROXINE (SYNTHROID, LEVOTHROID) 75 MCG TABLET    TAKE 1 TABLET BY MOUTH ONCE A DAY FOR THYROID SUPPLEMENT   METFORMIN (GLUCOPHAGE) 1000 MG TABLET    TAKE 1 TABLET BY MOUTH TWICE A DAY TO CONTROL DIABETES   MICROLET LANCETS MISC    by Other route.   MULTIPLE VITAMINS-MINERALS (CENTRUM SILVER PO)    Take 1 tablet by mouth daily.   NON FORMULARY    Take 1 capsule by mouth 2 (two) times daily. to help with Vision   ONE TOUCH ULTRA TEST TEST STRIP    by Other route.   OXYCODONE (ROXICODONE) 5 MG IMMEDIATE RELEASE TABLET    One every 6 hours if needed for severe pain   POLYETHYLENE GLYCOL (MIRALAX / GLYCOLAX) PACKET    Take 17 g by mouth daily.   PREGABALIN (LYRICA) 75 MG CAPSULE    Take one capsule by mouth twice daily to help with pains and nerves   SACCHAROMYCES BOULARDII (FLORASTOR) 250 MG CAPSULE    Take 1 capsule (250 mg total) by mouth 2 (two) times  daily.   TOBRAMYCIN-DEXAMETHASONE (TOBRADEX) OPHTHALMIC SOLUTION    Place 1 drop into the left eye daily as needed (eye irritation).    TRAMADOL (ULTRAM) 50 MG TABLET    TAKE 1 TABLET BY MOUTH EVERY 12 HOURS AS NEEDED FOR PAIN   TROSPIUM CHLORIDE 60 MG CP24    TAKE 1 CAPSULE BY MOUTH DAILY FOR BLADDER CONTROL   VITAMIN C (ASCORBIC ACID) 250 MG TABLET    Take 250 mg by mouth daily.   ZOLPIDEM (AMBIEN) 5 MG TABLET    Take 5 mg by mouth at bedtime as needed for sleep.  Modified Medications   No medications on file  Discontinued Medications   No medications on file    Review of Systems  Constitutional: Positive for activity change and fatigue. Negative for fever, chills and unexpected weight change.  HENT: Positive for hearing loss. Negative for ear pain.   Eyes: Negative.        Wears corrective lenses  Respiratory: Positive for cough.        Dry cough for the last year.  Cardiovascular: Positive for leg swelling.  Gastrointestinal: Negative.   Endocrine:       History of diabetes    Genitourinary: Positive for dysuria.       Incontinence. Recurrent UTI.  Musculoskeletal: Positive for myalgias, back pain, arthralgias and gait problem.       Tender above ankles  Skin:       Bedsores since in hospital  Neurological: Positive for tremors (Choreiform jerking movements), weakness and numbness. Negative for dizziness, seizures, syncope, speech difficulty and light-headedness.       Numb in the feet and the lower legs  Hematological: Negative.   Psychiatric/Behavioral:       Memory loss and associated mild confusion.    Filed Vitals:   12/13/14 1158  BP: 140/76  Pulse: 68  Temp: 97.8 F (36.6 C)  TempSrc: Oral  Height: 5' (1.524 m)  Weight: 166 lb 12.8 oz (75.66 kg)   Body mass index is 32.58 kg/(m^2).  Physical Exam  Constitutional: She is oriented to person, place, and time. No distress.  Overweight.  HENT:  Severe hearing loss in the right ear  Eyes: Conjunctivae and EOM are normal. Pupils are equal, round, and reactive to light.  Neck: Normal range of motion. Neck supple. No JVD present. No tracheal deviation present. No thyromegaly present.  Cardiovascular:  Rate controlled irregularly irregular rhythm. No murmur. No gallop.  Pulmonary/Chest: Effort normal. No respiratory distress. She has no wheezes. She has no rales. She exhibits no tenderness.  Abdominal: She exhibits no distension and no mass. There is no tenderness.  Musculoskeletal: She exhibits edema. She exhibits no tenderness.  Bilateral knee tenderness and crepitance. Mild back discomfort. Tender above ankles.  Lymphadenopathy:    She has no cervical adenopathy.  Neurological: She is alert and oriented to person, place, and time. No cranial nerve deficit. Coordination normal.  Mild memory loss. Choreiform jerking movements of the upper extremities.  Skin: No rash noted. She is not diaphoretic. No erythema. No pallor.  Healed decubitus of buttocks near intergluteal fold  Psychiatric: She has  a normal mood and affect. Her behavior is normal. Thought content normal.    Labs reviewed: Lab Summary Latest Ref Rng 11/29/2014 08/29/2014 07/12/2014 07/12/2014  Hemoglobin 12.0 - 15.0 g/dL (None) (None) (None) 11.3(L)  Hematocrit 36.0 - 46.0 % (None) (None) (None) 35.6(L)  White count 4.0 - 10.5 K/uL (None) (None) (None) 8.5  Platelet  count 150 - 400 K/uL (None) (None) (None) 281  Sodium 134 - 144 mmol/L 143 141 (None) 137  Potassium 3.5 - 5.2 mmol/L 5.6(H) 5.3(H) (None) 5.7(H)  Calcium 8.7 - 10.3 mg/dL 11.0(H) 9.6 (None) 9.8  Phosphorus - (None) (None) (None) (None)  Creatinine 0.57 - 1.00 mg/dL 1.98(H) 2.47(H) 1.74(H) QUANTITY NOT SUFFICIENT, UNABLE TO PERFORM TEST  AST 0 - 40 IU/L 10 12 (None) 23  Alk Phos 39 - 117 IU/L 114 130(H) (None) 135(H)  Bilirubin 0.0 - 1.2 mg/dL 0.2 0.2 (None) 0.6  Glucose 65 - 99 mg/dL 95 131(H) (None) 127(H)  Cholesterol - (None) (None) (None) (None)  HDL cholesterol - (None) (None) (None) (None)  Triglycerides - (None) (None) (None) (None)  LDL Direct - (None) (None) (None) (None)  LDL Calc - (None) (None) (None) (None)  Total protein 6.5 - 8.1 g/dL (None) (None) (None) 7.0  Albumin 3.2 - 4.6 g/dL 4.2 3.9 (None) 3.3(L)   Lab Results  Component Value Date   TSH 2.500 11/29/2014   Lab Results  Component Value Date   BUN 46* 11/29/2014   Lab Results  Component Value Date   HGBA1C 6.9* 11/29/2014    Assessment/Plan   1. Burning with urination - POC Urinalysis Dipstick---nitrite positive - Culture, Urine - nitrofurantoin, macrocrystal-monohydrate, (MACROBID) 100 MG capsule; One twice daily to treat urine infection  Dispense: 14 capsule; Refill: 0  2. UTI (lower urinary tract infection)

## 2014-12-15 LAB — URINE CULTURE

## 2014-12-20 ENCOUNTER — Other Ambulatory Visit: Payer: Self-pay | Admitting: *Deleted

## 2014-12-20 DIAGNOSIS — R319 Hematuria, unspecified: Principal | ICD-10-CM

## 2014-12-20 DIAGNOSIS — N390 Urinary tract infection, site not specified: Secondary | ICD-10-CM

## 2014-12-20 MED ORDER — LEVOFLOXACIN 250 MG PO TABS: 250.0000 mg | ORAL_TABLET | Freq: Every day | ORAL | Status: DC

## 2014-12-21 ENCOUNTER — Other Ambulatory Visit: Payer: Self-pay

## 2014-12-21 MED ORDER — FUROSEMIDE 20 MG PO TABS: ORAL_TABLET | ORAL | Status: DC

## 2014-12-22 ENCOUNTER — Telehealth: Payer: Self-pay

## 2014-12-22 NOTE — Telephone Encounter (Signed)
Son called didn't get new Rx for UTI, [facility] there isn't anything there. I called pharmacy spoke with Festus BarrenCathy, David did pick it up. Called Onalee HuaDavid back told him she was on Macrodantin then switched to Levofloxacin 25mg . He went to get the bottles, when he looked at it he realized that it wasn't her other medication that started with Felicity CoyerLev. He was sorry for the mix up, appreciated me calling him back

## 2014-12-23 ENCOUNTER — Other Ambulatory Visit: Payer: Self-pay | Admitting: Internal Medicine

## 2015-01-20 ENCOUNTER — Other Ambulatory Visit: Payer: Self-pay | Admitting: Internal Medicine

## 2015-01-26 ENCOUNTER — Other Ambulatory Visit: Payer: Self-pay | Admitting: Internal Medicine

## 2015-01-29 NOTE — Telephone Encounter (Signed)
This is not on patient's medication list, please advise if ok to refill

## 2015-01-30 ENCOUNTER — Other Ambulatory Visit: Payer: Self-pay | Admitting: Internal Medicine

## 2015-02-17 ENCOUNTER — Other Ambulatory Visit: Payer: Self-pay | Admitting: Internal Medicine

## 2015-02-25 ENCOUNTER — Other Ambulatory Visit: Payer: Self-pay | Admitting: Internal Medicine

## 2015-03-06 ENCOUNTER — Other Ambulatory Visit: Payer: Self-pay | Admitting: Internal Medicine

## 2015-03-06 ENCOUNTER — Ambulatory Visit: Payer: Medicare Other | Admitting: Internal Medicine

## 2015-03-06 ENCOUNTER — Telehealth: Payer: Self-pay | Admitting: Internal Medicine

## 2015-03-07 NOTE — Telephone Encounter (Signed)
Close encounter 

## 2015-03-30 ENCOUNTER — Ambulatory Visit: Payer: Medicare Other | Admitting: Internal Medicine

## 2015-04-03 ENCOUNTER — Ambulatory Visit (INDEPENDENT_AMBULATORY_CARE_PROVIDER_SITE_OTHER): Payer: Medicare Other | Admitting: Internal Medicine

## 2015-04-03 VITALS — BP 140/74 | HR 89 | Temp 98.3°F | Resp 20 | Ht 60.0 in | Wt 166.2 lb

## 2015-04-03 DIAGNOSIS — E039 Hypothyroidism, unspecified: Secondary | ICD-10-CM | POA: Diagnosis not present

## 2015-04-03 DIAGNOSIS — E785 Hyperlipidemia, unspecified: Secondary | ICD-10-CM

## 2015-04-03 DIAGNOSIS — N183 Chronic kidney disease, stage 3 unspecified: Secondary | ICD-10-CM

## 2015-04-03 DIAGNOSIS — I10 Essential (primary) hypertension: Secondary | ICD-10-CM

## 2015-04-03 DIAGNOSIS — M79673 Pain in unspecified foot: Secondary | ICD-10-CM | POA: Insufficient documentation

## 2015-04-03 DIAGNOSIS — R05 Cough: Secondary | ICD-10-CM | POA: Diagnosis not present

## 2015-04-03 DIAGNOSIS — M79606 Pain in leg, unspecified: Secondary | ICD-10-CM | POA: Insufficient documentation

## 2015-04-03 DIAGNOSIS — M79604 Pain in right leg: Secondary | ICD-10-CM | POA: Diagnosis not present

## 2015-04-03 DIAGNOSIS — N390 Urinary tract infection, site not specified: Secondary | ICD-10-CM

## 2015-04-03 DIAGNOSIS — D649 Anemia, unspecified: Secondary | ICD-10-CM

## 2015-04-03 DIAGNOSIS — N184 Chronic kidney disease, stage 4 (severe): Secondary | ICD-10-CM

## 2015-04-03 DIAGNOSIS — M79601 Pain in right arm: Secondary | ICD-10-CM

## 2015-04-03 DIAGNOSIS — F0390 Unspecified dementia without behavioral disturbance: Secondary | ICD-10-CM | POA: Diagnosis not present

## 2015-04-03 DIAGNOSIS — E1122 Type 2 diabetes mellitus with diabetic chronic kidney disease: Secondary | ICD-10-CM

## 2015-04-03 DIAGNOSIS — R059 Cough, unspecified: Secondary | ICD-10-CM

## 2015-04-03 NOTE — Progress Notes (Signed)
Patient ID: [name], female   DOB: 1917-02-01, 82 y.o.   MRN: [id number]    Facility  [place]    Place of Service:   OFFICE    Allergies  Allergen Reactions  . Cosopt [Dorzolamide Hcl-Timolol Mal] Other (See Comments)    Redness and burning in both eyes  . Noroxin [Norfloxacin] Swelling  . Sulfa Antibiotics Nausea And Vomiting  . Prozac [Fluoxetine Hcl] Rash    Chief Complaint  Patient presents with  . Medical Management of Chronic Issues    pain in the back of right leg and pain in both heels,raspy cough    HPI:    Leg pain, inferior, right - pains are not disabling. Posterior right calf. No increase in swelling of the leg. No fever or increased knee discomfort. No erythema.  Pain in heel, unspecified laterality - mostly at  night. Using a diabetic foot cream from [employer] that helps the heel pains a little.  Cough - persistent. Bronchial rattle is present. Rarely produces sputum. No hemoptysis. Afebrile.  UTI (lower urinary tract infection) - remains on chronic suppressive therapy with Macrodantin.  Type 2 diabetes mellitus with stage 3 chronic kidney disease, without long-term current use of insulin (HCC) - no recent lab check.  CKD stage 4 due to type 2 diabetes mellitus ([place]) no recent lab -   Anemia, unspecified anemia type - no recent lab   Essential hypertension - controlled   Hypothyroidism, unspecified hypothyroidism type - no recent lab   Hyperlipidemia - no recent lab   Hypercalcemia - last lab October 2016   Dementia, without behavioral disturbance - Unchanged  Medications: Patient's Medications  New Prescriptions   No medications on file  Previous Medications   ALREX 0.2 % SUSP    Place 1 drop into both eyes 2 (two) times daily.   ASPIRIN EC 81 MG TABLET    Take 1 tablet (81 mg total) by mouth daily.   ATORVASTATIN (LIPITOR) 20 MG TABLET    take 1 tablet by mouth once daily TO LOWER CHOLESTEROL   BENICAR 20 MG TABLET    TAKE 1/2 TABLET BY  MOUTH ONCE DAILY FOR BLOOD PRESSURE   BLOOD GLUCOSE MONITORING SUPPL (CONTOUR BLOOD GLUCOSE SYSTEM) DEVI    Use to test blood sugar twice daily. Dx: E11.65   CALCITONIN, SALMON, (MIACALCIN/FORTICAL) 200 UNIT/ACT NASAL SPRAY    INSTILL 1 SPRAY INTO ALTERNATING NOSTRILS ONCE A DAY FOR OSTEOPROSIS   DEXTROMETHORPHAN-GUAIFENESIN (MUCINEX DM) 30-600 MG PER 12 HR TABLET    Take 1 tablet by mouth every 12 (twelve) hours as needed for cough.    DICLOFENAC SODIUM (VOLTAREN) 1 % GEL    Apply 2 g topically as needed. Apply twice a day to legs   DOCUSATE SODIUM 100 MG CAPS    Take 100 mg by mouth 2 (two) times daily.   FLECTOR 1.3 % PTCH    APPLY 1 PATCH EVERY 12 HOURS AS NEEDED FOR PAIN   FUROSEMIDE (LASIX) 20 MG TABLET    take 1 tablet by mouth once daily if needed for FLUID RETENTION   GABAPENTIN (NEURONTIN) 300 MG CAPSULE    take 2 capsules by mouth at bedtime for pain   LANSOPRAZOLE (PREVACID) 30 MG CAPSULE    Take one capsule by mouth once daily for stomach   LATANOPROST (XALATAN) 0.005 % OPHTHALMIC SOLUTION    Place 1 drop into both eyes at bedtime.    LEVOFLOXACIN (LEVAQUIN) 250 MG TABLET    Take  1 tablet (250 mg total) by mouth daily.   LEVOTHYROXINE (SYNTHROID, LEVOTHROID) 75 MCG TABLET    TAKE 1 TABLET BY MOUTH ONCE A DAY FOR THYROID SUPPLEMENT   LYRICA 75 MG CAPSULE    take 1 capsule by mouth twice a day if needed for pain and NERVES   METFORMIN (GLUCOPHAGE) 1000 MG TABLET    TAKE 1 TABLET BY MOUTH TWICE A DAY TO CONTROL DIABETES   MICROLET LANCETS MISC    by Other route.   MULTIPLE VITAMINS-MINERALS (CENTRUM SILVER PO)    Take 1 tablet by mouth daily.   NITROFURANTOIN, MACROCRYSTAL-MONOHYDRATE, (MACROBID) 100 MG CAPSULE    One twice daily to treat urine infection   NON FORMULARY    Take 1 capsule by mouth 2 (two) times daily. to help with Vision   ONE TOUCH ULTRA TEST TEST STRIP    by Other route.   OXYCODONE (ROXICODONE) 5 MG IMMEDIATE RELEASE TABLET    One every 6 hours if needed for severe  pain   POLYETHYLENE GLYCOL (MIRALAX / GLYCOLAX) PACKET    Take 17 g by mouth daily.   SACCHAROMYCES BOULARDII (FLORASTOR) 250 MG CAPSULE    Take 1 capsule (250 mg total) by mouth 2 (two) times daily.   TOBRAMYCIN-DEXAMETHASONE (TOBRADEX) OPHTHALMIC SOLUTION    Place 1 drop into the left eye daily as needed (eye irritation).    TRAMADOL (ULTRAM) 50 MG TABLET    TAKE 1 TABLET BY MOUTH EVERY 12 HOURS AS NEEDED FOR PAIN   TROSPIUM CHLORIDE 60 MG CP24    TAKE 1 CAPSULE BY MOUTH DAILY FOR BLADDER CONTROL   VITAMIN C (ASCORBIC ACID) 250 MG TABLET    Take 250 mg by mouth daily.   ZOLPIDEM (AMBIEN) 5 MG TABLET    Take 5 mg by mouth at bedtime as needed for sleep.  Modified Medications   No medications on file  Discontinued Medications   COMBIGAN 0.2-0.5 % OPHTHALMIC SOLUTION    Place 1 drop into the left eye 2 (two) times daily.    COSOPT PF 22.3-6.8 MG/ML SOLN        Review of Systems  Constitutional: Positive for activity change and fatigue. Negative for fever, chills and unexpected weight change.  HENT: Positive for hearing loss. Negative for ear pain.   Eyes: Negative.        Wears corrective lenses  Respiratory: Positive for cough.        Dry cough for the last year.  Cardiovascular: Positive for leg swelling.  Gastrointestinal: Negative.   Endocrine:       History of diabetes  Genitourinary: Positive for dysuria.       Incontinence. Recurrent UTI.  Musculoskeletal: Positive for myalgias, back pain, arthralgias and gait problem.       Tender above ankles. Pain right calf and in both heels.  Skin:       Bedsores since in hospital  Neurological: Positive for tremors (Choreiform jerking movements), weakness and numbness. Negative for dizziness, seizures, syncope, speech difficulty and light-headedness.       Numb in the feet and the lower legs  Hematological: Negative.   Psychiatric/Behavioral:       Memory loss and associated mild confusion.    Filed Vitals:   04/03/15 1349  BP:  140/74  Pulse: 89  Temp: 98.3 F (36.8 C)  TempSrc: Oral  Resp: 20  Height: 5' (1.524 m)  Weight: 166 lb 3.2 oz (75.388 kg)  SpO2: 98%   Body mass  index is 32.46 kg/(m^2). Filed Weights   04/03/15 1349  Weight: 166 lb 3.2 oz (75.388 kg)     Physical Exam  Constitutional: She is oriented to person, place, and time. No distress.  Overweight.  HENT:  Severe hearing loss in the right ear  Eyes: Conjunctivae and EOM are normal. Pupils are equal, round, and reactive to light.  Neck: Normal range of motion. Neck supple. No JVD present. No tracheal deviation present. No thyromegaly present.  Cardiovascular:  Rate controlled irregularly irregular rhythm. No murmur. No gallop.  Pulmonary/Chest: Effort normal. No respiratory distress. She has no wheezes. She has no rales. She exhibits no tenderness.  Abdominal: She exhibits no distension and no mass. There is no tenderness.  Musculoskeletal: She exhibits edema. She exhibits no tenderness.  Bilateral knee tenderness and crepitance. Mild back discomfort. Tender above ankles. Pain right calf area. Pain in the heels, particularly at night.  Lymphadenopathy:    She has no cervical adenopathy.  Neurological: She is alert and oriented to person, place, and time. No cranial nerve deficit. Coordination normal.  Mild memory loss. Choreiform jerking movements of the upper extremities.  Skin: No rash noted. She is not diaphoretic. No erythema. No pallor.  Healed decubitus of buttocks near intergluteal fold. Long scar right leg medially from saphenous vein obtainment for coronary artery bypass grafting.  Psychiatric: She has a normal mood and affect. Her behavior is normal. Thought content normal.    Labs reviewed: Lab Summary Latest Ref Rng 11/29/2014 08/29/2014 07/12/2014 07/12/2014  Hemoglobin 12.0 - 15.0 g/dL (None) (None) (None) 11.3(L)  Hematocrit 36.0 - 46.0 % (None) (None) (None) 35.6(L)  White count 4.0 - 10.5 K/uL (None) (None) (None) 8.5    Platelet count 150 - 400 K/uL (None) (None) (None) 281  Sodium 134 - 144 mmol/L 143 141 (None) 137  Potassium 3.5 - 5.2 mmol/L 5.6(H) 5.3(H) (None) 5.7(H)  Calcium 8.7 - 10.3 mg/dL 11.0(H) 9.6 (None) 9.8  Phosphorus - (None) (None) (None) (None)  Creatinine 0.57 - 1.00 mg/dL 1.98(H) 2.47(H) 1.74(H) QUANTITY NOT SUFFICIENT, UNABLE TO PERFORM TEST  AST 0 - 40 IU/L 10 12 (None) 23  Alk Phos 39 - 117 IU/L 114 130(H) (None) 135(H)  Bilirubin 0.0 - 1.2 mg/dL 0.2 0.2 (None) 0.6  Glucose 65 - 99 mg/dL 95 131(H) (None) 127(H)  Cholesterol - (None) (None) (None) (None)  HDL cholesterol - (None) (None) (None) (None)  Triglycerides - (None) (None) (None) (None)  LDL Direct - (None) (None) (None) (None)  LDL Calc - (None) (None) (None) (None)  Total protein 6.5 - 8.1 g/dL (None) (None) (None) 7.0  Albumin 3.2 - 4.6 g/dL 4.2 3.9 (None) 3.3(L)   Lab Results  Component Value Date   TSH 2.500 11/29/2014   TSH 3.110 08/29/2014   TSH 2.610 04/21/2014   Lab Results  Component Value Date   BUN 46* 11/29/2014   BUN 34 08/29/2014   BUN 30* 07/12/2014   Lab Results  Component Value Date   HGBA1C 6.9* 11/29/2014   HGBA1C 6.8* 08/29/2014   HGBA1C 6.6* 04/21/2014    Assessment/Plan  1. Leg pain, inferior, right Right calf pain is most likely related to previous surgery on the right leg. There are multiple other potential diagnoses. I did mention possibility of Baker's cyst dissection and DVT, but I consider these unlikely. Patient's son will call me if she is not experiencing some relief from the discomfort by this time next week. She'll be applying Aspercreme of lidocaine to the  posterior calf 4 times daily.  2. Pain in heel, unspecified laterality  Apply Aspercreme of lidocaine to the heels 4 times daily  3. Cough  Mucinex DM or Delsym for cough suppression  4. UTI (lower urinary tract infection)  Follow-up next visit  - Urine culture - Urinalysis  5. Type 2 diabetes mellitus with stage  3 chronic kidney disease, without long-term current use of insulin (HCC) - Hemoglobin A1c; Future - Comprehensive metabolic panel; Future - Microalbumin, urine; Future  6. CKD stage 4 due to type 2 diabetes mellitus ([place]) - Comprehensive metabolic panel; Future  7. Anemia, unspecified anemia type - CBC With Differential; Future  8. Essential hypertension - Comprehensive metabolic panel; Future  9. Hypothyroidism, unspecified hypothyroidism type - TSH; Future  10. Hyperlipidemia - Lipid panel; Future  11. Hypercalcemia - Comprehensive metabolic panel; Future  12. Dementia, without behavioral disturbance  Unchanged

## 2015-04-03 NOTE — Patient Instructions (Signed)
Aspercream with Lidocaine 4 times daily to painful area on the right leg and to the heels.  Mucinex DM twice daily for the cough. Delsym for cough if needed.

## 2015-04-11 ENCOUNTER — Encounter: Payer: Self-pay | Admitting: Internal Medicine

## 2015-04-11 ENCOUNTER — Other Ambulatory Visit: Payer: Self-pay | Admitting: *Deleted

## 2015-04-11 ENCOUNTER — Ambulatory Visit (INDEPENDENT_AMBULATORY_CARE_PROVIDER_SITE_OTHER): Payer: Medicare Other | Admitting: Internal Medicine

## 2015-04-11 VITALS — BP 156/82 | HR 83 | Ht 60.0 in | Wt 169.3 lb

## 2015-04-11 DIAGNOSIS — Q2733 Arteriovenous malformation of digestive system vessel: Secondary | ICD-10-CM

## 2015-04-11 DIAGNOSIS — E1121 Type 2 diabetes mellitus with diabetic nephropathy: Secondary | ICD-10-CM

## 2015-04-11 DIAGNOSIS — Z951 Presence of aortocoronary bypass graft: Secondary | ICD-10-CM

## 2015-04-11 DIAGNOSIS — I480 Paroxysmal atrial fibrillation: Secondary | ICD-10-CM | POA: Diagnosis not present

## 2015-04-11 DIAGNOSIS — K5520 Angiodysplasia of colon without hemorrhage: Secondary | ICD-10-CM

## 2015-04-11 DIAGNOSIS — N184 Chronic kidney disease, stage 4 (severe): Secondary | ICD-10-CM

## 2015-04-11 DIAGNOSIS — E1122 Type 2 diabetes mellitus with diabetic chronic kidney disease: Secondary | ICD-10-CM | POA: Diagnosis not present

## 2015-04-11 MED ORDER — METFORMIN HCL 1000 MG PO TABS: ORAL_TABLET | ORAL | Status: DC

## 2015-04-11 NOTE — Telephone Encounter (Signed)
[facility] 

## 2015-04-11 NOTE — Patient Instructions (Signed)
Your physician wants you to follow-up in: 1 year with Dr. Hilty. You will receive a reminder letter in the mail two months in advance. If you don't receive a letter, please call our office to schedule the follow-up appointment.  

## 2015-04-11 NOTE — Progress Notes (Signed)
OFFICE NOTE  Chief Complaint:  Routine follow-up  Primary Care Physician: Kimber Relic, MD  HPI:  [name]  is a 82 year old female now who has a history of coronary artery bypass more than 15 years ago. Recently had a nuclear stress test which was negative for ischemia. Ejection fraction was 69% and was having some atypical pleuritic-type chest pain. Recently, she has been bothered by a cough and saw Dr. Sherene Sires who feels like she has classic upper airway syndrome. Otherwise, she has lower extremity edema secondary to her saphenous vein harvest and has had weight gain about 16 pounds with a "very good appetite." She had been maintained on Paxil for history of atrial fibrillation, but was admitted recently with a very low hemoglobin of 4. She was found ultimately to have a slow GI bleed and a gastric AVM which was cauterized. She was taken off of present accident has been maintained on low-dose aspirin. After transfusion her hemoglobin has remained stable and despite all this she actually had no evidence of worsening heart failure or angina suggestive of obstructive coronary disease.  [name] returns today for follow-up. She is now 89 and is actually doing fairly well. No chest pain. There is been no recurrent GI bleeding. Her cholesterol and blood pressure monitored by her primary care provider and appear to be within normal limits. She denies any worsening shortness of breath.  I had the pleasure seeing Miss [name] back today in follow-up. Overall she is doing well without complaints. She denies any chest pain or worsening shortness of breath. She gets some mild lower extremity edema mostly from sitting in a wheelchair. EKG shows sinus rhythm today with occasional PVCs. She does have A. Fib which is paroxysmal and is not on anticoagulation due to recent severe GI bleeding. She is on low-dose aspirin. Blood pressure appears well controlled today.  PMHx:  Past Medical History  Diagnosis  Date  . Diabetes type 2, uncontrolled (HCC)   . Bronchitis, acute   . Anemia   . Gait disorder   . Hypertension   . Other disorder of calcium metabolism   . Myoclonus   . Hypothyroid   . Mild memory disturbance   . Hyperlipidemia   . Scoliosis   . Osteoporosis   . Eczema   . Hemoptysis   . Hyperpotassemia   . Unspecified hypertrophic and atrophic condition of skin   . Seborrheic keratosis   . Urinary tract infection, site not specified   . Hematuria, unspecified   . Open wound of knee, leg (except thigh), and ankle, without mention of complication   . Personal history of fall   . Myoclonus   . Chronic kidney disease, unspecified (HCC)   . Varicose veins of lower extremities with inflammation   . Trigger finger (acquired)   . Diastasis of muscle   . Type II or unspecified type diabetes mellitus without mention of complication, not stated as uncontrolled   . Chest pain, unspecified   . Closed fracture of lumbar vertebra without mention of spinal cord injury   . Personality change due to conditions classified elsewhere   . Closed dislocation, thoracic vertebra   . Carpal tunnel syndrome   . Other specified idiopathic peripheral neuropathy   . Unspecified constipation   . Unspecified glaucoma   . Macular degeneration (senile) of retina, unspecified   . Unspecified urinary incontinence   . Unspecified pruritic disorder   . Unspecified closed fracture of pelvis   .  Unspecified vitamin D deficiency   . Other atopic dermatitis and related conditions   . Other disorder of calcium metabolism   . Diabetes with renal manifestations(250.4)   . Arthralgia of temporomandibular joint   . Insomnia, unspecified   . Obesity, unspecified   . Allergic rhinitis, cause unspecified   . Atrial fibrillation (HCC)   . Unspecified hereditary and idiopathic peripheral neuropathy   . Arthropathy, unspecified, site unspecified   . History of nuclear stress test 06/06/2010    lexiscan; normal  pattern of perfusion; low risk     Past Surgical History  Procedure Laterality Date  . Abdominal hysterectomy      1973  . Coronary artery bypass graft      Dr Tyrone Sage 04/1996  . Biopsy breast      2000  . Knee arthroscopy      left Dr Thurston Hole   . Lumbar spine surgery      Dr Newell Coral   . Laser laparoscopy      right eye 04/2005  . Right oophorectomy      1950  . Esophagogastroduodenoscopy N/A 08/17/2012    Procedure: ESOPHAGOGASTRODUODENOSCOPY (EGD);  Surgeon: Hilarie Fredrickson, MD;  Location: [facility];  Service: Endoscopy;  Laterality: N/A;  . Hot hemostasis N/A 08/17/2012    Procedure: HOT HEMOSTASIS (ARGON PLASMA COAGULATION/BICAP);  Surgeon: Hilarie Fredrickson, MD;  Location: [facility];  Service: Endoscopy;  Laterality: N/A;    FAMHx:  Family History  Problem Relation Age of Onset  . Diabetes Mother   . Cancer Sister     colon  . Alzheimer's disease Sister   . Prostate cancer Son     SOCHx:   reports that she has never smoked. She has never used smokeless tobacco. She reports that she drinks alcohol. She reports that she does not use illicit drugs.  ALLERGIES:  Allergies  Allergen Reactions  . Cosopt [Dorzolamide Hcl-Timolol Mal] Other (See Comments)    Redness and burning in both eyes  . Noroxin [Norfloxacin] Swelling  . Sulfa Antibiotics Nausea And Vomiting  . Prozac [Fluoxetine Hcl] Rash    ROS: Pertinent items noted in HPI and remainder of comprehensive ROS otherwise negative.  HOME MEDS: Current Outpatient Prescriptions  Medication Sig Dispense Refill  . ALREX 0.2 % SUSP Place 1 drop into both eyes 2 (two) times daily.    Marland Kitchen aspirin EC 81 MG tablet Take 1 tablet (81 mg total) by mouth daily. 30 tablet 0  . atorvastatin (LIPITOR) 20 MG tablet take 1 tablet by mouth once daily TO LOWER CHOLESTEROL 90 tablet 1  . BENICAR 20 MG tablet TAKE 1/2 TABLET BY MOUTH ONCE DAILY FOR BLOOD PRESSURE 45 tablet 1  . Blood Glucose Monitoring Suppl (CONTOUR BLOOD GLUCOSE  SYSTEM) DEVI Use to test blood sugar twice daily. Dx: E11.65 1 Device 0  . calcitonin, salmon, (MIACALCIN/FORTICAL) 200 UNIT/ACT nasal spray INSTILL 1 SPRAY INTO ALTERNATING NOSTRILS ONCE A DAY FOR OSTEOPROSIS 3.7 mL 5  . dextromethorphan-guaiFENesin (MUCINEX DM) 30-600 MG per 12 hr tablet Take 1 tablet by mouth every 12 (twelve) hours as needed for cough.     . diclofenac sodium (VOLTAREN) 1 % GEL Apply 2 g topically as needed. Apply twice a day to legs    . docusate sodium 100 MG CAPS Take 100 mg by mouth 2 (two) times daily. (Patient taking differently: Take 100 mg by mouth as needed. ) 10 capsule 0  . FLECTOR 1.3 % PTCH APPLY 1 PATCH EVERY 12  HOURS AS NEEDED FOR PAIN 30 patch 3  . furosemide (LASIX) 20 MG tablet take 1 tablet by mouth once daily if needed for FLUID RETENTION 30 tablet 5  . gabapentin (NEURONTIN) 300 MG capsule take 2 capsules by mouth at bedtime for pain 60 capsule 5  . lansoprazole (PREVACID) 30 MG capsule Take one capsule by mouth once daily for stomach 30 capsule 11  . latanoprost (XALATAN) 0.005 % ophthalmic solution Place 1 drop into both eyes at bedtime.     Marland Kitchen levofloxacin (LEVAQUIN) 250 MG tablet Take 1 tablet (250 mg total) by mouth daily. 7 tablet 0  . levothyroxine (SYNTHROID, LEVOTHROID) 75 MCG tablet TAKE 1 TABLET BY MOUTH ONCE A DAY FOR THYROID SUPPLEMENT 30 tablet 5  . LYRICA 75 MG capsule take 1 capsule by mouth twice a day if needed for pain and NERVES 60 capsule 0  . metFORMIN (GLUCOPHAGE) 1000 MG tablet Take one tablet by mouth twice daily to control diabetes 60 tablet 6  . MICROLET LANCETS MISC by Other route.  0  . Multiple Vitamins-Minerals (CENTRUM SILVER PO) Take 1 tablet by mouth daily.    . nitrofurantoin, macrocrystal-monohydrate, (MACROBID) 100 MG capsule One twice daily to treat urine infection 14 capsule 0  . NON FORMULARY Take 1 capsule by mouth 2 (two) times daily. to help with Vision    . ONE TOUCH ULTRA TEST test strip by Other route.  1  .  oxyCODONE (ROXICODONE) 5 MG immediate release tablet One every 6 hours if needed for severe pain 50 tablet 0  . polyethylene glycol (MIRALAX / GLYCOLAX) packet Take 17 g by mouth daily. 14 each 0  . saccharomyces boulardii (FLORASTOR) 250 MG capsule Take 1 capsule (250 mg total) by mouth 2 (two) times daily. 20 capsule 0  . tobramycin-dexamethasone (TOBRADEX) ophthalmic solution Place 1 drop into the left eye daily as needed (eye irritation).     . traMADol (ULTRAM) 50 MG tablet TAKE 1 TABLET BY MOUTH EVERY 12 HOURS AS NEEDED FOR PAIN 60 tablet 5  . Trospium Chloride 60 MG CP24 TAKE 1 CAPSULE BY MOUTH DAILY FOR BLADDER CONTROL 30 capsule 5  . vitamin C (ASCORBIC ACID) 250 MG tablet Take 250 mg by mouth daily.    Marland Kitchen zolpidem (AMBIEN) 5 MG tablet Take 5 mg by mouth at bedtime as needed for sleep.     No current facility-administered medications for this visit.    LABS/IMAGING: No results found for this or any previous visit (from the past 48 hour(s)). No results found.  VITALS: BP 156/82 mmHg  Pulse 83  Ht 5' (1.524 m)  Wt 169 lb 4.8 oz (76.794 kg)  BMI 33.06 kg/m2  EXAM: General appearance: alert and no distress Neck: no adenopathy, no carotid bruit, no JVD, supple, symmetrical, trachea midline and thyroid not enlarged, symmetric, no tenderness/mass/nodules Lungs: clear to auscultation bilaterally Heart: regular rate and rhythm, S1, S2 normal, no murmur, click, rub or gallop Abdomen: soft, non-tender; bowel sounds normal; no masses,  no organomegaly Extremities: extremities normal, atraumatic, no cyanosis or edema Pulses: 2+ and symmetric Skin: Skin color, texture, turgor normal. No rashes or lesions Neurologic: Grossly normal  EKG: Sinus rhythm with occasional PVCs at 83, right bundle branch block  ASSESSMENT: 1. Coronary artery disease status post CABG 2. Hypertension 3. Dyslipidemia 4. Diabetes type 2 5. Paroxysmal atrial fibrillation-maintaining sinus on aspirin 6. Recent  gastric AVM with life-threatening GI bleeding-taken off of pradaxa  PLAN: 1.   Mrs. Garay  is doing well without any worsening shortness of breath or chest pain. She is maintaining sinus rhythm on aspirin. She is not a candidate for warfarin or alternative anticoagulants due to life-threatening GI bleeding. Her cholesterol is managed by her primary care provider and blood pressure is well controlled. Plan to continue her current medications and we'll see her back annually or sooner as necessary.  Chrystie Nose, MD, [facility] Attending Cardiologist [facility]  Chrystie Nose 04/11/2015, 5:04 PM

## 2015-04-16 ENCOUNTER — Other Ambulatory Visit: Payer: Self-pay | Admitting: *Deleted

## 2015-04-16 MED ORDER — PREGABALIN 75 MG PO CAPS: ORAL_CAPSULE | ORAL | Status: DC

## 2015-04-16 NOTE — Telephone Encounter (Signed)
[facility] 

## 2015-05-10 ENCOUNTER — Other Ambulatory Visit: Payer: Self-pay

## 2015-05-10 MED ORDER — LEVOTHYROXINE SODIUM 75 MCG PO TABS: 75.0000 ug | ORAL_TABLET | Freq: Every day | ORAL | Status: DC

## 2015-05-10 NOTE — Telephone Encounter (Signed)
Refill request from [facility], Store # [phone]. Phone: [phone]: Fax: [phone].

## 2015-05-16 ENCOUNTER — Other Ambulatory Visit: Payer: Self-pay | Admitting: *Deleted

## 2015-05-16 MED ORDER — MICROLET LANCETS MISC: Status: DC

## 2015-05-16 NOTE — Telephone Encounter (Signed)
[facility] 

## 2015-06-04 ENCOUNTER — Other Ambulatory Visit: Payer: Self-pay | Admitting: *Deleted

## 2015-06-04 MED ORDER — TRAMADOL HCL 50 MG PO TABS: ORAL_TABLET | ORAL | Status: DC

## 2015-06-04 NOTE — Telephone Encounter (Signed)
[facility] 

## 2015-06-05 ENCOUNTER — Ambulatory Visit: Payer: Medicare Other | Admitting: Internal Medicine

## 2015-06-11 ENCOUNTER — Other Ambulatory Visit: Payer: Self-pay

## 2015-06-11 MED ORDER — CALCITONIN (SALMON) 200 UNIT/ACT NA SOLN: NASAL | Status: AC

## 2015-06-11 MED ORDER — FUROSEMIDE 20 MG PO TABS: ORAL_TABLET | ORAL | Status: DC

## 2015-06-13 ENCOUNTER — Encounter: Payer: Self-pay | Admitting: Internal Medicine

## 2015-06-13 ENCOUNTER — Ambulatory Visit (INDEPENDENT_AMBULATORY_CARE_PROVIDER_SITE_OTHER): Payer: Medicare Other | Admitting: Internal Medicine

## 2015-06-13 VITALS — BP 140/72 | HR 83 | Temp 97.5°F | Ht 60.0 in | Wt 164.0 lb

## 2015-06-13 DIAGNOSIS — I10 Essential (primary) hypertension: Secondary | ICD-10-CM

## 2015-06-13 DIAGNOSIS — S9032XA Contusion of left foot, initial encounter: Secondary | ICD-10-CM

## 2015-06-13 DIAGNOSIS — F0390 Unspecified dementia without behavioral disturbance: Secondary | ICD-10-CM | POA: Diagnosis not present

## 2015-06-13 DIAGNOSIS — E785 Hyperlipidemia, unspecified: Secondary | ICD-10-CM

## 2015-06-13 DIAGNOSIS — E039 Hypothyroidism, unspecified: Secondary | ICD-10-CM

## 2015-06-13 DIAGNOSIS — N183 Chronic kidney disease, stage 3 (moderate): Secondary | ICD-10-CM | POA: Diagnosis not present

## 2015-06-13 DIAGNOSIS — N184 Chronic kidney disease, stage 4 (severe): Secondary | ICD-10-CM

## 2015-06-13 DIAGNOSIS — E1122 Type 2 diabetes mellitus with diabetic chronic kidney disease: Secondary | ICD-10-CM | POA: Diagnosis not present

## 2015-06-13 DIAGNOSIS — R05 Cough: Secondary | ICD-10-CM | POA: Diagnosis not present

## 2015-06-13 DIAGNOSIS — R059 Cough, unspecified: Secondary | ICD-10-CM

## 2015-06-13 DIAGNOSIS — D649 Anemia, unspecified: Secondary | ICD-10-CM

## 2015-06-13 NOTE — Progress Notes (Signed)
Patient ID: [name], female   DOB: 03-Jan-1917, 82 y.o.   MRN: [id number]    Facility  [facility]    Place of Service:   OFFICE    Allergies  Allergen Reactions  . Cosopt [Dorzolamide Hcl-Timolol Mal] Other (See Comments)    Redness and burning in both eyes  . Noroxin [Norfloxacin] Swelling  . Sulfa Antibiotics Nausea And Vomiting  . Prozac [Fluoxetine Hcl] Rash    Chief Complaint  Patient presents with  . Medical Management of Chronic Issues    2 month OV, blood sugar, CKD, blood pressure, thyroid, cholesterol. Here with son Shanon Brow  . big toe    left foot been sore, heels still sore    HPI:  Right ankle sore with movement. May have twisted it the other day.  Heels sore. Usin Aspercream with Lidocaine which seems to help.  Wheezy cough that seems chronic. Using Mucinex Dm that seems to help.  Type 2 diabetes mellitus with stage 3 chronic kidney disease, without long-term current use of insulin (HCC) - Well controlled with home capillary glucose running normal -   CKD stage 4 due to type 2 diabetes mellitus ([place]) - needs follow-up lab  Anemia, unspecified anemia type - needs follow-up lab  Dementia, without behavioral disturbance - slowly progressive. Not much change since last visit.  Essential hypertension - controlled  Hyperlipidemia - H follow-up lab  Hypothyroidism, unspecified hypothyroidism type -- Needs follow-up lab   Medications: Patient's Medications  New Prescriptions   No medications on file  Previous Medications   ASPIRIN EC 81 MG TABLET    Take 1 tablet (81 mg total) by mouth daily.   ATORVASTATIN (LIPITOR) 20 MG TABLET    take 1 tablet by mouth once daily TO LOWER CHOLESTEROL   BENICAR 20 MG TABLET    TAKE 1/2 TABLET BY MOUTH ONCE DAILY FOR BLOOD PRESSURE   BLOOD GLUCOSE MONITORING SUPPL (CONTOUR BLOOD GLUCOSE SYSTEM) DEVI    Use to test blood sugar twice daily. Dx: E11.65   CALCITONIN, SALMON, (MIACALCIN/FORTICAL) 200 UNIT/ACT NASAL SPRAY     INSTILL 1 SPRAY INTO ALTERNATING NOSTRILS ONCE A DAY FOR OSTEOPROSIS   DEXTROMETHORPHAN-GUAIFENESIN (MUCINEX DM) 30-600 MG PER 12 HR TABLET    Take 1 tablet by mouth every 12 (twelve) hours as needed for cough.    DICLOFENAC SODIUM (VOLTAREN) 1 % GEL    Apply 2 g topically as needed. Apply twice a day to legs   DOCUSATE SODIUM 100 MG CAPS    Take 100 mg by mouth 2 (two) times daily.   FLECTOR 1.3 % PTCH    APPLY 1 PATCH EVERY 12 HOURS AS NEEDED FOR PAIN   FUROSEMIDE (LASIX) 20 MG TABLET    take 1 tablet by mouth once daily if needed for FLUID RETENTION   GABAPENTIN (NEURONTIN) 300 MG CAPSULE    take 2 capsules by mouth at bedtime for pain   LANSOPRAZOLE (PREVACID) 30 MG CAPSULE    Take one capsule by mouth once daily for stomach   LATANOPROST (XALATAN) 0.005 % OPHTHALMIC SOLUTION    Place 1 drop into both eyes at bedtime.    LEVOTHYROXINE (SYNTHROID, LEVOTHROID) 75 MCG TABLET    Take 1 tablet (75 mcg total) by mouth daily before breakfast.   METFORMIN (GLUCOPHAGE) 1000 MG TABLET    Take one tablet by mouth twice daily to control diabetes   MICROLET LANCETS MISC    Use to test blood sugar twice daily   MULTIPLE  VITAMINS-MINERALS (CENTRUM SILVER PO)    Take 1 tablet by mouth daily.   NON FORMULARY    Take 1 capsule by mouth 2 (two) times daily. to help with Vision   ONE TOUCH ULTRA TEST TEST STRIP    by Other route.   OXYCODONE (ROXICODONE) 5 MG IMMEDIATE RELEASE TABLET    One every 6 hours if needed for severe pain   POLYETHYLENE GLYCOL (MIRALAX / GLYCOLAX) PACKET    Take 17 g by mouth daily.   PREGABALIN (LYRICA) 75 MG CAPSULE    Take one capsule by mouth twice daily as needed for pain and nerves   SACCHAROMYCES BOULARDII (FLORASTOR) 250 MG CAPSULE    Take 1 capsule (250 mg total) by mouth 2 (two) times daily.   TOBRAMYCIN-DEXAMETHASONE (TOBRADEX) OPHTHALMIC SOLUTION    Place 1 drop into the left eye daily as needed (eye irritation).    TRAMADOL (ULTRAM) 50 MG TABLET    Take one tablet by mouth  every 12 hours as needed for pain   TROSPIUM CHLORIDE 60 MG CP24    TAKE 1 CAPSULE BY MOUTH DAILY FOR BLADDER CONTROL   VITAMIN C (ASCORBIC ACID) 250 MG TABLET    Take 250 mg by mouth daily.   ZOLPIDEM (AMBIEN) 5 MG TABLET    Take 5 mg by mouth at bedtime as needed for sleep.  Modified Medications   No medications on file  Discontinued Medications   ALREX 0.2 % SUSP    Place 1 drop into both eyes 2 (two) times daily.   LEVOFLOXACIN (LEVAQUIN) 250 MG TABLET    Take 1 tablet (250 mg total) by mouth daily.   NITROFURANTOIN, MACROCRYSTAL-MONOHYDRATE, (MACROBID) 100 MG CAPSULE    One twice daily to treat urine infection    Review of Systems  Constitutional: Positive for activity change and fatigue. Negative for fever, chills and unexpected weight change.  HENT: Positive for hearing loss. Negative for ear pain.   Eyes: Negative.        Wears corrective lenses  Respiratory: Positive for cough (Persistent and mostly nonproductive).        Dry cough for the last year.  Cardiovascular: Positive for leg swelling.  Gastrointestinal: Negative.   Endocrine:       History of diabetes and compensated hypothyroidism  Genitourinary: Positive for dysuria.       Incontinence. Recurrent UTI.  Musculoskeletal: Positive for myalgias, back pain, arthralgias and gait problem.       Tender at right ankle and heels. Pain right calf..  Neurological: Positive for tremors (Choreiform jerking movements), weakness and numbness. Negative for dizziness, seizures, syncope, speech difficulty and light-headedness.       Numb in the feet and the lower legs  Hematological: Negative.   Psychiatric/Behavioral:       Memory loss and associated mild confusion.    Filed Vitals:   06/13/15 1640  BP: 140/72  Pulse: 83  Temp: 97.5 F (36.4 C)  TempSrc: Oral  Height: 5' (1.524 m)  Weight: 164 lb (74.39 kg)  SpO2: 99%   Body mass index is 32.03 kg/(m^2). Filed Weights   06/13/15 1640  Weight: 164 lb (74.39 kg)      Physical Exam  Constitutional: She is oriented to person, place, and time. No distress.  Overweight.  HENT:  Severe hearing loss in the right ear  Eyes: Conjunctivae and EOM are normal. Pupils are equal, round, and reactive to light.  Neck: Normal range of motion. Neck supple. No JVD  present. No tracheal deviation present. No thyromegaly present.  Cardiovascular:  Rate controlled irregularly irregular rhythm. No murmur. No gallop.  Pulmonary/Chest: Effort normal. No respiratory distress. She has no wheezes. She has no rales. She exhibits no tenderness.  Abdominal: She exhibits no distension and no mass. There is no tenderness.  Musculoskeletal: She exhibits edema. She exhibits no tenderness.  Bilateral knee tenderness and crepitance. Mild back discomfort. Tender above ankles. Pain right calf area. Pain in the heels, particularly at night.  Lymphadenopathy:    She has no cervical adenopathy.  Neurological: She is alert and oriented to person, place, and time. No cranial nerve deficit. Coordination normal.  Mild memory loss. Choreiform jerking movements of the upper extremities.  Skin: No rash noted. She is not diaphoretic. No erythema. No pallor.  Healed decubitus of buttocks near intergluteal fold. Long scar right leg medially from saphenous vein obtainment for coronary artery bypass grafting.  Psychiatric: She has a normal mood and affect. Her behavior is normal. Thought content normal.    Labs reviewed: Lab Summary Latest Ref Rng 11/29/2014 08/29/2014 07/12/2014 07/12/2014  Hemoglobin 12.0 - 15.0 g/dL (None) (None) (None) 11.3(L)  Hematocrit 36.0 - 46.0 % (None) (None) (None) 35.6(L)  White count 4.0 - 10.5 K/uL (None) (None) (None) 8.5  Platelet count 150 - 400 K/uL (None) (None) (None) 281  Sodium 134 - 144 mmol/L 143 141 (None) 137  Potassium 3.5 - 5.2 mmol/L 5.6(H) 5.3(H) (None) 5.7(H)  Calcium 8.7 - 10.3 mg/dL 11.0(H) 9.6 (None) 9.8  Phosphorus - (None) (None) (None)  (None)  Creatinine 0.57 - 1.00 mg/dL 1.98(H) 2.47(H) 1.74(H) QUANTITY NOT SUFFICIENT, UNABLE TO PERFORM TEST  AST 0 - 40 IU/L 10 12 (None) 23  Alk Phos 39 - 117 IU/L 114 130(H) (None) 135(H)  Bilirubin 0.0 - 1.2 mg/dL 0.2 0.2 (None) 0.6  Glucose 65 - 99 mg/dL 95 131(H) (None) 127(H)  Cholesterol - (None) (None) (None) (None)  HDL cholesterol - (None) (None) (None) (None)  Triglycerides - (None) (None) (None) (None)  LDL Direct - (None) (None) (None) (None)  LDL Calc - (None) (None) (None) (None)  Total protein 6.5 - 8.1 g/dL (None) (None) (None) 7.0  Albumin 3.2 - 4.6 g/dL 4.2 3.9 (None) 3.3(L)   Lab Results  Component Value Date   TSH 2.500 11/29/2014   TSH 3.110 08/29/2014   TSH 2.610 04/21/2014   Lab Results  Component Value Date   BUN 46* 11/29/2014   BUN 34 08/29/2014   BUN 30* 07/12/2014   Lab Results  Component Value Date   HGBA1C 6.9* 11/29/2014   HGBA1C 6.8* 08/29/2014   HGBA1C 6.6* 04/21/2014    Assessment/Plan  1. Type 2 diabetes mellitus with stage 3 chronic kidney disease, without long-term current use of insulin (HCC) -Continue current medications. Future lab should include A1c, microalbumin, CMP  2. CKD stage 4 due to type 2 diabetes mellitus (HCC) - CMP, future  3. Anemia, unspecified anemia type - CBC, future  4. Dementia, without behavioral disturbance -Follow-up MMSE in future   5. Cough Persistent and nonproductive. Chronic bronchitis.  6. Essential hypertension controlled  7. Hyperlipidemia -Lipid panel, future  8. Hypothyroidism, unspecified hypothyroidism type -TSH, future  9. Contusion of left foot, initial encounter Observe. Continue Aspercreme to feet and heels since this seems to help relieve symptoms.

## 2015-06-14 ENCOUNTER — Other Ambulatory Visit: Payer: Self-pay | Admitting: Internal Medicine

## 2015-06-14 ENCOUNTER — Telehealth: Payer: Self-pay | Admitting: Internal Medicine

## 2015-06-14 NOTE — Telephone Encounter (Signed)
Mrs. Gwenette GreetGray's son Laureen AbrahamsDavid Arai dropped off a handicapp form to be filled out by Dr. Chilton SiGreen. The form was placed in Dr. Thomasene LotGreen's tray for him to sign.

## 2015-07-04 ENCOUNTER — Other Ambulatory Visit: Payer: Self-pay

## 2015-07-04 MED ORDER — PREGABALIN 75 MG PO CAPS: ORAL_CAPSULE | ORAL | Status: DC

## 2015-07-17 ENCOUNTER — Other Ambulatory Visit: Payer: Self-pay

## 2015-07-17 MED ORDER — OLMESARTAN MEDOXOMIL 20 MG PO TABS: ORAL_TABLET | ORAL | Status: DC

## 2015-07-20 ENCOUNTER — Other Ambulatory Visit: Payer: Self-pay | Admitting: *Deleted

## 2015-07-20 MED ORDER — TROSPIUM CHLORIDE ER 60 MG PO CP24: ORAL_CAPSULE | ORAL | Status: DC

## 2015-07-20 NOTE — Telephone Encounter (Signed)
[facility] 

## 2015-08-01 ENCOUNTER — Other Ambulatory Visit: Payer: Self-pay | Admitting: *Deleted

## 2015-08-01 MED ORDER — ATORVASTATIN CALCIUM 20 MG PO TABS: ORAL_TABLET | ORAL | Status: DC

## 2015-08-01 NOTE — Telephone Encounter (Signed)
[facility]

## 2015-09-04 ENCOUNTER — Other Ambulatory Visit: Payer: Self-pay

## 2015-09-04 DIAGNOSIS — N390 Urinary tract infection, site not specified: Secondary | ICD-10-CM

## 2015-09-04 DIAGNOSIS — D649 Anemia, unspecified: Secondary | ICD-10-CM

## 2015-09-04 DIAGNOSIS — N186 End stage renal disease: Secondary | ICD-10-CM

## 2015-09-04 DIAGNOSIS — E1122 Type 2 diabetes mellitus with diabetic chronic kidney disease: Secondary | ICD-10-CM

## 2015-09-04 DIAGNOSIS — E785 Hyperlipidemia, unspecified: Secondary | ICD-10-CM

## 2015-09-04 DIAGNOSIS — E039 Hypothyroidism, unspecified: Secondary | ICD-10-CM

## 2015-09-07 ENCOUNTER — Other Ambulatory Visit: Payer: Medicare Other

## 2015-09-07 DIAGNOSIS — E785 Hyperlipidemia, unspecified: Secondary | ICD-10-CM

## 2015-09-07 DIAGNOSIS — N390 Urinary tract infection, site not specified: Secondary | ICD-10-CM

## 2015-09-07 DIAGNOSIS — E1122 Type 2 diabetes mellitus with diabetic chronic kidney disease: Secondary | ICD-10-CM

## 2015-09-07 DIAGNOSIS — E039 Hypothyroidism, unspecified: Secondary | ICD-10-CM

## 2015-09-07 DIAGNOSIS — N186 End stage renal disease: Secondary | ICD-10-CM

## 2015-09-07 DIAGNOSIS — D649 Anemia, unspecified: Secondary | ICD-10-CM

## 2015-09-07 LAB — CBC WITH DIFFERENTIAL/PLATELET
BASOS PCT: 0 %
Basophils Absolute: 0 cells/uL (ref 0–200)
Eosinophils Absolute: 385 cells/uL (ref 15–500)
Eosinophils Relative: 5 %
HCT: 34.3 % — ABNORMAL LOW (ref 35.0–45.0)
Hemoglobin: 11.2 g/dL — ABNORMAL LOW (ref 11.7–15.5)
LYMPHS PCT: 39 %
Lymphs Abs: 3003 cells/uL (ref 850–3900)
MCH: 31.2 pg (ref 27.0–33.0)
MCHC: 32.7 g/dL (ref 32.0–36.0)
MCV: 95.5 fL (ref 80.0–100.0)
MONOS PCT: 7 %
MPV: 10.5 fL (ref 7.5–12.5)
Monocytes Absolute: 539 cells/uL (ref 200–950)
NEUTROS ABS: 3773 {cells}/uL (ref 1500–7800)
Neutrophils Relative %: 49 %
PLATELETS: 258 10*3/uL (ref 140–400)
RBC: 3.59 MIL/uL — AB (ref 3.80–5.10)
RDW: 13.8 % (ref 11.0–15.0)
WBC: 7.7 10*3/uL (ref 3.8–10.8)

## 2015-09-07 LAB — COMPLETE METABOLIC PANEL WITH GFR
ALT: 9 U/L (ref 6–29)
AST: 15 U/L (ref 10–35)
Albumin: 3.7 g/dL (ref 3.6–5.1)
Alkaline Phosphatase: 88 U/L (ref 33–130)
BILIRUBIN TOTAL: 0.3 mg/dL (ref 0.2–1.2)
BUN: 41 mg/dL — AB (ref 7–25)
CALCIUM: 10 mg/dL (ref 8.6–10.4)
CO2: 23 mmol/L (ref 20–31)
CREATININE: 1.94 mg/dL — AB (ref 0.60–0.88)
Chloride: 104 mmol/L (ref 98–110)
GFR, EST AFRICAN AMERICAN: 25 mL/min — AB (ref >=60)
GFR, Est Non African American: 21 mL/min — ABNORMAL LOW (ref >=60)
Glucose, Bld: 107 mg/dL — ABNORMAL HIGH (ref 65–99)
Potassium: 5.4 mmol/L — ABNORMAL HIGH (ref 3.5–5.3)
Sodium: 139 mmol/L (ref 135–146)
TOTAL PROTEIN: 6.8 g/dL (ref 6.1–8.1)

## 2015-09-07 LAB — LIPID PANEL
CHOL/HDL RATIO: 4 ratio (ref <=5.0)
Cholesterol: 151 mg/dL (ref 125–200)
HDL: 38 mg/dL — ABNORMAL LOW (ref >=46)
LDL CALC: 64 mg/dL (ref <130)
Triglycerides: 246 mg/dL — ABNORMAL HIGH (ref <150)
VLDL: 49 mg/dL — AB (ref <30)

## 2015-09-07 LAB — HEMOGLOBIN A1C
HEMOGLOBIN A1C: 6.5 % — AB (ref <5.7)
MEAN PLASMA GLUCOSE: 140 mg/dL

## 2015-09-07 LAB — TSH: TSH: 5.8 mIU/L — ABNORMAL HIGH

## 2015-09-08 LAB — URINALYSIS
Bilirubin Urine: NEGATIVE
Glucose, UA: NEGATIVE
Ketones, ur: NEGATIVE
NITRITE: POSITIVE — AB
Specific Gravity, Urine: 1.01 (ref 1.001–1.035)
pH: 5.5 (ref 5.0–8.0)

## 2015-09-08 LAB — MICROALBUMIN / CREATININE URINE RATIO
Creatinine, Urine: 52 mg/dL (ref 20–320)
Microalb Creat Ratio: 152 mcg/mg creat — ABNORMAL HIGH (ref <30)
Microalb, Ur: 7.9 mg/dL

## 2015-09-09 LAB — URINE CULTURE

## 2015-09-10 ENCOUNTER — Other Ambulatory Visit: Payer: Self-pay

## 2015-09-10 MED ORDER — GABAPENTIN 300 MG PO CAPS: ORAL_CAPSULE | ORAL | Status: DC

## 2015-09-10 MED ORDER — LANSOPRAZOLE 30 MG PO CPDR: DELAYED_RELEASE_CAPSULE | ORAL | Status: DC

## 2015-09-11 ENCOUNTER — Encounter: Payer: Self-pay | Admitting: Internal Medicine

## 2015-09-11 ENCOUNTER — Ambulatory Visit (INDEPENDENT_AMBULATORY_CARE_PROVIDER_SITE_OTHER): Payer: Medicare Other | Admitting: Internal Medicine

## 2015-09-11 ENCOUNTER — Other Ambulatory Visit: Payer: Self-pay | Admitting: *Deleted

## 2015-09-11 VITALS — BP 152/94 | HR 80 | Temp 98.0°F | Ht 60.0 in | Wt 164.0 lb

## 2015-09-11 DIAGNOSIS — D649 Anemia, unspecified: Secondary | ICD-10-CM | POA: Diagnosis not present

## 2015-09-11 DIAGNOSIS — I10 Essential (primary) hypertension: Secondary | ICD-10-CM | POA: Diagnosis not present

## 2015-09-11 DIAGNOSIS — M7750 Other enthesopathy of unspecified foot: Secondary | ICD-10-CM

## 2015-09-11 DIAGNOSIS — E785 Hyperlipidemia, unspecified: Secondary | ICD-10-CM | POA: Diagnosis not present

## 2015-09-11 DIAGNOSIS — K2990 Gastroduodenitis, unspecified, without bleeding: Secondary | ICD-10-CM

## 2015-09-11 DIAGNOSIS — N184 Chronic kidney disease, stage 4 (severe): Secondary | ICD-10-CM

## 2015-09-11 DIAGNOSIS — N390 Urinary tract infection, site not specified: Secondary | ICD-10-CM | POA: Diagnosis not present

## 2015-09-11 DIAGNOSIS — F0390 Unspecified dementia without behavioral disturbance: Secondary | ICD-10-CM | POA: Diagnosis not present

## 2015-09-11 DIAGNOSIS — E1122 Type 2 diabetes mellitus with diabetic chronic kidney disease: Secondary | ICD-10-CM

## 2015-09-11 DIAGNOSIS — R05 Cough: Secondary | ICD-10-CM | POA: Diagnosis not present

## 2015-09-11 DIAGNOSIS — R059 Cough, unspecified: Secondary | ICD-10-CM

## 2015-09-11 DIAGNOSIS — S9032XA Contusion of left foot, initial encounter: Secondary | ICD-10-CM

## 2015-09-11 DIAGNOSIS — K2970 Gastritis, unspecified, without bleeding: Secondary | ICD-10-CM | POA: Diagnosis not present

## 2015-09-11 DIAGNOSIS — M79673 Pain in unspecified foot: Secondary | ICD-10-CM

## 2015-09-11 DIAGNOSIS — G609 Hereditary and idiopathic neuropathy, unspecified: Secondary | ICD-10-CM

## 2015-09-11 DIAGNOSIS — E1121 Type 2 diabetes mellitus with diabetic nephropathy: Secondary | ICD-10-CM | POA: Diagnosis not present

## 2015-09-11 DIAGNOSIS — E039 Hypothyroidism, unspecified: Secondary | ICD-10-CM | POA: Diagnosis not present

## 2015-09-11 DIAGNOSIS — E875 Hyperkalemia: Secondary | ICD-10-CM

## 2015-09-11 MED ORDER — OXYCODONE HCL 5 MG PO TABS: ORAL_TABLET | ORAL | Status: DC

## 2015-09-11 MED ORDER — PREGABALIN 75 MG PO CAPS: ORAL_CAPSULE | ORAL | Status: DC

## 2015-09-11 MED ORDER — AMPICILLIN 500 MG PO CAPS: 500.0000 mg | ORAL_CAPSULE | Freq: Three times a day (TID) | ORAL | Status: DC

## 2015-09-11 NOTE — Progress Notes (Signed)
Patient ID: [name], female   DOB: 1916-03-02, 82 y.o.   MRN: [id number]    Facility  [facility]    Place of Service:   OFFICE    Allergies  Allergen Reactions  . Cosopt [Dorzolamide Hcl-Timolol Mal] Other (See Comments)    Redness and burning in both eyes  . Noroxin [Norfloxacin] Swelling  . Sulfa Antibiotics Nausea And Vomiting  . Prozac [Fluoxetine Hcl] Rash    Chief Complaint  Patient presents with  . Medical Management of Chronic Issues    3 month medication management blood sugar, CKD, anemia, blood pressure, UTI  . growth    back of right hand, she picks at it and it bleeds, per son Shanon Brow.   Marland [employer] form    done  . 6CIT    total score 12  . Depression    screen PHQ-9 total score 0    HPI:  Contusion of foot, left, initial encounter - Still having some pain. Using oxyCODONE (ROXICODONE) 5 MG immediate release tablet.  Diabetic nephropathy associated with type 2 diabetes mellitus ([place]) - controlled   Gastritis and gastroduodenitis - denies any problems with indigestion or heartburn  Essential hypertension - mild elevation in SBP and DBP in the office today. Home pressures have been normal. Patient denies headache, palpitations, or chest pains.  Hyperlipidemia - controlled  Hypothyroidism, unspecified hypothyroidism type - compensated  Cough  - persistent, intermittent dry cough  CKD stage 4 due to type 2 diabetes mellitus (HCC) - stable  Anemia, unspecified anemia type - improved  Dementia, without behavioral disturbance - unchanged  Calcaneal bursitis (heel), unspecified laterality - intermittently tender at both heels and along the plantar fascia   UTI (lower urinary tract infection) - Escherichia coli UTI documented on last lab. Treated with Cipro. Patient denies dysuria. She continues with incontinence.   Hypercalcemia: normal calcium on last lab work  Hyperkalemia - mild chronic elevation in the potassium level  Pain in heel, unspecified  laterality - bilateral heel pains at the insertion of the plantar fascia  Hereditary and idiopathic peripheral neuropathy - using both gabapentin and pregabalin (LYRICA) 75 MG capsule,    Medications: Patient's Medications  New Prescriptions   No medications on file  Previous Medications   AMPICILLIN (PRINCIPEN) 500 MG CAPSULE    Take 1 capsule (500 mg total) by mouth 3 (three) times daily.   ASPIRIN EC 81 MG TABLET    Take 1 tablet (81 mg total) by mouth daily.   ATORVASTATIN (LIPITOR) 20 MG TABLET    Take one tablet by mouth once daily to lower cholesterol   BLOOD GLUCOSE MONITORING SUPPL (CONTOUR BLOOD GLUCOSE SYSTEM) DEVI    Use to test blood sugar twice daily. Dx: E11.65   CALCITONIN, SALMON, (MIACALCIN/FORTICAL) 200 UNIT/ACT NASAL SPRAY    INSTILL 1 SPRAY INTO ALTERNATING NOSTRILS ONCE A DAY FOR OSTEOPROSIS   DEXTROMETHORPHAN-GUAIFENESIN (MUCINEX DM) 30-600 MG PER 12 HR TABLET    Take 1 tablet by mouth every 12 (twelve) hours as needed for cough.    DICLOFENAC SODIUM (VOLTAREN) 1 % GEL    Apply 2 g topically as needed. Apply twice a day to legs   DOCUSATE SODIUM 100 MG CAPS    Take 100 mg by mouth 2 (two) times daily.   FLECTOR 1.3 % PTCH    APPLY 1 PATCH EVERY 12 HOURS AS NEEDED FOR PAIN   FUROSEMIDE (LASIX) 20 MG TABLET    take 1 tablet by mouth once daily  if needed for FLUID RETENTION   GABAPENTIN (NEURONTIN) 300 MG CAPSULE    take 2 capsules by mouth at bedtime for pain   LANSOPRAZOLE (PREVACID) 30 MG CAPSULE    Take one capsule by mouth once daily for stomach   LATANOPROST (XALATAN) 0.005 % OPHTHALMIC SOLUTION    Place 1 drop into both eyes at bedtime.    LEVOTHYROXINE (SYNTHROID, LEVOTHROID) 75 MCG TABLET    Take 1 tablet (75 mcg total) by mouth daily before breakfast.   METFORMIN (GLUCOPHAGE) 1000 MG TABLET    Take one tablet by mouth twice daily to control diabetes   MICROLET LANCETS MISC    Use to test blood sugar twice daily   MULTIPLE VITAMINS-MINERALS (CENTRUM SILVER PO)     Take 1 tablet by mouth daily.   NON FORMULARY    Take 1 capsule by mouth 2 (two) times daily. to help with Vision   OLMESARTAN (BENICAR) 20 MG TABLET    TAKE 1/2 TABLET BY MOUTH ONCE DAILY FOR BLOOD PRESSURE   ONE TOUCH ULTRA TEST TEST STRIP    TEST TWICE A DAY AS DIRECTED   OXYCODONE (ROXICODONE) 5 MG IMMEDIATE RELEASE TABLET    One every 6 hours if needed for severe pain   POLYETHYLENE GLYCOL (MIRALAX / GLYCOLAX) PACKET    Take 17 g by mouth daily.   PREGABALIN (LYRICA) 75 MG CAPSULE    Take one capsule by mouth twice daily as needed for pain and nerves   SACCHAROMYCES BOULARDII (FLORASTOR) 250 MG CAPSULE    Take 1 capsule (250 mg total) by mouth 2 (two) times daily.   TOBRAMYCIN-DEXAMETHASONE (TOBRADEX) OPHTHALMIC SOLUTION    Place 1 drop into the left eye daily as needed (eye irritation).    TRAMADOL (ULTRAM) 50 MG TABLET    Take one tablet by mouth every 12 hours as needed for pain   TROSPIUM CHLORIDE 60 MG CP24    Take one capsule by mouth once daily for bladder control   VITAMIN C (ASCORBIC ACID) 250 MG TABLET    Take 250 mg by mouth daily.   ZOLPIDEM (AMBIEN) 5 MG TABLET    Take 5 mg by mouth at bedtime as needed for sleep.  Modified Medications   No medications on file  Discontinued Medications   No medications on file    Review of Systems  Constitutional: Positive for activity change and fatigue. Negative for fever, chills and unexpected weight change.  HENT: Positive for hearing loss. Negative for ear pain.   Eyes: Negative.        Wears corrective lenses  Respiratory: Positive for cough (Persistent and mostly nonproductive).        Dry cough for the last year.  Cardiovascular: Positive for leg swelling.  Gastrointestinal: Negative.   Endocrine:       History of diabetes and compensated hypothyroidism  Genitourinary: Positive for dysuria.       Incontinence. Recurrent UTI.  Musculoskeletal: Positive for myalgias, back pain, arthralgias and gait problem.       Tender at  right ankle and heels. Pain right calf..  Neurological: Positive for tremors (Choreiform jerking movements), weakness and numbness. Negative for dizziness, seizures, syncope, speech difficulty and light-headedness.       Numb in the feet and the lower legs  Hematological: Negative.   Psychiatric/Behavioral:       Memory loss and associated mild confusion.    Filed Vitals:   09/11/15 1602  BP: 152/94  Pulse: 80  Temp: 98 F (36.7 C)  TempSrc: Oral  Height: 5' (1.524 m)  Weight: 164 lb (74.39 kg)  SpO2: 94%   Body mass index is 32.03 kg/(m^2). Wt Readings from Last 3 Encounters:  09/11/15 164 lb (74.39 kg)  06/13/15 164 lb (74.39 kg)  04/11/15 169 lb 4.8 oz (76.794 kg)      Physical Exam  Constitutional: She is oriented to person, place, and time. No distress.  Overweight.  HENT:  Severe hearing loss in the right ear  Eyes: Conjunctivae and EOM are normal. Pupils are equal, round, and reactive to light.  Neck: Normal range of motion. Neck supple. No JVD present. No tracheal deviation present. No thyromegaly present.  Cardiovascular:  Rate controlled irregularly irregular rhythm. No murmur. No gallop.  Pulmonary/Chest: Effort normal. No respiratory distress. She has no wheezes. She has no rales. She exhibits no tenderness.  Abdominal: She exhibits no distension and no mass. There is no tenderness.  Musculoskeletal: She exhibits edema. She exhibits no tenderness.  Bilateral knee tenderness and crepitance. Mild back discomfort. Tender above ankles. Pain right calf area. Pain in the heels, particularly at night. Tender at both heels.  Lymphadenopathy:    She has no cervical adenopathy.  Neurological: She is alert and oriented to person, place, and time. No cranial nerve deficit. Coordination normal.  Mild memory loss. Choreiform jerking movements of the upper extremities. 09/11/15 6CIT 10/28  Skin: No rash noted. She is not diaphoretic. No erythema. No pallor.  Healed  decubitus of buttocks near intergluteal fold. Long scar right leg medially from saphenous vein obtainment for coronary artery bypass grafting.  Psychiatric: She has a normal mood and affect. Her behavior is normal. Thought content normal.    Labs reviewed: Lab Summary Latest Ref Rng 09/07/2015 11/29/2014 08/29/2014  Hemoglobin 11.7 - 15.5 g/dL 11.2(L) (None) (None)  Hematocrit 35.0 - 45.0 % 34.3(L) (None) (None)  White count 3.8 - 10.8 K/uL 7.7 (None) (None)  Platelet count 140 - 400 K/uL 258 (None) (None)  Sodium 135 - 146 mmol/L 139 143 141  Potassium 3.5 - 5.3 mmol/L 5.4(H) 5.6(H) 5.3(H)  Calcium 8.6 - 10.4 mg/dL 10.0 11.0(H) 9.6  Phosphorus - (None) (None) (None)  Creatinine 0.60 - 0.88 mg/dL 1.94(H) 1.98(H) 2.47(H)  AST 10 - 35 U/L _0 Alk Phos 33 - 130 U/L 88 114 130(H)  Bilirubin 0.2 - 1.2 mg/dL 0.3 0.2 0.2  Glucose 65 - 99 mg/dL 107(H) 95 131(H)  Cholesterol 125 - 200 mg/dL 151 (None) (None)  HDL cholesterol >=46 mg/dL 38(L) (None) (None)  Triglycerides <150 mg/dL 246(H) (None) (None)  LDL Direct - (None) (None) (None)  LDL Calc <130 mg/dL 64 (None) (None)  Total protein 6.1 - 8.1 g/dL 6.8 (None) (None)  Albumin 3.6 - 5.1 g/dL 3.7 4.2 3.9   Lab Results  Component Value Date   TSH 5.80* 09/07/2015   TSH 2.500 11/29/2014   TSH 3.110 08/29/2014   Lab Results  Component Value Date   BUN 41* 09/07/2015   BUN 46* 11/29/2014   BUN 34 08/29/2014   Lab Results  Component Value Date   HGBA1C 6.5* 09/07/2015   HGBA1C 6.9* 11/29/2014   HGBA1C 6.8* 08/29/2014    Assessment/Plan  1. Essential hypertension Continue current medications  2. Contusion of foot, left, initial encounter - oxyCODONE (ROXICODONE) 5 MG immediate release tablet; One every 6 hours if needed for severe pain  Dispense: 50 tablet; Refill: 0  3. Diabetic nephropathy associated with type  2 diabetes mellitus ([place]) - Hemoglobin A1c; Future - Comprehensive metabolic panel; Future  4. Gastritis and  gastroduodenitis Controlled  5. Hyperlipidemia - Lipid panel; Future  6. Hypothyroidism, unspecified hypothyroidism type - TSH; Future  7. Cough Continue to monitor. Lungs are clear.  8. CKD stage 4 due to type 2 diabetes mellitus ([place]) - Comprehensive metabolic panel; Future  9. Anemia, unspecified anemia type Improved  10. Dementia, without behavioral disturbance Stable  11. Calcaneal bursitis (heel), unspecified laterality Continue oxycodone when necessary  12. UTI (lower urinary tract infection) Treated with Cipro  13. Hypercalcemia Most recent lab normal - Comprehensive metabolic panel; Future  14. Hyperpotassemia Mild elevation of potassium; continue to monitor - Comprehensive metabolic panel; Future  15. Pain in heel, unspecified laterality Continue oxycodone when necessary  16. Hereditary and idiopathic peripheral neuropathy Discontinue gabapentin -Continue pregabalin (LYRICA) 75 MG capsule; Take one capsule by mouth twice daily as needed for pain and nerves  Dispense: 60 capsule; Refill: 5

## 2015-10-15 ENCOUNTER — Encounter: Payer: Self-pay | Admitting: Internal Medicine

## 2015-10-26 ENCOUNTER — Telehealth: Payer: Self-pay | Admitting: *Deleted

## 2015-10-26 ENCOUNTER — Other Ambulatory Visit: Payer: Self-pay | Admitting: *Deleted

## 2015-10-26 MED ORDER — DICLOFENAC SODIUM 1 % TD GEL: 2.0000 g | TRANSDERMAL | 3 refills | Status: DC | PRN

## 2015-10-26 NOTE — Telephone Encounter (Signed)
Patient son stated that patient sprained her foot at home and [facility]. No breaks but significant swelling. Dr. Delton SeeKenneth Barnes wrote her a Rx for Meloxicam 7.5mg  Take one tablet by mouth once daily with food for anti inflammatory. However he wanted you to ok her taking this medication based on her health history. Please Advise.

## 2015-10-30 ENCOUNTER — Encounter: Payer: Self-pay | Admitting: Internal Medicine

## 2015-10-30 NOTE — Telephone Encounter (Signed)
I think short term use of less than 4 weeks of the meloxicam should be OK.

## 2015-10-30 NOTE — Telephone Encounter (Signed)
Patient son notified

## 2015-11-09 ENCOUNTER — Other Ambulatory Visit: Payer: Self-pay | Admitting: Internal Medicine

## 2015-11-13 ENCOUNTER — Emergency Department (HOSPITAL_COMMUNITY): Payer: Medicare Other

## 2015-11-13 ENCOUNTER — Encounter (HOSPITAL_COMMUNITY): Payer: Self-pay

## 2015-11-13 ENCOUNTER — Inpatient Hospital Stay (HOSPITAL_COMMUNITY)
Admission: EM | Admit: 2015-11-13 | Discharge: 2015-11-21 | DRG: 871 | Disposition: A | Discharge status: Skilled Nursing Facility | Payer: Medicare Other | Attending: Internal Medicine | Admitting: Internal Medicine

## 2015-11-13 DIAGNOSIS — M810 Age-related osteoporosis without current pathological fracture: Secondary | ICD-10-CM | POA: Diagnosis present

## 2015-11-13 DIAGNOSIS — Z809 Family history of malignant neoplasm, unspecified: Secondary | ICD-10-CM

## 2015-11-13 DIAGNOSIS — R4182 Altered mental status, unspecified: Secondary | ICD-10-CM | POA: Diagnosis present

## 2015-11-13 DIAGNOSIS — D638 Anemia in other chronic diseases classified elsewhere: Secondary | ICD-10-CM | POA: Diagnosis present

## 2015-11-13 DIAGNOSIS — Z951 Presence of aortocoronary bypass graft: Secondary | ICD-10-CM

## 2015-11-13 DIAGNOSIS — E785 Hyperlipidemia, unspecified: Secondary | ICD-10-CM | POA: Diagnosis present

## 2015-11-13 DIAGNOSIS — Z792 Long term (current) use of antibiotics: Secondary | ICD-10-CM

## 2015-11-13 DIAGNOSIS — N289 Disorder of kidney and ureter, unspecified: Secondary | ICD-10-CM

## 2015-11-13 DIAGNOSIS — J189 Pneumonia, unspecified organism: Secondary | ICD-10-CM

## 2015-11-13 DIAGNOSIS — E878 Other disorders of electrolyte and fluid balance, not elsewhere classified: Secondary | ICD-10-CM | POA: Diagnosis present

## 2015-11-13 DIAGNOSIS — G9340 Encephalopathy, unspecified: Secondary | ICD-10-CM | POA: Diagnosis present

## 2015-11-13 DIAGNOSIS — N390 Urinary tract infection, site not specified: Secondary | ICD-10-CM | POA: Diagnosis present

## 2015-11-13 DIAGNOSIS — E1129 Type 2 diabetes mellitus with other diabetic kidney complication: Secondary | ICD-10-CM | POA: Diagnosis present

## 2015-11-13 DIAGNOSIS — N179 Acute kidney failure, unspecified: Secondary | ICD-10-CM | POA: Diagnosis present

## 2015-11-13 DIAGNOSIS — N184 Chronic kidney disease, stage 4 (severe): Secondary | ICD-10-CM | POA: Diagnosis present

## 2015-11-13 DIAGNOSIS — I10 Essential (primary) hypertension: Secondary | ICD-10-CM | POA: Diagnosis present

## 2015-11-13 DIAGNOSIS — F0390 Unspecified dementia without behavioral disturbance: Secondary | ICD-10-CM | POA: Diagnosis present

## 2015-11-13 DIAGNOSIS — I480 Paroxysmal atrial fibrillation: Secondary | ICD-10-CM | POA: Diagnosis present

## 2015-11-13 DIAGNOSIS — Z820 Family history of epilepsy and other diseases of the nervous system: Secondary | ICD-10-CM

## 2015-11-13 DIAGNOSIS — A419 Sepsis, unspecified organism: Principal | ICD-10-CM | POA: Diagnosis present

## 2015-11-13 DIAGNOSIS — Z833 Family history of diabetes mellitus: Secondary | ICD-10-CM

## 2015-11-13 DIAGNOSIS — E039 Hypothyroidism, unspecified: Secondary | ICD-10-CM | POA: Diagnosis present

## 2015-11-13 DIAGNOSIS — E872 Acidosis: Secondary | ICD-10-CM | POA: Diagnosis present

## 2015-11-13 DIAGNOSIS — I2510 Atherosclerotic heart disease of native coronary artery without angina pectoris: Secondary | ICD-10-CM | POA: Diagnosis present

## 2015-11-13 DIAGNOSIS — I5023 Acute on chronic systolic (congestive) heart failure: Secondary | ICD-10-CM | POA: Diagnosis present

## 2015-11-13 DIAGNOSIS — E1122 Type 2 diabetes mellitus with diabetic chronic kidney disease: Secondary | ICD-10-CM | POA: Diagnosis present

## 2015-11-13 DIAGNOSIS — I248 Other forms of acute ischemic heart disease: Secondary | ICD-10-CM | POA: Diagnosis present

## 2015-11-13 DIAGNOSIS — I959 Hypotension, unspecified: Secondary | ICD-10-CM | POA: Diagnosis present

## 2015-11-13 DIAGNOSIS — Z79899 Other long term (current) drug therapy: Secondary | ICD-10-CM

## 2015-11-13 DIAGNOSIS — Z7982 Long term (current) use of aspirin: Secondary | ICD-10-CM

## 2015-11-13 DIAGNOSIS — J9601 Acute respiratory failure with hypoxia: Secondary | ICD-10-CM | POA: Diagnosis present

## 2015-11-13 DIAGNOSIS — M6281 Muscle weakness (generalized): Secondary | ICD-10-CM

## 2015-11-13 DIAGNOSIS — I639 Cerebral infarction, unspecified: Secondary | ICD-10-CM | POA: Diagnosis present

## 2015-11-13 DIAGNOSIS — M419 Scoliosis, unspecified: Secondary | ICD-10-CM | POA: Diagnosis present

## 2015-11-13 DIAGNOSIS — Z90710 Acquired absence of both cervix and uterus: Secondary | ICD-10-CM

## 2015-11-13 DIAGNOSIS — E118 Type 2 diabetes mellitus with unspecified complications: Secondary | ICD-10-CM

## 2015-11-13 DIAGNOSIS — N189 Chronic kidney disease, unspecified: Secondary | ICD-10-CM

## 2015-11-13 DIAGNOSIS — Z66 Do not resuscitate: Secondary | ICD-10-CM | POA: Diagnosis present

## 2015-11-13 DIAGNOSIS — R0600 Dyspnea, unspecified: Secondary | ICD-10-CM | POA: Diagnosis not present

## 2015-11-13 DIAGNOSIS — I130 Hypertensive heart and chronic kidney disease with heart failure and stage 1 through stage 4 chronic kidney disease, or unspecified chronic kidney disease: Secondary | ICD-10-CM | POA: Diagnosis present

## 2015-11-13 DIAGNOSIS — D649 Anemia, unspecified: Secondary | ICD-10-CM | POA: Diagnosis present

## 2015-11-13 DIAGNOSIS — R6520 Severe sepsis without septic shock: Secondary | ICD-10-CM | POA: Diagnosis present

## 2015-11-13 DIAGNOSIS — Z993 Dependence on wheelchair: Secondary | ICD-10-CM

## 2015-11-13 DIAGNOSIS — G609 Hereditary and idiopathic neuropathy, unspecified: Secondary | ICD-10-CM | POA: Diagnosis present

## 2015-11-13 DIAGNOSIS — Z7984 Long term (current) use of oral hypoglycemic drugs: Secondary | ICD-10-CM

## 2015-11-13 DIAGNOSIS — E1142 Type 2 diabetes mellitus with diabetic polyneuropathy: Secondary | ICD-10-CM | POA: Diagnosis present

## 2015-11-13 DIAGNOSIS — S9032XA Contusion of left foot, initial encounter: Secondary | ICD-10-CM

## 2015-11-13 DIAGNOSIS — J690 Pneumonitis due to inhalation of food and vomit: Secondary | ICD-10-CM | POA: Diagnosis present

## 2015-11-13 LAB — CBC WITH DIFFERENTIAL/PLATELET
BASOS ABS: 0 10*3/uL (ref 0.0–0.1)
Basophils Relative: 0 %
EOS PCT: 0 %
Eosinophils Absolute: 0 10*3/uL (ref 0.0–0.7)
HEMATOCRIT: 35.5 % — AB (ref 36.0–46.0)
Hemoglobin: 11.3 g/dL — ABNORMAL LOW (ref 12.0–15.0)
Lymphocytes Relative: 6 %
Lymphs Abs: 1.1 10*3/uL (ref 0.7–4.0)
MCH: 31.1 pg (ref 26.0–34.0)
MCHC: 31.8 g/dL (ref 30.0–36.0)
MCV: 97.8 fL (ref 78.0–100.0)
MONO ABS: 0.6 10*3/uL (ref 0.1–1.0)
MONOS PCT: 3 %
NEUTROS PCT: 91 %
Neutro Abs: 16.9 10*3/uL — ABNORMAL HIGH (ref 1.7–7.7)
PLATELETS: 274 10*3/uL (ref 150–400)
RBC: 3.63 MIL/uL — AB (ref 3.87–5.11)
RDW: 14.6 % (ref 11.5–15.5)
WBC: 18.6 10*3/uL — AB (ref 4.0–10.5)

## 2015-11-13 LAB — URINALYSIS, ROUTINE W REFLEX MICROSCOPIC
Bilirubin Urine: NEGATIVE
GLUCOSE, UA: NEGATIVE mg/dL
KETONES UR: 15 mg/dL — AB
NITRITE: NEGATIVE
PROTEIN: 100 mg/dL — AB
Specific Gravity, Urine: 1.025 (ref 1.005–1.030)
pH: 5 (ref 5.0–8.0)

## 2015-11-13 LAB — CBG MONITORING, ED
Glucose-Capillary: 244 mg/dL — ABNORMAL HIGH (ref 65–99)
Glucose-Capillary: 180 mg/dL — ABNORMAL HIGH (ref 65–99)

## 2015-11-13 LAB — COMPREHENSIVE METABOLIC PANEL
ALT: 13 U/L — ABNORMAL LOW (ref 14–54)
AST: 20 U/L (ref 15–41)
Albumin: 3.2 g/dL — ABNORMAL LOW (ref 3.5–5.0)
Alkaline Phosphatase: 96 U/L (ref 38–126)
Anion gap: 10 (ref 5–15)
BUN: 54 mg/dL — ABNORMAL HIGH (ref 6–20)
CHLORIDE: 113 mmol/L — AB (ref 101–111)
CO2: 18 mmol/L — ABNORMAL LOW (ref 22–32)
Calcium: 9.9 mg/dL (ref 8.9–10.3)
Creatinine, Ser: 2.67 mg/dL — ABNORMAL HIGH (ref 0.44–1.00)
GFR calc Af Amer: 16 mL/min — ABNORMAL LOW (ref >60)
GFR, EST NON AFRICAN AMERICAN: 14 mL/min — AB (ref >60)
Glucose, Bld: 244 mg/dL — ABNORMAL HIGH (ref 65–99)
POTASSIUM: 4.8 mmol/L (ref 3.5–5.1)
Sodium: 141 mmol/L (ref 135–145)
TOTAL PROTEIN: 6.3 g/dL — AB (ref 6.5–8.1)
Total Bilirubin: 0.7 mg/dL (ref 0.3–1.2)

## 2015-11-13 LAB — GLUCOSE, CAPILLARY
GLUCOSE-CAPILLARY: 124 mg/dL — AB (ref 65–99)
Glucose-Capillary: 159 mg/dL — ABNORMAL HIGH (ref 65–99)

## 2015-11-13 LAB — STREP PNEUMONIAE URINARY ANTIGEN: STREP PNEUMO URINARY ANTIGEN: NEGATIVE

## 2015-11-13 LAB — URINE MICROSCOPIC-ADD ON

## 2015-11-13 LAB — I-STAT CG4 LACTIC ACID, ED
LACTIC ACID, VENOUS: 1.86 mmol/L (ref 0.5–1.9)
Lactic Acid, Venous: 1.68 mmol/L (ref 0.5–1.9)

## 2015-11-13 MED ORDER — ACETAMINOPHEN 650 MG RE SUPP: 650.0000 mg | Freq: Four times a day (QID) | RECTAL | Status: DC | PRN

## 2015-11-13 MED ORDER — VANCOMYCIN HCL 10 G IV SOLR
1500.0000 mg | Freq: Once | INTRAVENOUS | Status: AC
  Administered 2015-11-13: 1500 mg via INTRAVENOUS
  Filled 2015-11-13 (×2): qty 1500

## 2015-11-13 MED ORDER — TRAZODONE HCL 50 MG PO TABS: 25.0000 mg | ORAL_TABLET | Freq: Every evening | ORAL | Status: DC | PRN

## 2015-11-13 MED ORDER — BISACODYL 5 MG PO TBEC
5.0000 mg | DELAYED_RELEASE_TABLET | Freq: Every day | ORAL | Status: DC | PRN
  Filled 2015-11-13: qty 1

## 2015-11-13 MED ORDER — SODIUM CHLORIDE 0.9 % IV BOLUS (SEPSIS)
500.0000 mL | Freq: Once | INTRAVENOUS | Status: AC
  Administered 2015-11-13: 500 mL via INTRAVENOUS

## 2015-11-13 MED ORDER — PREGABALIN 75 MG PO CAPS
75.0000 mg | ORAL_CAPSULE | Freq: Two times a day (BID) | ORAL | Status: DC
  Administered 2015-11-14 – 2015-11-21 (×15): 75 mg via ORAL
  Filled 2015-11-13 (×5): qty 1
  Filled 2015-11-13: qty 3
  Filled 2015-11-13 (×7): qty 1
  Filled 2015-11-13: qty 3
  Filled 2015-11-13 (×2): qty 1

## 2015-11-13 MED ORDER — SODIUM CHLORIDE 0.9 % IV BOLUS (SEPSIS)
250.0000 mL | Freq: Once | INTRAVENOUS | Status: AC
Start: 2015-11-13 — End: 2015-11-13
  Administered 2015-11-13: 250 mL via INTRAVENOUS

## 2015-11-13 MED ORDER — ATORVASTATIN CALCIUM 20 MG PO TABS
20.0000 mg | ORAL_TABLET | Freq: Every day | ORAL | Status: DC
  Administered 2015-11-14 – 2015-11-20 (×7): 20 mg via ORAL
  Filled 2015-11-13 (×2): qty 1
  Filled 2015-11-13: qty 2
  Filled 2015-11-13 (×4): qty 1

## 2015-11-13 MED ORDER — HEPARIN SODIUM (PORCINE) 5000 UNIT/ML IJ SOLN
5000.0000 [IU] | Freq: Three times a day (TID) | INTRAMUSCULAR | Status: DC
  Administered 2015-11-13 – 2015-11-21 (×24): 5000 [IU] via SUBCUTANEOUS
  Filled 2015-11-13 (×23): qty 1

## 2015-11-13 MED ORDER — PIPERACILLIN-TAZOBACTAM 3.375 G IVPB 30 MIN
3.3750 g | Freq: Once | INTRAVENOUS | Status: AC
  Administered 2015-11-13: 3.375 g via INTRAVENOUS
  Filled 2015-11-13: qty 50

## 2015-11-13 MED ORDER — OXYCODONE HCL 5 MG PO TABS: 5.0000 mg | ORAL_TABLET | Freq: Four times a day (QID) | ORAL | Status: DC | PRN

## 2015-11-13 MED ORDER — ONDANSETRON HCL 4 MG/2ML IJ SOLN: 4.0000 mg | Freq: Four times a day (QID) | INTRAMUSCULAR | Status: DC | PRN

## 2015-11-13 MED ORDER — ACETAMINOPHEN 325 MG PO TABS
650.0000 mg | ORAL_TABLET | Freq: Four times a day (QID) | ORAL | Status: DC | PRN
  Administered 2015-11-19: 650 mg via ORAL
  Filled 2015-11-13: qty 2

## 2015-11-13 MED ORDER — PANTOPRAZOLE SODIUM 20 MG PO TBEC
20.0000 mg | DELAYED_RELEASE_TABLET | Freq: Every day | ORAL | Status: DC
  Administered 2015-11-14 – 2015-11-21 (×8): 20 mg via ORAL
  Filled 2015-11-13 (×8): qty 1

## 2015-11-13 MED ORDER — SODIUM CHLORIDE 0.9 % IV SOLN
INTRAVENOUS | Status: AC
  Administered 2015-11-13: 15:00:00 via INTRAVENOUS

## 2015-11-13 MED ORDER — PIPERACILLIN-TAZOBACTAM IN DEX 2-0.25 GM/50ML IV SOLN
2.2500 g | Freq: Four times a day (QID) | INTRAVENOUS | Status: DC
  Administered 2015-11-13 – 2015-11-15 (×9): 2.25 g via INTRAVENOUS
  Filled 2015-11-13 (×11): qty 50

## 2015-11-13 MED ORDER — LEVOTHYROXINE SODIUM 75 MCG PO TABS
75.0000 ug | ORAL_TABLET | Freq: Every day | ORAL | Status: DC
  Administered 2015-11-14 – 2015-11-18 (×5): 75 ug via ORAL
  Filled 2015-11-13 (×5): qty 1

## 2015-11-13 MED ORDER — ONDANSETRON HCL 4 MG PO TABS: 4.0000 mg | ORAL_TABLET | Freq: Four times a day (QID) | ORAL | Status: DC | PRN

## 2015-11-13 MED ORDER — SODIUM CHLORIDE 0.9 % IV BOLUS (SEPSIS)
1000.0000 mL | Freq: Once | INTRAVENOUS | Status: AC
  Administered 2015-11-13: 1000 mL via INTRAVENOUS

## 2015-11-13 MED ORDER — GUAIFENESIN ER 600 MG PO TB12
600.0000 mg | ORAL_TABLET | Freq: Two times a day (BID) | ORAL | Status: DC
  Administered 2015-11-14 – 2015-11-21 (×15): 600 mg via ORAL
  Filled 2015-11-13 (×15): qty 1

## 2015-11-13 MED ORDER — VANCOMYCIN HCL IN DEXTROSE 1-5 GM/200ML-% IV SOLN
1000.0000 mg | INTRAVENOUS | Status: DC
  Administered 2015-11-15: 1000 mg via INTRAVENOUS
  Filled 2015-11-13: qty 200

## 2015-11-13 MED ORDER — INSULIN ASPART 100 UNIT/ML ~~LOC~~ SOLN
0.0000 [IU] | Freq: Three times a day (TID) | SUBCUTANEOUS | Status: DC
  Administered 2015-11-13: 2 [IU] via SUBCUTANEOUS
  Administered 2015-11-14: 1 [IU] via SUBCUTANEOUS
  Administered 2015-11-15: 2 [IU] via SUBCUTANEOUS
  Administered 2015-11-15: 1 [IU] via SUBCUTANEOUS
  Administered 2015-11-16 (×2): 3 [IU] via SUBCUTANEOUS
  Administered 2015-11-16: 2 [IU] via SUBCUTANEOUS
  Administered 2015-11-17 (×2): 3 [IU] via SUBCUTANEOUS
  Administered 2015-11-17: 2 [IU] via SUBCUTANEOUS
  Administered 2015-11-18: 3 [IU] via SUBCUTANEOUS
  Administered 2015-11-18 – 2015-11-19 (×3): 2 [IU] via SUBCUTANEOUS

## 2015-11-13 MED ORDER — NALOXONE HCL 0.4 MG/ML IJ SOLN
INTRAMUSCULAR | Status: AC
  Filled 2015-11-13: qty 1

## 2015-11-13 MED ORDER — NALOXONE HCL 0.4 MG/ML IJ SOLN
0.4000 mg | Freq: Once | INTRAMUSCULAR | Status: AC
  Administered 2015-11-13: 0.4 mg via INTRAVENOUS

## 2015-11-13 MED ORDER — VANCOMYCIN HCL IN DEXTROSE 1-5 GM/200ML-% IV SOLN
1000.0000 mg | Freq: Once | INTRAVENOUS | Status: DC
  Filled 2015-11-13: qty 200

## 2015-11-13 MED ORDER — POLYETHYLENE GLYCOL 3350 17 G PO PACK: 17.0000 g | PACK | Freq: Every day | ORAL | Status: DC | PRN

## 2015-11-13 MED ORDER — ASPIRIN EC 81 MG PO TBEC
81.0000 mg | DELAYED_RELEASE_TABLET | Freq: Every day | ORAL | Status: DC
  Administered 2015-11-14 – 2015-11-21 (×8): 81 mg via ORAL
  Filled 2015-11-13 (×8): qty 1

## 2015-11-13 MED ORDER — SODIUM CHLORIDE 0.9 % IV SOLN
INTRAVENOUS | Status: DC
  Administered 2015-11-14 – 2015-11-18 (×4): via INTRAVENOUS

## 2015-11-13 MED ORDER — ACETAMINOPHEN 650 MG RE SUPP
650.0000 mg | Freq: Once | RECTAL | Status: AC
  Administered 2015-11-13: 650 mg via RECTAL
  Filled 2015-11-13: qty 1

## 2015-11-13 NOTE — ED Notes (Signed)
Pt placed on 4L [place] at 1605 pt tolerating well SPO2 remains at 98%

## 2015-11-13 NOTE — Consult Note (Signed)
Name: [name] MRN: [id number] DOB: 03-18-1916    ADMISSION DATE:  11/13/2015 CONSULTATION DATE:  9/19  REFERRING MD :  lockwood  CHIEF COMPLAINT:  Sepsis  BRIEF PATIENT DESCRIPTION:  This is a 82 year old female who is chronically wheel chair bound d/t spinal injury now for ~ 44yrs. Lives w/ her son. Is dependent on him for all ADLs. Was in her usual state of health until the am of 9/19 when her son found her unresponsive. EMS was called. On arrival to ER she was found to be obtunded and tachycardic. PCCM w/ normal Lactic acid but UA c/w UTI. PCCM asked to eval  SIGNIFICANT EVENTS  9/19 Admit. PCCM spoke w/ pt's son Lindley Stachnik. She is w/c bound at baseline. Does not want ACLS. Was initially d/w ER staff that he would be OK w/ short term intubation. From critical care stand-point prognosis should she require that would be poor w/ high risk of prolonged ventilation. Based on our discussion she is full DNR, conservative rx which includes: IV hydration, ABX, supportive care. No central line, pressors, bipap or acls.   STUDIES:     HISTORY OF PRESENT ILLNESS:    PAST MEDICAL HISTORY :   has a past medical history of Allergic rhinitis, cause unspecified; Anemia; Arthralgia of temporomandibular joint; Arthropathy, unspecified, site unspecified; Atrial fibrillation (HCC); Bronchitis, acute; Carpal tunnel syndrome; Chest pain, unspecified; Chronic kidney disease, unspecified (HCC); Closed dislocation, thoracic vertebra; Closed fracture of lumbar vertebra without mention of spinal cord injury; Diabetes type 2, uncontrolled (HCC); Diabetes with renal manifestations(250.4); Diastasis of muscle; Eczema; Gait disorder; Hematuria, unspecified; Hemoptysis; History of nuclear stress test (06/06/2010); Hyperlipidemia; Hyperpotassemia; Hypertension; Hypothyroid; Insomnia, unspecified; Macular degeneration (senile) of retina, unspecified; Mild memory disturbance; Myoclonus; Myoclonus; Obesity,  unspecified; Open wound of knee, leg (except thigh), and ankle, without mention of complication; Osteoporosis; Other atopic dermatitis and related conditions; Other disorder of calcium metabolism; Other disorder of calcium metabolism; Other specified idiopathic peripheral neuropathy; Personal history of fall; Personality change due to conditions classified elsewhere; Scoliosis; Seborrheic keratosis; Trigger finger (acquired); Type II or unspecified type diabetes mellitus without mention of complication, not stated as uncontrolled; Unspecified closed fracture of pelvis; Unspecified constipation; Unspecified glaucoma; Unspecified hereditary and idiopathic peripheral neuropathy; Unspecified hypertrophic and atrophic condition of skin; Unspecified pruritic disorder; Unspecified urinary incontinence; Unspecified vitamin D deficiency; Urinary tract infection, site not specified; and Varicose veins of lower extremities with inflammation.  has a past surgical history that includes Abdominal hysterectomy; Coronary artery bypass graft; Biopsy breast; Knee arthroscopy; Lumbar spine surgery; Laser laparoscopy; Right oophorectomy; Esophagogastroduodenoscopy (N/A, 08/17/2012); and Hot hemostasis (N/A, 08/17/2012). Prior to Admission medications   Medication Sig Start Date End Date Taking? Authorizing Provider  ampicillin (PRINCIPEN) 500 MG capsule Take 1 capsule (500 mg total) by mouth 3 (three) times daily. 09/11/15   Kimber Relic, MD  aspirin EC 81 MG tablet Take 1 tablet (81 mg total) by mouth daily. 10/10/12   Meredeth Ide, MD  atorvastatin (LIPITOR) 20 MG tablet Take one tablet by mouth once daily to lower cholesterol 08/01/15   Kimber Relic, MD  Blood Glucose Monitoring Suppl (CONTOUR BLOOD GLUCOSE SYSTEM) DEVI Use to test blood sugar twice daily. Dx: E11.65 03/31/14   Kimber Relic, MD  calcitonin, salmon, (MIACALCIN/FORTICAL) 200 UNIT/ACT nasal spray INSTILL 1 SPRAY INTO ALTERNATING NOSTRILS ONCE A DAY FOR  OSTEOPROSIS 06/11/15   Kimber Relic, MD  dextromethorphan-guaiFENesin [facility] DM) 30-600 MG per 12 hr tablet  Take 1 tablet by mouth every 12 (twelve) hours as needed for cough.     Historical Provider, MD  diclofenac sodium (VOLTAREN) 1 % GEL Apply 2 g topically as needed. Apply twice a day to legs 10/26/15   Kimber Relic, MD  docusate sodium 100 MG CAPS Take 100 mg by mouth 2 (two) times daily. Patient taking differently: Take 100 mg by mouth as needed.  10/10/12   Meredeth Ide, MD  FLECTOR 1.3 % PTCH APPLY 1 PATCH EVERY 12 HOURS AS NEEDED FOR PAIN 01/29/15   Kimber Relic, MD  furosemide (LASIX) 20 MG tablet take 1 tablet by mouth once daily if needed for FLUID RETENTION 06/11/15   Kimber Relic, MD  lansoprazole (PREVACID) 30 MG capsule Take one capsule by mouth once daily for stomach 09/10/15   Kimber Relic, MD  latanoprost (XALATAN) 0.005 % ophthalmic solution Place 1 drop into both eyes at bedtime.  11/17/12   Historical Provider, MD  levothyroxine (SYNTHROID, LEVOTHROID) 75 MCG tablet take 1 tablet by mouth once daily BEFORE BREAKFAST 11/09/15   Kimber Relic, MD  metFORMIN (GLUCOPHAGE) 1000 MG tablet TAKE 1 TABLET BY MOUTH TWICE A DAY TO CONTROL DIABETES 11/09/15   Kimber Relic, MD  MICROLET LANCETS MISC Use to test blood sugar twice daily 05/16/15   Kimber Relic, MD  Multiple Vitamins-Minerals (CENTRUM SILVER PO) Take 1 tablet by mouth daily.    Historical Provider, MD  NON FORMULARY Take 1 capsule by mouth 2 (two) times daily. to help with Vision    Historical Provider, MD  olmesartan (BENICAR) 20 MG tablet TAKE 1/2 TABLET BY MOUTH ONCE DAILY FOR BLOOD PRESSURE 07/17/15   Kimber Relic, MD  ONE [facility] ULTRA TEST test strip TEST TWICE A DAY AS DIRECTED 06/14/15   Kimber Relic, MD  oxyCODONE (ROXICODONE) 5 MG immediate release tablet One every 6 hours if needed for severe pain 09/11/15   Kimber Relic, MD  polyethylene glycol (MIRALAX / Ethelene Hal) packet Take 17 g by mouth daily. 10/10/12    Meredeth Ide, MD  pregabalin (LYRICA) 75 MG capsule Take one capsule by mouth twice daily as needed for pain and nerves 09/11/15   Kimber Relic, MD  saccharomyces boulardii (FLORASTOR) 250 MG capsule Take 1 capsule (250 mg total) by mouth 2 (two) times daily. 05/04/13   Tiffany L Reed, DO  tobramycin-dexamethasone [facility]) ophthalmic solution Place 1 drop into the left eye daily as needed (eye irritation).  10/15/12   Historical Provider, MD  traMADol Janean Sark) 50 MG tablet Take one tablet by mouth every 12 hours as needed for pain 06/04/15   Tiffany L Reed, DO  Trospium Chloride 60 MG CP24 Take one capsule by mouth once daily for bladder control 07/20/15   Kimber Relic, MD  vitamin C (ASCORBIC ACID) 250 MG tablet Take 250 mg by mouth daily.    Historical Provider, MD  zolpidem (AMBIEN) 5 MG tablet Take 5 mg by mouth at bedtime as needed for sleep.    Historical Provider, MD   Allergies  Allergen Reactions  . Cosopt [Dorzolamide Hcl-Timolol Mal] Other (See Comments)    Redness and burning in both eyes  . Noroxin [Norfloxacin] Swelling  . Sulfa Antibiotics Nausea And Vomiting  . Prozac [Fluoxetine Hcl] Rash    FAMILY HISTORY:  family history includes Alzheimer's disease in her sister; Cancer in her sister; Diabetes in her mother; Prostate cancer in her son. SOCIAL  HISTORY:  reports that she has never smoked. She has never used smokeless tobacco. She reports that she drinks alcohol. She reports that she does not use drugs.  REVIEW OF SYSTEMS:   Unable   SUBJECTIVE:  Lethargic  VITAL SIGNS: Temp:  [99.5 F (37.5 C)-103.8 F (39.9 C)] 99.5 F (37.5 C) (09/19 1105) Pulse Rate:  [79-110] 79 (09/19 1215) Resp:  [14-44] 17 (09/19 1215) BP: (88-121)/(43-62) 89/46 (09/19 1215) SpO2:  [97 %-100 %] 99 % (09/19 1215) Weight:  [164 lb (74.4 kg)] 164 lb (74.4 kg) (09/19 0853)  PHYSICAL EXAMINATION: General:  Chronically ill appearing white female, opens eyes to noxious stim  Neuro:  No  focal neuro def. Generalized weakness HEENT:  NCAT, no JVd  Cardiovascular:  RRR, no MRG Lungs:  Decreased bases no accessory use  Abdomen:  Soft, not tender.  Musculoskeletal:  generalized weakness Skin:  Warm and dry     CBC Recent Labs     11/13/15  0910  WBC  18.6*  HGB  11.3*  HCT  35.5*  PLT  274    Coag's No results for input(s): APTT, INR in the last 72 hours.  BMET Recent Labs     11/13/15  0910  NA  141  K  4.8  CL  113*  CO2  18*  BUN  54*  CREATININE  2.67*  GLUCOSE  244*    Electrolytes Recent Labs     11/13/15  0910  CALCIUM  9.9    Sepsis Markers No results for input(s): PROCALCITON, O2SATVEN in the last 72 hours.  Invalid input(s): LACTICACIDVEN  ABG No results for input(s): PHART, PCO2ART, PO2ART in the last 72 hours.  Liver Enzymes Recent Labs     11/13/15  0910  AST  20  ALT  13*  ALKPHOS  96  BILITOT  0.7  ALBUMIN  3.2*    Cardiac Enzymes No results for input(s): TROPONINI, PROBNP in the last 72 hours.  Glucose Recent Labs     11/13/15  0900  GLUCAP  244*    Imaging Ct Head Wo Contrast  Result Date: 11/13/2015 CLINICAL DATA:  Altered mental status, unresponsive EXAM: CT HEAD WITHOUT CONTRAST TECHNIQUE: Contiguous axial images were obtained from the base of the skull through the vertex without intravenous contrast. COMPARISON:  CT brain of 10/08/2012 FINDINGS: Brain: The ventricular system remains dilated, and there are diffusely prominent cortical sulci, consistent with diffuse atrophy. Moderate to severe small vessel ischemic change is again noted throughout the periventricular white matter. There is low-attenuation rounded structure within the anterior limb of the internal capsule on the left which may represent acute or subacute lacunar infarct. No hemorrhage, mass lesion, or other area of infarction is seen. Vascular: No vascular abnormality is seen on this unenhanced study. Skull: No calvarial abnormality is seen.  Sinuses/Orbits: There is complete opacification with expansion of the right partition of the sphenoid sinus which appears relatively unchanged compared to the prior CT. This may be due to chronic sinusitis but clinical correlation is recommended. This opacification of the right sphenoid sinus measures 51 HU which could be due to proteinaceous debris although a soft tissue lesion cannot be excluded. If further assessment is warranted, MR would be recommended. Other: [name] IMPRESSION: 1. Stable moderate atrophy and moderate to severe small vessel ischemic change throughout the periventricular white matter. 2. Small low-attenuation structure in the anterior limb of the left internal capsule not definitely seen previously. Consider acute or subacute lacunar infarct.  3. Complete opacification of the right partition of the sphenoid sinus may be due to sinusitis possibly chronic but soft tissue lesion cannot be excluded as noted above. Electronically Signed   By: Dwyane Dee M.D.   On: 11/13/2015 10:31   Dg Chest Port 1 View  Result Date: 11/13/2015 CLINICAL DATA:  Altered mental status.  Possible sepsis. EXAM: PORTABLE CHEST 1 VIEW COMPARISON:  10/07/2012 . FINDINGS: Prior CABG. Heart size stable. Bibasilar infiltrates left side greater than right noted these findings consist with pneumonia. Component basilar atelectasis noted. Small left pleural effusion. IMPRESSION: 1. Bibasilar pneumonia left side greater than right. Associated bibasilar subsegmental atelectasis. Small left pleural effusion. 2. Prior CABG.  Heart size stable.  No pulmonary venous congestion . Electronically Signed   By: Maisie Fus  Register   On: 11/13/2015 09:39     ASSESSMENT / PLAN:  Acute encephalopathy UTI Severe sepsis  Possible aspiration Pneumonia Anemia of chronic disease Acute on chronic renal failure (stage 4) Hyperchloremia NAG metabolic acidosis  Diabetes Hypothyroidism  Stable dementia  ideopathic neuropathy  Severe  deconditioning DNR/DNI status   Discussion This is a 82 year old female who is presenting w/ acute encephalopathy. Suspect that this is urinary tract infection w/ resultant sepsis and subsequent N/V which resulted in an aspiration event and hypoxic respiratory failure. We discussed goals of care w/ her son [name]. He states she would not want life support and we re-confirmed that fact. We also discussed that even short term ventilation can be complicated in this setting and that the risk for failure to wean and prolonged ventilation was high given her co-morbids and advanced age. Based on this he agreed to DNR/DNI, w/ plan to admit w/ conservative interventions.   Plan Admit to medsurg IV hydration Pan culture Empiric abx If MS does not improve consider MRI brain We confirmed DNR/DNI status She is NOT BIPAP candidate nor CVL or pressors.   Simonne Martinet ACNP-BC [facility] Pulmonary/Critical Care Pager # [phone] OR # [phone] if no answer    11/13/2015, 12:22 PM   STAFF NOTE: I, Rory Percy, MD FACP have personally reviewed patient's available data, including medical history, events of note, physical examination and test results as part of my evaluation. I have discussed with resident/NP and other care providers such as pharmacist, RN and RRT. In addition, I personally evaluated patient and elicited key findings of: 100% NRB, coarse bases, unresponsive, abdo soft, no facial droop, UTI present, asp pna likely, concern is also stroke acute causing asp?, treat ABX for urine and asp pna, hope can reduce O2 needs, [place] [facility] care, O2, however, I have had extensive discussions with family son. We discussed patients current circumstances and organ failures. We also discussed patient's prior wishes under circumstances such as this. Family has decided to NOT perform resuscitation if arrest but to continue current medical support for now. Also he does not wish ETT or heroics, continued fluids,  admit to floor, if declines then he would wish comfort care for her. Will sign off,, may need further brain stroke imaging  Sepsis - Repeat Assessment  Performed at:    11 am 9/17  Vitals     Blood pressure (!) 96/50, pulse 79, temperature 99.5 F (37.5 C), temperature source Oral, resp. rate 17, height 5\' 7"  (1.702 m), weight 74.4 kg (164 lb), SpO2 100 %.  Heart:     Regular rate and rhythm  Lungs:    Rhonchi  Capillary Refill:   <2 sec  Peripheral Pulse:   Radial pulse palpable  Skin:     Normal Color     Mcarthur Rossetti. Tyson Alias, MD, FACP Pgr: [phone] [place] [facility] 11/13/2015 2:20 PM

## 2015-11-13 NOTE — Progress Notes (Signed)
Pt admitted to the unit at 1632. Pt mental status is Ax2. Pt oriented to room, staff, and call bell. Skin is intact except where otherwise charted. Full assessment charted in [facility]. Call bell within reach. Visitor guidelines reviewed w/ pt and/or family.

## 2015-11-13 NOTE — ED Provider Notes (Addendum)
[facility]-EMERGENCY DEPT Provider Note   CSN: [id number] Arrival date & time: 11/13/15  0844     History   Chief Complaint Chief Complaint  Patient presents with  . Altered Mental Status    pt found by family to be altered this morning not responding     HPI [name] is a 82 y.o. female.  HPI  Patient presents via EMS providers. Patient is nonresponsive, level V caveat. Per report the patient was last seen yesterday, this morning, when the patient's son went to awaken her, she did not respond beyond opening her eyes minimally. EMS reports the patient had unremarkable vital signs in route, but remained unresponsive.   Past Medical History:  Diagnosis Date  . Allergic rhinitis, cause unspecified   . Anemia   . Arthralgia of temporomandibular joint   . Arthropathy, unspecified, site unspecified   . Atrial fibrillation (HCC)   . Bronchitis, acute   . Carpal tunnel syndrome   . Chest pain, unspecified   . Chronic kidney disease, unspecified (HCC)   . Closed dislocation, thoracic vertebra   . Closed fracture of lumbar vertebra without mention of spinal cord injury   . Diabetes type 2, uncontrolled (HCC)   . Diabetes with renal manifestations(250.4)   . Diastasis of muscle   . Eczema   . Gait disorder   . Hematuria, unspecified   . Hemoptysis   . History of nuclear stress test 06/06/2010   lexiscan; normal pattern of perfusion; low risk   . Hyperlipidemia   . Hyperpotassemia   . Hypertension   . Hypothyroid   . Insomnia, unspecified   . Macular degeneration (senile) of retina, unspecified   . Mild memory disturbance   . Myoclonus   . Myoclonus   . Obesity, unspecified   . Open wound of knee, leg (except thigh), and ankle, without mention of complication   . Osteoporosis   . Other atopic dermatitis and related conditions   . Other disorder of calcium metabolism   . Other disorder of calcium metabolism   . Other specified idiopathic peripheral neuropathy   .  Personal history of fall   . Personality change due to conditions classified elsewhere   . Scoliosis   . Seborrheic keratosis   . Trigger finger (acquired)   . Type II or unspecified type diabetes mellitus without mention of complication, not stated as uncontrolled   . Unspecified closed fracture of pelvis   . Unspecified constipation   . Unspecified glaucoma   . Unspecified hereditary and idiopathic peripheral neuropathy   . Unspecified hypertrophic and atrophic condition of skin   . Unspecified pruritic disorder   . Unspecified urinary incontinence   . Unspecified vitamin D deficiency   . Urinary tract infection, site not specified   . Varicose veins of lower extremities with inflammation     Patient Active Problem List   Diagnosis Date Noted  . Calcaneal bursitis (heel) 09/11/2015  . S/P CABG (coronary artery bypass graft) 04/11/2015  . Leg pain, inferior 04/03/2015  . Pain in heel 04/03/2015  . CKD stage 4 due to type 2 diabetes mellitus (HCC) 04/25/2014  . Diabetic nephropathy associated with type 2 diabetes mellitus (HCC) 04/25/2014  . Contusion of foot 01/17/2014  . Diabetic neuropathy (HCC) 09/14/2013  . Pain in joint, ankle and foot 05/11/2013  . Weakness 10/07/2012  . GI AVM (gastrointestinal arteriovenous vascular malformation) 08/17/2012  . Gastritis and gastroduodenitis 08/17/2012  . GI bleed 08/14/2012  . HTN (hypertension) 08/14/2012  .  Dementia 08/14/2012  . UTI (lower urinary tract infection) 08/14/2012  . Anemia   . Hypothyroid   . Hyperlipidemia   . Hyperpotassemia   . Paroxysmal atrial fibrillation (HCC)   . Hereditary and idiopathic peripheral neuropathy   . DM (diabetes mellitus), type 2 with renal complications (HCC)   . Hypercalcemia   . Cough 05/06/2011    Past Surgical History:  Procedure Laterality Date  . ABDOMINAL HYSTERECTOMY     1973  . BIOPSY BREAST     2000  . CORONARY ARTERY BYPASS GRAFT     Dr Tyrone Sage 04/1996  .  ESOPHAGOGASTRODUODENOSCOPY N/A 08/17/2012   Procedure: ESOPHAGOGASTRODUODENOSCOPY (EGD);  Surgeon: Hilarie Fredrickson, MD;  Location: [facility];  Service: Endoscopy;  Laterality: N/A;  . HOT HEMOSTASIS N/A 08/17/2012   Procedure: HOT HEMOSTASIS (ARGON PLASMA COAGULATION/BICAP);  Surgeon: Hilarie Fredrickson, MD;  Location: [facility];  Service: Endoscopy;  Laterality: N/A;  . KNEE ARTHROSCOPY     left Dr Thurston Hole   . LASER LAPAROSCOPY     right eye 04/2005  . LUMBAR SPINE SURGERY     Dr Newell Coral   . RIGHT OOPHORECTOMY     1950    OB History    No data available       Home Medications    Prior to Admission medications   Medication Sig Start Date End Date Taking? Authorizing Provider  ampicillin (PRINCIPEN) 500 MG capsule Take 1 capsule (500 mg total) by mouth 3 (three) times daily. 09/11/15   Kimber Relic, MD  aspirin EC 81 MG tablet Take 1 tablet (81 mg total) by mouth daily. 10/10/12   Meredeth Ide, MD  atorvastatin (LIPITOR) 20 MG tablet Take one tablet by mouth once daily to lower cholesterol 08/01/15   Kimber Relic, MD  Blood Glucose Monitoring Suppl (CONTOUR BLOOD GLUCOSE SYSTEM) DEVI Use to test blood sugar twice daily. Dx: E11.65 03/31/14   Kimber Relic, MD  calcitonin, salmon, (MIACALCIN/FORTICAL) 200 UNIT/ACT nasal spray INSTILL 1 SPRAY INTO ALTERNATING NOSTRILS ONCE A DAY FOR OSTEOPROSIS 06/11/15   Kimber Relic, MD  dextromethorphan-guaiFENesin [facility] DM) 30-600 MG per 12 hr tablet Take 1 tablet by mouth every 12 (twelve) hours as needed for cough.     Historical Provider, MD  diclofenac sodium (VOLTAREN) 1 % GEL Apply 2 g topically as needed. Apply twice a day to legs 10/26/15   Kimber Relic, MD  docusate sodium 100 MG CAPS Take 100 mg by mouth 2 (two) times daily. Patient taking differently: Take 100 mg by mouth as needed.  10/10/12   Meredeth Ide, MD  FLECTOR 1.3 % PTCH APPLY 1 PATCH EVERY 12 HOURS AS NEEDED FOR PAIN 01/29/15   Kimber Relic, MD  furosemide (LASIX) 20 MG tablet  take 1 tablet by mouth once daily if needed for FLUID RETENTION 06/11/15   Kimber Relic, MD  lansoprazole (PREVACID) 30 MG capsule Take one capsule by mouth once daily for stomach 09/10/15   Kimber Relic, MD  latanoprost (XALATAN) 0.005 % ophthalmic solution Place 1 drop into both eyes at bedtime.  11/17/12   Historical Provider, MD  levothyroxine (SYNTHROID, LEVOTHROID) 75 MCG tablet take 1 tablet by mouth once daily BEFORE BREAKFAST 11/09/15   Kimber Relic, MD  metFORMIN (GLUCOPHAGE) 1000 MG tablet TAKE 1 TABLET BY MOUTH TWICE A DAY TO CONTROL DIABETES 11/09/15   Kimber Relic, MD  MICROLET LANCETS MISC Use to test blood sugar twice  daily 05/16/15   Kimber Relic, MD  Multiple Vitamins-Minerals (CENTRUM SILVER PO) Take 1 tablet by mouth daily.    Historical Provider, MD  NON FORMULARY Take 1 capsule by mouth 2 (two) times daily. to help with Vision    Historical Provider, MD  olmesartan (BENICAR) 20 MG tablet TAKE 1/2 TABLET BY MOUTH ONCE DAILY FOR BLOOD PRESSURE 07/17/15   Kimber Relic, MD  ONE [facility] ULTRA TEST test strip TEST TWICE A DAY AS DIRECTED 06/14/15   Kimber Relic, MD  oxyCODONE (ROXICODONE) 5 MG immediate release tablet One every 6 hours if needed for severe pain 09/11/15   Kimber Relic, MD  polyethylene glycol (MIRALAX / Ethelene Hal) packet Take 17 g by mouth daily. 10/10/12   Meredeth Ide, MD  pregabalin (LYRICA) 75 MG capsule Take one capsule by mouth twice daily as needed for pain and nerves 09/11/15   Kimber Relic, MD  saccharomyces boulardii (FLORASTOR) 250 MG capsule Take 1 capsule (250 mg total) by mouth 2 (two) times daily. 05/04/13   Tiffany L Reed, DO  tobramycin-dexamethasone [facility]) ophthalmic solution Place 1 drop into the left eye daily as needed (eye irritation).  10/15/12   Historical Provider, MD  traMADol Janean Sark) 50 MG tablet Take one tablet by mouth every 12 hours as needed for pain 06/04/15   Tiffany L Reed, DO  Trospium Chloride 60 MG CP24 Take one capsule by  mouth once daily for bladder control 07/20/15   Kimber Relic, MD  vitamin C (ASCORBIC ACID) 250 MG tablet Take 250 mg by mouth daily.    Historical Provider, MD  zolpidem (AMBIEN) 5 MG tablet Take 5 mg by mouth at bedtime as needed for sleep.    Historical Provider, MD    Family History Family History  Problem Relation Age of Onset  . Diabetes Mother   . Cancer Sister     colon  . Alzheimer's disease Sister   . Prostate cancer Son     Social History Social History  Substance Use Topics  . Smoking status: Never Smoker  . Smokeless tobacco: Never Used  . Alcohol use Yes     Comment: occasional wine     Allergies   Cosopt [dorzolamide hcl-timolol mal]; Noroxin [norfloxacin]; Sulfa antibiotics; and Prozac [fluoxetine hcl]   Review of Systems Review of Systems  Unable to perform ROS: Mental status change     Physical Exam Updated Vital Signs BP 121/60 (BP Location: Left Arm)   Pulse 110   Temp (!) 103.8 F (39.9 C) (Rectal)   Resp 24   Ht 5\' 7"  (1.702 m)   Wt 164 lb (74.4 kg)   SpO2 97%   BMI 25.69 kg/m   Physical Exam  Constitutional:  Large, unresponsive elderly female  HENT:  Head: Normocephalic and atraumatic.  Eyes: Conjunctivae and EOM are normal.  Pinpoint pupils, does not track  Cardiovascular: Tachycardia present.   Pulmonary/Chest: No stridor. Tachypnea noted. She has decreased breath sounds. She has wheezes.  Abdominal: She exhibits no distension.  Musculoskeletal: She exhibits no edema.  Neurological: She is unresponsive. No cranial nerve deficit.  Skin: Skin is warm and dry.  Psychiatric: Cognition and memory are impaired. She is noncommunicative.  Nursing note and vitals reviewed.    ED Treatments / Results  Labs (all labs ordered are listed, but only abnormal results are displayed) Labs Reviewed  COMPREHENSIVE METABOLIC PANEL - Abnormal; Notable for the following:       Result  Value   Chloride 113 (*)    CO2 18 (*)    Glucose, Bld  244 (*)    BUN 54 (*)    Creatinine, Ser 2.67 (*)    Total Protein 6.3 (*)    Albumin 3.2 (*)    ALT 13 (*)    GFR calc non Af Amer 14 (*)    GFR calc Af Amer 16 (*)    All other components within normal limits  CBC WITH DIFFERENTIAL/PLATELET - Abnormal; Notable for the following:    WBC 18.6 (*)    RBC 3.63 (*)    Hemoglobin 11.3 (*)    HCT 35.5 (*)    All other components within normal limits  CBG MONITORING, ED - Abnormal; Notable for the following:    Glucose-Capillary 244 (*)    All other components within normal limits  CULTURE, BLOOD (ROUTINE X 2)  CULTURE, BLOOD (ROUTINE X 2)  URINE CULTURE  URINALYSIS, ROUTINE W REFLEX MICROSCOPIC (NOT AT [facility])  I-STAT CG4 LACTIC ACID, ED    EKG  EKG Interpretation  Date/Time:  Tuesday November 13 2015 08:56:55 EDT Ventricular Rate:  110 PR Interval:    QRS Duration: 121 QT Interval:  331 QTC Calculation: 448 R Axis:   -31 Text Interpretation:  Sinus tachycardia Ventricular premature complex Right bundle branch block Abnormal ekg Confirmed by Gerhard Munch  MD ([id number]) on 11/13/2015 9:29:14 AM      Cardiac: 110 - st, abnormal   Radiology Dg Chest Port 1 View  Result Date: 11/13/2015 CLINICAL DATA:  Altered mental status.  Possible sepsis. EXAM: PORTABLE CHEST 1 VIEW COMPARISON:  10/07/2012 . FINDINGS: Prior CABG. Heart size stable. Bibasilar infiltrates left side greater than right noted these findings consist with pneumonia. Component basilar atelectasis noted. Small left pleural effusion. IMPRESSION: 1. Bibasilar pneumonia left side greater than right. Associated bibasilar subsegmental atelectasis. Small left pleural effusion. 2. Prior CABG.  Heart size stable.  No pulmonary venous congestion . Electronically Signed   By: Maisie Fus  Register   On: 11/13/2015 09:39    Procedures Procedures (including critical care time)  Medications Ordered in ED Medications  sodium chloride 0.9 % bolus 1,000 mL (1,000 mLs Intravenous New  Bag/Given 11/13/15 0920)    And  sodium chloride 0.9 % bolus 1,000 mL (not administered)    And  sodium chloride 0.9 % bolus 250 mL (not administered)  piperacillin-tazobactam (ZOSYN) IVPB 3.375 g (not administered)  acetaminophen (TYLENOL) suppository 650 mg (not administered)  vancomycin (VANCOCIN) 1,500 mg in sodium chloride 0.9 % 500 mL IVPB (not administered)  naloxone (NARCAN) injection 0.4 mg (0.4 mg Intravenous Given 11/13/15 0900)     Initial Impression / Assessment and Plan / ED Course  I have reviewed the triage vital signs and the nursing notes.  Pertinent labs & imaging results that were available during my care of the patient were reviewed by me and considered in my medical decision making (see chart for details).  Clinical Course    Patient's son arrives after initial interventions with the patient. He corroborates the story of patient being essentially normal yesterday, but not awakening today. He states the patient typically gets urinary tract infections.  We reviewed the patient's CODE STATUS, listed as DO NOT RESUSCITATE since last uptake, 2014.  Initial studies notable for bilateral pneumonia, leukocytosis.   On repeat exam the patient's blood pressure is declining, in spite of appropriate fluid resuscitation. Patient has received empiric antibiotics. Initial results discussed with the patient's  son, and family friend, Engineer, civil (consulting)nurse. We discussed bilateral pneumonia, need for admission, and again discussed resuscitation goals.  Final Clinical Impressions(s) / ED Diagnoses  AMS Sepsis Pneumonia   Elderly female presents with altered mental status. Here the patient is found be febrile, tachycardic, tachypneic, altered. After initial intervention with Narcan only resulted in change in pupil size, the patient had ongoing fluid resuscitation, empiric antibiotics. Labs, x-rays, consistent with bilateral pneumonia. After lengthy conversations with the patient's family,  friends, patient's prior DO NOT RESUSCITATE status was acknowledged, and the patient will continue to receive aggressive medical management, but will not receive intubation.  CRITICAL CARE Performed by: Gerhard MunchLOCKWOOD, Taisha Pennebaker Total critical care time: 45 minutes Critical care time was exclusive of separately billable procedures and treating other patients. Critical care was necessary to treat or prevent imminent or life-threatening deterioration. Critical care was time spent personally by me on the following activities: development of treatment plan with patient and/or surrogate as well as nursing, discussions with consultants, evaluation of patient's response to treatment, examination of patient, obtaining history from patient or surrogate, ordering and performing treatments and interventions, ordering and review of laboratory studies, ordering and review of radiographic studies, pulse oximetry and re-evaluation of patient's condition.  After discussion with our critical care colleagues, the patient was admitted to the hospitalist service for further monitoring, management.   Gerhard Munchobert Douglas Rooks, MD 11/13/15 1159    Gerhard Munchobert Umberto Pavek, MD 11/13/15 1159

## 2015-11-13 NOTE — Progress Notes (Signed)
Pharmacy Antibiotic Note  [name] is a 82 y.o. female admitted on 11/13/2015 with sepsis.  Pharmacy has been consulted for vancomycin and zosyn dosing. Pt with Tmax 103.8 and WBC elevtaed at 18.6. SCr is also above baseline and lactic is <2.   Plan: - Vanc 1500mg  IV x 1 then 1gm IV Q48H - Zosyn 3.375gm IV x 1 then 2.25gm IV Q6H - F/u renal fxn, C&S, clinical status and trough at SS  Height: 5\' 7"  (170.2 cm) Weight: 164 lb (74.4 kg) IBW/kg (Calculated) : 61.6  Temp (24hrs), Avg:103.8 F (39.9 C), Min:103.8 F (39.9 C), Max:103.8 F (39.9 C)   Recent Labs Lab 11/13/15 0910 11/13/15 0930  WBC 18.6*  --   CREATININE 2.67*  --   LATICACIDVEN  --  1.86    Estimated Creatinine Clearance: 12.7 mL/min (by C-G formula based on SCr of 2.67 mg/dL (H)).    Allergies  Allergen Reactions  . Cosopt [Dorzolamide Hcl-Timolol Mal] Other (See Comments)    Redness and burning in both eyes  . Noroxin [Norfloxacin] Swelling  . Sulfa Antibiotics Nausea And Vomiting  . Prozac [Fluoxetine Hcl] Rash    Antimicrobials this admission: Vanc 9/19>> Zosyn 9/19>>  Dose adjustments this admission: N/A  Microbiology results: Pending  Thank you for allowing pharmacy to be a part of this patient's care.  [name], Drake LeachRachel [name] 11/13/2015 10:00 AM

## 2015-11-13 NOTE — ED Notes (Addendum)
Pt brought over to Ct on monitor with this RN appears in no distress family updated on POC when returned from CT at 1005 pt placed back on monitor IVF infusing will continue to monitor

## 2015-11-13 NOTE — ED Notes (Signed)
Critical care at bedside  

## 2015-11-13 NOTE — ED Notes (Signed)
Admitting NP made aware of lactic acid result of 1.68.

## 2015-11-13 NOTE — H&P (Signed)
History and Physical    [name] UXL:[id number] DOB: May 05, 1916 DOA: 11/13/2015  PCP: Murray Hodgkins, MD  Patient coming from:   Home   Chief Complaint:  altered mental status, shortness of breath  HPI: [name] is a 82 y.o. female with a medical history significant for, but not  limited to, atrial fibrillation, hypertension, chronic kidney disease stage IV, and diabetes. Patient unable to provide history secondary to encephalopathy. Patient lives with her son at home who is her primary caretaker. She is dependent on him for most of her ADLs. This morning son was unable to get mother out of bed. Patient was nearly unresponsive, breathing was labored. Patient had not been coughing at home nor complaining of any chest pain or shortness of breath. She did seem unusually tired yesterday, slept in a recliner a lot.  ED Course:  Temp  103.8, heart rate 79-110, respirations up to 44, BP 88/46 up to 121/60. O2 saturation 99% on mask. Patient received 3 L of IV fluid along with Zosyn and vancomycin. She received a dose of Narcan around 9 AM.  Review of Systems:  Unable to obtain. Patient lethargic, some confusion  Past Medical History:  Diagnosis Date  . Allergic rhinitis, cause unspecified   . Anemia   . Arthralgia of temporomandibular joint   . Arthropathy, unspecified, site unspecified   . Atrial fibrillation (HCC)   . Bronchitis, acute   . Carpal tunnel syndrome   . Chest pain, unspecified   . Chronic kidney disease, unspecified (HCC)   . Closed dislocation, thoracic vertebra   . Closed fracture of lumbar vertebra without mention of spinal cord injury   . Diabetes type 2, uncontrolled (HCC)   . Diabetes with renal manifestations(250.4)   . Diastasis of muscle   . Eczema   . Gait disorder   . Hemoptysis   . History of nuclear stress test 06/06/2010   lexiscan; normal pattern of perfusion; low risk   . Hyperlipidemia   . Hyperpotassemia   . Hypertension   . Hypothyroid     . Insomnia, unspecified   . Macular degeneration (senile) of retina, unspecified   . Mild memory disturbance   . Myoclonus   . Obesity, unspecified   . Open wound of knee, leg (except thigh), and ankle, without mention of complication   . Osteoporosis   . Other atopic dermatitis and related conditions   . Other disorder of calcium metabolism   . Other specified idiopathic peripheral neuropathy   . Personal history of fall   . Personality change due to conditions classified elsewhere   . Scoliosis   . Seborrheic keratosis   . Trigger finger (acquired)   . Type II or unspecified type diabetes mellitus without mention of complication, not stated as uncontrolled   . Unspecified closed fracture of pelvis   . Unspecified constipation   . Unspecified glaucoma   . Unspecified hereditary and idiopathic peripheral neuropathy   . Unspecified hypertrophic and atrophic condition of skin   . Unspecified pruritic disorder   . Unspecified urinary incontinence   . Unspecified vitamin D deficiency   . Urinary tract infection, site not specified   . Varicose veins of lower extremities with inflammation     Past Surgical History:  Procedure Laterality Date  . ABDOMINAL HYSTERECTOMY     1973  . BIOPSY BREAST     2000  . CORONARY ARTERY BYPASS GRAFT     Dr Tyrone Sage 04/1996  . ESOPHAGOGASTRODUODENOSCOPY N/A  08/17/2012   Procedure: ESOPHAGOGASTRODUODENOSCOPY (EGD);  Surgeon: Hilarie Fredrickson, MD;  Location: [facility];  Service: Endoscopy;  Laterality: N/A;  . HOT HEMOSTASIS N/A 08/17/2012   Procedure: HOT HEMOSTASIS (ARGON PLASMA COAGULATION/BICAP);  Surgeon: Hilarie Fredrickson, MD;  Location: [facility];  Service: Endoscopy;  Laterality: N/A;  . KNEE ARTHROSCOPY     left Dr Thurston Hole   . LASER LAPAROSCOPY     right eye 04/2005  . LUMBAR SPINE SURGERY     Dr Newell Coral   . RIGHT OOPHORECTOMY     1950    Social History   Social History  . Marital status: Widowed    Spouse name: N/A  . Number of  children: 3  . Years of education: N/A   Occupational History  . Retired Retired    retired Nurse, learning disability   Social History Main Topics  . Smoking status: Never Smoker  . Smokeless tobacco: Never Used  . Alcohol use Yes     Comment: occasional wine  . Drug use: No  . Sexual activity: No   Other Topics Concern  . Not on file   Social History Narrative   Lives with son Chester Holstein   Never smoked   Alcohol none   Lives at home with her son who is primary caretaker. Patient basically bedbound. She can transfer from bed to chair with help of son Allergies  Allergen Reactions  . Cosopt [Dorzolamide Hcl-Timolol Mal] Other (See Comments)    Redness and burning in both eyes  . Noroxin [Norfloxacin] Swelling  . Sulfa Antibiotics Nausea And Vomiting  . Prozac [Fluoxetine Hcl] Rash    Family History  Problem Relation Age of Onset  . Diabetes Mother   . Cancer Sister     colon  . Alzheimer's disease Sister   . Prostate cancer Son     Prior to Admission medications   Medication Sig Start Date End Date Taking? Authorizing Provider  ampicillin (PRINCIPEN) 500 MG capsule Take 1 capsule (500 mg total) by mouth 3 (three) times daily. 09/11/15   Kimber Relic, MD  aspirin EC 81 MG tablet Take 1 tablet (81 mg total) by mouth daily. 10/10/12   Meredeth Ide, MD  atorvastatin (LIPITOR) 20 MG tablet Take one tablet by mouth once daily to lower cholesterol 08/01/15   Kimber Relic, MD  Blood Glucose Monitoring Suppl (CONTOUR BLOOD GLUCOSE SYSTEM) DEVI Use to test blood sugar twice daily. Dx: E11.65 03/31/14   Kimber Relic, MD  calcitonin, salmon, (MIACALCIN/FORTICAL) 200 UNIT/ACT nasal spray INSTILL 1 SPRAY INTO ALTERNATING NOSTRILS ONCE A DAY FOR OSTEOPROSIS 06/11/15   Kimber Relic, MD  dextromethorphan-guaiFENesin [facility] DM) 30-600 MG per 12 hr tablet Take 1 tablet by mouth every 12 (twelve) hours as needed for cough.     Historical Provider, MD  diclofenac sodium (VOLTAREN) 1 %  GEL Apply 2 g topically as needed. Apply twice a day to legs 10/26/15   Kimber Relic, MD  docusate sodium 100 MG CAPS Take 100 mg by mouth 2 (two) times daily. Patient taking differently: Take 100 mg by mouth as needed.  10/10/12   Meredeth Ide, MD  FLECTOR 1.3 % PTCH APPLY 1 PATCH EVERY 12 HOURS AS NEEDED FOR PAIN 01/29/15   Kimber Relic, MD  furosemide (LASIX) 20 MG tablet take 1 tablet by mouth once daily if needed for FLUID RETENTION 06/11/15   Kimber Relic, MD  lansoprazole (PREVACID) 30  MG capsule Take one capsule by mouth once daily for stomach 09/10/15   Kimber Relic, MD  latanoprost (XALATAN) 0.005 % ophthalmic solution Place 1 drop into both eyes at bedtime.  11/17/12   Historical Provider, MD  levothyroxine (SYNTHROID, LEVOTHROID) 75 MCG tablet take 1 tablet by mouth once daily BEFORE BREAKFAST 11/09/15   Kimber Relic, MD  metFORMIN (GLUCOPHAGE) 1000 MG tablet TAKE 1 TABLET BY MOUTH TWICE A DAY TO CONTROL DIABETES 11/09/15   Kimber Relic, MD  MICROLET LANCETS MISC Use to test blood sugar twice daily 05/16/15   Kimber Relic, MD  Multiple Vitamins-Minerals (CENTRUM SILVER PO) Take 1 tablet by mouth daily.    Historical Provider, MD  NON FORMULARY Take 1 capsule by mouth 2 (two) times daily. to help with Vision    Historical Provider, MD  olmesartan (BENICAR) 20 MG tablet TAKE 1/2 TABLET BY MOUTH ONCE DAILY FOR BLOOD PRESSURE 07/17/15   Kimber Relic, MD  ONE [facility] ULTRA TEST test strip TEST TWICE A DAY AS DIRECTED 06/14/15   Kimber Relic, MD  oxyCODONE (ROXICODONE) 5 MG immediate release tablet One every 6 hours if needed for severe pain 09/11/15   Kimber Relic, MD  polyethylene glycol (MIRALAX / Ethelene Hal) packet Take 17 g by mouth daily. 10/10/12   Meredeth Ide, MD  pregabalin (LYRICA) 75 MG capsule Take one capsule by mouth twice daily as needed for pain and nerves 09/11/15   Kimber Relic, MD  saccharomyces boulardii (FLORASTOR) 250 MG capsule Take 1 capsule (250 mg total) by mouth  2 (two) times daily. 05/04/13   Tiffany L Reed, DO  tobramycin-dexamethasone [facility]) ophthalmic solution Place 1 drop into the left eye daily as needed (eye irritation).  10/15/12   Historical Provider, MD  traMADol Janean Sark) 50 MG tablet Take one tablet by mouth every 12 hours as needed for pain 06/04/15   Tiffany L Reed, DO  Trospium Chloride 60 MG CP24 Take one capsule by mouth once daily for bladder control 07/20/15   Kimber Relic, MD  vitamin C (ASCORBIC ACID) 250 MG tablet Take 250 mg by mouth daily.    Historical Provider, MD  zolpidem (AMBIEN) 5 MG tablet Take 5 mg by mouth at bedtime as needed for sleep.    Historical Provider, MD    Physical Exam: Vitals:   11/13/15 1200 11/13/15 1215 11/13/15 1245 11/13/15 1300  BP: (!) 88/46 (!) 89/46 (!) 90/54 (!) 96/50  Pulse: 79 79 79 79  Resp: 14 17 20 17   Temp:      TempSrc:      SpO2: 99% 99% 99% 100%  Weight:      Height:        Constitutional:  Well developed, well nourished white female in NAD, calm, comfortable Vitals:   11/13/15 1200 11/13/15 1215 11/13/15 1245 11/13/15 1300  BP: (!) 88/46 (!) 89/46 (!) 90/54 (!) 96/50  Pulse: 79 79 79 79  Resp: 14 17 20 17   Temp:      TempSrc:      SpO2: 99% 99% 99% 100%  Weight:      Height:       Eyes: PER, lids and conjunctivae normal ENMT: Mucous membranes are dry (oxygen mask). Posterior pharynx clear of any exudate or lesions..  Neck: normal, supple, no masses Respiratory: Diminished breath sounds and crackles at both bases, no wheezing. Normal respiratory effort at present on the mask.  Cardiovascular: Regular rate and rhythm.  Mild swelling right ankle, 2+ dorsal pedis pulses.   Abdomen: soft, obese, no tenderness, no masses palpated. No hepatomegaly. Bowel sounds positive.  Musculoskeletal: no clubbing / cyanosis. Right foot turned outward. There is mild swelling of right foot and ankle. Faint bruising to back side of ankle. The foot / ankle were not in fixed position, there was  some ROM.  Skin: no rashes, lesions, ulcers.  Neurologic: Patient arousable, knows son at bedside. Answers some basic questions appropriately and tries to follow commands. Strength 3/5 in all 4.  Psychiatric: unable to fully assess. She is calm, cooperative and appropriate   Labs on Admission: I have personally reviewed following labs and imaging studies   Urine analysis:    Component Value Date/Time   COLORURINE YELLOW 11/13/2015 0927   APPEARANCEUR CLOUDY (A) 11/13/2015 0927   APPEARANCEUR Cloudy (A) 08/29/2014 1428   LABSPEC 1.025 11/13/2015 0927   PHURINE 5.0 11/13/2015 0927   GLUCOSEU NEGATIVE 11/13/2015 0927   HGBUR LARGE (A) 11/13/2015 0927   BILIRUBINUR NEGATIVE 11/13/2015 0927   BILIRUBINUR negative 12/13/2014 1217   BILIRUBINUR Negative 08/29/2014 1428   KETONESUR 15 (A) 11/13/2015 0927   PROTEINUR 100 (A) 11/13/2015 0927   UROBILINOGEN negative 12/13/2014 1217   UROBILINOGEN 0.2 07/12/2014 1251   NITRITE NEGATIVE 11/13/2015 0927   LEUKOCYTESUR LARGE (A) 11/13/2015 0927   LEUKOCYTESUR 3+ (A) 08/29/2014 1428    Radiological Exams on Admission: Ct Head Wo Contrast  Result Date: 11/13/2015 CLINICAL DATA:  Altered mental status, unresponsive EXAM: CT HEAD WITHOUT CONTRAST TECHNIQUE: Contiguous axial images were obtained from the base of the skull through the vertex without intravenous contrast. COMPARISON:  CT brain of 10/08/2012 FINDINGS: Brain: The ventricular system remains dilated, and there are diffusely prominent cortical sulci, consistent with diffuse atrophy. Moderate to severe small vessel ischemic change is again noted throughout the periventricular white matter. There is low-attenuation rounded structure within the anterior limb of the internal capsule on the left which may represent acute or subacute lacunar infarct. No hemorrhage, mass lesion, or other area of infarction is seen. Vascular: No vascular abnormality is seen on this unenhanced study. Skull: No  calvarial abnormality is seen. Sinuses/Orbits: There is complete opacification with expansion of the right partition of the sphenoid sinus which appears relatively unchanged compared to the prior CT. This may be due to chronic sinusitis but clinical correlation is recommended. This opacification of the right sphenoid sinus measures 51 HU which could be due to proteinaceous debris although a soft tissue lesion cannot be excluded. If further assessment is warranted, MR would be recommended. Other: None IMPRESSION: 1. Stable moderate atrophy and moderate to severe small vessel ischemic change throughout the periventricular white matter. 2. Small low-attenuation structure in the anterior limb of the left internal capsule not definitely seen previously. Consider acute or subacute lacunar infarct. 3. Complete opacification of the right partition of the sphenoid sinus may be due to sinusitis possibly chronic but soft tissue lesion cannot be excluded as noted above. Electronically Signed   By: Dwyane Dee M.D.   On: 11/13/2015 10:31   Dg Chest Port 1 View  Result Date: 11/13/2015 CLINICAL DATA:  Altered mental status.  Possible sepsis. EXAM: PORTABLE CHEST 1 VIEW COMPARISON:  10/07/2012 . FINDINGS: Prior CABG. Heart size stable. Bibasilar infiltrates left side greater than right noted these findings consist with pneumonia. Component basilar atelectasis noted. Small left pleural effusion. IMPRESSION: 1. Bibasilar pneumonia left side greater than right. Associated bibasilar subsegmental atelectasis. Small left pleural  effusion. 2. Prior CABG.  Heart size stable.  No pulmonary venous congestion . Electronically Signed   By: Maisie Fushomas  Register   On: 11/13/2015 09:39    EKG: Independently reviewed.   EKG Interpretation  Date/Time:  Tuesday November 13 2015 08:56:55 EDT Ventricular Rate:  110 PR Interval:    QRS Duration: 121 QT Interval:  331 QTC Calculation: 448 R Axis:   -31 Text Interpretation:  Sinus  tachycardia Ventricular premature complex Right bundle branch block Abnormal ekg Confirmed by Gerhard MunchLOCKWOOD, ROBERT  MD [phone]([id number]) on 11/13/2015 9:29:14 AM      Assessment/Plan   Principal Problem:   Sepsis (HCC) Active Problems:   UTI (lower urinary tract infection)   CAP (community acquired pneumonia)   Paroxysmal atrial fibrillation (HCC)   DM (diabetes mellitus), type 2 with renal complications (HCC)   CKD stage 4 due to type 2 diabetes mellitus (HCC)   Hypothyroid   Hyperlipidemia   HTN (hypertension)   Dementia      Sepsis (tachypnea, leukocytosis, fever, encephalopathy) secondary to CAP with resulting acute respiratory failure / hypoxia. Encephalopathy is new and possibly secondary to acute illness though head CTscan raises concern for an acute or subacute lacunar infarct.     -Admit to Medical bed -Sepsis and CAP order set utilized -follow up on blood culture, sputum gram stain and culture, and strep pneum urinary ag -Fluid resuscitation completed in ED -continue Vanco and Zosyn -Continue 02, wean from mask as tolerated -appreciate CCM assistance. Patient is DNR / DNI. She is not candidate for BIPAP nor pressors.  -If acute illness resolves without improvement in mental status will consider brain MRI for further evaluation of possible lacunar infarct.  UTI . -send urine for culture -already on broad spectrum antibiotics  AKI superimposed on CKD 4. Baseline creatinine 1.94, up to 2.67 today -Minimize nephrotoxic medications -Hopefully renal function will improve with fluid resuscitation -am bmet  Hx Atrial fibrillation, not on medication for at home. EKG shows sinus tachy with rate of 110. Chadsvasc 5. Not anticoagulated, not on  DM 2 -Hold home metformin -CBGs, SSI  Hypertension. Hypotensive with sepsis today.  -hold home BP meds until resolution of hypotension  Right ankle injury. Seem by Ortho last week And able  Hyperlipidemia. -Resume home statin if / when able to  tolerate PO                                     Hypothyroidism -Resume home Synthroid if/when able to take by mouth  DVT prophylaxis:      SQ Heparin  Code Status:      DNR Family Communication:    Treatment plan discussed with  son in the room and he understands and agrees with the plan..  Disposition Plan:   Discharge back home in 3-4 days (if patient survives)              Consults called:  CCM.  Admission status:   Admission -  Medical bed   Willette ClusterPaula Mykal Kirchman NP [facility] Pager [phone]  If 7PM-7AM, please contact night-coverage [email or web address] Password [email or web address]  11/13/2015, 1:38 PM

## 2015-11-13 NOTE — ED Notes (Signed)
Bladder scan done at bedside due to pt having 2L  NS infused with no urine noted out at this time, bladder scan shows in bladder pt appears in no distress will continue to monitor pt also appears to have improved respirations pt switched from NRB to 40% venti mask, pt tolerating well.  Palliative care hospitalist at bedside aware of bladder scan result

## 2015-11-13 NOTE — ED Notes (Signed)
Attempted to call report unable to take report at this time will call back

## 2015-11-13 NOTE — ED Triage Notes (Signed)
Pt was found this morning unresponsive pt is normally wheelchair bound and alert and talking arrives unresponsive with NRB mask and Nasal airway in place

## 2015-11-14 LAB — CBC
HCT: 29 % — ABNORMAL LOW (ref 36.0–46.0)
Hemoglobin: 8.7 g/dL — ABNORMAL LOW (ref 12.0–15.0)
MCH: 30.3 pg (ref 26.0–34.0)
MCHC: 30 g/dL (ref 30.0–36.0)
MCV: 101 fL — ABNORMAL HIGH (ref 78.0–100.0)
PLATELETS: 221 10*3/uL (ref 150–400)
RBC: 2.87 MIL/uL — ABNORMAL LOW (ref 3.87–5.11)
RDW: 15.7 % — AB (ref 11.5–15.5)
WBC: 13.6 10*3/uL — AB (ref 4.0–10.5)

## 2015-11-14 LAB — BASIC METABOLIC PANEL
Anion gap: 6 (ref 5–15)
BUN: 56 mg/dL — AB (ref 6–20)
CO2: 17 mmol/L — ABNORMAL LOW (ref 22–32)
CREATININE: 2.48 mg/dL — AB (ref 0.44–1.00)
Calcium: 8.4 mg/dL — ABNORMAL LOW (ref 8.9–10.3)
Chloride: 118 mmol/L — ABNORMAL HIGH (ref 101–111)
GFR calc Af Amer: 18 mL/min — ABNORMAL LOW (ref >60)
GFR, EST NON AFRICAN AMERICAN: 15 mL/min — AB (ref >60)
Glucose, Bld: 118 mg/dL — ABNORMAL HIGH (ref 65–99)
POTASSIUM: 4.3 mmol/L (ref 3.5–5.1)
SODIUM: 141 mmol/L (ref 135–145)

## 2015-11-14 LAB — URINE CULTURE

## 2015-11-14 LAB — GLUCOSE, CAPILLARY
GLUCOSE-CAPILLARY: 107 mg/dL — AB (ref 65–99)
GLUCOSE-CAPILLARY: 129 mg/dL — AB (ref 65–99)
GLUCOSE-CAPILLARY: 122 mg/dL — AB (ref 65–99)
Glucose-Capillary: 110 mg/dL — ABNORMAL HIGH (ref 65–99)

## 2015-11-14 LAB — HIV ANTIBODY (ROUTINE TESTING W REFLEX): HIV SCREEN 4TH GENERATION: NONREACTIVE

## 2015-11-14 NOTE — Progress Notes (Addendum)
PROGRESS NOTE    Tammy QuamWinifred D Boling  ZOX:[id number] DOB: 21-Mar-1916 DOA: 11/13/2015 PCP: Murray HodgkinsArthur Green, MD Brief Narrative: [name] is a 82 y.o. female with a medical history significant for, but not  limited to, atrial fibrillation, hypertension, chronic kidney disease stage IV, and diabetes. Patient unable to provide history secondary to encephalopathy. Patient lives with her son at home who is her primary caretaker. She is dependent on him for most of her ADLs. 9/19 morning son was unable to get mother out of bed. Patient was nearly unresponsive, breathing was labored previous day was sleepy all day. In the emergency room found to have a temp of 103.8, tachycardic, tachypneic, hypoxic and borderline hypotensive  Assessment & Plan: 1. Severe sepsis -due to aspiration pneumonia vs UTI -clinically improving on IV VAnc/Zosyn -continue IVF, lactate improved -FU Blood and urine Cx  2. Possible subacute CVA on CT head -I discussed with patient and son, they do not want to pursue a stroke work and this will not significantly change management at age 82 -hence appropriately defered stroke workup at this time -continue ASA/statin, will check SLP/OT/PT -On exam she has mild pronator drift of right upper extremity  3. AK I on CKD4 -Baseline creatinine of 1.9 this was 2.6 on admission, -Improving, continue IV fluids, -Monitor bmet and urine output  4. Diabetes mellitus -Hold metformin, continue sliding scale insulin  5. History of paroxysmal atrial fibrillation -Not on anticoagulation due to advanced age, fall risk, her [place] to rest score is 5 -Continue aspirin  6. Chronic debility/ long-standing bilateral lower extremity weakness -Disabled and wheelchair bound at baseline for over 5 years   DVT prophylaxis:      SQ Heparin  Code Status:      DNR Family Communication: Discussed with son at bedside  Disposition Plan:   Discharge back home in a few days if remained stable    Antimicrobials:Vanc/Zosyn 9/19->  Subjective: Doing much better  Objective: Vitals:   11/13/15 2226 11/14/15 0546 11/14/15 0548 11/14/15 0549  BP: (!) 80/52 (!) 182/120 (!) 94/44 (!) 93/43  Pulse:  62 63 63  Resp:  19 17   Temp:   98.1 F (36.7 C)   TempSrc:      SpO2:  100% 100%   Weight:    76.9 kg (169 lb 9.6 oz)  Height:        Intake/Output Summary (Last 24 hours) at 11/14/15 1414 Last data filed at 11/14/15 1235  Gross per 24 hour  Intake              770 ml  Output              875 ml  Net             -105 ml   Filed Weights   11/13/15 0853 11/14/15 0549  Weight: 74.4 kg (164 lb) 76.9 kg (169 lb 9.6 oz)    Examination:  General exam: Appears calm and comfortable  Respiratory system: Clear to auscultation. Respiratory effort normal. Cardiovascular system: S1 & S2 heard, RRR. No JVD, murmurs, rubs, gallops or clicks. No pedal edema. Gastrointestinal system: Abdomen is nondistended, soft and nontender. No organomegaly or masses felt. Normal bowel sounds heard. Central nervous system: Alert and oriented. No focal neurological deficits. Extremities: Symmetric 5 x 5 power. Skin: No rashes, lesions or ulcers Psychiatry: Judgement and insight appear normal. Mood & affect appropriate.     Data Reviewed: I have personally reviewed following labs and  imaging studies  CBC:  Recent Labs Lab 11/13/15 0910 11/14/15 0648  WBC 18.6* 13.6*  NEUTROABS 16.9*  --   HGB 11.3* 8.7*  HCT 35.5* 29.0*  MCV 97.8 101.0*  PLT 274 221   Basic Metabolic Panel:  Recent Labs Lab 11/13/15 0910 11/14/15 0648  NA 141 141  K 4.8 4.3  CL 113* 118*  CO2 18* 17*  GLUCOSE 244* 118*  BUN 54* 56*  CREATININE 2.67* 2.48*  CALCIUM 9.9 8.4*   GFR: Estimated Creatinine Clearance: 13.9 mL/min (by C-G formula based on SCr of 2.48 mg/dL (H)). Liver Function Tests:  Recent Labs Lab 11/13/15 0910  AST 20  ALT 13*  ALKPHOS 96  BILITOT 0.7  PROT 6.3*  ALBUMIN 3.2*   No  results for input(s): LIPASE, AMYLASE in the last 168 hours. No results for input(s): AMMONIA in the last 168 hours. Coagulation Profile: No results for input(s): INR, PROTIME in the last 168 hours. Cardiac Enzymes: No results for input(s): CKTOTAL, CKMB, CKMBINDEX, TROPONINI in the last 168 hours. BNP (last 3 results) No results for input(s): PROBNP in the last 8760 hours. HbA1C: No results for input(s): HGBA1C in the last 72 hours. CBG:  Recent Labs Lab 11/13/15 1549 11/13/15 1721 11/13/15 2213 11/14/15 0808 11/14/15 1215  GLUCAP 180* 159* 124* 107* 129*   Lipid Profile: No results for input(s): CHOL, HDL, LDLCALC, TRIG, CHOLHDL, LDLDIRECT in the last 72 hours. Thyroid Function Tests: No results for input(s): TSH, T4TOTAL, FREET4, T3FREE, THYROIDAB in the last 72 hours. Anemia Panel: No results for input(s): VITAMINB12, FOLATE, FERRITIN, TIBC, IRON, RETICCTPCT in the last 72 hours. Urine analysis:    Component Value Date/Time   COLORURINE YELLOW 11/13/2015 0927   APPEARANCEUR CLOUDY (A) 11/13/2015 0927   APPEARANCEUR Cloudy (A) 08/29/2014 1428   LABSPEC 1.025 11/13/2015 0927   PHURINE 5.0 11/13/2015 0927   GLUCOSEU NEGATIVE 11/13/2015 0927   HGBUR LARGE (A) 11/13/2015 0927   BILIRUBINUR NEGATIVE 11/13/2015 0927   BILIRUBINUR negative 12/13/2014 1217   BILIRUBINUR Negative 08/29/2014 1428   KETONESUR 15 (A) 11/13/2015 0927   PROTEINUR 100 (A) 11/13/2015 0927   UROBILINOGEN negative 12/13/2014 1217   UROBILINOGEN 0.2 07/12/2014 1251   NITRITE NEGATIVE 11/13/2015 0927   LEUKOCYTESUR LARGE (A) 11/13/2015 0927   LEUKOCYTESUR 3+ (A) 08/29/2014 1428   Sepsis Labs: @LABRCNTIP (procalcitonin:4,lacticidven:4)  ) Recent Results (from the past 240 hour(s))  Urine culture     Status: Abnormal   Collection Time: 11/13/15  9:27 AM  Result Value Ref Range Status   Specimen Description URINE, CATHETERIZED  Final   Special Requests NONE  Final   Culture MULTIPLE SPECIES  PRESENT, SUGGEST RECOLLECTION (A)  Final   Report Status 11/14/2015 FINAL  Final         Radiology Studies: Ct Head Wo Contrast  Result Date: 11/13/2015 CLINICAL DATA:  Altered mental status, unresponsive EXAM: CT HEAD WITHOUT CONTRAST TECHNIQUE: Contiguous axial images were obtained from the base of the skull through the vertex without intravenous contrast. COMPARISON:  CT brain of 10/08/2012 FINDINGS: Brain: The ventricular system remains dilated, and there are diffusely prominent cortical sulci, consistent with diffuse atrophy. Moderate to severe small vessel ischemic change is again noted throughout the periventricular white matter. There is low-attenuation rounded structure within the anterior limb of the internal capsule on the left which may represent acute or subacute lacunar infarct. No hemorrhage, mass lesion, or other area of infarction is seen. Vascular: No vascular abnormality is seen on this unenhanced  study. Skull: No calvarial abnormality is seen. Sinuses/Orbits: There is complete opacification with expansion of the right partition of the sphenoid sinus which appears relatively unchanged compared to the prior CT. This may be due to chronic sinusitis but clinical correlation is recommended. This opacification of the right sphenoid sinus measures 51 HU which could be due to proteinaceous debris although a soft tissue lesion cannot be excluded. If further assessment is warranted, MR would be recommended. Other: None IMPRESSION: 1. Stable moderate atrophy and moderate to severe small vessel ischemic change throughout the periventricular white matter. 2. Small low-attenuation structure in the anterior limb of the left internal capsule not definitely seen previously. Consider acute or subacute lacunar infarct. 3. Complete opacification of the right partition of the sphenoid sinus may be due to sinusitis possibly chronic but soft tissue lesion cannot be excluded as noted above. Electronically  Signed   By: Dwyane Dee M.D.   On: 11/13/2015 10:31   Dg Chest Port 1 View  Result Date: 11/13/2015 CLINICAL DATA:  Altered mental status.  Possible sepsis. EXAM: PORTABLE CHEST 1 VIEW COMPARISON:  10/07/2012 . FINDINGS: Prior CABG. Heart size stable. Bibasilar infiltrates left side greater than right noted these findings consist with pneumonia. Component basilar atelectasis noted. Small left pleural effusion. IMPRESSION: 1. Bibasilar pneumonia left side greater than right. Associated bibasilar subsegmental atelectasis. Small left pleural effusion. 2. Prior CABG.  Heart size stable.  No pulmonary venous congestion . Electronically Signed   By: Maisie Fus  Register   On: 11/13/2015 09:39        Scheduled Meds: . aspirin EC  81 mg Oral Daily  . atorvastatin  20 mg Oral q1800  . guaiFENesin  600 mg Oral BID  . heparin  5,000 Units Subcutaneous Q8H  . insulin aspart  0-9 Units Subcutaneous TID WC  . levothyroxine  75 mcg Oral QAC breakfast  . pantoprazole  20 mg Oral Daily  . piperacillin-tazobactam (ZOSYN)  IV  2.25 g Intravenous Q6H  . pregabalin  75 mg Oral BID  . [START ON 11/15/2015] vancomycin  1,000 mg Intravenous Q48H   Continuous Infusions: . sodium chloride 50 mL/hr at 11/14/15 0100     LOS: 1 day    Time spent:    Zannie Cove, MD [facility] Pager [phone]  If 7PM-7AM, please contact night-coverage [email or web address] Password [email or web address] 11/14/2015, 2:14 PM

## 2015-11-14 NOTE — Care Management Note (Signed)
Case Management Note  Patient Details  Name: [name] MRN: [id number] Date of Birth: 27-Jan-1917  Subjective/Objective:                 Patient admitted from home, lives with son who is her caretaker. Bilateral PNA, IV Abx, supplemental O2, IVF, AMS on admission now improving.    Action/Plan:  CM will continue to follow for DC planning.   Expected Discharge Date:                  Expected Discharge Plan:  Home w Home Health Services  In-House Referral:     Discharge planning Services  CM Consult  Post Acute Care Choice:    Choice offered to:     DME Arranged:    DME Agency:     HH Arranged:    HH Agency:     Status of Service:  In process, will continue to follow  If discussed at Long Length of Stay Meetings, dates discussed:    Additional Comments:  Tammy SabalDebbie Francheska Villeda, RN 11/14/2015, 2:08 PM

## 2015-11-15 DIAGNOSIS — A419 Sepsis, unspecified organism: Secondary | ICD-10-CM

## 2015-11-15 DIAGNOSIS — N189 Chronic kidney disease, unspecified: Secondary | ICD-10-CM

## 2015-11-15 DIAGNOSIS — J690 Pneumonitis due to inhalation of food and vomit: Secondary | ICD-10-CM

## 2015-11-15 DIAGNOSIS — N289 Disorder of kidney and ureter, unspecified: Secondary | ICD-10-CM

## 2015-11-15 DIAGNOSIS — R6520 Severe sepsis without septic shock: Secondary | ICD-10-CM

## 2015-11-15 DIAGNOSIS — E118 Type 2 diabetes mellitus with unspecified complications: Secondary | ICD-10-CM

## 2015-11-15 LAB — BASIC METABOLIC PANEL
Anion gap: 9 (ref 5–15)
BUN: 37 mg/dL — ABNORMAL HIGH (ref 6–20)
CALCIUM: 9.3 mg/dL (ref 8.9–10.3)
CO2: 18 mmol/L — ABNORMAL LOW (ref 22–32)
CREATININE: 2.1 mg/dL — AB (ref 0.44–1.00)
Chloride: 113 mmol/L — ABNORMAL HIGH (ref 101–111)
GFR calc non Af Amer: 19 mL/min — ABNORMAL LOW (ref >60)
GFR, EST AFRICAN AMERICAN: 22 mL/min — AB (ref >60)
Glucose, Bld: 133 mg/dL — ABNORMAL HIGH (ref 65–99)
Potassium: 4.3 mmol/L (ref 3.5–5.1)
SODIUM: 140 mmol/L (ref 135–145)

## 2015-11-15 LAB — CBC WITH DIFFERENTIAL/PLATELET
BASOS ABS: 0 10*3/uL (ref 0.0–0.1)
BASOS PCT: 0 %
EOS ABS: 0.1 10*3/uL (ref 0.0–0.7)
EOS PCT: 1 %
HCT: 30.5 % — ABNORMAL LOW (ref 36.0–46.0)
Hemoglobin: 9.5 g/dL — ABNORMAL LOW (ref 12.0–15.0)
Lymphocytes Relative: 21 %
Lymphs Abs: 2 10*3/uL (ref 0.7–4.0)
MCH: 30.8 pg (ref 26.0–34.0)
MCHC: 31.1 g/dL (ref 30.0–36.0)
MCV: 99 fL (ref 78.0–100.0)
Monocytes Absolute: 0.9 10*3/uL (ref 0.1–1.0)
Monocytes Relative: 9 %
Neutro Abs: 6.6 10*3/uL (ref 1.7–7.7)
Neutrophils Relative %: 69 %
PLATELETS: 200 10*3/uL (ref 150–400)
RBC: 3.08 MIL/uL — AB (ref 3.87–5.11)
RDW: 15.1 % (ref 11.5–15.5)
WBC: 9.5 10*3/uL (ref 4.0–10.5)

## 2015-11-15 LAB — GLUCOSE, CAPILLARY
GLUCOSE-CAPILLARY: 129 mg/dL — AB (ref 65–99)
GLUCOSE-CAPILLARY: 119 mg/dL — AB (ref 65–99)
Glucose-Capillary: 166 mg/dL — ABNORMAL HIGH (ref 65–99)
Glucose-Capillary: 124 mg/dL — ABNORMAL HIGH (ref 65–99)

## 2015-11-15 LAB — URINE CULTURE: Culture: NO GROWTH

## 2015-11-15 LAB — MAGNESIUM: MAGNESIUM: 1.7 mg/dL (ref 1.7–2.4)

## 2015-11-15 MED ORDER — METHYLPREDNISOLONE SODIUM SUCC 125 MG IJ SOLR
60.0000 mg | INTRAMUSCULAR | Status: AC
  Administered 2015-11-15 – 2015-11-17 (×3): 60 mg via INTRAVENOUS
  Filled 2015-11-15 (×3): qty 2

## 2015-11-15 MED ORDER — SODIUM CHLORIDE 0.9 % IV SOLN
3.0000 g | Freq: Two times a day (BID) | INTRAVENOUS | Status: AC
  Administered 2015-11-15 – 2015-11-17 (×5): 3 g via INTRAVENOUS
  Filled 2015-11-15 (×5): qty 3

## 2015-11-15 MED ORDER — IPRATROPIUM-ALBUTEROL 0.5-2.5 (3) MG/3ML IN SOLN
3.0000 mL | Freq: Three times a day (TID) | RESPIRATORY_TRACT | Status: DC
  Administered 2015-11-15 – 2015-11-16 (×4): 3 mL via RESPIRATORY_TRACT
  Filled 2015-11-15 (×4): qty 3

## 2015-11-15 NOTE — Evaluation (Signed)
Clinical/Bedside Swallow Evaluation Patient Details  Name: [name] MRN: [id number] Date of Birth: 1916-09-20  Today's Date: 11/15/2015 Time: SLP Start Time (ACUTE ONLY): 4098 SLP Stop Time (ACUTE ONLY): 0855 SLP Time Calculation (min) (ACUTE ONLY): 23 min  Past Medical History:  Past Medical History:  Diagnosis Date  . Allergic rhinitis, cause unspecified   . Anemia   . Arthralgia of temporomandibular joint   . Arthropathy, unspecified, site unspecified   . Atrial fibrillation (HCC)   . Bronchitis, acute   . Carpal tunnel syndrome   . Chest pain, unspecified   . Chronic kidney disease, unspecified (HCC)   . Closed dislocation, thoracic vertebra   . Closed fracture of lumbar vertebra without mention of spinal cord injury   . Diabetes type 2, uncontrolled (HCC)   . Diabetes with renal manifestations(250.4)   . Diastasis of muscle   . Eczema   . Gait disorder   . Hematuria, unspecified   . Hemoptysis   . History of nuclear stress test 06/06/2010   lexiscan; normal pattern of perfusion; low risk   . Hyperlipidemia   . Hyperpotassemia   . Hypertension   . Hypothyroid   . Insomnia, unspecified   . Macular degeneration (senile) of retina, unspecified   . Mild memory disturbance   . Myoclonus   . Myoclonus   . Obesity, unspecified   . Open wound of knee, leg (except thigh), and ankle, without mention of complication   . Osteoporosis   . Other atopic dermatitis and related conditions   . Other disorder of calcium metabolism   . Other disorder of calcium metabolism   . Other specified idiopathic peripheral neuropathy   . Personal history of fall   . Personality change due to conditions classified elsewhere   . Scoliosis   . Seborrheic keratosis   . Trigger finger (acquired)   . Type II or unspecified type diabetes mellitus without mention of complication, not stated as uncontrolled   . Unspecified closed fracture of pelvis   . Unspecified constipation   .  Unspecified glaucoma   . Unspecified hereditary and idiopathic peripheral neuropathy   . Unspecified hypertrophic and atrophic condition of skin   . Unspecified pruritic disorder   . Unspecified urinary incontinence   . Unspecified vitamin D deficiency   . Urinary tract infection, site not specified   . Varicose veins of lower extremities with inflammation    Past Surgical History:  Past Surgical History:  Procedure Laterality Date  . ABDOMINAL HYSTERECTOMY     1973  . BIOPSY BREAST     2000  . CORONARY ARTERY BYPASS GRAFT     Dr Tyrone Sage 04/1996  . ESOPHAGOGASTRODUODENOSCOPY N/A 08/17/2012   Procedure: ESOPHAGOGASTRODUODENOSCOPY (EGD);  Surgeon: Hilarie Fredrickson, MD;  Location: [facility];  Service: Endoscopy;  Laterality: N/A;  . HOT HEMOSTASIS N/A 08/17/2012   Procedure: HOT HEMOSTASIS (ARGON PLASMA COAGULATION/BICAP);  Surgeon: Hilarie Fredrickson, MD;  Location: [facility];  Service: Endoscopy;  Laterality: N/A;  . KNEE ARTHROSCOPY     left Dr Thurston Hole   . LASER LAPAROSCOPY     right eye 04/2005  . LUMBAR SPINE SURGERY     Dr Newell Coral   . RIGHT OOPHORECTOMY     1950   HPI:  [name] a 82 y.o.femalewith a medical history significant foatrial fibrillation, hypertension, chronic kidney disease stage IV, and diabetes.  Patient lives with her son at home who is her primary caretaker. She is dependent on him  for most of her ADLs. 9/7719morning son was unable to get mother out of bed. Patient was nearly unresponsive, breathing was laboredprevious day was sleepy all day. In the emergency room found to have a temp of 103.8, tachycardic, tachypneic, hypoxic and borderline hypotensive. MD reports severe sepsis due to UTI with suspected aspiration pna after possible vomiting from sepsis. CXR shows Bibasilar pneumonia left side greater than right. Associated bibasilar subsegmental atelectasis. Son denies any difficulty swallowing.    Assessment / Plan / Recommendation Clinical  Impression  Pt demosntrates no significant signs of dysphagia and no history of difficulty swallowing per pt and son. She is short of breath and when taking consecutive sips RR rate and effort increases post swallow. Masication is also slow due to breathing through the mouth while chewing. Assisted pt in blowing her nose which is congested to aid in nasal breathing and discussed with MD. Advised pt and son to be aware of respiratory fatigue with intake and encourage small bites and sips. Provided moderate verbal cues for pt to return demonstrate. Pt would benefit from being up to chair with meals. SLP will f/u x1 for tolerance of regular thin diet.     Aspiration Risk  Moderate aspiration risk    Diet Recommendation Regular;Thin liquid   Liquid Administration via: Cup;Straw Medication Administration: Whole meds with liquid Supervision: Patient able to self feed;Full supervision/cueing for compensatory strategies Compensations: Slow rate;Small sips/bites Postural Changes: Seated upright at 90 degrees    Other  Recommendations Oral Care Recommendations: Oral care BID   Follow up Recommendations 24 hour supervision/assistance      Frequency and Duration min 1 x/week  2 weeks       Prognosis Prognosis for Safe Diet Advancement: Good      Swallow Study   General HPI: Tammy D Grayis a 82 y.o.femalewith a medical history significant foatrial fibrillation, hypertension, chronic kidney disease stage IV, and diabetes.  Patient lives with her son at home who is her primary caretaker. She is dependent on him for most of her ADLs. 9/3119morning son was unable to get mother out of bed. Patient was nearly unresponsive, breathing was laboredprevious day was sleepy all day. In the emergency room found to have a temp of 103.8, tachycardic, tachypneic, hypoxic and borderline hypotensive. MD reports severe sepsis due to UTI with suspected aspiration pna after possible vomiting from sepsis. CXR shows  Bibasilar pneumonia left side greater than right. Associated bibasilar subsegmental atelectasis. Son denies any difficulty swallowing.  Type of Study: Bedside Swallow Evaluation Diet Prior to this Study: Regular;Thin liquids Temperature Spikes Noted: No Respiratory Status: Nasal cannula History of Recent Intubation: No Behavior/Cognition: Alert;Cooperative;Pleasant mood Oral Cavity Assessment: Within Functional Limits Oral Care Completed by SLP: No Oral Cavity - Dentition: Dentures, top;Dentures, bottom Vision: Functional for self-feeding Self-Feeding Abilities: Able to feed self Patient Positioning: Upright in bed Baseline Vocal Quality: Normal Volitional Cough: Strong Volitional Swallow: Able to elicit    Oral/Motor/Sensory Function Overall Oral Motor/Sensory Function: Within functional limits   Ice Chips     Thin Liquid Thin Liquid: Within functional limits Presentation: Straw;Self Fed    Nectar Thick Nectar Thick Liquid: Not tested   Honey Thick Honey Thick Liquid: Not tested   Puree Puree: Within functional limits   Solid   GO   Solid: Impaired Oral Phase Impairments: Impaired mastication;Other (comment) (breathing through mouth while chewing)       Harlon DittyBonnie Hailei Besser, MA CCC-SLP [phone]  Claudine MoutonDeBlois, Rodneshia Greenhouse Caroline 11/15/2015,9:06 AM

## 2015-11-15 NOTE — Progress Notes (Signed)
Pharmacy Antibiotic Note  [name] is a 82 y.o. female admitted on 11/13/2015 with sepsis. Vanc/Zosyn >> Unasyn for suspected aspiration PNA. Remains afebrile, WBC now WNL. SCr trending down - currently 2.10 (baseline ~1.9). Cultures negative to date.   Plan: -Unasyn 3g q12h  -Monitor renal fcn, cultures, clinical course   Height: 5\' 7"  (170.2 cm) Weight: 175 lb 1.6 oz (79.4 kg) IBW/kg (Calculated) : 61.6  Temp (24hrs), Avg:98.7 F (37.1 C), Min:97.6 F (36.4 C), Max:99.8 F (37.7 C)   Recent Labs Lab 11/13/15 0910 11/13/15 0930 11/13/15 1337 11/14/15 0648 11/15/15 1636  WBC 18.6*  --   --  13.6* 9.5  CREATININE 2.67*  --   --  2.48* 2.10*  LATICACIDVEN  --  1.86 1.68  --   --     Estimated Creatinine Clearance: 16.6 mL/min (by C-G formula based on SCr of 2.1 mg/dL (H)).    Allergies  Allergen Reactions  . Cosopt [Dorzolamide Hcl-Timolol Mal] Other (See Comments)    Redness and burning in both eyes  . Noroxin [Norfloxacin] Swelling  . Sulfa Antibiotics Nausea And Vomiting  . Prozac [Fluoxetine Hcl] Rash    Antimicrobials this admission: Unasyn 9/21 >> Vanc 9/19>>9/21 Zosyn 9/19>>9/21  Dose adjustments this admission: N/A  Microbiology results: 9/19 BCx: NG x2 days 9/19 UCx: multiple species 9/20 UCx: NGF  Thank you for allowing pharmacy to be a part of this patient's care.  Sherle Poeob Ezma Rehm, PharmD Clinical Pharmacist 8:21 PM, 11/15/2015

## 2015-11-15 NOTE — Evaluation (Signed)
Physical Therapy Evaluation Patient Details Name: [name] MRN: [id number] DOB: 05/10/1916 Today's Date: 11/15/2015   History of Present Illness  Esthefany D Grayis a 82 y.o.femalewith a medical history significant for, but not limited to,atrial fibrillation, hypertension, chronic kidney disease stage IV, and diabetes. Patient with PNA and AMS with inabliltiy to get OOB   Clinical Impression  Patient presents with decreased independence with mobility due to deficits listed in PT problem list.  She will benefit from skilled PT in the acute setting to allow return home with family support; HOWEVER she needs to be able to stand and take small steps for son to be able to transfer her.  Hopefully she can participate with standing and transfers next session to allow for d/c home with HHPT and son assist.  If not may needs STSNF prior to d/c home.     Follow Up Recommendations Home health PT;Supervision/Assistance - 24 hour    Equipment Recommendations  Other (comment) (possibly hoyer)    Recommendations for Other Services       Precautions / Restrictions Precautions Precautions: Fall Restrictions Other Position/Activity Restrictions: Pt has swollen R ankle due to recent fall at home. Pt's son states that he took her to MD and was negative for any fractures      Mobility  Bed Mobility Overal bed mobility: Needs Assistance;+2 for physical assistance Bed Mobility: Supine to Sit;Sit to Supine     Supine to sit: Total assist;HOB elevated Sit to supine: +2 for physical assistance;Total assist   General bed mobility comments: assist to bring legs off bed and to lift trunk upright; to supine son assisted with scooting pt up in bed and lowering trunk  Transfers                 General transfer comment: pt unable  Ambulation/Gait                Stairs            Wheelchair Mobility    Modified Rankin (Stroke Patients Only)       Balance        Sitting balance - Comments: Poor siting balance sitting EOB, required max - mod A for support; stayed EOB about 5 minutes encouraging anterior weight shift, use of rail for balance and engaging trunk musculature                                     Pertinent Vitals/Pain Pain Assessment: No/denies pain    Home Living Family/patient expects to be discharged to:: Private residence Living Arrangements: Children Available Help at Discharge: Family;Personal care attendant;Available 24 hours/day Type of Home: House Home Access: Ramped entrance     Home Layout: One level Home Equipment: Emergency planning/management officer - 4 wheels;Walker - 2 wheels;Other (comment) (bed that elevates at [facility], compression stockings) Additional Comments: pt is w/c bound, was able to SPT to and from bed and toilet    Prior Function Level of Independence: Needs assistance   Gait / Transfers Assistance Needed: SPTs only. Uses w/c  ADL's / Homemaking Assistance Needed: assist with showers once a week from aide, dressing. Pt able to apply make up, self feed and grooming        Hand Dominance        Extremity/Trunk Assessment   Upper Extremity Assessment: Defer to OT evaluation  Lower Extremity Assessment: RLE deficits/detail;LLE deficits/detail RLE Deficits / Details: both legs with pitting edema, ankles plantarflexed; AAROM limited ankle DF, AROM knee extension WFL, strength at least 3/5 knee extension LLE Deficits / Details: both legs with pitting edema, ankles plantarflexed; AAROM limited ankle DF, AROM knee extension WFL, strength at least 3/5 knee extension  Cervical / Trunk Assessment: Kyphotic  Communication   Communication: No difficulties  Cognition Arousal/Alertness: Awake/alert Behavior During Therapy: WFL for tasks assessed/performed Overall Cognitive Status: Impaired/Different from baseline Area of Impairment: Orientation;Memory Orientation Level: Disoriented  to;Place;Time;Situation   Memory: Decreased short-term memory              General Comments      Exercises     Assessment/Plan    PT Assessment Patient needs continued PT services  PT Problem List Decreased strength;Decreased mobility;Decreased balance;Decreased activity tolerance;Decreased safety awareness          PT Treatment Interventions DME instruction;Functional mobility training;Balance training;Therapeutic exercise;Therapeutic activities;Patient/family education    PT Goals (Current goals can be found in the Care Plan section)  Acute Rehab PT Goals Patient Stated Goal: be strong enough to return home to [facility]LOF PT Goal Formulation: With patient/family Time For Goal Achievement: 11/22/15 Potential to Achieve Goals: Fair    Frequency Min 3X/week   Barriers to discharge        Co-evaluation               End of Session   Activity Tolerance: Patient limited by fatigue Patient left: in bed;with call bell/phone within reach;with family/visitor present           Time: [id number] PT Time Calculation (min) (ACUTE ONLY): 12 min   Charges:   PT Evaluation $PT Eval Moderate Complexity: 1 Procedure     PT G CodesElray Mcgregor:        Kate Sweetman 11/15/2015, 5:15 PM  Sheran Lawlessyndi Mehgan Santmyer, PT [phone] 11/15/2015

## 2015-11-15 NOTE — Progress Notes (Signed)
PROGRESS NOTE    [name]  KGM:[id number] DOB: 1916/10/18 DOA: 11/13/2015 PCP: Murray Hodgkins, MD     Brief Narrative:  82 y.o. WF PMHx Atrial fibrillation, HTN, CKD stage Stage IV, DM Type 2.    Patient unable to provide history secondary to encephalopathy. Patient lives with her son at home who is her primary caretaker. She is dependent on him for most of her ADLs. 9/19 morning son was unable to get mother out of bed. Patient was nearly unresponsive, breathing was labored previous day was sleepy all day. In the emergency room found to have a temp of 103.8, tachycardic, tachypneic, hypoxic and borderline hypotensive   Subjective: 9/21 A/O 1 (does not know where, when, why) patient alert, cooperative, follows all commands. Son states usually A/O 4. Just had a birthday on Saturday and had approximately 20 people over. Son is primary caregiver. States patient wheelchair-bound except for transfer to chair, commode, 3in 1 chair for shower purposes.     Assessment & Plan:   Principal Problem:   Sepsis (HCC) Active Problems:   Hypothyroid   Hyperlipidemia   Paroxysmal atrial fibrillation (HCC)   DM (diabetes mellitus), type 2 with renal complications (HCC)   HTN (hypertension)   Dementia   UTI (lower urinary tract infection)   CKD stage 4 due to type 2 diabetes mellitus (HCC)   CAP (community acquired pneumonia)   Diabetes mellitus with complication (HCC)   Severe sepsis/Aspiration pneumonia -due to aspiration pneumonia vs UTI -Resolving -Normal saline 58ml/hr -Blood and urine Cx negative but patient had already received antibiotic. -Per son witnessed aspiration: Narrow antibiotics to Unasyn complete 5 day course -Solu-Medrol 50 mg daily 3 doses -Flutter valve -DuoNeb TID  Possible Subacute/Acute CVA  on CT head -I discussed with patient and son, they do not want to pursue a stroke work and this will not significantly change management at age 88 -hence  appropriately defered stroke workup at this time -continue ASA/statin, will check SLP/OT/PT -On exam she has mild pronator drift of right upper extremity  AKI on CKD stage 4(Baseline creatinine of 1.9) Lab Results  Component Value Date   CREATININE 2.10 (H) 11/15/2015   CREATININE 2.48 (H) 11/14/2015   CREATININE 2.67 (H) 11/13/2015  -Continue gentle fluid resuscitation: Improving  Diabetes mellitus type II controlled with complication -Hold metformin, -Sensitive SSI  Paroxysmal atrial fibrillation(her [place] to rest score is 5) -Not on anticoagulation due to advanced age, fall risk,  -Continue aspirin -Currently in NSR  Chronic debility/ long-standing bilateral lower extremity weakness -Disabled and wheelchair bound at baseline for over 5 years   DVT prophylaxis: Aspirin, SCD Code Status: DO NOT RESUSCITATE Family Communication: Spoke with son at length concerning plan of care Disposition Plan: Home   Consultants:  None  Procedures/Significant Events:  9/19 PCXR:-Bibasilar pneumonia left side greater than right.     Cultures 9/19 blood AC/hand NGTD 9/19 urine positive multiple species 9/20 urine negative  Antimicrobials: Zosyn 9/19>> 9/21 Vancomycin 9/19>> 9/21 Unasyn 9/21>>   Devices None   LINES / TUBES:  None    Continuous Infusions: . sodium chloride 50 mL/hr at 11/15/15 0527     Objective: Vitals:   11/14/15 1553 11/14/15 2156 11/15/15 0558 11/15/15 1441  BP: (!) 105/55 (!) 114/52 132/82 (!) 127/56  Pulse:  67 76 80  Resp:  18 (!) 22 19  Temp:  97.6 F (36.4 C) 99.8 F (37.7 C)   TempSrc:      SpO2:  94% 94%  96%  Weight:   79.4 kg (175 lb 1.6 oz)   Height:        Intake/Output Summary (Last 24 hours) at 11/15/15 1615 Last data filed at 11/15/15 1443  Gross per 24 hour  Intake              995 ml  Output             2475 ml  Net            -1480 ml   Filed Weights   11/13/15 0853 11/14/15 0549 11/15/15 0558  Weight: 74.4 kg  (164 lb) 76.9 kg (169 lb 9.6 oz) 79.4 kg (175 lb 1.6 oz)    Examination:  General: A/O 1 (does not know where, why, when), positive  acute respiratory distress Eyes: negative scleral hemorrhage, negative anisocoria, negative icterus ENT: Negative Runny nose, negative gingival bleeding, Neck:  Negative scars, masses, torticollis, lymphadenopathy, JVD Lungs: Diffuse expiratory wheezes primarily upper lobes, by basilar rhonchi,  Cardiovascular: Regular rate and rhythm without murmur gallop or rub normal S1 and S2 Abdomen: negative abdominal pain, nondistended, positive soft, bowel sounds, no rebound, no ascites, no appreciable mass Extremities: bilateral lower extremity edema 2+ (per son baseline)  Skin: Large well-healed incision on medial aspect RLE  Psychiatric:  Unable to fully evaluate Central nervous system:  Cranial nerves II through XII intact, tongue/uvula midline, patient extremely weak and deconditioned. Able to sit up on the edge of the bed for ~5 minutes and then need to lay back into bed. negative dysarthria, negative expressive aphasia, negative receptive aphasia.  .     Data Reviewed: Care during the described time interval was provided by me .  I have reviewed this patient's available data, including medical history, events of note, physical examination, and all test results as part of my evaluation. I have personally reviewed and interpreted all radiology studies.  CBC:  Recent Labs Lab 11/13/15 0910 11/14/15 0648  WBC 18.6* 13.6*  NEUTROABS 16.9*  --   HGB 11.3* 8.7*  HCT 35.5* 29.0*  MCV 97.8 101.0*  PLT 274 221   Basic Metabolic Panel:  Recent Labs Lab 11/13/15 0910 11/14/15 0648  NA 141 141  K 4.8 4.3  CL 113* 118*  CO2 18* 17*  GLUCOSE 244* 118*  BUN 54* 56*  CREATININE 2.67* 2.48*  CALCIUM 9.9 8.4*   GFR: Estimated Creatinine Clearance: 14.1 mL/min (by C-G formula based on SCr of 2.48 mg/dL (H)). Liver Function Tests:  Recent Labs Lab  11/13/15 0910  AST 20  ALT 13*  ALKPHOS 96  BILITOT 0.7  PROT 6.3*  ALBUMIN 3.2*   No results for input(s): LIPASE, AMYLASE in the last 168 hours. No results for input(s): AMMONIA in the last 168 hours. Coagulation Profile: No results for input(s): INR, PROTIME in the last 168 hours. Cardiac Enzymes: No results for input(s): CKTOTAL, CKMB, CKMBINDEX, TROPONINI in the last 168 hours. BNP (last 3 results) No results for input(s): PROBNP in the last 8760 hours. HbA1C: No results for input(s): HGBA1C in the last 72 hours. CBG:  Recent Labs Lab 11/14/15 1215 11/14/15 1701 11/14/15 2021 11/15/15 0919 11/15/15 1206  GLUCAP 129* 110* 122* 129* 166*   Lipid Profile: No results for input(s): CHOL, HDL, LDLCALC, TRIG, CHOLHDL, LDLDIRECT in the last 72 hours. Thyroid Function Tests: No results for input(s): TSH, T4TOTAL, FREET4, T3FREE, THYROIDAB in the last 72 hours. Anemia Panel: No results for input(s): VITAMINB12, FOLATE, FERRITIN, TIBC, IRON, RETICCTPCT in  the last 72 hours. Sepsis Labs:  Recent Labs Lab 11/13/15 0930 11/13/15 1337  LATICACIDVEN 1.86 1.68    Recent Results (from the past 240 hour(s))  Blood Culture (routine x 2)     Status: None (Preliminary result)   Collection Time: 11/13/15  9:10 AM  Result Value Ref Range Status   Specimen Description BLOOD RIGHT ANTECUBITAL  Final   Special Requests BOTTLES DRAWN AEROBIC AND ANAEROBIC 5CC  Final   Culture NO GROWTH 2 DAYS  Final   Report Status PENDING  Incomplete  Blood Culture (routine x 2)     Status: None (Preliminary result)   Collection Time: 11/13/15  9:25 AM  Result Value Ref Range Status   Specimen Description BLOOD RIGHT HAND  Final   Special Requests BOTTLES DRAWN AEROBIC AND ANAEROBIC 5CC  Final   Culture NO GROWTH 2 DAYS  Final   Report Status PENDING  Incomplete  Urine culture     Status: Abnormal   Collection Time: 11/13/15  9:27 AM  Result Value Ref Range Status   Specimen Description  URINE, CATHETERIZED  Final   Special Requests NONE  Final   Culture MULTIPLE SPECIES PRESENT, SUGGEST RECOLLECTION (A)  Final   Report Status 11/14/2015 FINAL  Final  Culture, Urine     Status: None   Collection Time: 11/14/15 12:50 PM  Result Value Ref Range Status   Specimen Description URINE, CATHETERIZED  Final   Special Requests NONE  Final   Culture NO GROWTH  Final   Report Status 11/15/2015 FINAL  Final         Radiology Studies: No results found.      Scheduled Meds: . aspirin EC  81 mg Oral Daily  . atorvastatin  20 mg Oral q1800  . guaiFENesin  600 mg Oral BID  . heparin  5,000 Units Subcutaneous Q8H  . insulin aspart  0-9 Units Subcutaneous TID WC  . levothyroxine  75 mcg Oral QAC breakfast  . pantoprazole  20 mg Oral Daily  . piperacillin-tazobactam (ZOSYN)  IV  2.25 g Intravenous Q6H  . pregabalin  75 mg Oral BID  . vancomycin  1,000 mg Intravenous Q48H   Continuous Infusions: . sodium chloride 50 mL/hr at 11/15/15 0527     LOS: 2 days    Time spent:40 min    Ercilia Bettinger, Roselind Messier, MD [facility] Pager [phone]  If 7PM-7AM, please contact night-coverage [email or web address] Password [facility] 11/15/2015, 4:15 PM

## 2015-11-15 NOTE — Evaluation (Signed)
Occupational Therapy Evaluation Patient Details Name: [name] MRN: [id number] DOB: 1916-06-06 Today's Date: 11/15/2015    History of Present Illness Tammy D Grayis a 82 y.o.femalewith a medical history significant for, but not limited to,atrial fibrillation, hypertension, chronic kidney disease stage IV, and diabetes. Patient with PNA and AMS with inabliltiy to get OOB    Clinical Impression   Pt with decline in function and safety with ADLs and ADL mobility with decreased strength, balance and endurance. Pt lives at home withher son as primary caregiver with personal care aid as well for shower once a week. Pt required extensive assist with ADLs PTA, however pt able to perform  Grooming and self feeding and assist with SPTs. Pt has been w/c bound for quite sometime. Pt and son prefer to d/c home, however understand that pt may have to d/c to SNF for short term rehab depending on acute stay progress. Pt would benefit form acute OT services to address impairments to increase level of function and safety    Follow Up Recommendations  Home health OT;Supervision/Assistance - 24 hour;Other (comment) (may need short term SNF depending on progress)    Equipment Recommendations  Other (comment) (compression stocking aid)    Recommendations for Other Services       Precautions / Restrictions Precautions Precautions: Fall Restrictions Weight Bearing Restrictions: No Other Position/Activity Restrictions: Pt has swollen R ankle due to recent fall at home. Pt's son states that he took her to [facility] and was negative for any fractures      Mobility Bed Mobility Overal bed mobility: Needs Assistance;+2 for physical assistance Bed Mobility: Supine to Sit;Sit to Supine     Supine to sit: Total assist;HOB elevated Sit to supine: Total assist      Transfers                 General transfer comment: pt unable    Balance Overall balance assessment: Needs assistance    Sitting balance-Leahy Scale: Poor Sitting balance - Comments: Poor siting balance sitting EOB, required max - mod A for support                                    ADL Overall ADL's : Needs assistance/impaired     Grooming: Wash/dry hands;Wash/dry face;Bed level;Set up;Supervision/safety Grooming Details (indicate cue type and reason): unable at EOB due to Poor sitting balance Upper Body Bathing: Minimal assitance Upper Body Bathing Details (indicate cue type and reason): unable at EOB due to Poor sitting balance Lower Body Bathing: Total assistance   Upper Body Dressing : Minimal assistance Upper Body Dressing Details (indicate cue type and reason): unable at EOB due to Poor sitting balance Lower Body Dressing: Total assistance     Toilet Transfer Details (indicate cue type and reason): unable, pt will most likely require +2 assist to [facility] Toileting- Clothing Manipulation and Hygiene: Bed level;Total assistance         General ADL Comments: pt requires extensive assist at home with ADLs, however was able to feed hersel, apply make up/grooming while seated. Pt's son states that her compression stockings are very difficulty to donn, OT educated pt and her son on compression stocking aid for home use     Vision  no change from baseline              Pertinent Vitals/Pain Pain Assessment: No/denies pain     Hand  Dominance Right   Extremity/Trunk Assessment Upper Extremity Assessment Upper Extremity Assessment: Generalized weakness   Lower Extremity Assessment Lower Extremity Assessment: Defer to PT evaluation       Communication Communication Communication: No difficulties   Cognition Arousal/Alertness: Awake/alert Behavior During Therapy: WFL for tasks assessed/performed Overall Cognitive Status: History of cognitive impairments - at baseline                     General Comments   Pt very pleasant and cooperative, son very supportive                  Home Living Family/patient expects to be discharged to:: Private residence Living Arrangements: Children Available Help at Discharge: Family;Personal care attendant;Available 24 hours/day Type of Home: House Home Access: Ramped entrance     Home Layout: One level     Bathroom Shower/Tub: Producer, television/film/videoWalk-in shower   Bathroom Toilet: Handicapped height     Home Equipment: Emergency planning/management officerhower seat;Walker - 4 wheels;Walker - 2 wheels;Other (comment) [facility])   Additional Comments: pt is w/c bound, was able to SPT to and from bed and toilet      Prior Functioning/Environment Level of Independence: Needs assistance  Gait / Transfers Assistance Needed: SPTs only. Uses w/c ADL's / Homemaking Assistance Needed: assist with showers once a week from aide, dressing. Pt able to apply make up, self feed and grooming            OT Problem List: Decreased strength;Impaired balance (sitting and/or standing);Decreased activity tolerance;Decreased knowledge of use of DME or AE   OT Treatment/Interventions: Self-care/ADL training;DME and/or AE instruction;Therapeutic activities;Patient/family education;Balance training    OT Goals(Current goals can be found in the care plan section) Acute Rehab OT Goals Patient Stated Goal: be strong enough to return home to PLOF OT Goal Formulation: With patient/family Time For Goal Achievement: 11/22/15 Potential to Achieve Goals: Good ADL Goals Pt Will Perform Grooming: with min guard assist;sitting (EOB with min A for support/balance) Pt Will Perform Upper Body Bathing: with min assist;sitting (EOB with min A for support/balance) Pt Will Perform Upper Body Dressing: with min assist;sitting (EOB with min A for support/balance) Pt Will Transfer to Toilet: bedside commode;stand pivot transfer;with max assist;with mod assist;with +2 assist Pt Will Perform Tub/Shower Transfer: shower seat;Stand pivot transfer;with mod  assist;with max assist;with +2 total assist Additional ADL Goal #1: Pt will sit EOB to participate in dynamic balance acitivites to increase balance and safety for functional tasks  OT Frequency: Min 2X/week   Barriers to D/C:    uncertain if pt's son (primary caregiver) and aide will be able to provide current level of care                     End of Session    Activity Tolerance: Patient limited by fatigue Patient left: in bed;with call bell/phone within reach;with bed alarm set;with family/visitor present   Time: [id number] OT Time Calculation (min): 32 min Charges:  OT General Charges $OT Visit: 1 Procedure OT Evaluation $OT Eval Moderate Complexity: 1 Procedure OT Treatments $Self Care/Home Management : 8-22 mins $Therapeutic Activity: 8-22 mins G-Codes:    Galen ManilaSpencer, Latera Mclin Jeanette 11/15/2015, 2:48 PM

## 2015-11-16 ENCOUNTER — Telehealth: Payer: Self-pay

## 2015-11-16 LAB — GLUCOSE, CAPILLARY
GLUCOSE-CAPILLARY: 181 mg/dL — AB (ref 65–99)
GLUCOSE-CAPILLARY: 217 mg/dL — AB (ref 65–99)
GLUCOSE-CAPILLARY: 216 mg/dL — AB (ref 65–99)
GLUCOSE-CAPILLARY: 219 mg/dL — AB (ref 65–99)

## 2015-11-16 MED ORDER — IPRATROPIUM-ALBUTEROL 0.5-2.5 (3) MG/3ML IN SOLN
3.0000 mL | RESPIRATORY_TRACT | Status: DC | PRN
  Administered 2015-11-17 – 2015-11-19 (×5): 3 mL via RESPIRATORY_TRACT
  Filled 2015-11-16 (×5): qty 3

## 2015-11-16 NOTE — Progress Notes (Signed)
PROGRESS NOTE    [name]  ZOX:[id number] DOB: 1916/04/05 DOA: 11/13/2015 PCP: Murray HodgkinsArthur Green, MD Brief Narrative: Tammy QuamWinifred D [name] is a 82 y.o. female with a medical history significant for, but not  limited to, atrial fibrillation, hypertension, chronic kidney disease stage IV, and diabetes. Patient unable to provide history secondary to encephalopathy. Patient lives with her son at home who is her primary caretaker. She is dependent on him for most of her ADLs. 9/19 morning son was unable to get mother out of bed. Patient was nearly unresponsive, breathing was labored previous day was sleepy all day. In the emergency room found to have a temp of 103.8, tachycardic, tachypneic, hypoxic and borderline hypotensive  Assessment & Plan: 1. Severe sepsis -due to aspiration pneumonia vs UTI -clinically improving on Unasyn -continue IVF, lactate improved -FU Blood and urine Cx  2. Possible subacute CVA on CT head -I discussed with patient and son, they do not want to pursue a stroke work up and this will not significantly change management at age 82 -hence appropriately defered stroke workup at this time -continue ASA/statin, will check SLP/OT/PT -On exam she has mild pronator drift of right upper extremity  3. AK I on CKD4 -Baseline creatinine of 1.9 this was 2.6 on admission, -Improving, continue IV fluids, -Monitor bmet and urine output  4. Diabetes mellitus -Hold metformin, continue sliding scale insulin - relatively well controlled  5. History of paroxysmal atrial fibrillation -Not on anticoagulation due to advanced age, fall risk, her [place] to rest score is 5 -Continue aspirin  6. Chronic debility/ long-standing bilateral lower extremity weakness -Disabled and wheelchair bound at baseline for over 5 years   DVT prophylaxis:      SQ Heparin  Code Status:      DNR Family Communication: Discussed with son at bedside  Disposition Plan:   Discharge back home in a few days if  remained stable   Antimicrobials:Vanc/Zosyn 9/19->  Subjective: Pt has no new complaints no acute issues overnight.  Objective: Vitals:   11/16/15 0550 11/16/15 0638 11/16/15 0818 11/16/15 1358  BP: (!) 160/78   (!) 152/66  Pulse: 69   73  Resp: 18   (!) 26  Temp: 98 F (36.7 C)   97.8 F (36.6 C)  TempSrc: Oral   Oral  SpO2: 94%  96% 98%  Weight:  78.9 kg (174 lb)    Height:        Intake/Output Summary (Last 24 hours) at 11/16/15 1724 Last data filed at 11/16/15 0624  Gross per 24 hour  Intake          1321.67 ml  Output             1800 ml  Net          -478.33 ml   Filed Weights   11/14/15 0549 11/15/15 0558 11/16/15 [id number]  Weight: 76.9 kg (169 lb 9.6 oz) 79.4 kg (175 lb 1.6 oz) 78.9 kg (174 lb)    Examination:  General exam: Appears calm and comfortable, in nad. Respiratory system: Clear to auscultation. Respiratory effort normal. Cardiovascular system: S1 & S2 heard, RRR. No JVD, murmurs, rubs, gallops or clicks. No pedal edema. Gastrointestinal system: Abdomen is nondistended, soft and nontender. No organomegaly or masses felt. Normal bowel sounds heard. Central nervous system: Alert and oriented. No focal neurological deficits. Extremities: Symmetric 5 x 5 power. Skin: No rashes, lesions or ulcers Psychiatry: Judgement and insight appear normal. Mood & affect appropriate.  Data Reviewed: I have personally reviewed following labs and imaging studies  CBC:  Recent Labs Lab 11/13/15 0910 11/14/15 0648 11/15/15 1636  WBC 18.6* 13.6* 9.5  NEUTROABS 16.9*  --  6.6  HGB 11.3* 8.7* 9.5*  HCT 35.5* 29.0* 30.5*  MCV 97.8 101.0* 99.0  PLT 274 221 200   Basic Metabolic Panel:  Recent Labs Lab 11/13/15 0910 11/14/15 0648 11/15/15 1636  NA 141 141 140  K 4.8 4.3 4.3  CL 113* 118* 113*  CO2 18* 17* 18*  GLUCOSE 244* 118* 133*  BUN 54* 56* 37*  CREATININE 2.67* 2.48* 2.10*  CALCIUM 9.9 8.4* 9.3  MG  --   --  1.7   GFR: Estimated Creatinine  Clearance: 16.6 mL/min (by C-G formula based on SCr of 2.1 mg/dL (H)). Liver Function Tests:  Recent Labs Lab 11/13/15 0910  AST 20  ALT 13*  ALKPHOS 96  BILITOT 0.7  PROT 6.3*  ALBUMIN 3.2*   No results for input(s): LIPASE, AMYLASE in the last 168 hours. No results for input(s): AMMONIA in the last 168 hours. Coagulation Profile: No results for input(s): INR, PROTIME in the last 168 hours. Cardiac Enzymes: No results for input(s): CKTOTAL, CKMB, CKMBINDEX, TROPONINI in the last 168 hours. BNP (last 3 results) No results for input(s): PROBNP in the last 8760 hours. HbA1C: No results for input(s): HGBA1C in the last 72 hours. CBG:  Recent Labs Lab 11/15/15 1647 11/15/15 2109 11/16/15 0807 11/16/15 1212 11/16/15 1709  GLUCAP 119* 124* 181* 217* 216*   Lipid Profile: No results for input(s): CHOL, HDL, LDLCALC, TRIG, CHOLHDL, LDLDIRECT in the last 72 hours. Thyroid Function Tests: No results for input(s): TSH, T4TOTAL, FREET4, T3FREE, THYROIDAB in the last 72 hours. Anemia Panel: No results for input(s): VITAMINB12, FOLATE, FERRITIN, TIBC, IRON, RETICCTPCT in the last 72 hours. Urine analysis:    Component Value Date/Time   COLORURINE YELLOW 11/13/2015 0927   APPEARANCEUR CLOUDY (A) 11/13/2015 0927   APPEARANCEUR Cloudy (A) 08/29/2014 1428   LABSPEC 1.025 11/13/2015 0927   PHURINE 5.0 11/13/2015 0927   GLUCOSEU NEGATIVE 11/13/2015 0927   HGBUR LARGE (A) 11/13/2015 0927   BILIRUBINUR NEGATIVE 11/13/2015 0927   BILIRUBINUR negative 12/13/2014 1217   BILIRUBINUR Negative 08/29/2014 1428   KETONESUR 15 (A) 11/13/2015 0927   PROTEINUR 100 (A) 11/13/2015 0927   UROBILINOGEN negative 12/13/2014 1217   UROBILINOGEN 0.2 07/12/2014 1251   NITRITE NEGATIVE 11/13/2015 0927   LEUKOCYTESUR LARGE (A) 11/13/2015 0927   LEUKOCYTESUR 3+ (A) 08/29/2014 1428   Sepsis Labs: @LABRCNTIP (procalcitonin:4,lacticidven:4)  ) Recent Results (from the past 240 hour(s))  Blood  Culture (routine x 2)     Status: None (Preliminary result)   Collection Time: 11/13/15  9:10 AM  Result Value Ref Range Status   Specimen Description BLOOD RIGHT ANTECUBITAL  Final   Special Requests BOTTLES DRAWN AEROBIC AND ANAEROBIC 5CC  Final   Culture NO GROWTH 3 DAYS  Final   Report Status PENDING  Incomplete  Blood Culture (routine x 2)     Status: None (Preliminary result)   Collection Time: 11/13/15  9:25 AM  Result Value Ref Range Status   Specimen Description BLOOD RIGHT HAND  Final   Special Requests BOTTLES DRAWN AEROBIC AND ANAEROBIC 5CC  Final   Culture NO GROWTH 3 DAYS  Final   Report Status PENDING  Incomplete  Urine culture     Status: Abnormal   Collection Time: 11/13/15  9:27 AM  Result Value Ref Range  Status   Specimen Description URINE, CATHETERIZED  Final   Special Requests NONE  Final   Culture MULTIPLE SPECIES PRESENT, SUGGEST RECOLLECTION (A)  Final   Report Status 11/14/2015 FINAL  Final  Culture, Urine     Status: None   Collection Time: 11/14/15 12:50 PM  Result Value Ref Range Status   Specimen Description URINE, CATHETERIZED  Final   Special Requests NONE  Final   Culture NO GROWTH  Final   Report Status 11/15/2015 FINAL  Final         Radiology Studies: No results found.      Scheduled Meds: . ampicillin-sulbactam (UNASYN) IV  3 g Intravenous Q12H  . aspirin EC  81 mg Oral Daily  . atorvastatin  20 mg Oral q1800  . guaiFENesin  600 mg Oral BID  . heparin  5,000 Units Subcutaneous Q8H  . insulin aspart  0-9 Units Subcutaneous TID WC  . ipratropium-albuterol  3 mL Nebulization TID  . levothyroxine  75 mcg Oral QAC breakfast  . methylPREDNISolone (SOLU-MEDROL) injection  60 mg Intravenous Q24H  . pantoprazole  20 mg Oral Daily  . pregabalin  75 mg Oral BID   Continuous Infusions: . sodium chloride 50 mL/hr at 11/15/15 0527     LOS: 3 days    Time spent: 35 min  Penny Pia  [facility] Pager [phone]  If  7PM-7AM, please contact night-coverage [email or web address] Password [facility] 11/16/2015, 5:24 PM

## 2015-11-16 NOTE — Clinical Social Work Note (Signed)
Clinical Social Work Assessment  Patient Details  Name: [name] MRN: [id number] Date of Birth: 03/20/1916  Date of referral:  11/16/15               Reason for consult:  Facility Placement                Permission sought to share information with:  Facility Sport and exercise psychologist, Family Supports Permission granted to share information::  No (Patient is disoriented)  Name::     Darla Lesches::  SNFs  Relationship::  Son  Contact Information:  [phone]  Housing/Transportation Living arrangements for the past 2 months:  Single Family Home Source of Information:  Adult Children Patient Interpreter Needed:  None Criminal Activity/Legal Involvement Pertinent to Current Situation/Hospitalization:  No - Comment as needed Significant Relationships:  Adult Children Lives with:  Adult Children Do you feel safe going back to the place where you live?  No Need for family participation in patient care:  Yes (Comment)  Care giving concerns:  CSW received consult for possible SNF placement at time of discharge. CSW met with patient and patient's son at bedside regarding PT recommendation of SNF placement at time of discharge. Patient is disoriented. Per patient's son, patient lives with son and has an aide come in every so often to help patient. Patient's son is not sure if he can currently care for patient at their home given patient's current physical needs and fall risk. Patient's son expressed understanding of PT recommendation and is agreeable to SNF placement at time of discharge if he feels patient cannot go home. CSW to continue to follow and assist with discharge planning needs.   Social Worker assessment / plan:  CSW spoke with patient's son concerning possibility of rehab at [facility] before returning home.  Employment status:  Retired Nurse, adult PT Recommendations:  [place] / Referral to community resources:  [place]  Patient/Family's Response to care:  Patient's son recognizes need for rehab before returning home and may be agreeable to a SNF in [place]. Patient reported preference for [facility].  Patient/Family's Understanding of and Emotional Response to Diagnosis, Current Treatment, and Prognosis:  Patient/family is realistic regarding therapy needs and expressed being hopeful for SNF placement. Patient's son expressed understanding of CSW role and discharge process. No questions/concerns about plan or treatment.    Emotional Assessment Appearance:    Attitude/Demeanor/Rapport:  Other (Disoriented but pleasant) Affect (typically observed):  Unable to Assess Orientation:  Oriented to Self, Oriented to Place Alcohol / Substance use:  Not Applicable Psych involvement (Current and /or in the community):  No (Comment)  Discharge Needs  Concerns to be addressed:  Care Coordination Readmission within the last 30 days:  No Current discharge risk:  None Barriers to Discharge:  Continued Medical Work up   [employer], [place] 11/16/2015, 4:44 PM

## 2015-11-16 NOTE — Progress Notes (Signed)
Occupational Therapy Treatment Patient Details Name: [name] MRN: [id number] DOB: 09-11-1916 Today's Date: 11/16/2015    History of present illness [name] a 82 y.o.femalewith a medical history significant for, but not limited to,atrial fibrillation, hypertension, chronic kidney disease stage IV, and diabetes. Patient with PNA and AMS with inabliltiy to get OOB    OT comments  Pt making progress with functional goals. Pt's O2 SATs remained above 90% during most of session with dropping to 85% during reaching activities on 2 occasions. Pt instructed in deep breathing and recovers in seconds to >90%  Follow Up Recommendations  Home health OT;Supervision/Assistance - 24 hour;Other (comment) (may need SNF depending on progress)    Equipment Recommendations   (compression stocking aid)    Recommendations for Other Services      Precautions / Restrictions Precautions Precautions: Fall Restrictions Weight Bearing Restrictions: No Other Position/Activity Restrictions: Pt has swollen R ankle due to recent fall at home. Pt's son states that he took her to MD and was negative for any fractures       Mobility Bed Mobility               General bed mobility comments: pt on [facility] with nurse tech upon entering room  Transfers Overall transfer level: Needs assistance Equipment used: 2 person hand held assist Transfers: Sit to/from Stand;Stand Pivot Transfers Sit to Stand: Max assist;+2 physical assistance;+2 safety/equipment Stand pivot transfers: Max assist;+2 physical assistance;+2 safety/equipment       General transfer comment: verbal/physical cues for leaning forward and correct ghand placement to assist using her UEs in prep for sit - stand    Balance  sitting balance Poor Standing balance zero                                 ADL Overall ADL's : Needs assistance/impaired     Grooming: Wash/dry hands;Wash/dry face;Set up;Sitting                   Toilet Transfer: BSC;Stand-pivot;+2 for physical assistance;+2 for safety/equipment;Maximal assistance   Toileting- Clothing Manipulation and Hygiene: Total assistance;Sit to/from stand;+2 for safety/equipment;+2 for physical assistance       Functional mobility during ADLs: Maximal assistance;+2 for physical assistance;+2 for safety/equipment General ADL Comments: pt required max cues to lean forward in prep for sit - stand       Vision  wears glasses, no change from baseline                              Cognition   Behavior During Therapy: [facility] for tasks assessed/performed Overall Cognitive Status: Impaired/Different from baseline Area of Impairment: Memory     Memory: Decreased short-term memory               Extremity/Trunk Assessment   generalized weakness            Exercises Other Exercises Other Exercises: pt seated in recliner for reaching exercises to increase sitting balance/trunk strength          General Comments  pt very pleasant and cooperative    Pertinent Vitals/ Pain       Pain Assessment: No/denies pain  Home Living  at home with her son (primary caregiver)  Prior Functioning/Environment  assist with transfers and ADLs, non ambulatory            Frequency  Min 2X/week        Progress Toward Goals  OT Goals(current goals can now be found in the care plan section)  Progress towards OT goals: Progressing toward goals     Plan Discharge plan remains appropriate    Co-evaluation     Yes            End of Session Equipment Utilized During Treatment: Gait belt;Other (comment) [facility]   Activity Tolerance Patient tolerated treatment well   Patient Left with call bell/phone within reach;with family/visitor present;in chair   Nurse Communication          Time: [id number] OT Time Calculation (min): 24 min  Charges: OT General  Charges $OT Visit: 1 Procedure OT Treatments $Therapeutic Activity: 8-22 mins  Galen ManilaSpencer, Tishie Altmann Jeanette 11/16/2015, 11:49 AM

## 2015-11-16 NOTE — Care Management Note (Addendum)
Case Management Note  Patient Details  Name: Tammy QuamWinifred D Folden MRN: [id number] Date of Birth: [birth year]  Subjective/Objective:                 Patient admitted from home, lives with son who is her caretaker. Bilateral PNA, IV Abx, supplemental O2, IVF, AMS on admission now improving. Patient's son states that they have personal aid that comes once a week for services so he can get out of house to do errands. Patient has all DME needed at home. He states he has seen significant enough decline in her strength that she should be considered for SNF placement. CM and son spoke with PT who will reevaluate her today. CM updated CSW to watch for SNF rec. CM also alerted Davonna BellingMary Yonjoff, Liaison with [facility] to watch for needs over the weekend as [facility] if patient was to DC to home.    Action/Plan:  CM will continue to follow and work with CSW to establish discharge plan. 11-21-15 patient to dc to snf today, updated Davonna BellingMary Yonjoff [facility] Expected Discharge Date:                  Expected Discharge Plan:  Home w Home Health Services (vs. SNF)  In-House Referral:  Clinical Social Work  Discharge planning Services  CM Consult  Post Acute Care Choice:  NA Choice offered to:  Adult Children  DME Arranged:  N/A DME Agency:  NA  HH Arranged:    HH Agency:     Status of Service:  In process, will continue to follow  If discussed at Long Length of Stay Meetings, dates discussed:    Additional Comments:  Lawerance SabalDebbie Rynlee Lisbon, RN 11/16/2015, 11:23 AM

## 2015-11-16 NOTE — Progress Notes (Signed)
Physical Therapy Treatment Patient Details Name: [name] MRN: [id number] DOB: 03-Nov-1916 Today's Date: 11/16/2015    History of Present Illness [name] a 82 y.o.femalewith a medical history significant for, but not limited to,atrial fibrillation, hypertension, chronic kidney disease stage IV, and diabetes. Patient with PNA and AMS with inabliltiy to get OOB     PT Comments    Patient progressing this session with OOB activities and standing with support for hygiene after toileting.  Son likely unable to complete safe transfers with patient at home currently so she may benefit from [facility] prior to d/c home to improve strength and safety with transfers. Will continue skilled PT in the acute setting for strengthening and improved transfers.   Follow Up Recommendations  SNF     Equipment Recommendations  Other (comment) (TBA)    Recommendations for Other Services       Precautions / Restrictions Precautions Precautions: Fall Restrictions Weight Bearing Restrictions: No Other Position/Activity Restrictions: Pt has swollen R ankle due to recent fall at home. Pt's son states that he took her to MD and was negative for any fractures    Mobility  Bed Mobility               General bed mobility comments: pt on [facility] with nurse tech upon entering room  Transfers Overall transfer level: Needs assistance Equipment used: 2 person hand held assist Transfers: Sit to/from Stand;Stand Pivot Transfers Sit to Stand: Max assist;+2 physical assistance;+2 safety/equipment Stand pivot transfers: Max assist;+2 physical assistance;+2 safety/equipment       General transfer comment: assist and cues for anterior weight shift and lifting and lowering assist x 2 to [facility] then pivotal steps with Bil UE support to get to recliner  Ambulation/Gait                 Stairs            Wheelchair Mobility    Modified Rankin (Stroke Patients Only)       Balance  Overall balance assessment: Needs assistance   Sitting balance-Leahy Scale: Poor Sitting balance - Comments: sitting in chair able to get to edge and sit with UE support and supervision for few seconds.  OT working on balance at end of session     Standing balance-Leahy Scale: Zero Standing balance comment: +2 max A & UE support for about 10-15 seconds for hygiene, cues for keeping legs from buckling                    Cognition Arousal/Alertness: Awake/alert Behavior During Therapy: WFL for tasks assessed/performed Overall Cognitive Status: Impaired/Different from baseline Area of Impairment: Memory Orientation Level: Disoriented to;Place;Time;Situation   Memory: Decreased short-term memory              Exercises General Exercises - Lower Extremity Long Arc Quad: AROM;Both;5 reps;Seated (x 2 sets) Hip Flexion/Marching: AROM;5 reps;Both;Seated (x 2 sets) Other Exercises Other Exercises: pt seated in recliner for reaching exercises to increase sitting balance/trunk strength    General Comments        Pertinent Vitals/Pain Pain Assessment: No/denies pain    Home Living                      Prior Function            PT Goals (current goals can now be found in the care plan section) Progress towards PT goals: Progressing toward goals    Frequency  Min 3X/week      PT Plan Discharge plan needs to be updated    Co-evaluation PT/OT/SLP Co-Evaluation/Treatment: Yes Reason for Co-Treatment: For patient/therapist safety PT goals addressed during session: Mobility/safety with mobility OT goals addressed during session: ADL's and self-care     End of Session Equipment Utilized During Treatment: Gait belt Activity Tolerance: Patient tolerated treatment well Patient left: in chair;with call bell/phone within reach;with chair alarm set;with family/visitor present     Time: [id number] PT Time Calculation (min) (ACUTE ONLY): 18 min  Charges:   $Therapeutic Activity: 8-22 mins                    G Codes:      Elray McgregorCynthia Hollace Michelli 11/16/2015, 2:27 PM  Sheran Lawlessyndi Oneida Mckamey, PT [phone] 11/16/2015

## 2015-11-16 NOTE — Progress Notes (Signed)
Speech Language Pathology Treatment: Dysphagia  Patient Details Name: Juan QuamWinifred D Voorhies MRN: [id number] DOB: [birth year] Today's Date: 11/16/2015 Time: 0940-1000 SLP Time Calculation (min) (ACUTE ONLY): 20 min  Assessment / Plan / Recommendation Clinical Impression  Pt tolerating diet well up in chair with slower less effortfly respiratory function today. Despite continuous straw sips, no change in RR after swallow, no signs of aspiration. Breathing swallowing pattern WNL. During meal pt was noted to pocket foods in her cheeks, mod verbal cues needed for intermittent liquid wash and to slow rate of intake. Son reports this has been her baseline. Offered strategies to reduce oral pocketing. Son verbalized understanding. Overall, pt is back to her baseline with no signs of aspiration. All education complete, will sign off.    HPI HPI: Sheran FavaWinifred D Grayis a 82 y.o.femalewith a medical history significant foatrial fibrillation, hypertension, chronic kidney disease stage IV, and diabetes.  Patient lives with her son at home who is her primary caretaker. She is dependent on him for most of her ADLs. 9/2619morning son was unable to get mother out of bed. Patient was nearly unresponsive, breathing was laboredprevious day was sleepy all day. In the emergency room found to have a temp of 103.8, tachycardic, tachypneic, hypoxic and borderline hypotensive. MD reports severe sepsis due to UTI with suspected aspiration pna after possible vomiting from sepsis. CXR shows Bibasilar pneumonia left side greater than right. Associated bibasilar subsegmental atelectasis. Son denies any difficulty swallowing.       SLP Plan  Continue with current plan of care     Recommendations  Diet recommendations: Regular;Thin liquid Liquids provided via: Cup;Straw Medication Administration: Whole meds with liquid Supervision: Trained caregiver to feed patient;Patient able to self feed (needs set up assist) Compensations: Slow  rate;Small sips/bites Postural Changes and/or Swallow Maneuvers: Out of bed for meals                Plan: Continue with current plan of care       GO                Sahar Ryback, Riley NearingBonnie Caroline 11/16/2015, 10:11 AM

## 2015-11-16 NOTE — NC FL2 (Signed)
[place] MEDICAID FL2 LEVEL OF CARE SCREENING TOOL     IDENTIFICATION  Patient Name: [name] Birthdate: [birth year] Sex: female Admission Date (Current Location): 11/13/2015  [facility] and [place] Number:  Producer, television/film/video and Address:  [facility], [address], [place], [place] [address]      Provider Number: [id number]  Attending Physician Name and Address:  Penny Pia, MD  Relative Name and Phone Number:  [name], son, [phone]    Current Level of Care: Hospital Recommended Level of Care: Skilled Nursing Facility Prior Approval Number:    Date Approved/Denied:   PASRR Number: [id number]  Discharge Plan: SNF    Current Diagnoses: Patient Active Problem List   Diagnosis Date Noted  . Severe sepsis (HCC)   . Aspiration pneumonia of right lower lobe due to vomit (HCC)   . Acute renal failure superimposed on stage 4 chronic kidney disease (HCC)   . Controlled diabetes mellitus type 2 with complications (HCC)   . Sepsis (HCC) 11/13/2015  . CAP (community acquired pneumonia) 11/13/2015  . Diabetes mellitus with complication (HCC)   . Calcaneal bursitis (heel) 09/11/2015  . S/P CABG (coronary artery bypass graft) 04/11/2015  . Leg pain, inferior 04/03/2015  . Pain in heel 04/03/2015  . CKD stage 4 due to type 2 diabetes mellitus (HCC) 04/25/2014  . Diabetic nephropathy associated with type 2 diabetes mellitus (HCC) 04/25/2014  . Contusion of foot 01/17/2014  . Diabetic neuropathy (HCC) 09/14/2013  . Pain in joint, ankle and foot 05/11/2013  . Weakness 10/07/2012  . AKI (acute kidney injury) (HCC) 10/07/2012  . GI AVM (gastrointestinal arteriovenous vascular malformation) 08/17/2012  . Gastritis and gastroduodenitis 08/17/2012  . GI bleed 08/14/2012  . HTN (hypertension) 08/14/2012  . Dementia 08/14/2012  . Altered mental status 08/14/2012  . UTI (lower urinary tract infection) 08/14/2012  . Anemia   . Hypothyroid   .  Hyperlipidemia   . Hyperpotassemia   . Paroxysmal atrial fibrillation (HCC)   . Hereditary and idiopathic peripheral neuropathy   . DM (diabetes mellitus), type 2 with renal complications (HCC)   . Hypercalcemia   . Cough 05/06/2011    Orientation RESPIRATION BLADDER Height & Weight     Self, Place  Normal Incontinent, Indwelling catheter (Urinary catheter) Weight: 78.9 kg (174 lb) Height:  5\' 7"  (170.2 cm)  BEHAVIORAL SYMPTOMS/MOOD NEUROLOGICAL BOWEL NUTRITION STATUS      Continent Diet (Please see DC Summary)  AMBULATORY STATUS COMMUNICATION OF NEEDS Skin   Extensive Assist Verbally Normal                       Personal Care Assistance Level of Assistance  Bathing, Feeding, Dressing Bathing Assistance: Maximum assistance Feeding assistance: Limited assistance Dressing Assistance: Limited assistance     Functional Limitations Info             SPECIAL CARE FACTORS FREQUENCY  PT (By licensed PT), OT (By licensed OT)     PT Frequency: 5x/week OT Frequency: 2x/week            Contractures      Additional Factors Info  Code Status, Allergies, Insulin Sliding Scale Code Status Info: DNR Allergies Info: Cosopt Dorzolamide Hcl-timolol Mal, Noroxin Norfloxacin, Sulfa Antibiotics, Prozac Fluoxetine Hcl   Insulin Sliding Scale Info: insulin aspart (novoLOG) injection 0-9 Units       Current Medications (11/16/2015):  This is the current hospital active medication list Current Facility-Administered Medications  Medication Dose Route Frequency Provider Last Rate Last Dose  . 0.9 %  sodium chloride infusion   Intravenous Continuous Meredith PelPaula M Guenther, NP 50 mL/hr at 11/15/15 0527    . acetaminophen (TYLENOL) tablet 650 mg  650 mg Oral Q6H PRN Meredith PelPaula M Guenther, NP       Or  . acetaminophen (TYLENOL) suppository 650 mg  650 mg Rectal Q6H PRN Meredith PelPaula M Guenther, NP      . Ampicillin-Sulbactam (UNASYN) 3 g in sodium chloride 0.9 % 100 mL IVPB  3 g Intravenous Q12H Para MarchNathan  J Batchelder, RPH   3 g at 11/16/15 1000  . aspirin EC tablet 81 mg  81 mg Oral Daily Meredith PelPaula M Guenther, NP   81 mg at 11/16/15 0953  . atorvastatin (LIPITOR) tablet 20 mg  20 mg Oral q1800 Meredith PelPaula M Guenther, NP   20 mg at 11/15/15 1810  . bisacodyl (DULCOLAX) EC tablet 5 mg  5 mg Oral Daily PRN Meredith PelPaula M Guenther, NP      . guaiFENesin [facility](MUCINEX) 12 hr tablet 600 mg  600 mg Oral BID Meredith PelPaula M Guenther, NP   600 mg at 11/16/15 0953  . heparin injection 5,000 Units  5,000 Units Subcutaneous Q8H Meredith PelPaula M Guenther, NP   5,000 Units at 11/16/15 1406  . insulin aspart (novoLOG) injection 0-9 Units  0-9 Units Subcutaneous TID WC Meredith PelPaula M Guenther, NP   3 Units at 11/16/15 1237  . ipratropium-albuterol (DUONEB) 0.5-2.5 (3) MG/3ML nebulizer solution 3 mL  3 mL Nebulization TID Drema Dallasurtis J Woods, MD   3 mL at 11/16/15 1443  . levothyroxine (SYNTHROID, LEVOTHROID) tablet 75 mcg  75 mcg Oral QAC breakfast Meredith PelPaula M Guenther, NP   75 mcg at 11/16/15 0846  . methylPREDNISolone sodium succinate (SOLU-MEDROL) 125 mg/2 mL injection 60 mg  60 mg Intravenous Q24H Drema Dallasurtis J Woods, MD   60 mg at 11/15/15 2116  . ondansetron (ZOFRAN) tablet 4 mg  4 mg Oral Q6H PRN Meredith PelPaula M Guenther, NP       Or  . ondansetron [facility](ZOFRAN) injection 4 mg  4 mg Intravenous Q6H PRN Meredith PelPaula M Guenther, NP      . oxyCODONE (Oxy IR/ROXICODONE) immediate release tablet 5 mg  5 mg Oral Q6H PRN Meredith PelPaula M Guenther, NP      . pantoprazole (PROTONIX) EC tablet 20 mg  20 mg Oral Daily Meredith PelPaula M Guenther, NP   20 mg at 11/16/15 0953  . polyethylene glycol (MIRALAX / GLYCOLAX) packet 17 g  17 g Oral Daily PRN Meredith PelPaula M Guenther, NP      . pregabalin (LYRICA) capsule 75 mg  75 mg Oral BID Meredith PelPaula M Guenther, NP   75 mg at 11/16/15 [id number]     Discharge Medications: Please see discharge summary for a list of discharge medications.  Relevant Imaging Results:  Relevant Lab Results:   Additional Information SSN: [id number]0651  Nathanyal Ashmead S [place] RockRayyan, [place]WA

## 2015-11-16 NOTE — Telephone Encounter (Signed)
Per fax from [facility] Diclofenac Sodium 1 % gel requires a prior authorization .  I called [phone] to initiate PA. Medication approved through 02/24/16  I called patient's pharmacy and left message informing them medication approved

## 2015-11-16 NOTE — Care Management Important Message (Signed)
Important Message  Patient Details  Name: Juan QuamWinifred D Delavega MRN: [id number] Date of Birth: [birth year]   Medicare Important Message Given:  Yes    Cailyn Houdek Stefan ChurchBratton 11/16/2015, 1:43 PM

## 2015-11-17 LAB — GLUCOSE, CAPILLARY
GLUCOSE-CAPILLARY: 128 mg/dL — AB (ref 65–99)
Glucose-Capillary: 227 mg/dL — ABNORMAL HIGH (ref 65–99)
Glucose-Capillary: 219 mg/dL — ABNORMAL HIGH (ref 65–99)
Glucose-Capillary: 160 mg/dL — ABNORMAL HIGH (ref 65–99)
Glucose-Capillary: 136 mg/dL — ABNORMAL HIGH (ref 65–99)

## 2015-11-17 MED ORDER — HYDRALAZINE HCL 20 MG/ML IJ SOLN
5.0000 mg | Freq: Once | INTRAMUSCULAR | Status: AC
  Administered 2015-11-17: 5 mg via INTRAVENOUS
  Filled 2015-11-17: qty 1

## 2015-11-17 NOTE — Progress Notes (Signed)
CSW spoke with patients son, Onalee HuaDavid, regarding discharge plans. [facility] was able to give bed offer and patients son would prefer patient to go to that facility.   Stacy GardnerErin Jamina Macbeth, LCSWA Clinical Social Worker [phone]

## 2015-11-17 NOTE — Progress Notes (Signed)
Patient's BP 173/92. On-call NP Lynch notified. Will continue to monitor patient and notify as needed.

## 2015-11-17 NOTE — Progress Notes (Signed)
PROGRESS NOTE    [name]  WUX:[id number] DOB: [birth year] DOA: 11/13/2015 PCP: Murray Hodgkins, MD Brief Narrative: [name] is a 82 y.o. female with a medical history significant for, but not  limited to, atrial fibrillation, hypertension, chronic kidney disease stage IV, and diabetes. Patient unable to provide history secondary to encephalopathy. Patient lives with her son at home who is her primary caretaker. She is dependent on him for most of her ADLs. 9/19 morning son was unable to get mother out of bed. Patient was nearly unresponsive, breathing was labored previous day was sleepy all day. In the emergency room found to have a temp of 103.8, tachycardic, tachypneic, hypoxic and borderline hypotensive  Assessment & Plan: 1. Severe sepsis -due to aspiration pneumonia vs UTI -clinically improving on Unasyn will continue for another day and consider de escalating next am with continued improvement in condition. Pt require supplemental oxygen -continue IVF, lactate improved -FU Blood and urine Cx - recommend mobilizing today  2. Possible subacute CVA on CT head -I discussed with patient and son, they do not want to pursue a stroke work up and this will not significantly change management at age 40 -hence appropriately defered stroke workup at this time -continue ASA/statin, will check SLP/OT/PT -On exam she has mild pronator drift of right upper extremity  3. AK I on CKD4 -Baseline creatinine of 1.9 this was 2.6 on admission, -Improving, continue IV fluids, -Monitor bmet and urine output  4. Diabetes mellitus -Hold metformin, continue sliding scale insulin - relatively well controlled  5. History of paroxysmal atrial fibrillation -Not on anticoagulation due to advanced age, fall risk, her [place] to rest score is 5 -Continue aspirin  6. Chronic debility/ long-standing bilateral lower extremity weakness -Disabled and wheelchair bound at baseline for over 5 years   DVT  prophylaxis:      SQ Heparin  Code Status:      DNR Family Communication: Discussed with son at bedside  Disposition Plan:   Discharge back home in a few days with improvement in condition  Antimicrobials:Vanc/Zosyn 9/19->  Subjective: Pt required placement on supplemental oxygen again overnight.  Objective: Vitals:   11/17/15 0614 11/17/15 0700 11/17/15 0804 11/17/15 1428  BP: (!) 173/92 (!) 170/88  (!) 146/73  Pulse: 93   (!) 52  Resp: 18   17  Temp: 99.3 F (37.4 C)   98.2 F (36.8 C)  TempSrc: Oral   Oral  SpO2: 99%  (!) 89% 97%  Weight: 75.7 kg (166 lb 14.2 oz)     Height:        Intake/Output Summary (Last 24 hours) at 11/17/15 1514 Last data filed at 11/17/15 1457  Gross per 24 hour  Intake             2350 ml  Output             1400 ml  Net              950 ml   Filed Weights   11/15/15 0558 11/16/15 0638 11/17/15 0614  Weight: 79.4 kg (175 lb 1.6 oz) 78.9 kg (174 lb) 75.7 kg (166 lb 14.2 oz)    Examination:  General exam: Appears calm and comfortable, in nad. Respiratory system: Clear to auscultation. Respiratory effort normal. Cardiovascular system: S1 & S2 heard, RRR. No JVD, murmurs, rubs, gallops or clicks. No pedal edema. Gastrointestinal system: Abdomen is nondistended, soft and nontender. No organomegaly or masses felt. Normal bowel sounds heard.  Central nervous system: Alert and oriented. No focal neurological deficits. Extremities: Symmetric 5 x 5 power. Skin: No rashes, lesions or ulcers Psychiatry: Judgement and insight appear normal. Mood & affect appropriate.     Data Reviewed: I have personally reviewed following labs and imaging studies  CBC:  Recent Labs Lab 11/13/15 0910 11/14/15 0648 11/15/15 1636  WBC 18.6* 13.6* 9.5  NEUTROABS 16.9*  --  6.6  HGB 11.3* 8.7* 9.5*  HCT 35.5* 29.0* 30.5*  MCV 97.8 101.0* 99.0  PLT 274 221 200   Basic Metabolic Panel:  Recent Labs Lab 11/13/15 0910 11/14/15 0648 11/15/15 1636  NA 141  141 140  K 4.8 4.3 4.3  CL 113* 118* 113*  CO2 18* 17* 18*  GLUCOSE 244* 118* 133*  BUN 54* 56* 37*  CREATININE 2.67* 2.48* 2.10*  CALCIUM 9.9 8.4* 9.3  MG  --   --  1.7   GFR: Estimated Creatinine Clearance: 16.2 mL/min (by C-G formula based on SCr of 2.1 mg/dL (H)). Liver Function Tests:  Recent Labs Lab 11/13/15 0910  AST 20  ALT 13*  ALKPHOS 96  BILITOT 0.7  PROT 6.3*  ALBUMIN 3.2*   No results for input(s): LIPASE, AMYLASE in the last 168 hours. No results for input(s): AMMONIA in the last 168 hours. Coagulation Profile: No results for input(s): INR, PROTIME in the last 168 hours. Cardiac Enzymes: No results for input(s): CKTOTAL, CKMB, CKMBINDEX, TROPONINI in the last 168 hours. BNP (last 3 results) No results for input(s): PROBNP in the last 8760 hours. HbA1C: No results for input(s): HGBA1C in the last 72 hours. CBG:  Recent Labs Lab 11/16/15 1212 11/16/15 1709 11/16/15 2122 11/17/15 0811 11/17/15 1209  GLUCAP 217* 216* 219* 227* 219*   Lipid Profile: No results for input(s): CHOL, HDL, LDLCALC, TRIG, CHOLHDL, LDLDIRECT in the last 72 hours. Thyroid Function Tests: No results for input(s): TSH, T4TOTAL, FREET4, T3FREE, THYROIDAB in the last 72 hours. Anemia Panel: No results for input(s): VITAMINB12, FOLATE, FERRITIN, TIBC, IRON, RETICCTPCT in the last 72 hours. Urine analysis:    Component Value Date/Time   COLORURINE YELLOW 11/13/2015 0927   APPEARANCEUR CLOUDY (A) 11/13/2015 0927   APPEARANCEUR Cloudy (A) 08/29/2014 1428   LABSPEC 1.025 11/13/2015 0927   PHURINE 5.0 11/13/2015 0927   GLUCOSEU NEGATIVE 11/13/2015 0927   HGBUR LARGE (A) 11/13/2015 0927   BILIRUBINUR NEGATIVE 11/13/2015 0927   BILIRUBINUR negative 12/13/2014 1217   BILIRUBINUR Negative 08/29/2014 1428   KETONESUR 15 (A) 11/13/2015 0927   PROTEINUR 100 (A) 11/13/2015 0927   UROBILINOGEN negative 12/13/2014 1217   UROBILINOGEN 0.2 07/12/2014 1251   NITRITE NEGATIVE 11/13/2015  0927   LEUKOCYTESUR LARGE (A) 11/13/2015 0927   LEUKOCYTESUR 3+ (A) 08/29/2014 1428   Sepsis Labs: @LABRCNTIP (procalcitonin:4,lacticidven:4)  ) Recent Results (from the past 240 hour(s))  Blood Culture (routine x 2)     Status: None (Preliminary result)   Collection Time: 11/13/15  9:10 AM  Result Value Ref Range Status   Specimen Description BLOOD RIGHT ANTECUBITAL  Final   Special Requests BOTTLES DRAWN AEROBIC AND ANAEROBIC 5CC  Final   Culture NO GROWTH 3 DAYS  Final   Report Status PENDING  Incomplete  Blood Culture (routine x 2)     Status: None (Preliminary result)   Collection Time: 11/13/15  9:25 AM  Result Value Ref Range Status   Specimen Description BLOOD RIGHT HAND  Final   Special Requests BOTTLES DRAWN AEROBIC AND ANAEROBIC 5CC  Final  Culture NO GROWTH 3 DAYS  Final   Report Status PENDING  Incomplete  Urine culture     Status: Abnormal   Collection Time: 11/13/15  9:27 AM  Result Value Ref Range Status   Specimen Description URINE, CATHETERIZED  Final   Special Requests NONE  Final   Culture MULTIPLE SPECIES PRESENT, SUGGEST RECOLLECTION (A)  Final   Report Status 11/14/2015 FINAL  Final  Culture, Urine     Status: None   Collection Time: 11/14/15 12:50 PM  Result Value Ref Range Status   Specimen Description URINE, CATHETERIZED  Final   Special Requests NONE  Final   Culture NO GROWTH  Final   Report Status 11/15/2015 FINAL  Final         Radiology Studies: No results found.      Scheduled Meds: . ampicillin-sulbactam (UNASYN) IV  3 g Intravenous Q12H  . aspirin EC  81 mg Oral Daily  . atorvastatin  20 mg Oral q1800  . guaiFENesin  600 mg Oral BID  . heparin  5,000 Units Subcutaneous Q8H  . insulin aspart  0-9 Units Subcutaneous TID WC  . levothyroxine  75 mcg Oral QAC breakfast  . methylPREDNISolone (SOLU-MEDROL) injection  60 mg Intravenous Q24H  . pantoprazole  20 mg Oral Daily  . pregabalin  75 mg Oral BID   Continuous Infusions: .  sodium chloride 50 mL/hr at 11/17/15 0018     LOS: 4 days    Time spent: 35 min  Penny PiaVEGA, Latonia Conrow  [facility] Pager [phone]  If 7PM-7AM, please contact night-coverage [email or web address] Password [email or web address] 11/17/2015, 3:14 PM

## 2015-11-18 ENCOUNTER — Inpatient Hospital Stay (HOSPITAL_COMMUNITY): Payer: Medicare Other

## 2015-11-18 DIAGNOSIS — N390 Urinary tract infection, site not specified: Secondary | ICD-10-CM

## 2015-11-18 LAB — BASIC METABOLIC PANEL
Anion gap: 12 (ref 5–15)
BUN: 27 mg/dL — AB (ref 6–20)
CALCIUM: 10.3 mg/dL (ref 8.9–10.3)
CO2: 16 mmol/L — AB (ref 22–32)
CREATININE: 1.62 mg/dL — AB (ref 0.44–1.00)
Chloride: 116 mmol/L — ABNORMAL HIGH (ref 101–111)
GFR calc Af Amer: 30 mL/min — ABNORMAL LOW (ref >60)
GFR, EST NON AFRICAN AMERICAN: 25 mL/min — AB (ref >60)
GLUCOSE: 204 mg/dL — AB (ref 65–99)
Potassium: 4.6 mmol/L (ref 3.5–5.1)
Sodium: 144 mmol/L (ref 135–145)

## 2015-11-18 LAB — CBC
HEMATOCRIT: 33.3 % — AB (ref 36.0–46.0)
Hemoglobin: 10.4 g/dL — ABNORMAL LOW (ref 12.0–15.0)
MCH: 31.3 pg (ref 26.0–34.0)
MCHC: 31.2 g/dL (ref 30.0–36.0)
MCV: 100.3 fL — AB (ref 78.0–100.0)
PLATELETS: 234 10*3/uL (ref 150–400)
RBC: 3.32 MIL/uL — ABNORMAL LOW (ref 3.87–5.11)
RDW: 15.5 % (ref 11.5–15.5)
WBC: 11.5 10*3/uL — ABNORMAL HIGH (ref 4.0–10.5)

## 2015-11-18 LAB — CULTURE, BLOOD (ROUTINE X 2)
CULTURE: NO GROWTH
Culture: NO GROWTH

## 2015-11-18 LAB — GLUCOSE, CAPILLARY
GLUCOSE-CAPILLARY: 214 mg/dL — AB (ref 65–99)
GLUCOSE-CAPILLARY: 188 mg/dL — AB (ref 65–99)
Glucose-Capillary: 187 mg/dL — ABNORMAL HIGH (ref 65–99)
Glucose-Capillary: 215 mg/dL — ABNORMAL HIGH (ref 65–99)

## 2015-11-18 LAB — BRAIN NATRIURETIC PEPTIDE: B Natriuretic Peptide: 4234.6 pg/mL — ABNORMAL HIGH (ref 0.0–100.0)

## 2015-11-18 LAB — TSH: TSH: 13.132 u[IU]/mL — AB (ref 0.350–4.500)

## 2015-11-18 LAB — TROPONIN I: Troponin I: 0.48 ng/mL (ref <0.03)

## 2015-11-18 MED ORDER — FUROSEMIDE 10 MG/ML IJ SOLN
40.0000 mg | Freq: Once | INTRAMUSCULAR | Status: AC
  Administered 2015-11-18: 40 mg via INTRAVENOUS
  Filled 2015-11-18: qty 4

## 2015-11-18 MED ORDER — METOPROLOL TARTRATE 5 MG/5ML IV SOLN
2.5000 mg | Freq: Once | INTRAVENOUS | Status: AC
  Administered 2015-11-18: 2.5 mg via INTRAVENOUS
  Filled 2015-11-18: qty 5

## 2015-11-18 MED ORDER — LEVOFLOXACIN IN D5W 500 MG/100ML IV SOLN
500.0000 mg | INTRAVENOUS | Status: DC
  Administered 2015-11-18: 500 mg via INTRAVENOUS
  Filled 2015-11-18: qty 100

## 2015-11-18 MED ORDER — LEVOFLOXACIN IN D5W 750 MG/150ML IV SOLN
750.0000 mg | INTRAVENOUS | Status: DC
Start: 2015-11-18 — End: 2015-11-18
  Filled 2015-11-18: qty 150

## 2015-11-18 MED ORDER — ZOLPIDEM TARTRATE 5 MG PO TABS
5.0000 mg | ORAL_TABLET | Freq: Once | ORAL | Status: AC
  Administered 2015-11-18: 5 mg via ORAL
  Filled 2015-11-18: qty 1

## 2015-11-18 NOTE — Progress Notes (Signed)
Pt went into rapid A.fib up to 160s. Pt asymptomatic, no complaints of pain. On call Schorr, NP paged and notified. Will continue to monitor pt.

## 2015-11-18 NOTE — Progress Notes (Signed)
Patient ID: [name], female   DOB: [birth year], 82 y.o.   MRN: [id number]                                                                PROGRESS NOTE                                                                                                                                                                                                             Patient Demographics:    [name], is a 82 y.o. female, DOB - 02-06-1917, WUJ:[id number]  Admit date - 11/13/2015   Admitting Physician Ozella Rocks, MD  Outpatient Primary MD for the patient is Murray Hodgkins, MD  LOS - 5  Outpatient Specialists:   Chief Complaint  Patient presents with  . Altered Mental Status    pt found by family to be altered this morning not responding        Brief Narrative  82 y.o.femalewith a medical history significant for, but not limited to,atrial fibrillation, hypertension, chronic kidney disease stage IV, and diabetes. Patient unable to provide history secondary to encephalopathy. Patient lives with her son at home who is her primary caretaker. She is dependent on him for most of her ADLs. 9/19 morning son was unable to get mother out of bed. Patient was nearly unresponsive, breathing was labored previous day was sleepy all day. In the emergency room found to have a temp of 103.8, tachycardic, tachypneic, hypoxic and borderline hypotensive   Subjective:    [name] today appears better, but slightly dyspneic according to her son.  Pt has been afebrile. Denies cp, palp, sob, n/v, diarrhea, brbpr, black stool.  Pt tolerating augmentin.   No headache, No chest pain, No abdominal pain - No Nausea, No new weakness tingling or numbness, No cough   Assessment  & Plan :    Principal Problem:   Sepsis (HCC) Active Problems:   Hypothyroid   Hyperlipidemia   Paroxysmal atrial fibrillation (HCC)   DM (diabetes mellitus), type 2 with renal complications (HCC)   HTN (hypertension)    Dementia   UTI (lower urinary tract infection)   CKD stage 4 due to type 2 diabetes mellitus (HCC)   CAP (community acquired pneumonia)   Diabetes mellitus with complication (HCC)  Severe sepsis (HCC)   Aspiration pneumonia of right lower lobe due to vomit (HCC)   Acute renal failure superimposed on stage 4 chronic kidney disease (HCC)   Controlled diabetes mellitus type 2 with complications (HCC)   Severe sepsis (blood culture ngtd, urine culture E. Coli sensitive to Augmentin, and levaquin) -due to aspiration pneumonia vs UTI -cont augmentin  Dyspnea CXR today Add levaquin pharmacy to dose  Possible subacute CVA on CT head Dr. Cena Benton had  discussed with patient and son, they do not want to pursue a stroke work up and this will not significantly change management at age 16 -hence appropriately defered stroke workup at this time -continue ASA/statin, will check SLP/OT/PT -On exam she has mild pronator drift of right upper extremity  AK I on CKD4 Baseline creatinine of 1.9 this was 2.6 on admission, Check cmp in am  Diabetes mellitus -Hold metformin, continue sliding scale insulin - relatively well controlled  History of paroxysmal atrial fibrillation -Not on anticoagulation due to advanced age, fall risk, her [place]asc score is 5 -Continue aspirin  6. Chronic debility/ long-standing bilateral lower extremity weakness -Disabled and wheelchair bound at baseline for over 5 years   DVT prophylaxis:SQ Heparin  Code Status:DNR Family Communication: Discussed with son at bedside  Disposition Plan:Discharge back home in a few days with improvement in condition  Antimicrobials:Vanc/Zosyn 9/19->9/21 Unasyn 9/21=> Levaquin 9/24=>    Lab Results  Component Value Date   PLT 234 11/18/2015      Anti-infectives    Start     Dose/Rate Route Frequency Ordered Stop   11/15/15 2300  Ampicillin-Sulbactam (UNASYN) 3 g in sodium chloride 0.9 % 100 mL IVPB      3 g 100 mL/hr over 60 Minutes Intravenous Every 12 hours 11/15/15 2018 11/17/15 2359   11/15/15 1000  vancomycin (VANCOCIN) IVPB 1000 mg/200 mL premix  Status:  Discontinued     1,000 mg 200 mL/hr over 60 Minutes Intravenous Every 48 hours 11/13/15 1000 11/15/15 2003   11/13/15 1730  piperacillin-tazobactam (ZOSYN) IVPB 2.25 g  Status:  Discontinued     2.25 g 100 mL/hr over 30 Minutes Intravenous Every 6 hours 11/13/15 1000 11/15/15 2003   11/13/15 0930  piperacillin-tazobactam (ZOSYN) IVPB 3.375 g     3.375 g 100 mL/hr over 30 Minutes Intravenous  Once 11/13/15 0917 11/13/15 0958   11/13/15 0930  vancomycin (VANCOCIN) IVPB 1000 mg/200 mL premix  Status:  Discontinued     1,000 mg 200 mL/hr over 60 Minutes Intravenous  Once 11/13/15 0917 11/13/15 0921   11/13/15 0930  vancomycin (VANCOCIN) 1,500 mg in sodium chloride 0.9 % 500 mL IVPB     1,500 mg 250 mL/hr over 120 Minutes Intravenous  Once 11/13/15 0921 11/13/15 1151        Objective:   Vitals:   11/17/15 2120 11/18/15 0542 11/18/15 0546 11/18/15 0703  BP: 123/63 (!) 161/88  (!) 147/78  Pulse: 91 100 (!) 102 83  Resp: 20 20 (!) 22   Temp: 98.1 F (36.7 C) 98.7 F (37.1 C)    TempSrc: Oral Oral    SpO2: 99% 94%    Weight:  77.2 kg (170 lb 3.1 oz)    Height:        Wt Readings from Last 3 Encounters:  11/18/15 77.2 kg (170 lb 3.1 oz)  09/11/15 74.4 kg (164 lb)  06/13/15 74.4 kg (164 lb)     Intake/Output Summary (Last 24 hours) at 11/18/15 1118 Last data  filed at 11/18/15 1039  Gross per 24 hour  Intake          1559.17 ml  Output             1150 ml  Net           409.17 ml     Physical Exam  Awake Alert, Oriented X 3, No new F.N deficits, Normal affect Schaller.AT,PERRAL Supple Neck,No JVD, No cervical lymphadenopathy appriciated.  Symmetrical Chest wall movement, Good air movement bilaterally, slight crackles bilateral bases, no wheezing RRR,No Gallops,Rubs or new Murmurs, No Parasternal Heave +ve  B.Sounds, Abd Soft, No tenderness, No organomegaly appriciated, No rebound - guarding or rigidity. No Cyanosis, Clubbing or edema, No new Rash or bruise     Data Review:    CBC  Recent Labs Lab 11/13/15 0910 11/14/15 0648 11/15/15 1636 11/18/15 0510  WBC 18.6* 13.6* 9.5 11.5*  HGB 11.3* 8.7* 9.5* 10.4*  HCT 35.5* 29.0* 30.5* 33.3*  PLT 274 221 200 234  MCV 97.8 101.0* 99.0 100.3*  MCH 31.1 30.3 30.8 31.3  MCHC 31.8 30.0 31.1 31.2  RDW 14.6 15.7* 15.1 15.5  LYMPHSABS 1.1  --  2.0  --   MONOABS 0.6  --  0.9  --   EOSABS 0.0  --  0.1  --   BASOSABS 0.0  --  0.0  --     Chemistries   Recent Labs Lab 11/13/15 0910 11/14/15 0648 11/15/15 1636 11/18/15 0510  NA 141 141 140 144  K 4.8 4.3 4.3 4.6  CL 113* 118* 113* 116*  CO2 18* 17* 18* 16*  GLUCOSE 244* 118* 133* 204*  BUN 54* 56* 37* 27*  CREATININE 2.67* 2.48* 2.10* 1.62*  CALCIUM 9.9 8.4* 9.3 10.3  MG  --   --  1.7  --   AST 20  --   --   --   ALT 13*  --   --   --   ALKPHOS 96  --   --   --   BILITOT 0.7  --   --   --    ------------------------------------------------------------------------------------------------------------------ No results for input(s): CHOL, HDL, LDLCALC, TRIG, CHOLHDL, LDLDIRECT in the last 72 hours.  Lab Results  Component Value Date   HGBA1C 6.5 (H) 09/07/2015   ------------------------------------------------------------------------------------------------------------------ No results for input(s): TSH, T4TOTAL, T3FREE, THYROIDAB in the last 72 hours.  Invalid input(s): FREET3 ------------------------------------------------------------------------------------------------------------------ No results for input(s): VITAMINB12, FOLATE, FERRITIN, TIBC, IRON, RETICCTPCT in the last 72 hours.  Coagulation profile No results for input(s): INR, PROTIME in the last 168 hours.  No results for input(s): DDIMER in the last 72 hours.  Cardiac Enzymes No results for input(s): CKMB,  TROPONINI, MYOGLOBIN in the last 168 hours.  Invalid input(s): CK ------------------------------------------------------------------------------------------------------------------ No results found for: BNP  Inpatient Medications  Scheduled Meds: . aspirin EC  81 mg Oral Daily  . atorvastatin  20 mg Oral q1800  . guaiFENesin  600 mg Oral BID  . heparin  5,000 Units Subcutaneous Q8H  . insulin aspart  0-9 Units Subcutaneous TID WC  . levothyroxine  75 mcg Oral QAC breakfast  . pantoprazole  20 mg Oral Daily  . pregabalin  75 mg Oral BID   Continuous Infusions: . sodium chloride 50 mL/hr at 11/18/15 0241   PRN Meds:.acetaminophen **OR** acetaminophen, bisacodyl, ipratropium-albuterol, ondansetron **OR** ondansetron (ZOFRAN) IV, oxyCODONE, polyethylene glycol  Micro Results Recent Results (from the past 240 hour(s))  Blood Culture (routine x 2)  Status: None (Preliminary result)   Collection Time: 11/13/15  9:10 AM  Result Value Ref Range Status   Specimen Description BLOOD RIGHT ANTECUBITAL  Final   Special Requests BOTTLES DRAWN AEROBIC AND ANAEROBIC 5CC  Final   Culture NO GROWTH 4 DAYS  Final   Report Status PENDING  Incomplete  Blood Culture (routine x 2)     Status: None (Preliminary result)   Collection Time: 11/13/15  9:25 AM  Result Value Ref Range Status   Specimen Description BLOOD RIGHT HAND  Final   Special Requests BOTTLES DRAWN AEROBIC AND ANAEROBIC 5CC  Final   Culture NO GROWTH 4 DAYS  Final   Report Status PENDING  Incomplete  Urine culture     Status: Abnormal   Collection Time: 11/13/15  9:27 AM  Result Value Ref Range Status   Specimen Description URINE, CATHETERIZED  Final   Special Requests NONE  Final   Culture MULTIPLE SPECIES PRESENT, SUGGEST RECOLLECTION (A)  Final   Report Status 11/14/2015 FINAL  Final  Culture, Urine     Status: None   Collection Time: 11/14/15 12:50 PM  Result Value Ref Range Status   Specimen Description URINE,  CATHETERIZED  Final   Special Requests NONE  Final   Culture NO GROWTH  Final   Report Status 11/15/2015 FINAL  Final    Radiology Reports Ct Head Wo Contrast  Result Date: 11/13/2015 CLINICAL DATA:  Altered mental status, unresponsive EXAM: CT HEAD WITHOUT CONTRAST TECHNIQUE: Contiguous axial images were obtained from the base of the skull through the vertex without intravenous contrast. COMPARISON:  CT brain of 10/08/2012 FINDINGS: Brain: The ventricular system remains dilated, and there are diffusely prominent cortical sulci, consistent with diffuse atrophy. Moderate to severe small vessel ischemic change is again noted throughout the periventricular white matter. There is low-attenuation rounded structure within the anterior limb of the internal capsule on the left which may represent acute or subacute lacunar infarct. No hemorrhage, mass lesion, or other area of infarction is seen. Vascular: No vascular abnormality is seen on this unenhanced study. Skull: No calvarial abnormality is seen. Sinuses/Orbits: There is complete opacification with expansion of the right partition of the sphenoid sinus which appears relatively unchanged compared to the prior CT. This may be due to chronic sinusitis but clinical correlation is recommended. This opacification of the right sphenoid sinus measures 51 HU which could be due to proteinaceous debris although a soft tissue lesion cannot be excluded. If further assessment is warranted, MR would be recommended. Other: None IMPRESSION: 1. Stable moderate atrophy and moderate to severe small vessel ischemic change throughout the periventricular white matter. 2. Small low-attenuation structure in the anterior limb of the left internal capsule not definitely seen previously. Consider acute or subacute lacunar infarct. 3. Complete opacification of the right partition of the sphenoid sinus may be due to sinusitis possibly chronic but soft tissue lesion cannot be excluded as  noted above. Electronically Signed   By: Dwyane Dee M.D.   On: 11/13/2015 10:31   Dg Chest Port 1 View  Result Date: 11/13/2015 CLINICAL DATA:  Altered mental status.  Possible sepsis. EXAM: PORTABLE CHEST 1 VIEW COMPARISON:  10/07/2012 . FINDINGS: Prior CABG. Heart size stable. Bibasilar infiltrates left side greater than right noted these findings consist with pneumonia. Component basilar atelectasis noted. Small left pleural effusion. IMPRESSION: 1. Bibasilar pneumonia left side greater than right. Associated bibasilar subsegmental atelectasis. Small left pleural effusion. 2. Prior CABG.  Heart size stable.  No pulmonary venous congestion . Electronically Signed   By: Maisie Fus  Register   On: 11/13/2015 09:39    Time Spent in minutes  30   Pearson Grippe M.D on 11/18/2015 at 11:18 AM  Between 7am to 7pm - Pager - [phone]  After 7pm go to [email or web address] - password [facility]  [facility] -  Office  [phone]

## 2015-11-18 NOTE — Progress Notes (Signed)
Pharmacy Antibiotic Note  [name] is a 82 y.o. female admitted on 11/13/2015 with sepsis, now being treated for CAP. Unasyn has been changed to levaquin (dosed per pharmacy). Remains afebrile, WBC up a little to 11.5 (from 9.5 yesterday). SCr trending down - currently 1.62 (baseline ~1.9). Cultures negative so far.  Plan: Levauin 500mg  q48h Monitor clinical picture, renal function F/U C&S, LOT   Height: 5\' 7"  (170.2 cm) Weight: 170 lb 3.1 oz (77.2 kg) IBW/kg (Calculated) : 61.6  Temp (24hrs), Avg:98.3 F (36.8 C), Min:98.1 F (36.7 C), Max:98.7 F (37.1 C)   Recent Labs Lab 11/13/15 0910 11/13/15 0930 11/13/15 1337 11/14/15 0648 11/15/15 1636 11/18/15 0510  WBC 18.6*  --   --  13.6* 9.5 11.5*  CREATININE 2.67*  --   --  2.48* 2.10* 1.62*  LATICACIDVEN  --  1.86 1.68  --   --   --     Estimated Creatinine Clearance: 21.2 mL/min (by C-G formula based on SCr of 1.62 mg/dL (H)).    Allergies  Allergen Reactions  . Cosopt [Dorzolamide Hcl-Timolol Mal] Other (See Comments)    Redness and burning in both eyes  . Noroxin [Norfloxacin] Swelling  . Sulfa Antibiotics Nausea And Vomiting  . Prozac [Fluoxetine Hcl] Rash    Antimicrobials this admission: Levaquin 9/24 >> Unasyn 9/21 >> 9/23 Vanc 9/19>>9/21 Zosyn 9/19>>9/21   Microbiology results: 9/19 BCx: NGTDx4 days 9/19 UCx: multiple species 9/20 UCx: NGF  Thank you for allowing pharmacy to be a part of this patient's care.  Alfredo BachJoseph Neriah Brott, Cleotis NipperBS, PharmD Clinical Pharmacy Resident [phone] (Pager) 11/18/2015 11:52 AM

## 2015-11-19 DIAGNOSIS — N179 Acute kidney failure, unspecified: Secondary | ICD-10-CM

## 2015-11-19 LAB — CBC
HEMATOCRIT: 31.3 % — AB (ref 36.0–46.0)
Hemoglobin: 9.7 g/dL — ABNORMAL LOW (ref 12.0–15.0)
MCH: 30.8 pg (ref 26.0–34.0)
MCHC: 31 g/dL (ref 30.0–36.0)
MCV: 99.4 fL (ref 78.0–100.0)
Platelets: 253 10*3/uL (ref 150–400)
RBC: 3.15 MIL/uL — ABNORMAL LOW (ref 3.87–5.11)
RDW: 15.4 % (ref 11.5–15.5)
WBC: 12 10*3/uL — ABNORMAL HIGH (ref 4.0–10.5)

## 2015-11-19 LAB — GLUCOSE, CAPILLARY
GLUCOSE-CAPILLARY: 195 mg/dL — AB (ref 65–99)
GLUCOSE-CAPILLARY: 127 mg/dL — AB (ref 65–99)
GLUCOSE-CAPILLARY: 174 mg/dL — AB (ref 65–99)
Glucose-Capillary: 190 mg/dL — ABNORMAL HIGH (ref 65–99)

## 2015-11-19 LAB — COMPREHENSIVE METABOLIC PANEL
ALT: 22 U/L (ref 14–54)
ANION GAP: 8 (ref 5–15)
AST: 21 U/L (ref 15–41)
Albumin: 2.7 g/dL — ABNORMAL LOW (ref 3.5–5.0)
Alkaline Phosphatase: 78 U/L (ref 38–126)
BILIRUBIN TOTAL: 0.9 mg/dL (ref 0.3–1.2)
BUN: 27 mg/dL — ABNORMAL HIGH (ref 6–20)
CO2: 20 mmol/L — ABNORMAL LOW (ref 22–32)
Calcium: 10.5 mg/dL — ABNORMAL HIGH (ref 8.9–10.3)
Chloride: 114 mmol/L — ABNORMAL HIGH (ref 101–111)
Creatinine, Ser: 1.78 mg/dL — ABNORMAL HIGH (ref 0.44–1.00)
GFR calc Af Amer: 26 mL/min — ABNORMAL LOW (ref >60)
GFR, EST NON AFRICAN AMERICAN: 23 mL/min — AB (ref >60)
Glucose, Bld: 191 mg/dL — ABNORMAL HIGH (ref 65–99)
POTASSIUM: 4.1 mmol/L (ref 3.5–5.1)
Sodium: 142 mmol/L (ref 135–145)
TOTAL PROTEIN: 6.2 g/dL — AB (ref 6.5–8.1)

## 2015-11-19 LAB — TROPONIN I
TROPONIN I: 0.51 ng/mL — AB (ref <0.03)
TROPONIN I: 0.49 ng/mL — AB (ref <0.03)

## 2015-11-19 MED ORDER — POTASSIUM CHLORIDE CRYS ER 20 MEQ PO TBCR
40.0000 meq | EXTENDED_RELEASE_TABLET | Freq: Every day | ORAL | Status: DC
  Administered 2015-11-19 – 2015-11-21 (×3): 40 meq via ORAL
  Filled 2015-11-19 (×3): qty 2

## 2015-11-19 MED ORDER — FUROSEMIDE 10 MG/ML IJ SOLN
40.0000 mg | Freq: Two times a day (BID) | INTRAMUSCULAR | Status: DC
  Administered 2015-11-19 – 2015-11-20 (×3): 40 mg via INTRAVENOUS
  Filled 2015-11-19 (×3): qty 4

## 2015-11-19 MED ORDER — INSULIN ASPART 100 UNIT/ML ~~LOC~~ SOLN
0.0000 [IU] | Freq: Three times a day (TID) | SUBCUTANEOUS | Status: DC
  Administered 2015-11-19: 3 [IU] via SUBCUTANEOUS
  Administered 2015-11-19: 2 [IU] via SUBCUTANEOUS
  Administered 2015-11-20: 3 [IU] via SUBCUTANEOUS
  Administered 2015-11-20 – 2015-11-21 (×2): 5 [IU] via SUBCUTANEOUS
  Administered 2015-11-21: 3 [IU] via SUBCUTANEOUS

## 2015-11-19 MED ORDER — LEVOFLOXACIN 500 MG PO TABS
500.0000 mg | ORAL_TABLET | ORAL | Status: AC
  Administered 2015-11-20: 500 mg via ORAL
  Filled 2015-11-19: qty 1

## 2015-11-19 MED ORDER — METOPROLOL TARTRATE 5 MG/5ML IV SOLN
5.0000 mg | Freq: Once | INTRAVENOUS | Status: AC
  Administered 2015-11-19: 5 mg via INTRAVENOUS
  Filled 2015-11-19: qty 5

## 2015-11-19 MED ORDER — LEVOTHYROXINE SODIUM 75 MCG PO TABS
150.0000 ug | ORAL_TABLET | Freq: Every day | ORAL | Status: DC
  Administered 2015-11-19 – 2015-11-21 (×3): 150 ug via ORAL
  Filled 2015-11-19 (×4): qty 2

## 2015-11-19 NOTE — Care Management Important Message (Signed)
Important Message  Patient Details  Name: Tammy QuamWinifred D [name] MRN: [id number] Date of Birth: [birth year]   Medicare Important Message Given:  Yes    [name] 11/19/2015, 11:26 AM

## 2015-11-19 NOTE — Progress Notes (Addendum)
PROGRESS NOTE    [name]  NWG:[id number] DOB: 06-24-1916 DOA: 11/13/2015 PCP: Murray Hodgkins, MD Brief Narrative: [name] is a 82 y.o. female with a medical history significant for, but not  limited to, atrial fibrillation, hypertension, chronic kidney disease stage IV, and diabetes. Patient unable to provide history secondary to encephalopathy. Patient lives with her son at home who is her primary caretaker. She is dependent on him for most of her ADLs. 9/19 morning son was unable to get mother out of bed. Patient was nearly unresponsive, breathing was labored previous day was sleepy all day. In the emergency room found to have a temp of 103.8, tachycardic, tachypneic, hypoxic and borderline hypotensive. Sepsis due to UTI+/- Pneumonia, Improved with broad spectrum Abx to treat UTI and pneumonia Now with volume overload  Assessment & Plan: 1. Severe sepsis -due to aspiration pneumonia vs UTI -clinically improvied on IV VAnc/Zosyn -sepsis physiology resolved -Blood and urine Cx negative -changed to Levaquin keep for 7-10days of total Abx  2. Volume overload/elevated troponin/demand ischemia -iatrogenic volume overload from Rx of sepsis/IVF etc -CXR with CHF and small effusions -lasix 40mg  Q12 today and monitor  3. Possible subacute CVA on CT head noted on admission -I discussed with patient and son, they do not want to pursue a stroke work and this will not significantly change management at age 89 -hence appropriately defered stroke workup at this time -continue ASA/statin, completed SLP/OT/PT eval, plan for SNF at discharge -On exam she has mild pronator drift of right upper extremity  3. AK I on CKD4 -Baseline creatinine of 1.9 this was 2.6 on admission, -Improved with IVF now off, monitor with diuresis  4. Diabetes mellitus -Hold metformin, continue sliding scale insulin  5. History of paroxysmal atrial fibrillation -Not on anticoagulation due to advanced age,  fall risk, her Chadvasc score is 5 -Continue aspirin  6. Chronic debility/ long-standing bilateral lower extremity weakness -Disabled and wheelchair bound at baseline for over 5 years  7. Hypothyroidism -TSH very high at 13, will double synthroid dose to  DVT prophylaxis:      SQ Heparin  Code Status:      DNR Family Communication: Discussed with son at bedside  Disposition Plan:   SNF in few days when resp status stable  Antimicrobials:Vanc/Zosyn 9/19->  Subjective: Still with dyspnea and cough  Objective: Vitals:   11/19/15 0407 11/19/15 0541 11/19/15 0636 11/19/15 0704  BP: (!) 127/98 119/82 135/68   Pulse:   80   Resp:   12   Temp:   97.8 F (36.6 C)   TempSrc:   Oral   SpO2:   96%   Weight:    82.4 kg (181 lb 10.5 oz)  Height:        Intake/Output Summary (Last 24 hours) at 11/19/15 1109 Last data filed at 11/19/15 0749  Gross per 24 hour  Intake             1600 ml  Output              750 ml  Net              850 ml   Filed Weights   11/17/15 0614 11/18/15 0542 11/19/15 0704  Weight: 75.7 kg (166 lb 14.2 oz) 77.2 kg (170 lb 3.1 oz) 82.4 kg (181 lb 10.5 oz)    Examination:  General exam: Appears calm and comfortable, AAOx3 Respiratory system: crackles at bases Cardiovascular system: S1 & S2  heard, RRR. No JVD, murmurs, rubs, gallops or clicks.  Gastrointestinal system: Abdomen is nondistended, soft and nontender. No organomegaly or masses felt. Normal bowel sounds heard. Central nervous system: Alert and oriented. No focal neurological deficits. Extremities: 2 plus edema, chronically weak lower legs Skin: No rashes, lesions or ulcers Psychiatry: Judgement and insight appear normal. Mood & affect appropriate.     Data Reviewed: I have personally reviewed following labs and imaging studies  CBC:  Recent Labs Lab 11/13/15 0910 11/14/15 0648 11/15/15 1636 11/18/15 0510 11/19/15 0654  WBC 18.6* 13.6* 9.5 11.5* 12.0*  NEUTROABS 16.9*  --   6.6  --   --   HGB 11.3* 8.7* 9.5* 10.4* 9.7*  HCT 35.5* 29.0* 30.5* 33.3* 31.3*  MCV 97.8 101.0* 99.0 100.3* 99.4  PLT 274 221 200 234 253   Basic Metabolic Panel:  Recent Labs Lab 11/13/15 0910 11/14/15 0648 11/15/15 1636 11/18/15 0510 11/19/15 0654  NA 141 141 140 144 142  K 4.8 4.3 4.3 4.6 4.1  CL 113* 118* 113* 116* 114*  CO2 18* 17* 18* 16* 20*  GLUCOSE 244* 118* 133* 204* 191*  BUN 54* 56* 37* 27* 27*  CREATININE 2.67* 2.48* 2.10* 1.62* 1.78*  CALCIUM 9.9 8.4* 9.3 10.3 10.5*  MG  --   --  1.7  --   --    GFR: Estimated Creatinine Clearance: 19.9 mL/min (by C-G formula based on SCr of 1.78 mg/dL (H)). Liver Function Tests:  Recent Labs Lab 11/13/15 0910 11/19/15 0654  AST 20 21  ALT 13* 22  ALKPHOS 96 78  BILITOT 0.7 0.9  PROT 6.3* 6.2*  ALBUMIN 3.2* 2.7*   No results for input(s): LIPASE, AMYLASE in the last 168 hours. No results for input(s): AMMONIA in the last 168 hours. Coagulation Profile: No results for input(s): INR, PROTIME in the last 168 hours. Cardiac Enzymes:  Recent Labs Lab 11/18/15 1933 11/19/15 0105 11/19/15 0654  TROPONINI 0.48* 0.51* 0.49*   BNP (last 3 results) No results for input(s): PROBNP in the last 8760 hours. HbA1C: No results for input(s): HGBA1C in the last 72 hours. CBG:  Recent Labs Lab 11/18/15 0808 11/18/15 1123 11/18/15 1636 11/18/15 2055 11/19/15 0805  GLUCAP 187* 214* 188* 215* 190*   Lipid Profile: No results for input(s): CHOL, HDL, LDLCALC, TRIG, CHOLHDL, LDLDIRECT in the last 72 hours. Thyroid Function Tests:  Recent Labs  11/18/15 1933  TSH 13.132*   Anemia Panel: No results for input(s): VITAMINB12, FOLATE, FERRITIN, TIBC, IRON, RETICCTPCT in the last 72 hours. Urine analysis:    Component Value Date/Time   COLORURINE YELLOW 11/13/2015 0927   APPEARANCEUR CLOUDY (A) 11/13/2015 0927   APPEARANCEUR Cloudy (A) 08/29/2014 1428   LABSPEC 1.025 11/13/2015 0927   PHURINE 5.0 11/13/2015 0927     GLUCOSEU NEGATIVE 11/13/2015 0927   HGBUR LARGE (A) 11/13/2015 0927   BILIRUBINUR NEGATIVE 11/13/2015 0927   BILIRUBINUR negative 12/13/2014 1217   BILIRUBINUR Negative 08/29/2014 1428   KETONESUR 15 (A) 11/13/2015 0927   PROTEINUR 100 (A) 11/13/2015 0927   UROBILINOGEN negative 12/13/2014 1217   UROBILINOGEN 0.2 07/12/2014 1251   NITRITE NEGATIVE 11/13/2015 0927   LEUKOCYTESUR LARGE (A) 11/13/2015 0927   LEUKOCYTESUR 3+ (A) 08/29/2014 1428   Sepsis Labs: @LABRCNTIP (procalcitonin:4,lacticidven:4)  ) Recent Results (from the past 240 hour(s))  Blood Culture (routine x 2)     Status: None   Collection Time: 11/13/15  9:10 AM  Result Value Ref Range Status   Specimen Description BLOOD  RIGHT ANTECUBITAL  Final   Special Requests BOTTLES DRAWN AEROBIC AND ANAEROBIC 5CC  Final   Culture NO GROWTH 5 DAYS  Final   Report Status 11/18/2015 FINAL  Final  Blood Culture (routine x 2)     Status: None   Collection Time: 11/13/15  9:25 AM  Result Value Ref Range Status   Specimen Description BLOOD RIGHT HAND  Final   Special Requests BOTTLES DRAWN AEROBIC AND ANAEROBIC 5CC  Final   Culture NO GROWTH 5 DAYS  Final   Report Status 11/18/2015 FINAL  Final  Urine culture     Status: Abnormal   Collection Time: 11/13/15  9:27 AM  Result Value Ref Range Status   Specimen Description URINE, CATHETERIZED  Final   Special Requests NONE  Final   Culture MULTIPLE SPECIES PRESENT, SUGGEST RECOLLECTION (A)  Final   Report Status 11/14/2015 FINAL  Final  Culture, Urine     Status: None   Collection Time: 11/14/15 12:50 PM  Result Value Ref Range Status   Specimen Description URINE, CATHETERIZED  Final   Special Requests NONE  Final   Culture NO GROWTH  Final   Report Status 11/15/2015 FINAL  Final         Radiology Studies: Dg Chest 2 View  Result Date: 11/18/2015 CLINICAL DATA:  82 year old female with a history of cough EXAM: CHEST  2 VIEW COMPARISON:  11/13/2015, 10/07/2012 FINDINGS:  Cardiomediastinal silhouette unchanged. Calcifications of the aortic arch. Surgical changes of prior median sternotomy and CABG. Prominence of central vasculature with interlobular septal thickening of the bilateral lungs. Low lung volumes with blunting of the left costophrenic angle. No pneumothorax. Thickening of the minor fissure. Blunting of the costophrenic sulcus on the lateral view No displaced fracture. Osteopenia. Surgical changes of prior vertebral augmentation. No new acute fracture identified. IMPRESSION: Evidence of congestive heart failure with small pleural effusions. Surgical changes of median sternotomy and CABG. Signed, Yvone Neu. Loreta Ave, DO [facility] Electronically Signed   By: Gilmer Mor D.O.   On: 11/18/2015 17:23        Scheduled Meds: . aspirin EC  81 mg Oral Daily  . atorvastatin  20 mg Oral q1800  . furosemide  40 mg Intravenous Q12H  . guaiFENesin  600 mg Oral BID  . heparin  5,000 Units Subcutaneous Q8H  . insulin aspart  0-15 Units Subcutaneous TID WC  . levofloxacin (LEVAQUIN) IV  500 mg Intravenous Q48H  . levothyroxine  150 mcg Oral QAC breakfast  . pantoprazole  20 mg Oral Daily  . potassium chloride  40 mEq Oral Daily  . pregabalin  75 mg Oral BID   Continuous Infusions:     LOS: 6 days    Time spent:    Zannie Cove, MD [facility] Pager [phone]  If 7PM-7AM, please contact night-coverage [email or web address] Password [email or web address] 11/19/2015, 11:09 AM

## 2015-11-19 NOTE — Progress Notes (Signed)
PHARMACIST - PHYSICIAN COMMUNICATION DR:   Jomarie LongsJoseph CONCERNING: Antibiotic IV to Oral Route Change Policy  RECOMMENDATION: This patient is receiving Levaquin by the intravenous route.  Based on criteria approved by the Pharmacy and Therapeutics Committee, the antibiotic is being converted to the equivalent oral dose form(s).  Today is day 7 of antibiotics, afebrile, WBC down to normal, cultures ngtd.  Consider stopping after tomorrow's dose Pharmacy signing off - please re-consult if needed.  Loura BackJennifer Grenora, PharmD, BCPS Clinical Pharmacist Phone for today [phone] Main pharmacy - [phone] 11/19/2015 1:00 PM    DESCRIPTION: These criteria include:  Patient being treated for a respiratory tract infection  The patient is not neutropenic and does not exhibit a GI malabsorption state  The patient is eating (either orally or via tube) and/or has been taking other orally administered medications for a least 24 hours  The patient is improving clinically and has a Tmax < 100.5  If you have questions about this conversion, please contact the Pharmacy Department  []   [phone]( [phone] )  [facility] []   [phone]( [phone] )  [facility] [x]   [phone]( [phone] )  [facility] []   [phone]( [phone] )  [facility] []   [phone]( [phone] )  [facility]

## 2015-11-19 NOTE — Progress Notes (Signed)
Inpatient Diabetes Program Recommendations  AACE/ADA: New Consensus Statement on Inpatient Glycemic Control (2015)  Target Ranges:  Prepandial:   less than 140 mg/dL      Peak postprandial:   less than 180 mg/dL (1-2 hours)      Critically ill patients:  140 - 180 mg/dL   Lab Results  Component Value Date   GLUCAP 190 (H) 11/19/2015   HGBA1C 6.5 (H) 09/07/2015    Review of Glycemic Control  Diabetes history: DM 2 Outpatient Diabetes medications: Metformin 1,000 mg BID Current orders for Inpatient glycemic control: Novolog Sensitive TID  Inpatient Diabetes Program Recommendations: Correction (SSI): Glucose range 180-215 mg/dl. Please consider increasing correction scale to Novolog Moderate TID.  Thanks,  Christena DeemShannon Yolanda Dockendorf RN, MSN, [facility] Inpatient Diabetes Coordinator Team Pager [phone] (8a-5p)

## 2015-11-20 ENCOUNTER — Ambulatory Visit (HOSPITAL_COMMUNITY): Payer: Medicare Other | Attending: Internal Medicine

## 2015-11-20 DIAGNOSIS — E119 Type 2 diabetes mellitus without complications: Secondary | ICD-10-CM | POA: Insufficient documentation

## 2015-11-20 DIAGNOSIS — R0600 Dyspnea, unspecified: Secondary | ICD-10-CM

## 2015-11-20 DIAGNOSIS — J690 Pneumonitis due to inhalation of food and vomit: Secondary | ICD-10-CM

## 2015-11-20 DIAGNOSIS — I4891 Unspecified atrial fibrillation: Secondary | ICD-10-CM | POA: Insufficient documentation

## 2015-11-20 LAB — BASIC METABOLIC PANEL
ANION GAP: 9 (ref 5–15)
BUN: 31 mg/dL — ABNORMAL HIGH (ref 6–20)
CALCIUM: 10.8 mg/dL — AB (ref 8.9–10.3)
CO2: 22 mmol/L (ref 22–32)
Chloride: 113 mmol/L — ABNORMAL HIGH (ref 101–111)
Creatinine, Ser: 1.98 mg/dL — ABNORMAL HIGH (ref 0.44–1.00)
GFR, EST AFRICAN AMERICAN: 23 mL/min — AB (ref >60)
GFR, EST NON AFRICAN AMERICAN: 20 mL/min — AB (ref >60)
Glucose, Bld: 182 mg/dL — ABNORMAL HIGH (ref 65–99)
POTASSIUM: 4 mmol/L (ref 3.5–5.1)
SODIUM: 144 mmol/L (ref 135–145)

## 2015-11-20 LAB — GLUCOSE, CAPILLARY
GLUCOSE-CAPILLARY: 202 mg/dL — AB (ref 65–99)
GLUCOSE-CAPILLARY: 98 mg/dL (ref 65–99)
Glucose-Capillary: 170 mg/dL — ABNORMAL HIGH (ref 65–99)
Glucose-Capillary: 154 mg/dL — ABNORMAL HIGH (ref 65–99)

## 2015-11-20 LAB — CBC
HCT: 33.3 % — ABNORMAL LOW (ref 36.0–46.0)
HEMOGLOBIN: 10.3 g/dL — AB (ref 12.0–15.0)
MCH: 30.5 pg (ref 26.0–34.0)
MCHC: 30.9 g/dL (ref 30.0–36.0)
MCV: 98.5 fL (ref 78.0–100.0)
Platelets: 272 10*3/uL (ref 150–400)
RBC: 3.38 MIL/uL — AB (ref 3.87–5.11)
RDW: 15.1 % (ref 11.5–15.5)
WBC: 10.2 10*3/uL (ref 4.0–10.5)

## 2015-11-20 LAB — ECHOCARDIOGRAM COMPLETE
HEIGHTINCHES: 67 in
Weight: 2783.09 oz

## 2015-11-20 MED ORDER — LEVOTHYROXINE SODIUM 75 MCG PO TABS: 150.0000 ug | ORAL_TABLET | Freq: Every day | ORAL | Status: DC

## 2015-11-20 MED ORDER — FUROSEMIDE 20 MG PO TABS
20.0000 mg | ORAL_TABLET | Freq: Every day | ORAL | Status: AC
  Filled 2015-11-20: qty 1

## 2015-11-20 MED ORDER — FUROSEMIDE 20 MG PO TABS: 20.0000 mg | ORAL_TABLET | Freq: Every day | ORAL | Status: DC

## 2015-11-20 MED ORDER — PREGABALIN 75 MG PO CAPS: 75.0000 mg | ORAL_CAPSULE | Freq: Two times a day (BID) | ORAL | Status: DC

## 2015-11-20 NOTE — Progress Notes (Signed)
  Echocardiogram 2D Echocardiogram has been performed.  Arvil ChacoFoster, [place]ar 11/20/2015, 3:19 PM

## 2015-11-20 NOTE — Progress Notes (Signed)
PROGRESS NOTE    Tammy QuamWinifred D Solum  VHQ:[id number] DOB: 03/16/1916 DOA: 11/13/2015 PCP: Murray HodgkinsArthur Green, MD Brief Narrative: [name] is a 82 y.o. female with a medical history significant for, but not  limited to, atrial fibrillation, hypertension, chronic kidney disease stage IV, and diabetes. Patient unable to provide history secondary to encephalopathy. Patient lives with her son at home who is her primary caretaker. She is dependent on him for most of her ADLs. 9/19 morning son was unable to get mother out of bed. Patient was nearly unresponsive, breathing was labored previous day was sleepy all day. In the emergency room found to have a temp of 103.8, tachycardic, tachypneic, hypoxic and borderline hypotensive. Sepsis due to UTI+/- Pneumonia, Improved with broad spectrum Abx to treat UTI and pneumonia Now with volume overload, on diuretics improving  Assessment & Plan: 1. Severe sepsis -due to aspiration pneumonia vs UTI -clinically improvied on IV VAnc/Zosyn -sepsis physiology resolved -Blood and urine Cx negative -changed to Levaquin, completed 7days Abx will stop after todays dose  2. Volume overload/elevated troponin/demand ischemia -iatrogenic volume overload from Rx of sepsis/IVF etc -treating medically only, no plan for cardiac workup, ECHO ordered by Dr.Kim on 9/24 will FU -CXR with CHF and small effusions -s/p 2days of IV lasix, got iV this am, will change to PO lasix for another dose and then hold  3. Possible subacute CVA on CT head noted on admission -I discussed with patient and son, they do not want to pursue a stroke work and this will not significantly change management at age 82 -hence appropriately defered stroke workup at this time -continue ASA/statin, completed SLP/OT/PT eval, plan for SNF at discharge -On exam she has mild pronator drift of right upper extremity  3. AK I on CKD4 -Baseline creatinine of 1.9 this was 2.6 on admission, -Improved with IVF now  off, monitor with diuresis -bumped to 1.9 which is still her baseline, Bmet in am  4. Diabetes mellitus -Hold metformin, continue sliding scale insulin  5. History of paroxysmal atrial fibrillation -Not on anticoagulation due to advanced age, fall risk, her Chadvasc score is 5 -Continue aspirin  6. Chronic debility/ long-standing bilateral lower extremity weakness -Disabled and wheelchair bound at baseline for over 5 years  7. Hypothyroidism -TSH very high at 13, will double synthroid dose to 150mcg  DVT prophylaxis:      SQ Heparin  Code Status:      DNR Family Communication: Discussed with son at bedside  Disposition Plan:   SNF tomorrow if stable  Antimicrobials:Vanc/Zosyn 9/19->9/24 Levaquin 9/24-9/26  Subjective: Breathing starting to improved, coughed a lot last night  Objective: Vitals:   11/19/15 0704 11/19/15 1404 11/19/15 2115 11/20/15 0550  BP:  132/86 (!) 145/80 137/68  Pulse:  87 93 (!) 50  Resp:  18 16 16   Temp:  97.5 F (36.4 C) 97.5 F (36.4 C) 97.4 F (36.3 C)  TempSrc:  Oral Oral Oral  SpO2:   97% 96%  Weight: 82.4 kg (181 lb 10.5 oz)   78.9 kg (173 lb 15.1 oz)  Height:        Intake/Output Summary (Last 24 hours) at 11/20/15 1346 Last data filed at 11/20/15 1001  Gross per 24 hour  Intake              240 ml  Output             2750 ml  Net            -  2510 ml   Filed Weights   11/18/15 0542 11/19/15 0704 11/20/15 0550  Weight: 77.2 kg (170 lb 3.1 oz) 82.4 kg (181 lb 10.5 oz) 78.9 kg (173 lb 15.1 oz)    Examination:  General exam: Appears calm and comfortable, AAOx3 Respiratory system: improved crackles at bases Cardiovascular system: S1 & S2 heard, RRR. No JVD, murmurs, rubs, gallops or clicks.  Gastrointestinal system: Abdomen is nondistended, soft and nontender. Normal bowel sounds heard. Central nervous system: Alert and oriented. No focal neurological deficits. Extremities: trace edema, chronically weak lower legs Skin: No rashes,  lesions or ulcers Psychiatry: Judgement and insight appear normal. Mood & affect appropriate.     Data Reviewed: I have personally reviewed following labs and imaging studies  CBC:  Recent Labs Lab 11/14/15 0648 11/15/15 1636 11/18/15 0510 11/19/15 0654 11/20/15 0658  WBC 13.6* 9.5 11.5* 12.0* 10.2  NEUTROABS  --  6.6  --   --   --   HGB 8.7* 9.5* 10.4* 9.7* 10.3*  HCT 29.0* 30.5* 33.3* 31.3* 33.3*  MCV 101.0* 99.0 100.3* 99.4 98.5  PLT 221 200 234 253 272   Basic Metabolic Panel:  Recent Labs Lab 11/14/15 0648 11/15/15 1636 11/18/15 0510 11/19/15 0654 11/20/15 0658  NA 141 140 144 142 144  K 4.3 4.3 4.6 4.1 4.0  CL 118* 113* 116* 114* 113*  CO2 17* 18* 16* 20* 22  GLUCOSE 118* 133* 204* 191* 182*  BUN 56* 37* 27* 27* 31*  CREATININE 2.48* 2.10* 1.62* 1.78* 1.98*  CALCIUM 8.4* 9.3 10.3 10.5* 10.8*  MG  --  1.7  --   --   --    GFR: Estimated Creatinine Clearance: 17.6 mL/min (by C-G formula based on SCr of 1.98 mg/dL (H)). Liver Function Tests:  Recent Labs Lab 11/19/15 0654  AST 21  ALT 22  ALKPHOS 78  BILITOT 0.9  PROT 6.2*  ALBUMIN 2.7*   No results for input(s): LIPASE, AMYLASE in the last 168 hours. No results for input(s): AMMONIA in the last 168 hours. Coagulation Profile: No results for input(s): INR, PROTIME in the last 168 hours. Cardiac Enzymes:  Recent Labs Lab 11/18/15 1933 11/19/15 0105 11/19/15 0654  TROPONINI 0.48* 0.51* 0.49*   BNP (last 3 results) No results for input(s): PROBNP in the last 8760 hours. HbA1C: No results for input(s): HGBA1C in the last 72 hours. CBG:  Recent Labs Lab 11/19/15 1228 11/19/15 1703 11/19/15 2149 11/20/15 0810 11/20/15 1205  GLUCAP 195* 127* 174* 170* 202*   Lipid Profile: No results for input(s): CHOL, HDL, LDLCALC, TRIG, CHOLHDL, LDLDIRECT in the last 72 hours. Thyroid Function Tests:  Recent Labs  11/18/15 1933  TSH 13.132*   Anemia Panel: No results for input(s):  VITAMINB12, FOLATE, FERRITIN, TIBC, IRON, RETICCTPCT in the last 72 hours. Urine analysis:    Component Value Date/Time   COLORURINE YELLOW 11/13/2015 0927   APPEARANCEUR CLOUDY (A) 11/13/2015 0927   APPEARANCEUR Cloudy (A) 08/29/2014 1428   LABSPEC 1.025 11/13/2015 0927   PHURINE 5.0 11/13/2015 0927   GLUCOSEU NEGATIVE 11/13/2015 0927   HGBUR LARGE (A) 11/13/2015 0927   BILIRUBINUR NEGATIVE 11/13/2015 0927   BILIRUBINUR negative 12/13/2014 1217   BILIRUBINUR Negative 08/29/2014 1428   KETONESUR 15 (A) 11/13/2015 0927   PROTEINUR 100 (A) 11/13/2015 0927   UROBILINOGEN negative 12/13/2014 1217   UROBILINOGEN 0.2 07/12/2014 1251   NITRITE NEGATIVE 11/13/2015 0927   LEUKOCYTESUR LARGE (A) 11/13/2015 0927   LEUKOCYTESUR 3+ (A) 08/29/2014 1428  Sepsis Labs: @LABRCNTIP (procalcitonin:4,lacticidven:4)  ) Recent Results (from the past 240 hour(s))  Blood Culture (routine x 2)     Status: None   Collection Time: 11/13/15  9:10 AM  Result Value Ref Range Status   Specimen Description BLOOD RIGHT ANTECUBITAL  Final   Special Requests BOTTLES DRAWN AEROBIC AND ANAEROBIC 5CC  Final   Culture NO GROWTH 5 DAYS  Final   Report Status 11/18/2015 FINAL  Final  Blood Culture (routine x 2)     Status: None   Collection Time: 11/13/15  9:25 AM  Result Value Ref Range Status   Specimen Description BLOOD RIGHT HAND  Final   Special Requests BOTTLES DRAWN AEROBIC AND ANAEROBIC 5CC  Final   Culture NO GROWTH 5 DAYS  Final   Report Status 11/18/2015 FINAL  Final  Urine culture     Status: Abnormal   Collection Time: 11/13/15  9:27 AM  Result Value Ref Range Status   Specimen Description URINE, CATHETERIZED  Final   Special Requests NONE  Final   Culture MULTIPLE SPECIES PRESENT, SUGGEST RECOLLECTION (A)  Final   Report Status 11/14/2015 FINAL  Final  Culture, Urine     Status: None   Collection Time: 11/14/15 12:50 PM  Result Value Ref Range Status   Specimen Description URINE, CATHETERIZED   Final   Special Requests NONE  Final   Culture NO GROWTH  Final   Report Status 11/15/2015 FINAL  Final         Radiology Studies: Dg Chest 2 View  Result Date: 11/18/2015 CLINICAL DATA:  82 year old female with a history of cough EXAM: CHEST  2 VIEW COMPARISON:  11/13/2015, 10/07/2012 FINDINGS: Cardiomediastinal silhouette unchanged. Calcifications of the aortic arch. Surgical changes of prior median sternotomy and CABG. Prominence of central vasculature with interlobular septal thickening of the bilateral lungs. Low lung volumes with blunting of the left costophrenic angle. No pneumothorax. Thickening of the minor fissure. Blunting of the costophrenic sulcus on the lateral view No displaced fracture. Osteopenia. Surgical changes of prior vertebral augmentation. No new acute fracture identified. IMPRESSION: Evidence of congestive heart failure with small pleural effusions. Surgical changes of median sternotomy and CABG. Signed, Yvone Neu. Loreta Ave, DO [facility] Electronically Signed   By: Gilmer Mor D.O.   On: 11/18/2015 17:23        Scheduled Meds: . aspirin EC  81 mg Oral Daily  . atorvastatin  20 mg Oral q1800  . furosemide  20 mg Oral Daily  . guaiFENesin  600 mg Oral BID  . heparin  5,000 Units Subcutaneous Q8H  . insulin aspart  0-15 Units Subcutaneous TID WC  . levothyroxine  150 mcg Oral QAC breakfast  . pantoprazole  20 mg Oral Daily  . potassium chloride  40 mEq Oral Daily  . pregabalin  75 mg Oral BID   Continuous Infusions:     LOS: 7 days    Time spent:    Zannie Cove, MD [facility] Pager [phone]  If 7PM-7AM, please contact night-coverage [email or web address] Password [email or web address] 11/20/2015, 1:46 PM

## 2015-11-20 NOTE — Progress Notes (Signed)
Physical Therapy Treatment Patient Details Name: [name] MRN: [id number] DOB: 1916-03-03 Today's Date: 11/20/2015    History of Present Illness [name] a 82 y.o.femalewith a medical history significant for, but not limited to,atrial fibrillation, hypertension, chronic kidney disease stage IV, and diabetes. Patient with PNA and AMS with inabliltiy to get OOB     PT Comments    Patient is making progress toward mobility goals particularly with bed mobility and sitting balance. Pt does continue to require +2 for safe OOB transfers. Continue to progress as tolerated with anticipated d/c to SNF for further skilled PT services.    Follow Up Recommendations  SNF     Equipment Recommendations       Recommendations for Other Services       Precautions / Restrictions Precautions Precautions: Fall Restrictions Weight Bearing Restrictions: No    Mobility  Bed Mobility Overal bed mobility: Needs Assistance;+2 for physical assistance Bed Mobility: Supine to Sit     Supine to sit: Max assist;+2 for physical assistance;HOB elevated     General bed mobility comments: assist to bring bilat LE to EOB, elevate trunk into sitting, and bring hips toward EOB iwth use of bed pads; cues for sequencing and HOB elevated  Transfers Overall transfer level: Needs assistance Equipment used: 2 person hand held assist (face to face with gait belt and bed pad) Transfers: Sit to/from Stand;Stand Pivot Transfers Sit to Stand: Max assist;+2 physical assistance Stand pivot transfers: Max assist;+2 physical assistance       General transfer comment: assist to power up into standing, for balance in standing, and for safe descent to recliner;  multimodal cues for hand placement and posture  Ambulation/Gait                 Stairs            Wheelchair Mobility    Modified Rankin (Stroke Patients Only)       Balance     Sitting balance-Leahy Scale:  Poor Sitting balance - Comments: min A required for midline sitting; pt with posterior lean; mod cues for anterior weight shift                            Cognition Arousal/Alertness: Awake/alert Behavior During Therapy: WFL for tasks assessed/performed Overall Cognitive Status: No family/caregiver present to determine baseline cognitive functioning                      Exercises      General Comments        Pertinent Vitals/Pain Pain Assessment: No/denies pain    Home Living                      Prior Function            PT Goals (current goals can now be found in the care plan section) Acute Rehab PT Goals Patient Stated Goal: go home Progress towards PT goals: Progressing toward goals    Frequency    Min 3X/week      PT Plan Discharge plan needs to be updated    Co-evaluation             End of Session Equipment Utilized During Treatment: Gait belt Activity Tolerance: Patient tolerated treatment well Patient left: in chair;with call bell/phone within reach     Time: 1343-1403 PT Time Calculation (min) (ACUTE ONLY): 20 min  Charges:  $Therapeutic Activity: 8-22 mins                    G Codes:      Derek Mound, PTA Pager: [phone]   11/20/2015, 3:33 PM

## 2015-11-20 NOTE — Discharge Summary (Addendum)
Physician Discharge Summary  [name]:[id number] DOB: 07-Oct-1916 DOA: 11/13/2015  PCP: Murray Hodgkins, MD  Admit date: 11/13/2015 Discharge date: 11/21/2015  Time spent: 45 minutes  Recommendations for Outpatient Follow-up:  1. PCP Dr.Green in 1 week, needs Bmet checked prior to Follow up 2. Please monitor volume status and titrate diuretic dose as needed 3. Metformin stopped due to CKD, consider alternate Oral agent at Follow up, depending on CBGs 4. Recheck TSH in 2months   Discharge Diagnoses:  Principal Problem:   Sepsis (HCC)   Acute on Chronic systolic CHF   Hypothyroid   Hyperlipidemia   Paroxysmal atrial fibrillation (HCC)   DM (diabetes mellitus), type 2 with renal complications (HCC)   HTN (hypertension)   Dementia   UTI (lower urinary tract infection)   CKD stage 4 due to type 2 diabetes mellitus (HCC)   CAP (community acquired pneumonia)   Diabetes mellitus with complication (HCC)   Severe sepsis (HCC)   Aspiration pneumonia of right lower lobe due to vomit (HCC)   Acute renal failure superimposed on stage 4 chronic kidney disease (HCC)   Controlled diabetes mellitus type 2 with complications [facility])   Discharge Condition: improved  Diet recommendation: low sodium, diabetic  Filed Weights   11/18/15 0542 11/19/15 0704 11/20/15 0550  Weight: 77.2 kg (170 lb 3.1 oz) 82.4 kg (181 lb 10.5 oz) 78.9 kg (173 lb 15.1 oz)    History of present illness:  Tammy D Grayis a 82 y.o.femalewith a medical history significant for, but not limited to,atrial fibrillation, hypertension, chronic kidney disease stage IV, and diabetes. Patient unable to provide history secondary to encephalopathy. Patient lives with her son at home who is her primary caretaker. She is dependent on him for most of her ADLs. 9/19 morning son was unable to get mother out of bed. Patient was nearly unresponsive, breathing was labored previous day was sleepy all day. In the emergency room  found to have severe sepsis with a temp of 103.8, tachycardic, tachypneic, hypoxic and hypotensive.  Hospital Course:  1. Severe sepsis -due to aspiration pneumonia vs UTI -clinically improvied on IV VAnc/Zosyn -sepsis physiology resolved -Blood and urine Cx negative -improved, afebrile and leukocytosis resolved -Antibiotics were changed to oral Levaquin, subsequently completed 7days Abx stopped after 9/26 dose  2. Acute on chronic systolic CHF/Iatrogenic volume overload/elevated troponin/demand ischemia -iatrogenic volume overload from Rx of sepsis/IVF etc -treating medically only, no plan for cardiac workup, ECHO ordered by Dr.Kim on 9/24 showed EF of 35% down from previous baseline of 45% from 2014, I discussed with son that there will not be any plans for further cardiac workup -CXR 3days after admission was suggestive of CHF and small effusions -s/p 2days of IV lasix, and 9/26, changed to PO lasix at discharge. No ACE due to CKD  3. Possible subacute CVA on CT head noted on admission -I discussed with patient and son, they do not want to pursue a stroke work and this will not significantly change management at age 67 -hence appropriately defered stroke workup at this time -continue ASA/statin, completed SLP/OT/PT eval, plan for SNF at discharge -On exam she has mild pronator drift of right upper extremity  3. AK I on CKD4 -Baseline creatinine of 1.9 this was 2.6 on admission, -Improved with IVF now off -creatinine is 1.9 today which is still her baseline, ARB stopped and needs Bmet checked in a week upon FU  4. Diabetes mellitus -stopped metformin at discharge due to CKD3-4 -CBGs stable  was on SSI inpatient, consider alternate oral hypoglycemia at FU  5. History of paroxysmal atrial fibrillation -Not on anticoagulation due to advanced age, fall risk, her Chadvasc score is 5 -Continue aspirin  6. Chronic debility/ long-standing bilateral lower extremity  weakness -Disabled and wheelchair bound at baseline for over 5 years  7. Hypothyroidism -TSH very high at 13, hence doubled synthroid dose to 150mcg, repeat TSH in 2months  Addendum I evaluated Mrs [name] on 11/21/2015, she appears to be doing well. Her son is at bedside. She is sitting up in good spirits. She has no complaints. Echo showing EF on 35%, previously had EF of 45-50% in 2014. She is euvolemic, was given IV lasix during this hospitalization. She was discharged on her home regimen with Lasix 20 mg PO q daily. Labs showing Cr of 2.15. Recommend repeating BMP in 4-5 days. She was discharged to SNF on 11/21/2015 in stable condition.   Procedures: ECHO: Diffuse Hypokinesis, EF is 35% Discharge Exam: Vitals:   11/20/15 0550 11/20/15 2100  BP: 137/68 (!) 129/96  Pulse: (!) 50 69  Resp: 16 18  Temp: 97.4 F (36.3 C) 98.9 F (37.2 C)    General:AAOx2 Cardiovascular: S1S2/RRR, Respiratory: few basilar ronchi  Discharge Instructions    Current Discharge Medication List    CONTINUE these medications which have CHANGED   Details  furosemide (LASIX) 20 MG tablet Take 1 tablet (20 mg total) by mouth daily.    levothyroxine (SYNTHROID, LEVOTHROID) 75 MCG tablet Take 2 tablets (150 mcg total) by mouth daily before breakfast.    pregabalin (LYRICA) 75 MG capsule Take 1 capsule (75 mg total) by mouth 2 (two) times daily. Take one capsule by mouth twice daily as needed for pain and nerves   Associated Diagnoses: Hereditary and idiopathic peripheral neuropathy      CONTINUE these medications which have NOT CHANGED   Details  aspirin EC 81 MG tablet Take 1 tablet (81 mg total) by mouth daily. Qty: 30 tablet, Refills: 0    atorvastatin (LIPITOR) 20 MG tablet Take one tablet by mouth once daily to lower cholesterol Qty: 90 tablet, Refills: 1    calcitonin, salmon, (MIACALCIN/FORTICAL) 200 UNIT/ACT nasal spray INSTILL 1 SPRAY INTO ALTERNATING NOSTRILS ONCE A DAY FOR  OSTEOPROSIS Qty: 3.7 mL, Refills: 5    dextromethorphan-guaiFENesin (MUCINEX DM) 30-600 MG per 12 hr tablet Take 1 tablet by mouth every 12 (twelve) hours as needed for cough.     lansoprazole (PREVACID) 30 MG capsule Take one capsule by mouth once daily for stomach Qty: 30 capsule, Refills: 11    latanoprost (XALATAN) 0.005 % ophthalmic solution Place 1 drop into the left eye at bedtime.     Multiple Vitamins-Minerals (CENTRUM SILVER PO) Take 1 tablet by mouth daily.    Multiple Vitamins-Minerals (ICAPS AREDS 2) CAPS Take 1 capsule by mouth 2 (two) times daily.    oxyCODONE (ROXICODONE) 5 MG immediate release tablet One every 6 hours if needed for severe pain Qty: 50 tablet, Refills: 0   Associated Diagnoses: Contusion of foot, left, initial encounter    polyethylene glycol (MIRALAX / GLYCOLAX) packet Take 17 g by mouth daily. Qty: 14 each, Refills: 0    saccharomyces boulardii (FLORASTOR) 250 MG capsule Take 1 capsule (250 mg total) by mouth 2 (two) times daily. Qty: 20 capsule, Refills: 0    Trospium Chloride 60 MG CP24 Take one capsule by mouth once daily for bladder control Qty: 30 capsule, Refills: 5  STOP taking these medications     meloxicam (MOBIC) 7.5 MG tablet      metFORMIN (GLUCOPHAGE) 1000 MG tablet      olmesartan (BENICAR) 20 MG tablet      traMADol (ULTRAM) 50 MG tablet      diclofenac sodium (VOLTAREN) 1 % GEL      docusate sodium 100 MG CAPS      FLECTOR 1.3 % PTCH        Allergies  Allergen Reactions  . Cosopt [Dorzolamide Hcl-Timolol Mal] Other (See Comments)    Redness and burning in both eyes  . Noroxin [Norfloxacin] Swelling  . Sulfa Antibiotics Nausea And Vomiting  . Prozac [Fluoxetine Hcl] Rash   Follow-up Information    Murray Hodgkins, MD. Schedule an appointment as soon as possible for a visit in 1 week(s).   Specialty:  Internal Medicine Contact information: [address] [place] [address] [phone]             The results of significant diagnostics from this hospitalization (including imaging, microbiology, ancillary and laboratory) are listed below for reference.    Significant Diagnostic Studies: Dg Chest 2 View  Result Date: 11/18/2015 CLINICAL DATA:  82 year old female with a history of cough EXAM: CHEST  2 VIEW COMPARISON:  11/13/2015, 10/07/2012 FINDINGS: Cardiomediastinal silhouette unchanged. Calcifications of the aortic arch. Surgical changes of prior median sternotomy and CABG. Prominence of central vasculature with interlobular septal thickening of the bilateral lungs. Low lung volumes with blunting of the left costophrenic angle. No pneumothorax. Thickening of the minor fissure. Blunting of the costophrenic sulcus on the lateral view No displaced fracture. Osteopenia. Surgical changes of prior vertebral augmentation. No new acute fracture identified. IMPRESSION: Evidence of congestive heart failure with small pleural effusions. Surgical changes of median sternotomy and CABG. Signed, Yvone Neu. Loreta Ave, DO [facility] Electronically Signed   By: Gilmer Mor D.O.   On: 11/18/2015 17:23   Ct Head Wo Contrast  Result Date: 11/13/2015 CLINICAL DATA:  Altered mental status, unresponsive EXAM: CT HEAD WITHOUT CONTRAST TECHNIQUE: Contiguous axial images were obtained from the base of the skull through the vertex without intravenous contrast. COMPARISON:  CT brain of 10/08/2012 FINDINGS: Brain: The ventricular system remains dilated, and there are diffusely prominent cortical sulci, consistent with diffuse atrophy. Moderate to severe small vessel ischemic change is again noted throughout the periventricular white matter. There is low-attenuation rounded structure within the anterior limb of the internal capsule on the left which may represent acute or subacute lacunar infarct. No hemorrhage, mass lesion, or other area of infarction is seen.  Vascular: No vascular abnormality is seen on this unenhanced study. Skull: No calvarial abnormality is seen. Sinuses/Orbits: There is complete opacification with expansion of the right partition of the sphenoid sinus which appears relatively unchanged compared to the prior CT. This may be due to chronic sinusitis but clinical correlation is recommended. This opacification of the right sphenoid sinus measures 51 HU which could be due to proteinaceous debris although a soft tissue lesion cannot be excluded. If further assessment is warranted, MR would be recommended. Other: None IMPRESSION: 1. Stable moderate atrophy and moderate to severe small vessel ischemic change throughout the periventricular white matter. 2. Small low-attenuation structure in the anterior limb of the left internal capsule not definitely seen previously. Consider acute or subacute lacunar infarct. 3. Complete opacification of the right partition of the sphenoid sinus may be due to sinusitis possibly chronic but soft  tissue lesion cannot be excluded as noted above. Electronically Signed   By: Dwyane Dee M.D.   On: 11/13/2015 10:31   Dg Chest Port 1 View  Result Date: 11/13/2015 CLINICAL DATA:  Altered mental status.  Possible sepsis. EXAM: PORTABLE CHEST 1 VIEW COMPARISON:  10/07/2012 . FINDINGS: Prior CABG. Heart size stable. Bibasilar infiltrates left side greater than right noted these findings consist with pneumonia. Component basilar atelectasis noted. Small left pleural effusion. IMPRESSION: 1. Bibasilar pneumonia left side greater than right. Associated bibasilar subsegmental atelectasis. Small left pleural effusion. 2. Prior CABG.  Heart size stable.  No pulmonary venous congestion . Electronically Signed   By: Maisie Fus  Register   On: 11/13/2015 09:39    Microbiology: Recent Results (from the past 240 hour(s))  Blood Culture (routine x 2)     Status: None   Collection Time: 11/13/15  9:10 AM  Result Value Ref Range Status    Specimen Description BLOOD RIGHT ANTECUBITAL  Final   Special Requests BOTTLES DRAWN AEROBIC AND ANAEROBIC 5CC  Final   Culture NO GROWTH 5 DAYS  Final   Report Status 11/18/2015 FINAL  Final  Blood Culture (routine x 2)     Status: None   Collection Time: 11/13/15  9:25 AM  Result Value Ref Range Status   Specimen Description BLOOD RIGHT HAND  Final   Special Requests BOTTLES DRAWN AEROBIC AND ANAEROBIC 5CC  Final   Culture NO GROWTH 5 DAYS  Final   Report Status 11/18/2015 FINAL  Final  Urine culture     Status: Abnormal   Collection Time: 11/13/15  9:27 AM  Result Value Ref Range Status   Specimen Description URINE, CATHETERIZED  Final   Special Requests NONE  Final   Culture MULTIPLE SPECIES PRESENT, SUGGEST RECOLLECTION (A)  Final   Report Status 11/14/2015 FINAL  Final  Culture, Urine     Status: None   Collection Time: 11/14/15 12:50 PM  Result Value Ref Range Status   Specimen Description URINE, CATHETERIZED  Final   Special Requests NONE  Final   Culture NO GROWTH  Final   Report Status 11/15/2015 FINAL  Final     Labs: Basic Metabolic Panel:  Recent Labs Lab 11/14/15 0648 11/15/15 1636 11/18/15 0510 11/19/15 0654 11/20/15 0658  NA 141 140 144 142 144  K 4.3 4.3 4.6 4.1 4.0  CL 118* 113* 116* 114* 113*  CO2 17* 18* 16* 20* 22  GLUCOSE 118* 133* 204* 191* 182*  BUN 56* 37* 27* 27* 31*  CREATININE 2.48* 2.10* 1.62* 1.78* 1.98*  CALCIUM 8.4* 9.3 10.3 10.5* 10.8*  MG  --  1.7  --   --   --    Liver Function Tests:  Recent Labs Lab 11/19/15 0654  AST 21  ALT 22  ALKPHOS 78  BILITOT 0.9  PROT 6.2*  ALBUMIN 2.7*   No results for input(s): LIPASE, AMYLASE in the last 168 hours. No results for input(s): AMMONIA in the last 168 hours. CBC:  Recent Labs Lab 11/14/15 0648 11/15/15 1636 11/18/15 0510 11/19/15 0654 11/20/15 0658  WBC 13.6* 9.5 11.5* 12.0* 10.2  NEUTROABS  --  6.6  --   --   --   HGB 8.7* 9.5* 10.4* 9.7* 10.3*  HCT 29.0* 30.5* 33.3*  31.3* 33.3*  MCV 101.0* 99.0 100.3* 99.4 98.5  PLT 221 200 234 253 272   Cardiac Enzymes:  Recent Labs Lab 11/18/15 1933 11/19/15 0105 11/19/15 0654  TROPONINI 0.48* 0.51* 0.49*  BNP: BNP (last 3 results)  Recent Labs  11/18/15 1933  BNP 4,234.6*    ProBNP (last 3 results) No results for input(s): PROBNP in the last 8760 hours.  CBG:  Recent Labs Lab 11/19/15 2149 11/20/15 0810 11/20/15 1205 11/20/15 1700 11/20/15 2138  GLUCAP 174* 170* 202* 98 154*       Signed:  JOSEPH,PREETHA MD.  [facility] 11/20/2015, 9:40 PM

## 2015-11-21 LAB — CBC
HEMATOCRIT: 34 % — AB (ref 36.0–46.0)
HEMOGLOBIN: 10.6 g/dL — AB (ref 12.0–15.0)
MCH: 30.7 pg (ref 26.0–34.0)
MCHC: 31.2 g/dL (ref 30.0–36.0)
MCV: 98.6 fL (ref 78.0–100.0)
Platelets: 266 10*3/uL (ref 150–400)
RBC: 3.45 MIL/uL — AB (ref 3.87–5.11)
RDW: 14.9 % (ref 11.5–15.5)
WBC: 10.2 10*3/uL (ref 4.0–10.5)

## 2015-11-21 LAB — GLUCOSE, CAPILLARY
GLUCOSE-CAPILLARY: 224 mg/dL — AB (ref 65–99)
Glucose-Capillary: 183 mg/dL — ABNORMAL HIGH (ref 65–99)

## 2015-11-21 LAB — BASIC METABOLIC PANEL
ANION GAP: 14 (ref 5–15)
BUN: 34 mg/dL — ABNORMAL HIGH (ref 6–20)
CHLORIDE: 108 mmol/L (ref 101–111)
CO2: 21 mmol/L — AB (ref 22–32)
Calcium: 10.4 mg/dL — ABNORMAL HIGH (ref 8.9–10.3)
Creatinine, Ser: 2.15 mg/dL — ABNORMAL HIGH (ref 0.44–1.00)
GFR calc non Af Amer: 18 mL/min — ABNORMAL LOW (ref >60)
GFR, EST AFRICAN AMERICAN: 21 mL/min — AB (ref >60)
GLUCOSE: 173 mg/dL — AB (ref 65–99)
POTASSIUM: 4.1 mmol/L (ref 3.5–5.1)
Sodium: 143 mmol/L (ref 135–145)

## 2015-11-21 MED ORDER — OXYCODONE HCL 5 MG PO TABS: ORAL_TABLET | ORAL | 0 refills | Status: DC

## 2015-11-21 NOTE — Clinical Social Work Placement (Signed)
   CLINICAL SOCIAL WORK PLACEMENT  NOTE  Date:  11/21/2015  Patient Details  Name: [name] MRN: [id number] Date of Birth: 07/18/1916  Clinical Social Work is seeking post-discharge placement for this patient at the Skilled  Nursing Facility level of care (*CSW will initial, date and re-position this form in  chart as items are completed):  Yes   Patient/family provided with [facility] Clinical Social Work Department's list of facilities offering this level of care within the geographic area requested by the patient (or if unable, by the patient's family).  Yes   Patient/family informed of their freedom to choose among providers that offer the needed level of care, that participate in Medicare, Medicaid or managed care program needed by the patient, have an available bed and are willing to accept the patient.  Yes   Patient/family informed of [facility]'s ownership interest in [facility] and [facility], as well as of the fact that they are under no obligation to receive care at these facilities.  PASRR submitted to EDS on 11/16/15     PASRR number received on       Existing PASRR number confirmed on 11/16/15     FL2 transmitted to all facilities in geographic area requested by pt/family on 11/16/15     FL2 transmitted to all facilities within larger geographic area on       Patient informed that his/her managed care company has contracts with or will negotiate with certain facilities, including the following:        Yes   Patient/family informed of bed offers received.  [facility]     Physician recommends and patient chooses bed at      [facility]  Patient to be transferred to facility by [facility]     Patient family notified on 11/21/15 of transfer.  Name of family member notified:  [name], son     PHYSICIAN Please sign DNR, Please sign FL2     Additional Comment:     _______________________________________________ Mearl LatinNadia S Kacin Dancy, LCSWA 11/21/2015, 11:04 AM

## 2015-11-21 NOTE — Progress Notes (Signed)
Patient will DC to: [facility] Anticipated DC date: 11/21/15 Family notified: Son Transport by: Day[facility] BarkerPTAR 2pm   Per MD [facility]. RN, patient, patient's family, and facility notified of DC. Discharge Summary sent to facility. RN given number for report. DC packet on chart. Ambulance transport requested for patient.   CSW signing off.  Cristobal GoldmannNadia Matraca Hunkins, [place] Clinical Social Worker [phone]

## 2015-11-21 NOTE — Progress Notes (Signed)
[facility] called to [facility]

## 2015-11-22 ENCOUNTER — Encounter: Payer: Self-pay | Admitting: Internal Medicine

## 2015-11-22 ENCOUNTER — Non-Acute Institutional Stay (SKILLED_NURSING_FACILITY): Payer: Medicare Other | Admitting: Internal Medicine

## 2015-11-22 DIAGNOSIS — K5901 Slow transit constipation: Secondary | ICD-10-CM | POA: Diagnosis not present

## 2015-11-22 DIAGNOSIS — E785 Hyperlipidemia, unspecified: Secondary | ICD-10-CM

## 2015-11-22 DIAGNOSIS — Z8673 Personal history of transient ischemic attack (TIA), and cerebral infarction without residual deficits: Secondary | ICD-10-CM

## 2015-11-22 DIAGNOSIS — M792 Neuralgia and neuritis, unspecified: Secondary | ICD-10-CM | POA: Diagnosis not present

## 2015-11-22 DIAGNOSIS — K2970 Gastritis, unspecified, without bleeding: Secondary | ICD-10-CM

## 2015-11-22 DIAGNOSIS — K2990 Gastroduodenitis, unspecified, without bleeding: Secondary | ICD-10-CM

## 2015-11-22 DIAGNOSIS — R5381 Other malaise: Secondary | ICD-10-CM | POA: Diagnosis not present

## 2015-11-22 DIAGNOSIS — J690 Pneumonitis due to inhalation of food and vomit: Secondary | ICD-10-CM

## 2015-11-22 DIAGNOSIS — R062 Wheezing: Secondary | ICD-10-CM

## 2015-11-22 DIAGNOSIS — E038 Other specified hypothyroidism: Secondary | ICD-10-CM

## 2015-11-22 DIAGNOSIS — N184 Chronic kidney disease, stage 4 (severe): Secondary | ICD-10-CM

## 2015-11-22 DIAGNOSIS — N3281 Overactive bladder: Secondary | ICD-10-CM

## 2015-11-22 DIAGNOSIS — N3000 Acute cystitis without hematuria: Secondary | ICD-10-CM | POA: Diagnosis not present

## 2015-11-22 DIAGNOSIS — I5023 Acute on chronic systolic (congestive) heart failure: Secondary | ICD-10-CM | POA: Diagnosis not present

## 2015-11-22 DIAGNOSIS — K219 Gastro-esophageal reflux disease without esophagitis: Secondary | ICD-10-CM

## 2015-11-22 DIAGNOSIS — D638 Anemia in other chronic diseases classified elsewhere: Secondary | ICD-10-CM

## 2015-11-22 DIAGNOSIS — E46 Unspecified protein-calorie malnutrition: Secondary | ICD-10-CM

## 2015-11-22 NOTE — Progress Notes (Signed)
LOCATION:  [facility]  PCP: Murray Hodgkins, MD   Code Status: DNR  Goals of care: Advanced Directive information Advanced Directives 11/22/2015  Does patient have an advance directive? Yes  Type of Advance Directive Out of facility DNR (pink MOST or yellow form)  Does patient want to make changes to advanced directive? No - Patient declined  Copy of advanced directive(s) in chart? Yes  Pre-existing out of facility DNR order (yellow form or pink MOST form) -       Extended Emergency Contact Information Primary Emergency Contact: Tonita Phoenix Address: [address] [place], [place] [address] [place] of [place] Home Phone: [phone] Mobile Phone: [phone] Relation: Son   Allergies  Allergen Reactions  . Cosopt [Dorzolamide Hcl-Timolol Mal] Other (See Comments)    Redness and burning in both eyes  . Noroxin [Norfloxacin] Swelling  . Sulfa Antibiotics Nausea And Vomiting  . Prozac [Fluoxetine Hcl] Rash    Chief Complaint  Patient presents with  . New Admit To SNF    New Admission Visit     HPI:  Patient is a 82 y.o. female seen today for short term rehabilitation post hospital admission from 11/13/15-11/21/15 with aspiration pneumonia and UTI with sepsis. She was started on iv antibiotics and has now completed it. She also had acute on chronic systolic chf exacerbation and demand ischemia. She required iv diuresis. There was concern for subacute CVA but no workup was done after discussion with her son. She is on aspirin and statin. She also had AKI on ckd stage 4 and received iv fluids. She has hx of DM, afib, hypothyroidism. She is seen in her room today with her son at bedside.   Review of Systems:  Constitutional: Negative for fever, chills, diaphoresis. Feels weak and tired. HENT: Negative for headache, congestion, nasal discharge Eyes: Negative for blurred vision, double vision and discharge. Wears glasses. Respiratory: Negative for  wheezing. Positive for chronic dry cough and some shortness of breath.   Cardiovascular: Negative for chest pain, palpitations. Has chronic leg swelling.  Gastrointestinal: Negative for heartburn, nausea, vomiting, abdominal pain. Last bowel movement was Sunday.  Genitourinary: Negative for dysuria and flank pain.  Musculoskeletal: Negative for back pain, fall in the facility.  Skin: Negative for itching, rash.  Neurological: Negative for dizziness. Psychiatric/Behavioral: Negative for depression   Past Medical History:  Diagnosis Date  . Allergic rhinitis, cause unspecified   . Anemia   . Arthralgia of temporomandibular joint   . Arthropathy, unspecified, site unspecified   . Atrial fibrillation (HCC)   . Bronchitis, acute   . Carpal tunnel syndrome   . Chest pain, unspecified   . Chronic kidney disease, unspecified (HCC)   . Closed dislocation, thoracic vertebra   . Closed fracture of lumbar vertebra without mention of spinal cord injury   . Diabetes type 2, uncontrolled (HCC)   . Diabetes with renal manifestations(250.4)   . Diastasis of muscle   . Eczema   . Gait disorder   . Hematuria, unspecified   . Hemoptysis   . History of nuclear stress test 06/06/2010   lexiscan; normal pattern of perfusion; low risk   . Hyperlipidemia   . Hyperpotassemia   . Hypertension   . Hypothyroid   . Insomnia, unspecified   . Macular degeneration (senile) of retina, unspecified   . Mild memory disturbance   . Myoclonus   . Myoclonus   . Obesity, unspecified   .  Open wound of knee, leg (except thigh), and ankle, without mention of complication   . Osteoporosis   . Other atopic dermatitis and related conditions   . Other disorder of calcium metabolism   . Other disorder of calcium metabolism   . Other specified idiopathic peripheral neuropathy   . Personal history of fall   . Personality change due to conditions classified elsewhere   . Scoliosis   . Seborrheic keratosis   . Trigger  finger (acquired)   . Type II or unspecified type diabetes mellitus without mention of complication, not stated as uncontrolled   . Unspecified closed fracture of pelvis   . Unspecified constipation   . Unspecified glaucoma   . Unspecified hereditary and idiopathic peripheral neuropathy   . Unspecified hypertrophic and atrophic condition of skin   . Unspecified pruritic disorder   . Unspecified urinary incontinence   . Unspecified vitamin D deficiency   . Urinary tract infection, site not specified   . Varicose veins of lower extremities with inflammation    Past Surgical History:  Procedure Laterality Date  . ABDOMINAL HYSTERECTOMY     1973  . BIOPSY BREAST     2000  . CORONARY ARTERY BYPASS GRAFT     Dr Tyrone Sage 04/1996  . ESOPHAGOGASTRODUODENOSCOPY N/A 08/17/2012   Procedure: ESOPHAGOGASTRODUODENOSCOPY (EGD);  Surgeon: Hilarie Fredrickson, MD;  Location: [facility];  Service: Endoscopy;  Laterality: N/A;  . HOT HEMOSTASIS N/A 08/17/2012   Procedure: HOT HEMOSTASIS (ARGON PLASMA COAGULATION/BICAP);  Surgeon: Hilarie Fredrickson, MD;  Location: [facility];  Service: Endoscopy;  Laterality: N/A;  . KNEE ARTHROSCOPY     left Dr Thurston Hole   . LASER LAPAROSCOPY     right eye 04/2005  . LUMBAR SPINE SURGERY     Dr Newell Coral   . RIGHT OOPHORECTOMY     1950   Social History:   reports that she has never smoked. She has never used smokeless tobacco. She reports that she drinks alcohol. She reports that she does not use drugs.  Family History  Problem Relation Age of Onset  . Diabetes Mother   . Cancer Sister     colon  . Alzheimer's disease Sister   . Prostate cancer Son     Medications:   Medication List       Accurate as of 11/22/15 12:03 PM. Always use your most recent med list.          aspirin EC 81 MG tablet Take 1 tablet (81 mg total) by mouth daily.   atorvastatin 20 MG tablet Commonly known as:  LIPITOR Take one tablet by mouth once daily to lower cholesterol     calcitonin (salmon) 200 UNIT/ACT nasal spray Commonly known as:  MIACALCIN/FORTICAL INSTILL 1 SPRAY INTO ALTERNATING NOSTRILS ONCE A DAY FOR OSTEOPROSIS   ICAPS AREDS 2 Caps Take 1 capsule by mouth 2 (two) times daily.   CENTRUM SILVER PO Take 1 tablet by mouth daily.   dextromethorphan-guaiFENesin 30-600 MG 12hr tablet Commonly known as:  MUCINEX DM Take 1 tablet by mouth every 12 (twelve) hours as needed for cough.   furosemide 20 MG tablet Commonly known as:  LASIX Take 1 tablet (20 mg total) by mouth daily.   lansoprazole 30 MG capsule Commonly known as:  PREVACID Take one capsule by mouth once daily for stomach   latanoprost 0.005 % ophthalmic solution Commonly known as:  XALATAN Place 1 drop into the left eye at bedtime.   levothyroxine 75 MCG tablet  Commonly known as:  SYNTHROID, LEVOTHROID Take 2 tablets (150 mcg total) by mouth daily before breakfast.   oxyCODONE 5 MG immediate release tablet Commonly known as:  ROXICODONE One every 6 hours if needed for severe pain   polyethylene glycol packet Commonly known as:  MIRALAX / GLYCOLAX Take 17 g by mouth daily.   pregabalin 75 MG capsule Commonly known as:  LYRICA Take 1 capsule (75 mg total) by mouth 2 (two) times daily. Take one capsule by mouth twice daily as needed for pain and nerves   saccharomyces boulardii 250 MG capsule Commonly known as:  FLORASTOR Take 1 capsule (250 mg total) by mouth 2 (two) times daily.   Trospium Chloride 60 MG Cp24 Take one capsule by mouth once daily for bladder control       Immunizations: Immunization History  Administered Date(s) Administered  . Influenza Whole 11/26/2010, 11/27/2011  . Influenza, High Dose Seasonal PF 11/30/2013  . Influenza,inj,Quad PF,36+ Mos 10/31/2015  . Influenza-Unspecified 11/19/2012, 11/30/2013, 11/27/2014, 11/27/2014  . PPD Test 11/21/2015  . Pneumococcal Conjugate-13 03/04/2011  . Pneumococcal Polysaccharide-23 10/31/2015  . Tdap  03/04/2011  . Zoster 07/14/2005     Physical Exam:  Vitals:   11/22/15 1156  BP: 113/63  Pulse: 80  Resp: 20  Temp: 98.7 F (37.1 C)  TempSrc: Oral  SpO2: 98%  Weight: 171 lb 14.4 oz (78 kg)  Height: 5\' 7"  (1.702 m)   Body mass index is 26.92 kg/m.  General- elderly female, well built, in no acute distress Head- normocephalic, atraumatic Nose- no maxillary or frontal sinus tenderness, no nasal discharge Throat- moist mucus membrane  Eyes- PERRLA, EOMI, no pallor, no icterus, no discharge, normal conjunctiva, normal sclera Neck- no cervical lymphadenopathy Cardiovascular- normal s1,s2, no murmur, trace left leg and 1+ right leg edema Respiratory- poor air movement, + wheeze, no rhonchi, no crackles, no use of accessory muscles, shortness of breath with minimal exertion noted Abdomen- bowel sounds present, soft, non tender Musculoskeletal- able to move all 4 extremities, generalized weakness, right foot drop Neurological- alert and oriented to person and place but not to time Skin- warm and dry Psychiatry- normal mood and affect    Labs reviewed: Basic Metabolic Panel:  Recent Labs  16/11/9607/21/17 1636  11/19/15 0654 11/20/15 0658 11/21/15 0547  NA 140  < > 142 144 143  K 4.3  < > 4.1 4.0 4.1  CL 113*  < > 114* 113* 108  CO2 18*  < > 20* 22 21*  GLUCOSE 133*  < > 191* 182* 173*  BUN 37*  < > 27* 31* 34*  CREATININE 2.10*  < > 1.78* 1.98* 2.15*  CALCIUM 9.3  < > 10.5* 10.8* 10.4*  MG 1.7  --   --   --   --   < > = values in this interval not displayed. Liver Function Tests:  Recent Labs  09/07/15 1520 11/13/15 0910 11/19/15 0654  AST 15 20 21   ALT 9 13* 22  ALKPHOS 88 96 78  BILITOT 0.3 0.7 0.9  PROT 6.8 6.3* 6.2*  ALBUMIN 3.7 3.2* 2.7*   No results for input(s): LIPASE, AMYLASE in the last 8760 hours. No results for input(s): AMMONIA in the last 8760 hours. CBC:  Recent Labs  09/07/15 1520 11/13/15 0910  11/15/15 1636  11/19/15 0654  11/20/15 0658 11/21/15 0547  WBC 7.7 18.6*  < > 9.5  < > 12.0* 10.2 10.2  NEUTROABS 3,773 16.9*  --  6.6  --   --   --   --  HGB 11.2* 11.3*  < > 9.5*  < > 9.7* 10.3* 10.6*  HCT 34.3* 35.5*  < > 30.5*  < > 31.3* 33.3* 34.0*  MCV 95.5 97.8  < > 99.0  < > 99.4 98.5 98.6  PLT 258 274  < > 200  < > 253 272 266  < > = values in this interval not displayed. Cardiac Enzymes:  Recent Labs  11/18/15 1933 11/19/15 0105 11/19/15 0654  TROPONINI 0.48* 0.51* 0.49*   BNP: Invalid input(s): POCBNP CBG:  Recent Labs  11/20/15 2138 11/21/15 0753 11/21/15 1202  GLUCAP 154* 183* 224*    Radiological Exams: Dg Chest 2 View  Result Date: 11/18/2015 CLINICAL DATA:  82 year old female with a history of cough EXAM: CHEST  2 VIEW COMPARISON:  11/13/2015, 10/07/2012 FINDINGS: Cardiomediastinal silhouette unchanged. Calcifications of the aortic arch. Surgical changes of prior median sternotomy and CABG. Prominence of central vasculature with interlobular septal thickening of the bilateral lungs. Low lung volumes with blunting of the left costophrenic angle. No pneumothorax. Thickening of the minor fissure. Blunting of the costophrenic sulcus on the lateral view No displaced fracture. Osteopenia. Surgical changes of prior vertebral augmentation. No new acute fracture identified. IMPRESSION: Evidence of congestive heart failure with small pleural effusions. Surgical changes of median sternotomy and CABG. Signed, Yvone Neu. Loreta Ave, DO [facility] Electronically Signed   By: Gilmer Mor D.O.   On: 11/18/2015 17:23   Ct Head Wo Contrast  Result Date: 11/13/2015 CLINICAL DATA:  Altered mental status, unresponsive EXAM: CT HEAD WITHOUT CONTRAST TECHNIQUE: Contiguous axial images were obtained from the base of the skull through the vertex without intravenous contrast. COMPARISON:  CT brain of 10/08/2012 FINDINGS: Brain: The ventricular system remains  dilated, and there are diffusely prominent cortical sulci, consistent with diffuse atrophy. Moderate to severe small vessel ischemic change is again noted throughout the periventricular white matter. There is low-attenuation rounded structure within the anterior limb of the internal capsule on the left which may represent acute or subacute lacunar infarct. No hemorrhage, mass lesion, or other area of infarction is seen. Vascular: No vascular abnormality is seen on this unenhanced study. Skull: No calvarial abnormality is seen. Sinuses/Orbits: There is complete opacification with expansion of the right partition of the sphenoid sinus which appears relatively unchanged compared to the prior CT. This may be due to chronic sinusitis but clinical correlation is recommended. This opacification of the right sphenoid sinus measures 51 HU which could be due to proteinaceous debris although a soft tissue lesion cannot be excluded. If further assessment is warranted, MR would be recommended. Other: None IMPRESSION: 1. Stable moderate atrophy and moderate to severe small vessel ischemic change throughout the periventricular white matter. 2. Small low-attenuation structure in the anterior limb of the left internal capsule not definitely seen previously. Consider acute or subacute lacunar infarct. 3. Complete opacification of the right partition of the sphenoid sinus may be due to sinusitis possibly chronic but soft tissue lesion cannot be excluded as noted above. Electronically Signed   By: Dwyane Dee M.D.   On: 11/13/2015 10:31   Dg Chest Port 1 View  Result Date: 11/13/2015 CLINICAL DATA:  Altered mental status.  Possible sepsis. EXAM: PORTABLE CHEST 1 VIEW COMPARISON:  10/07/2012 . FINDINGS: Prior CABG. Heart size stable. Bibasilar infiltrates left side greater than right noted these findings consist with pneumonia. Component basilar atelectasis noted. Small left pleural effusion. IMPRESSION: 1. Bibasilar pneumonia left  side greater than  right. Associated bibasilar subsegmental atelectasis. Small left pleural effusion. 2. Prior CABG.  Heart size stable.  No pulmonary venous congestion . Electronically Signed   By: Maisie Fus  Register   On: 11/13/2015 09:39    Assessment/Plan  Physical deconditioning Will have her work with physical therapy and occupational therapy team to help with gait training and muscle strengthening exercises.fall precautions. Skin care. Encourage to be out of bed.   Acute cystitis Completed antibiotics, hydration and perineal hygiene to be maintained  Aspiration pneumonia SLP to evaluate, aspiration precautions. Complete her antibiotics  Acute on chronic systolic chf Required iv diuresis in hospital. Continue lasix 20 mg daily for now. monitor daily weight x 1 week and then 3 days a week. Check bmp. Monitor o2 sat and provide o2 by nasal canula if needed. Add duoneb tid x 5 days for wheezing and then prn. EF 35% on echocardiogram  Wheezing Add duoneb q8 tid x 5 days and then q8h prn. Monitor clinically for signs of fluid overload. Continue mucinex to help with her cough  Protein calorie malnutrition Get RD to evaluate. Monitor po intake and weight. Continue multivitamin  ckd stage 4 Monitor bmp  Constipation Currently on miralax daily. Add senna s 2 tab qhs and change miralax to bid for now x 2 days and then daily and monitor  Anemia of chronic disease Monitor cbc  Hyperlipidemia Continue her atorvastatin  History of cva Continue aspirin and statin  Neuropathic pain Continue her lyrica 75 mg bid and monitor  Gastritis and duodenitis Stable, continue her prevacid  OAB Continue trospium and monitor  Hypothyroidism Continue her levothyroxine    Goals of care: short term rehabilitation   Labs/tests ordered: cbc, cmp 11/26/15  Family/ staff Communication: reviewed care plan with patient, her son and nursing supervisor    Oneal Grout, MD Internal  Medicine [facility] [address] [place], [place] [address] Cell Phone (Monday-Friday 8 am - 5 pm): [phone] On Call: [phone] and follow prompts after 5 pm and on weekends Office Phone: [phone] Office Fax: [phone]

## 2015-11-26 ENCOUNTER — Encounter (HOSPITAL_COMMUNITY): Payer: Self-pay | Admitting: *Deleted

## 2015-11-26 ENCOUNTER — Inpatient Hospital Stay (HOSPITAL_COMMUNITY)
Admission: EM | Admit: 2015-11-26 | Discharge: 2015-12-03 | DRG: 871 | Disposition: A | Discharge status: Skilled Nursing Facility | Payer: Medicare Other | Attending: Internal Medicine | Admitting: Internal Medicine

## 2015-11-26 ENCOUNTER — Emergency Department (HOSPITAL_COMMUNITY): Payer: Medicare Other

## 2015-11-26 DIAGNOSIS — Z882 Allergy status to sulfonamides status: Secondary | ICD-10-CM | POA: Diagnosis not present

## 2015-11-26 DIAGNOSIS — M129 Arthropathy, unspecified: Secondary | ICD-10-CM | POA: Diagnosis present

## 2015-11-26 DIAGNOSIS — M419 Scoliosis, unspecified: Secondary | ICD-10-CM | POA: Diagnosis present

## 2015-11-26 DIAGNOSIS — Z888 Allergy status to other drugs, medicaments and biological substances status: Secondary | ICD-10-CM | POA: Diagnosis not present

## 2015-11-26 DIAGNOSIS — H3530 Unspecified macular degeneration: Secondary | ICD-10-CM | POA: Diagnosis present

## 2015-11-26 DIAGNOSIS — J9601 Acute respiratory failure with hypoxia: Secondary | ICD-10-CM | POA: Diagnosis present

## 2015-11-26 DIAGNOSIS — Z951 Presence of aortocoronary bypass graft: Secondary | ICD-10-CM

## 2015-11-26 DIAGNOSIS — I5042 Chronic combined systolic (congestive) and diastolic (congestive) heart failure: Secondary | ICD-10-CM | POA: Diagnosis not present

## 2015-11-26 DIAGNOSIS — M810 Age-related osteoporosis without current pathological fracture: Secondary | ICD-10-CM | POA: Diagnosis present

## 2015-11-26 DIAGNOSIS — I130 Hypertensive heart and chronic kidney disease with heart failure and stage 1 through stage 4 chronic kidney disease, or unspecified chronic kidney disease: Secondary | ICD-10-CM | POA: Diagnosis present

## 2015-11-26 DIAGNOSIS — Z7982 Long term (current) use of aspirin: Secondary | ICD-10-CM | POA: Diagnosis not present

## 2015-11-26 DIAGNOSIS — N390 Urinary tract infection, site not specified: Secondary | ICD-10-CM

## 2015-11-26 DIAGNOSIS — H409 Unspecified glaucoma: Secondary | ICD-10-CM | POA: Diagnosis present

## 2015-11-26 DIAGNOSIS — I5043 Acute on chronic combined systolic (congestive) and diastolic (congestive) heart failure: Secondary | ICD-10-CM | POA: Diagnosis present

## 2015-11-26 DIAGNOSIS — I4891 Unspecified atrial fibrillation: Secondary | ICD-10-CM | POA: Diagnosis not present

## 2015-11-26 DIAGNOSIS — Z90710 Acquired absence of both cervix and uterus: Secondary | ICD-10-CM | POA: Diagnosis not present

## 2015-11-26 DIAGNOSIS — I480 Paroxysmal atrial fibrillation: Secondary | ICD-10-CM | POA: Diagnosis present

## 2015-11-26 DIAGNOSIS — A419 Sepsis, unspecified organism: Principal | ICD-10-CM | POA: Diagnosis present

## 2015-11-26 DIAGNOSIS — Z881 Allergy status to other antibiotic agents status: Secondary | ICD-10-CM

## 2015-11-26 DIAGNOSIS — E785 Hyperlipidemia, unspecified: Secondary | ICD-10-CM | POA: Diagnosis present

## 2015-11-26 DIAGNOSIS — G9341 Metabolic encephalopathy: Secondary | ICD-10-CM | POA: Diagnosis present

## 2015-11-26 DIAGNOSIS — E1122 Type 2 diabetes mellitus with diabetic chronic kidney disease: Secondary | ICD-10-CM | POA: Diagnosis present

## 2015-11-26 DIAGNOSIS — Z79899 Other long term (current) drug therapy: Secondary | ICD-10-CM

## 2015-11-26 DIAGNOSIS — J9801 Acute bronchospasm: Secondary | ICD-10-CM | POA: Diagnosis present

## 2015-11-26 DIAGNOSIS — R0602 Shortness of breath: Secondary | ICD-10-CM | POA: Diagnosis not present

## 2015-11-26 DIAGNOSIS — E039 Hypothyroidism, unspecified: Secondary | ICD-10-CM | POA: Diagnosis present

## 2015-11-26 DIAGNOSIS — D631 Anemia in chronic kidney disease: Secondary | ICD-10-CM | POA: Diagnosis present

## 2015-11-26 DIAGNOSIS — G609 Hereditary and idiopathic neuropathy, unspecified: Secondary | ICD-10-CM | POA: Diagnosis present

## 2015-11-26 DIAGNOSIS — Z66 Do not resuscitate: Secondary | ICD-10-CM | POA: Diagnosis present

## 2015-11-26 DIAGNOSIS — R4182 Altered mental status, unspecified: Secondary | ICD-10-CM | POA: Diagnosis present

## 2015-11-26 DIAGNOSIS — N184 Chronic kidney disease, stage 4 (severe): Secondary | ICD-10-CM | POA: Diagnosis present

## 2015-11-26 DIAGNOSIS — I5033 Acute on chronic diastolic (congestive) heart failure: Secondary | ICD-10-CM | POA: Diagnosis not present

## 2015-11-26 DIAGNOSIS — I2510 Atherosclerotic heart disease of native coronary artery without angina pectoris: Secondary | ICD-10-CM | POA: Diagnosis present

## 2015-11-26 LAB — CBC WITH DIFFERENTIAL/PLATELET
Basophils Absolute: 0.1 10*3/uL (ref 0.0–0.1)
Basophils Relative: 0 %
Eosinophils Absolute: 0.1 10*3/uL (ref 0.0–0.7)
Eosinophils Relative: 1 %
HCT: 31.4 % — ABNORMAL LOW (ref 36.0–46.0)
Hemoglobin: 10.1 g/dL — ABNORMAL LOW (ref 12.0–15.0)
Lymphocytes Relative: 14 %
Lymphs Abs: 2.2 10*3/uL (ref 0.7–4.0)
MCH: 30.9 pg (ref 26.0–34.0)
MCHC: 32.2 g/dL (ref 30.0–36.0)
MCV: 96 fL (ref 78.0–100.0)
Monocytes Absolute: 1.2 10*3/uL — ABNORMAL HIGH (ref 0.1–1.0)
Monocytes Relative: 8 %
Neutro Abs: 12.7 10*3/uL — ABNORMAL HIGH (ref 1.7–7.7)
Neutrophils Relative %: 77 %
Platelets: 352 10*3/uL (ref 150–400)
RBC: 3.27 MIL/uL — ABNORMAL LOW (ref 3.87–5.11)
RDW: 15 % (ref 11.5–15.5)
WBC: 16.4 10*3/uL — ABNORMAL HIGH (ref 4.0–10.5)

## 2015-11-26 LAB — COMPREHENSIVE METABOLIC PANEL
ALT: 13 U/L — ABNORMAL LOW (ref 14–54)
AST: 17 U/L (ref 15–41)
Albumin: 3.1 g/dL — ABNORMAL LOW (ref 3.5–5.0)
Alkaline Phosphatase: 79 U/L (ref 38–126)
Anion gap: 9 (ref 5–15)
BUN: 40 mg/dL — ABNORMAL HIGH (ref 6–20)
CO2: 26 mmol/L (ref 22–32)
Calcium: 10.1 mg/dL (ref 8.9–10.3)
Chloride: 103 mmol/L (ref 101–111)
Creatinine, Ser: 2.6 mg/dL — ABNORMAL HIGH (ref 0.44–1.00)
GFR calc Af Amer: 17 mL/min — ABNORMAL LOW (ref >60)
GFR calc non Af Amer: 14 mL/min — ABNORMAL LOW (ref >60)
Glucose, Bld: 229 mg/dL — ABNORMAL HIGH (ref 65–99)
Potassium: 4.3 mmol/L (ref 3.5–5.1)
Sodium: 138 mmol/L (ref 135–145)
Total Bilirubin: 0.7 mg/dL (ref 0.3–1.2)
Total Protein: 6.7 g/dL (ref 6.5–8.1)

## 2015-11-26 LAB — BASIC METABOLIC PANEL
BUN: 33 mg/dL — AB (ref 4–21)
CREATININE: 2 mg/dL — AB (ref 0.5–1.1)
Glucose: 229 mg/dL
Potassium: 5 mmol/L (ref 3.4–5.3)
Sodium: 144 mmol/L (ref 137–147)

## 2015-11-26 LAB — URINE MICROSCOPIC-ADD ON: RBC / HPF: NONE SEEN RBC/hpf (ref 0–5)

## 2015-11-26 LAB — URINALYSIS, ROUTINE W REFLEX MICROSCOPIC
Bilirubin Urine: NEGATIVE
Glucose, UA: NEGATIVE mg/dL
Hgb urine dipstick: NEGATIVE
Ketones, ur: NEGATIVE mg/dL
Nitrite: NEGATIVE
Protein, ur: NEGATIVE mg/dL
Specific Gravity, Urine: 1.015 (ref 1.005–1.030)
pH: 5 (ref 5.0–8.0)

## 2015-11-26 LAB — HEPATIC FUNCTION PANEL
ALK PHOS: 96 U/L (ref 25–125)
ALT: 11 U/L (ref 7–35)
AST: 13 U/L (ref 13–35)
BILIRUBIN, TOTAL: 0.4 mg/dL

## 2015-11-26 LAB — CBC AND DIFFERENTIAL
HEMATOCRIT: 35 % — AB (ref 36–46)
HEMOGLOBIN: 11.1 g/dL — AB (ref 12.0–16.0)
NEUTROS ABS: 17 /uL
PLATELETS: 357 10*3/uL (ref 150–399)
WBC: 20.1 10*3/mL

## 2015-11-26 LAB — I-STAT CG4 LACTIC ACID, ED: Lactic Acid, Venous: 2.15 mmol/L (ref 0.5–1.9)

## 2015-11-26 LAB — GLUCOSE, CAPILLARY: GLUCOSE-CAPILLARY: 180 mg/dL — AB (ref 65–99)

## 2015-11-26 LAB — CBG MONITORING, ED: Glucose-Capillary: 260 mg/dL — ABNORMAL HIGH (ref 65–99)

## 2015-11-26 MED ORDER — VANCOMYCIN HCL IN DEXTROSE 1-5 GM/200ML-% IV SOLN: 1000.0000 mg | INTRAVENOUS | Status: DC

## 2015-11-26 MED ORDER — PIPERACILLIN-TAZOBACTAM 3.375 G IVPB 30 MIN: 3.3750 g | Freq: Once | INTRAVENOUS | Status: DC

## 2015-11-26 MED ORDER — PREGABALIN 75 MG PO CAPS
75.0000 mg | ORAL_CAPSULE | Freq: Two times a day (BID) | ORAL | Status: DC | PRN
  Administered 2015-11-28: 75 mg via ORAL
  Filled 2015-11-26: qty 1

## 2015-11-26 MED ORDER — SODIUM CHLORIDE 0.9 % IV BOLUS (SEPSIS)
1000.0000 mL | Freq: Once | INTRAVENOUS | Status: AC
  Administered 2015-11-26: 1000 mL via INTRAVENOUS

## 2015-11-26 MED ORDER — ONDANSETRON HCL 4 MG/2ML IJ SOLN: 4.0000 mg | Freq: Four times a day (QID) | INTRAMUSCULAR | Status: DC | PRN

## 2015-11-26 MED ORDER — ALBUTEROL SULFATE (2.5 MG/3ML) 0.083% IN NEBU
2.5000 mg | INHALATION_SOLUTION | RESPIRATORY_TRACT | Status: DC | PRN
  Administered 2015-11-28 – 2015-12-02 (×2): 2.5 mg via RESPIRATORY_TRACT
  Filled 2015-11-26 (×2): qty 3

## 2015-11-26 MED ORDER — PIPERACILLIN-TAZOBACTAM IN DEX 2-0.25 GM/50ML IV SOLN
2.2500 g | Freq: Three times a day (TID) | INTRAVENOUS | Status: DC
  Filled 2015-11-26: qty 50

## 2015-11-26 MED ORDER — ATORVASTATIN CALCIUM 20 MG PO TABS
20.0000 mg | ORAL_TABLET | Freq: Every day | ORAL | Status: DC
  Administered 2015-11-27 – 2015-12-02 (×6): 20 mg via ORAL
  Filled 2015-11-26: qty 1
  Filled 2015-11-26: qty 2
  Filled 2015-11-26 (×3): qty 1
  Filled 2015-11-26: qty 2

## 2015-11-26 MED ORDER — IPRATROPIUM-ALBUTEROL 0.5-2.5 (3) MG/3ML IN SOLN
RESPIRATORY_TRACT | Status: AC
  Administered 2015-11-26: 3 mL via RESPIRATORY_TRACT
  Filled 2015-11-26: qty 3

## 2015-11-26 MED ORDER — IPRATROPIUM-ALBUTEROL 0.5-2.5 (3) MG/3ML IN SOLN
3.0000 mL | Freq: Three times a day (TID) | RESPIRATORY_TRACT | Status: DC
  Administered 2015-11-26 – 2015-11-29 (×6): 3 mL via RESPIRATORY_TRACT
  Filled 2015-11-26 (×6): qty 3

## 2015-11-26 MED ORDER — DILTIAZEM LOAD VIA INFUSION
15.0000 mg | Freq: Once | INTRAVENOUS | Status: DC
  Filled 2015-11-26: qty 15

## 2015-11-26 MED ORDER — SODIUM CHLORIDE 0.9 % IV BOLUS (SEPSIS)
500.0000 mL | Freq: Once | INTRAVENOUS | Status: AC
  Administered 2015-11-26: 500 mL via INTRAVENOUS

## 2015-11-26 MED ORDER — ACETAMINOPHEN 325 MG PO TABS
650.0000 mg | ORAL_TABLET | ORAL | Status: DC | PRN
  Administered 2015-11-27: 650 mg via ORAL
  Filled 2015-11-26: qty 2

## 2015-11-26 MED ORDER — LEVOTHYROXINE SODIUM 150 MCG PO TABS
150.0000 ug | ORAL_TABLET | Freq: Every day | ORAL | Status: DC
  Filled 2015-11-26: qty 1

## 2015-11-26 MED ORDER — LEVOTHYROXINE SODIUM 75 MCG PO TABS
150.0000 ug | ORAL_TABLET | Freq: Every day | ORAL | Status: DC
  Administered 2015-11-27 – 2015-12-03 (×7): 150 ug via ORAL
  Filled 2015-11-26 (×7): qty 2

## 2015-11-26 MED ORDER — DARIFENACIN HYDROBROMIDE ER 15 MG PO TB24
15.0000 mg | ORAL_TABLET | Freq: Every day | ORAL | Status: DC
  Administered 2015-11-27 – 2015-12-03 (×6): 15 mg via ORAL
  Filled 2015-11-26 (×8): qty 1

## 2015-11-26 MED ORDER — POLYETHYLENE GLYCOL 3350 17 G PO PACK
17.0000 g | PACK | Freq: Every day | ORAL | Status: DC
  Administered 2015-11-27 – 2015-12-03 (×7): 17 g via ORAL
  Filled 2015-11-26 (×7): qty 1

## 2015-11-26 MED ORDER — VANCOMYCIN HCL IN DEXTROSE 1-5 GM/200ML-% IV SOLN
1000.0000 mg | Freq: Once | INTRAVENOUS | Status: AC
  Administered 2015-11-26: 1000 mg via INTRAVENOUS
  Filled 2015-11-26: qty 200

## 2015-11-26 MED ORDER — INSULIN ASPART 100 UNIT/ML ~~LOC~~ SOLN
0.0000 [IU] | Freq: Three times a day (TID) | SUBCUTANEOUS | Status: DC
  Administered 2015-11-27 (×3): 2 [IU] via SUBCUTANEOUS
  Administered 2015-11-28: 3 [IU] via SUBCUTANEOUS
  Administered 2015-11-28: 2 [IU] via SUBCUTANEOUS
  Administered 2015-11-28 – 2015-11-29 (×2): 3 [IU] via SUBCUTANEOUS
  Administered 2015-11-29: 5 [IU] via SUBCUTANEOUS
  Administered 2015-11-29: 2 [IU] via SUBCUTANEOUS
  Administered 2015-11-30: 3 [IU] via SUBCUTANEOUS
  Administered 2015-11-30: 2 [IU] via SUBCUTANEOUS
  Administered 2015-11-30: 3 [IU] via SUBCUTANEOUS
  Administered 2015-12-01 – 2015-12-03 (×7): 2 [IU] via SUBCUTANEOUS
  Administered 2015-12-03: 3 [IU] via SUBCUTANEOUS

## 2015-11-26 MED ORDER — PIPERACILLIN-TAZOBACTAM IN DEX 2-0.25 GM/50ML IV SOLN
2.2500 g | INTRAVENOUS | Status: AC
  Administered 2015-11-26: 2.25 g via INTRAVENOUS
  Filled 2015-11-26: qty 50

## 2015-11-26 MED ORDER — SODIUM CHLORIDE 0.9 % IV BOLUS (SEPSIS)
250.0000 mL | Freq: Once | INTRAVENOUS | Status: AC
  Administered 2015-11-26: 250 mL via INTRAVENOUS

## 2015-11-26 MED ORDER — OXYCODONE HCL 5 MG PO TABS: 5.0000 mg | ORAL_TABLET | Freq: Four times a day (QID) | ORAL | Status: DC | PRN

## 2015-11-26 MED ORDER — LATANOPROST 0.005 % OP SOLN
1.0000 [drp] | Freq: Every day | OPHTHALMIC | Status: DC
  Administered 2015-11-27 – 2015-12-02 (×6): 1 [drp] via OPHTHALMIC
  Filled 2015-11-26 (×2): qty 2.5

## 2015-11-26 MED ORDER — DILTIAZEM HCL-DEXTROSE 100-5 MG/100ML-% IV SOLN (PREMIX)
5.0000 mg/h | INTRAVENOUS | Status: DC
  Administered 2015-11-26: 5 mg/h via INTRAVENOUS
  Filled 2015-11-26 (×2): qty 100

## 2015-11-26 MED ORDER — PANTOPRAZOLE SODIUM 40 MG PO TBEC
40.0000 mg | DELAYED_RELEASE_TABLET | Freq: Every day | ORAL | Status: DC
  Administered 2015-11-27 – 2015-12-03 (×7): 40 mg via ORAL
  Filled 2015-11-26 (×7): qty 1

## 2015-11-26 MED ORDER — SACCHAROMYCES BOULARDII 250 MG PO CAPS
250.0000 mg | ORAL_CAPSULE | Freq: Two times a day (BID) | ORAL | Status: DC
  Administered 2015-11-27 – 2015-12-03 (×13): 250 mg via ORAL
  Filled 2015-11-26 (×14): qty 1

## 2015-11-26 MED ORDER — CALCITONIN (SALMON) 200 UNIT/ACT NA SOLN
1.0000 | Freq: Every day | NASAL | Status: DC
  Administered 2015-11-27 – 2015-12-03 (×5): 1 via NASAL
  Filled 2015-11-26: qty 3.7

## 2015-11-26 MED ORDER — DEXTROSE 5 % IV SOLN: 2.0000 g | Freq: Once | INTRAVENOUS | Status: DC

## 2015-11-26 MED ORDER — ATORVASTATIN CALCIUM 20 MG PO TABS: 20.0000 mg | ORAL_TABLET | Freq: Every day | ORAL | Status: DC

## 2015-11-26 MED ORDER — ASPIRIN EC 81 MG PO TBEC
81.0000 mg | DELAYED_RELEASE_TABLET | Freq: Every day | ORAL | Status: DC
  Administered 2015-11-27 – 2015-12-03 (×7): 81 mg via ORAL
  Filled 2015-11-26 (×7): qty 1

## 2015-11-26 MED ORDER — DEXTROSE 5 % IV SOLN: 1.0000 g | INTRAVENOUS | Status: DC

## 2015-11-26 NOTE — ED Provider Notes (Signed)
  Face-to-face evaluation   History: She presents for evaluation of elevated white blood cell count, and concern for recurrent urinary tract infection. Patient is somewhat somnolent but arouses easily, to communicate.  Physical exam: Elderly female, alert and cooperative. She is confused. Heart tachycardic rate and regular rhythm without murmur is clear. Abdomen soft and nontender.  Medical screening examination/treatment/procedure(s) were conducted as a shared visit with non-physician practitioner(s) and myself.  I personally evaluated the patient during the encounter   Mancel BaleElliott Yasemin Rabon, MD 11/27/15 2123

## 2015-11-26 NOTE — ED Notes (Addendum)
HR AFIB 130-140. PT WITHOUT COMPLAINT. HX OF THE SAME. EDPA CHRIS LAWYER PRESENT TO EVALUATE PT. PT DENIES CP AND SOB. CHARGE TERRI RN AND JENEEN RN UPDATED AND PT CURRENT STATUS.EKGPERFORMED AND GIVEN TO EDPA CHRIS

## 2015-11-26 NOTE — ED Notes (Signed)
I gave I stat CG4  Critical results to PA Ivin Poothris  Lawary

## 2015-11-26 NOTE — H&P (Addendum)
History and Physical    Tammy QuamWinifred D [name] ZOX:[id number] DOB: 1916/04/17 DOA: 11/26/2015   PCP: Murray HodgkinsArthur Green, MD Chief Complaint:  Chief Complaint  Patient presents with  . Altered Mental Status    HPI: [name] is a 82 y.o. female with medical history significant of A.Fib, not on anticoagulation due to prior AVM bleed, DM diet controlled.  Patient presents to the ED with c/o Lethargy and AMS.  Suspected sepsis per facility who sends her in.  She had recent admit last month for PNA.  Per facility WBC elevated.  At this point patient is at her baseline and has no complaints.  ED Course: HR 130s A.Fib RVR, WBC 16k, UA positive for UTI.  Given 2.5L bolus, zosyn / vanc.  Review of Systems: As per HPI otherwise 10 point review of systems negative.    Past Medical History:  Diagnosis Date  . Allergic rhinitis, cause unspecified   . Anemia   . Arthralgia of temporomandibular joint   . Arthropathy, unspecified, site unspecified   . Atrial fibrillation (HCC)   . Bronchitis, acute   . Carpal tunnel syndrome   . Chest pain, unspecified   . Chronic kidney disease, unspecified   . Closed dislocation, thoracic vertebra   . Closed fracture of lumbar vertebra without mention of spinal cord injury   . Diabetes type 2, uncontrolled (HCC)   . Diabetes with renal manifestations(250.4)   . Diastasis of muscle   . Eczema   . Gait disorder   . Hematuria, unspecified   . Hemoptysis   . History of nuclear stress test 06/06/2010   lexiscan; normal pattern of perfusion; low risk   . Hyperlipidemia   . Hyperpotassemia   . Hypertension   . Hypothyroid   . Insomnia, unspecified   . Macular degeneration (senile) of retina, unspecified   . Mild memory disturbance   . Myoclonus   . Myoclonus   . Obesity, unspecified   . Open wound of knee, leg (except thigh), and ankle, without mention of complication   . Osteoporosis   . Other atopic dermatitis and related conditions   . Other disorder of  calcium metabolism   . Other disorder of calcium metabolism   . Other specified idiopathic peripheral neuropathy   . Personal history of fall   . Personality change due to conditions classified elsewhere   . Scoliosis   . Seborrheic keratosis   . Trigger finger (acquired)   . Type II or unspecified type diabetes mellitus without mention of complication, not stated as uncontrolled   . Unspecified closed fracture of pelvis   . Unspecified constipation   . Unspecified glaucoma(365.9)   . Unspecified hereditary and idiopathic peripheral neuropathy   . Unspecified hypertrophic and atrophic condition of skin   . Unspecified pruritic disorder   . Unspecified urinary incontinence   . Unspecified vitamin D deficiency   . Urinary tract infection, site not specified   . Varicose veins of lower extremities with inflammation     Past Surgical History:  Procedure Laterality Date  . ABDOMINAL HYSTERECTOMY     1973  . BIOPSY BREAST     2000  . CORONARY ARTERY BYPASS GRAFT     Dr Tyrone SageGerhardt 04/1996  . ESOPHAGOGASTRODUODENOSCOPY N/A 08/17/2012   Procedure: ESOPHAGOGASTRODUODENOSCOPY (EGD);  Surgeon: Hilarie FredricksonJohn N Perry, MD;  [facility];  Service: Endoscopy;  Laterality: N/A;  . HOT HEMOSTASIS N/A 08/17/2012   Procedure: HOT HEMOSTASIS (ARGON PLASMA COAGULATION/BICAP);  Surgeon: Hilarie FredricksonJohn N Perry,  MD;  Location: [facility];  Service: Endoscopy;  Laterality: N/A;  . KNEE ARTHROSCOPY     left Dr Thurston Hole   . LASER LAPAROSCOPY     right eye 04/2005  . LUMBAR SPINE SURGERY     Dr Newell Coral   . RIGHT OOPHORECTOMY     1950     reports that she has never smoked. She has never used smokeless tobacco. She reports that she drinks alcohol. She reports that she does not use drugs.  Allergies  Allergen Reactions  . Cosopt [Dorzolamide Hcl-Timolol Mal] Other (See Comments)    Redness and burning in both eyes  . Noroxin [Norfloxacin] Swelling  . Sulfa Antibiotics Nausea And Vomiting  . Prozac [Fluoxetine  Hcl] Rash    Family History  Problem Relation Age of Onset  . Diabetes Mother   . Cancer Sister     colon  . Alzheimer's disease Sister   . Prostate cancer Son       Prior to Admission medications   Medication Sig Start Date End Date Taking? Authorizing Provider  aspirin EC 81 MG tablet Take 1 tablet (81 mg total) by mouth daily. 10/10/12  Yes Meredeth Ide, MD  atorvastatin (LIPITOR) 20 MG tablet Take one tablet by mouth once daily to lower cholesterol 08/01/15  Yes Kimber Relic, MD  calcitonin, salmon, (MIACALCIN/FORTICAL) 200 UNIT/ACT nasal spray INSTILL 1 SPRAY INTO ALTERNATING NOSTRILS ONCE A DAY FOR OSTEOPROSIS 06/11/15  Yes Kimber Relic, MD  dextromethorphan-guaiFENesin [facility] DM) 30-600 MG per 12 hr tablet Take 1 tablet by mouth every 12 (twelve) hours as needed for cough.    Yes Historical Provider, MD  furosemide (LASIX) 20 MG tablet Take 1 tablet (20 mg total) by mouth daily. 11/20/15  Yes Zannie Cove, MD  ipratropium-albuterol (DUONEB) 0.5-2.5 (3) MG/3ML SOLN Take 3 mLs by nebulization 3 (three) times daily.   Yes Historical Provider, MD  lansoprazole (PREVACID) 30 MG capsule Take one capsule by mouth once daily for stomach Patient taking differently: Take 30 mg by mouth daily.  09/10/15  Yes Kimber Relic, MD  latanoprost (XALATAN) 0.005 % ophthalmic solution Place 1 drop into the left eye at bedtime.  11/17/12  Yes Historical Provider, MD  levothyroxine (SYNTHROID, LEVOTHROID) 150 MCG tablet Take 150 mcg by mouth daily before breakfast.   Yes Historical Provider, MD  Multiple Vitamins-Minerals (CENTRUM SILVER PO) Take 1 tablet by mouth daily.   Yes Historical Provider, MD  oxyCODONE (ROXICODONE) 5 MG immediate release tablet One every 6 hours if needed for severe pain Patient taking differently: Take 5 mg by mouth every 6 (six) hours as needed for severe pain. One every 6 hours if needed for severe pain 11/21/15  Yes Jeralyn Bennett, MD  polyethylene glycol (MIRALAX /  GLYCOLAX) packet Take 17 g by mouth daily. 10/10/12  Yes Meredeth Ide, MD  pregabalin (LYRICA) 75 MG capsule Take 1 capsule (75 mg total) by mouth 2 (two) times daily. Take one capsule by mouth twice daily as needed for pain and nerves Patient taking differently: Take 75 mg by mouth 2 (two) times daily as needed (pain).  11/20/15  Yes Zannie Cove, MD  saccharomyces boulardii (FLORASTOR) 250 MG capsule Take 1 capsule (250 mg total) by mouth 2 (two) times daily. 05/04/13  Yes Tiffany L Reed, DO  Trospium Chloride 60 MG CP24 Take one capsule by mouth once daily for bladder control Patient taking differently: Take 60 mg by mouth daily. Take one capsule by  mouth once daily for bladder control 07/20/15  Yes Kimber Relic, MD    Physical Exam: Vitals:   11/26/15 1859 11/26/15 1916 11/26/15 1937 11/26/15 2018  BP: (!) 110/54 (!) 102/47  99/71  Pulse: 87 81 109 (!) 132  Resp: 16 12 16 18   Temp:    98.1 F (36.7 C)  TempSrc:    Rectal  SpO2: 98% 99% 98% 97%  Weight:      Height:          Constitutional: NAD, calm, comfortable Eyes: PERRL, lids and conjunctivae normal ENMT: Mucous membranes are moist. Posterior pharynx clear of any exudate or lesions.Normal dentition.  Neck: normal, supple, no masses, no thyromegaly Respiratory: clear to auscultation bilaterally, no wheezing, no crackles. Normal respiratory effort. No accessory muscle use.  Cardiovascular: Regular rate and rhythm, no murmurs / rubs / gallops. No extremity edema. 2+ pedal pulses. No carotid bruits.  Abdomen: no tenderness, no masses palpated. No hepatosplenomegaly. Bowel sounds positive.  Musculoskeletal: no clubbing / cyanosis. No joint deformity upper and lower extremities. Good ROM, no contractures. Normal muscle tone.  Skin: no rashes, lesions, ulcers. No induration Neurologic: CN 2-12 grossly intact. Sensation intact, DTR normal. Strength 5/5 in all 4.  Psychiatric: Normal judgment and insight. Alert and oriented x 3.  Normal mood.    Labs on Admission: I have personally reviewed following labs and imaging studies  CBC:  Recent Labs Lab 11/20/15 0658 11/21/15 0547 11/26/15 1741  WBC 10.2 10.2 16.4*  NEUTROABS  --   --  12.7*  HGB 10.3* 10.6* 10.1*  HCT 33.3* 34.0* 31.4*  MCV 98.5 98.6 96.0  PLT 272 266 352   Basic Metabolic Panel:  Recent Labs Lab 11/20/15 0658 11/21/15 0547 11/26/15 1741  NA 144 143 138  K 4.0 4.1 4.3  CL 113* 108 103  CO2 22 21* 26  GLUCOSE 182* 173* 229*  BUN 31* 34* 40*  CREATININE 1.98* 2.15* 2.60*  CALCIUM 10.8* 10.4* 10.1   GFR: Estimated Creatinine Clearance: 10.4 mL/min (by C-G formula based on SCr of 2.6 mg/dL (H)). Liver Function Tests:  Recent Labs Lab 11/26/15 1741  AST 17  ALT 13*  ALKPHOS 79  BILITOT 0.7  PROT 6.7  ALBUMIN 3.1*   No results for input(s): LIPASE, AMYLASE in the last 168 hours. No results for input(s): AMMONIA in the last 168 hours. Coagulation Profile: No results for input(s): INR, PROTIME in the last 168 hours. Cardiac Enzymes: No results for input(s): CKTOTAL, CKMB, CKMBINDEX, TROPONINI in the last 168 hours. BNP (last 3 results) No results for input(s): PROBNP in the last 8760 hours. HbA1C: No results for input(s): HGBA1C in the last 72 hours. CBG:  Recent Labs Lab 11/20/15 1700 11/20/15 2138 11/21/15 0753 11/21/15 1202 11/26/15 1635  GLUCAP 98 154* 183* 224* 260*   Lipid Profile: No results for input(s): CHOL, HDL, LDLCALC, TRIG, CHOLHDL, LDLDIRECT in the last 72 hours. Thyroid Function Tests: No results for input(s): TSH, T4TOTAL, FREET4, T3FREE, THYROIDAB in the last 72 hours. Anemia Panel: No results for input(s): VITAMINB12, FOLATE, FERRITIN, TIBC, IRON, RETICCTPCT in the last 72 hours. Urine analysis:    Component Value Date/Time   COLORURINE YELLOW 11/26/2015 2013   APPEARANCEUR CLOUDY (A) 11/26/2015 2013   APPEARANCEUR Cloudy (A) 08/29/2014 1428   LABSPEC 1.015 11/26/2015 2013   PHURINE 5.0  11/26/2015 2013   GLUCOSEU NEGATIVE 11/26/2015 2013   HGBUR NEGATIVE 11/26/2015 2013   BILIRUBINUR NEGATIVE 11/26/2015 2013   BILIRUBINUR negative  12/13/2014 1217   BILIRUBINUR Negative 08/29/2014 1428   KETONESUR NEGATIVE 11/26/2015 2013   PROTEINUR NEGATIVE 11/26/2015 2013   UROBILINOGEN negative 12/13/2014 1217   UROBILINOGEN 0.2 07/12/2014 1251   NITRITE NEGATIVE 11/26/2015 2013   LEUKOCYTESUR LARGE (A) 11/26/2015 2013   LEUKOCYTESUR 3+ (A) 08/29/2014 1428   Sepsis Labs: @LABRCNTIP (procalcitonin:4,lacticidven:4) )No results found for this or any previous visit (from the past 240 hour(s)).   Radiological Exams on Admission: Dg Chest Port 1 View  Result Date: 11/26/2015 CLINICAL DATA:  Sepsis.  Altered mental status today. EXAM: PORTABLE CHEST 1 VIEW COMPARISON:  PA and lateral chest 11/18/2015 and 10/07/2012. Single view of the chest 11/13/2015. FINDINGS: Interstitial edema seen on the most recent examination has resolved. Mild left basilar atelectasis is noted. The lungs are otherwise clear. Heart size is upper normal. Aortic atherosclerosis is seen. The patient is status post CABG. IMPRESSION: Subsegmental atelectasis left lung base. Interstitial edema on the most recent examination has resolved. Atherosclerosis. Electronically Signed   By: Drusilla Kanner M.D.   On: 11/26/2015 18:22    EKG: Independently reviewed.  Assessment/Plan Principal Problem:   Atrial fibrillation with RVR (HCC) Active Problems:   Lower urinary tract infectious disease   CKD stage 4 due to type 2 diabetes mellitus (HCC)   Chronic combined systolic and diastolic CHF (congestive heart failure) (HCC)    1. A.Fib RVR - 1. cardizem gtt for rate control 2. Tele monitor 3. 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 Blanchie Dessert 10/18/12 office note). 4. H/o hypothyroidism but TSH last checked 8 days ago and was high so synthroid was increased, needs recheck in another 3 weeks  or so. 2. UTI - 1. patient does not appear septic or toxic on evaluation 2. Will switch ABx to rocephin 3. KVO fluids for now 4. Holding lasix for tomorrow 3. CKD stage 4 - chronic and baseline 4. DM2 - looks to be on no meds at home 1. Will put on sensitive scale SSI AC/HS for now 5. Chronic CHF - 1. watch for fluid overload with fluid resuscitation 2. Holding lasix for the moment but may wish to resume this med soon depending how she does overnight   DVT prophylaxis: SCDs Code Status: DNR Family Communication: Son at bedside Consults called: none Admission status: Admit to inpatient   Hillary Bow DO [facility] Pager [phone] from 7PM-7AM  If 7AM-7PM, please contact the day physician for the patient [email or web address] Password [email or web address]  11/26/2015, 9:29 PM

## 2015-11-26 NOTE — ED Triage Notes (Signed)
Per EMS[facility]  Pt there with sepsis.  Unknown source.  Pt woke up this morning lethargic and AMS.  Per facility, WBC elevated.  Baseline orientation normally alert, ? Dementia.  Vitals:  105/45, hr 86, 90% ra, wl per [place] placed, CBG 115

## 2015-11-26 NOTE — ED Notes (Signed)
Patient is currently drinking ginger ale.

## 2015-11-26 NOTE — ED Notes (Signed)
Bed: WA17 Expected date:  Expected time:  Means of arrival:  Comments: 82 yo F AMS

## 2015-11-26 NOTE — ED Notes (Addendum)
LAB CALLED- ALL BLOOD COLLECTED ON THIS PT EARLIER WAS DISCARDED. NEEDS RECOLLECTION.  ALL BLOOD RECOLLECTED. BLOOD CULTURE X 1 OBTAINED AFTER ANTIBIOTIC. CHARGE MARTHA RN MADE AWARE.

## 2015-11-26 NOTE — Progress Notes (Signed)
Pharmacy Antibiotic Note  [name] is a 82 y.o. female admitted on 11/26/2015 with sepsis.  Pharmacy has been consulted for Vancomycin and Zosyn dosing.  Plan: Vancomycin 1g IV q48h. Plan for Vancomycin trough level at steady state. Goal trough level 15-20 mcg/mL. Zosyn 2.25g IV q8h.  Monitor renal function, cultures, clinical course   Height: 4\' 10"  (147.3 cm) Weight: 158 lb (71.7 kg) IBW/kg (Calculated) : 40.9  Temp (24hrs), Avg:98.8 F (37.1 C), Min:98.8 F (37.1 C), Max:98.8 F (37.1 C)   Recent Labs Lab 11/20/15 0658 11/21/15 0547 11/26/15 1741 11/26/15 1840  WBC 10.2 10.2 16.4*  --   CREATININE 1.98* 2.15* 2.60*  --   LATICACIDVEN  --   --   --  2.15*    Estimated Creatinine Clearance: 10.4 mL/min (by C-G formula based on SCr of 2.6 mg/dL (H)).    Allergies  Allergen Reactions  . Cosopt [Dorzolamide Hcl-Timolol Mal] Other (See Comments)    Redness and burning in both eyes  . Noroxin [Norfloxacin] Swelling  . Sulfa Antibiotics Nausea And Vomiting  . Prozac [Fluoxetine Hcl] Rash    Antimicrobials this admission: 10/2 >> Vancomycin >> 10/2 >> Zosyn >>  Dose adjustments this admission: --  Microbiology results: 10/2 BCx: sent 10/2 UCx: sent   Thank you for allowing pharmacy to be a part of this patient's care.   Greer PickerelJigna Araceli Coufal, PharmD, BCPS Pager: [phone] 11/26/2015 6:00 PM

## 2015-11-27 LAB — BASIC METABOLIC PANEL
Anion gap: 6 (ref 5–15)
BUN: 34 mg/dL — AB (ref 6–20)
CALCIUM: 8.4 mg/dL — AB (ref 8.9–10.3)
CHLORIDE: 111 mmol/L (ref 101–111)
CO2: 22 mmol/L (ref 22–32)
CREATININE: 1.94 mg/dL — AB (ref 0.44–1.00)
GFR, EST AFRICAN AMERICAN: 24 mL/min — AB (ref >60)
GFR, EST NON AFRICAN AMERICAN: 21 mL/min — AB (ref >60)
Glucose, Bld: 188 mg/dL — ABNORMAL HIGH (ref 65–99)
Potassium: 3.8 mmol/L (ref 3.5–5.1)
SODIUM: 139 mmol/L (ref 135–145)

## 2015-11-27 LAB — GLUCOSE, CAPILLARY
GLUCOSE-CAPILLARY: 183 mg/dL — AB (ref 65–99)
GLUCOSE-CAPILLARY: 200 mg/dL — AB (ref 65–99)
GLUCOSE-CAPILLARY: 160 mg/dL — AB (ref 65–99)
GLUCOSE-CAPILLARY: 159 mg/dL — AB (ref 65–99)

## 2015-11-27 LAB — CBC
HCT: 28.7 % — ABNORMAL LOW (ref 36.0–46.0)
Hemoglobin: 9 g/dL — ABNORMAL LOW (ref 12.0–15.0)
MCH: 31.6 pg (ref 26.0–34.0)
MCHC: 31.4 g/dL (ref 30.0–36.0)
MCV: 100.7 fL — AB (ref 78.0–100.0)
PLATELETS: 365 10*3/uL (ref 150–400)
RBC: 2.85 MIL/uL — AB (ref 3.87–5.11)
RDW: 15.4 % (ref 11.5–15.5)
WBC: 14.9 10*3/uL — AB (ref 4.0–10.5)

## 2015-11-27 LAB — LACTIC ACID, PLASMA
LACTIC ACID, VENOUS: 1.2 mmol/L (ref 0.5–1.9)
LACTIC ACID, VENOUS: 1.1 mmol/L (ref 0.5–1.9)

## 2015-11-27 LAB — MRSA PCR SCREENING: MRSA BY PCR: NEGATIVE

## 2015-11-27 MED ORDER — VANCOMYCIN HCL IN DEXTROSE 1-5 GM/200ML-% IV SOLN: 1000.0000 mg | INTRAVENOUS | Status: DC

## 2015-11-27 MED ORDER — SODIUM CHLORIDE 0.9 % IV BOLUS (SEPSIS)
1000.0000 mL | Freq: Once | INTRAVENOUS | Status: AC
  Administered 2015-11-27: 1000 mL via INTRAVENOUS

## 2015-11-27 MED ORDER — LIP MEDEX EX OINT
TOPICAL_OINTMENT | CUTANEOUS | Status: AC
  Administered 2015-11-27: 16:00:00
  Filled 2015-11-27: qty 7

## 2015-11-27 MED ORDER — PIPERACILLIN-TAZOBACTAM IN DEX 2-0.25 GM/50ML IV SOLN
2.2500 g | Freq: Three times a day (TID) | INTRAVENOUS | Status: DC
  Administered 2015-11-27 – 2015-11-30 (×9): 2.25 g via INTRAVENOUS
  Filled 2015-11-27 (×10): qty 50

## 2015-11-27 MED ORDER — SODIUM CHLORIDE 0.9 % IV SOLN: INTRAVENOUS | Status: DC

## 2015-11-27 MED ORDER — PIPERACILLIN-TAZOBACTAM IN DEX 2-0.25 GM/50ML IV SOLN
2.2500 g | INTRAVENOUS | Status: AC
  Administered 2015-11-27: 2.25 g via INTRAVENOUS
  Filled 2015-11-27: qty 50

## 2015-11-27 MED ORDER — METOPROLOL TARTRATE 5 MG/5ML IV SOLN
5.0000 mg | Freq: Four times a day (QID) | INTRAVENOUS | Status: DC | PRN
  Administered 2015-11-28: 5 mg via INTRAVENOUS
  Filled 2015-11-27: qty 5

## 2015-11-27 MED ORDER — PHENYLEPHRINE HCL 10 MG/ML IJ SOLN
0.0000 ug/min | INTRAVENOUS | Status: DC
  Administered 2015-11-27: 50 ug/min via INTRAVENOUS
  Administered 2015-11-27: 20 ug/min via INTRAVENOUS
  Filled 2015-11-27 (×3): qty 1

## 2015-11-27 NOTE — Progress Notes (Signed)
BP at 0115 57/33.  RN attempted to wake up patient. Only minimally responsive to sternal rub.  Rectal temp 97.3.  MD paged and Dr. Julian ReilGardner to bedside at 0130. Orders for neo-synephrine 0-50 mcg given.  Son updated by MD via phone.  BP at 0145 97/57 (71). Will continue to monitor.

## 2015-11-27 NOTE — ED Provider Notes (Signed)
[facility]-EMERGENCY DEPT Provider Note   CSN: [id number] Arrival date & time: 11/26/15  1620     History   Chief Complaint Chief Complaint  Patient presents with  . Altered Mental Status    HPI [name] is a 82 y.o. female.  HPI Patient presents to the emergency department with altered mental status and lethargy that started this morning.  The patient's son gives me the history.  Patient is unable to give any history at this time.  Son states that last night she seemed in her normal state and then this morning when he saw her.  She was lethargic and had altered mental status.  The patient had laboratory testing done at the facility today, which showed an elevated white blood cell count.  She was sent in to the emergency department for further evaluation.  The son states that the patient has not had any vomiting or diarrhea Past Medical History:  Diagnosis Date  . Allergic rhinitis, cause unspecified   . Anemia   . Arthralgia of temporomandibular joint   . Arthropathy, unspecified, site unspecified   . Atrial fibrillation (HCC)   . Bronchitis, acute   . Carpal tunnel syndrome   . Chest pain, unspecified   . Chronic kidney disease, unspecified   . Closed dislocation, thoracic vertebra   . Closed fracture of lumbar vertebra without mention of spinal cord injury   . Diabetes type 2, uncontrolled (HCC)   . Diabetes with renal manifestations(250.4)   . Diastasis of muscle   . Eczema   . Gait disorder   . Hematuria, unspecified   . Hemoptysis   . History of nuclear stress test 06/06/2010   lexiscan; normal pattern of perfusion; low risk   . Hyperlipidemia   . Hyperpotassemia   . Hypertension   . Hypothyroid   . Insomnia, unspecified   . Macular degeneration (senile) of retina, unspecified   . Mild memory disturbance   . Myoclonus   . Myoclonus   . Obesity, unspecified   . Open wound of knee, leg (except thigh), and ankle, without mention of complication   . Osteoporosis    . Other atopic dermatitis and related conditions   . Other disorder of calcium metabolism   . Other disorder of calcium metabolism   . Other specified idiopathic peripheral neuropathy   . Personal history of fall   . Personality change due to conditions classified elsewhere   . Scoliosis   . Seborrheic keratosis   . Trigger finger (acquired)   . Type II or unspecified type diabetes mellitus without mention of complication, not stated as uncontrolled   . Unspecified closed fracture of pelvis   . Unspecified constipation   . Unspecified glaucoma(365.9)   . Unspecified hereditary and idiopathic peripheral neuropathy   . Unspecified hypertrophic and atrophic condition of skin   . Unspecified pruritic disorder   . Unspecified urinary incontinence   . Unspecified vitamin D deficiency   . Urinary tract infection, site not specified   . Varicose veins of lower extremities with inflammation     Patient Active Problem List   Diagnosis Date Noted  . Atrial fibrillation with RVR (HCC) 11/26/2015  . Chronic combined systolic and diastolic CHF (congestive heart failure) (HCC) 11/26/2015  . Severe sepsis (HCC)   . Aspiration pneumonia of right lower lobe due to vomit (HCC)   . Acute renal failure superimposed on stage 4 chronic kidney disease (HCC)   . Controlled diabetes mellitus type 2 with complications (  HCC)   . Sepsis (HCC) 11/13/2015  . CAP (community acquired pneumonia) 11/13/2015  . Diabetes mellitus with complication (HCC)   . Calcaneal bursitis (heel) 09/11/2015  . S/P CABG (coronary artery bypass graft) 04/11/2015  . Leg pain, inferior 04/03/2015  . Pain in heel 04/03/2015  . CKD stage 4 due to type 2 diabetes mellitus (HCC) 04/25/2014  . Diabetic nephropathy associated with type 2 diabetes mellitus (HCC) 04/25/2014  . Contusion of foot 01/17/2014  . Diabetic neuropathy (HCC) 09/14/2013  . Pain in joint, ankle and foot 05/11/2013  . Weakness 10/07/2012  . AKI (acute kidney  injury) (HCC) 10/07/2012  . GI AVM (gastrointestinal arteriovenous vascular malformation) 08/17/2012  . Gastritis and gastroduodenitis 08/17/2012  . GI bleed 08/14/2012  . HTN (hypertension) 08/14/2012  . Dementia 08/14/2012  . Altered mental status 08/14/2012  . Lower urinary tract infectious disease 08/14/2012  . Anemia   . Hypothyroid   . Hyperlipidemia   . Hyperpotassemia   . Paroxysmal atrial fibrillation (HCC)   . Hereditary and idiopathic peripheral neuropathy   . DM (diabetes mellitus), type 2 with renal complications (HCC)   . Hypercalcemia   . Cough 05/06/2011    Past Surgical History:  Procedure Laterality Date  . ABDOMINAL HYSTERECTOMY     1973  . BIOPSY BREAST     2000  . CORONARY ARTERY BYPASS GRAFT     Dr Tyrone Sage 04/1996  . ESOPHAGOGASTRODUODENOSCOPY N/A 08/17/2012   Procedure: ESOPHAGOGASTRODUODENOSCOPY (EGD);  Surgeon: Hilarie Fredrickson, MD;  Location: [facility];  Service: Endoscopy;  Laterality: N/A;  . HOT HEMOSTASIS N/A 08/17/2012   Procedure: HOT HEMOSTASIS (ARGON PLASMA COAGULATION/BICAP);  Surgeon: Hilarie Fredrickson, MD;  Location: [facility];  Service: Endoscopy;  Laterality: N/A;  . KNEE ARTHROSCOPY     left Dr Thurston Hole   . LASER LAPAROSCOPY     right eye 04/2005  . LUMBAR SPINE SURGERY     Dr Newell Coral   . RIGHT OOPHORECTOMY     1950    OB History    No data available       Home Medications    Prior to Admission medications   Medication Sig Start Date End Date Taking? Authorizing Provider  aspirin EC 81 MG tablet Take 1 tablet (81 mg total) by mouth daily. 10/10/12  Yes Meredeth Ide, MD  atorvastatin (LIPITOR) 20 MG tablet Take one tablet by mouth once daily to lower cholesterol 08/01/15  Yes Kimber Relic, MD  calcitonin, salmon, (MIACALCIN/FORTICAL) 200 UNIT/ACT nasal spray INSTILL 1 SPRAY INTO ALTERNATING NOSTRILS ONCE A DAY FOR OSTEOPROSIS 06/11/15  Yes Kimber Relic, MD  dextromethorphan-guaiFENesin [facility] DM) 30-600 MG per 12 hr tablet Take  1 tablet by mouth every 12 (twelve) hours as needed for cough.    Yes Historical Provider, MD  furosemide (LASIX) 20 MG tablet Take 1 tablet (20 mg total) by mouth daily. 11/20/15  Yes Zannie Cove, MD  ipratropium-albuterol (DUONEB) 0.5-2.5 (3) MG/3ML SOLN Take 3 mLs by nebulization 3 (three) times daily.   Yes Historical Provider, MD  lansoprazole (PREVACID) 30 MG capsule Take one capsule by mouth once daily for stomach Patient taking differently: Take 30 mg by mouth daily.  09/10/15  Yes Kimber Relic, MD  latanoprost (XALATAN) 0.005 % ophthalmic solution Place 1 drop into the left eye at bedtime.  11/17/12  Yes Historical Provider, MD  levothyroxine (SYNTHROID, LEVOTHROID) 150 MCG tablet Take 150 mcg by mouth daily before breakfast.   Yes Historical Provider,  MD  Multiple Vitamins-Minerals (CENTRUM SILVER PO) Take 1 tablet by mouth daily.   Yes Historical Provider, MD  oxyCODONE (ROXICODONE) 5 MG immediate release tablet One every 6 hours if needed for severe pain Patient taking differently: Take 5 mg by mouth every 6 (six) hours as needed for severe pain. One every 6 hours if needed for severe pain 11/21/15  Yes Jeralyn Bennett, MD  polyethylene glycol (MIRALAX / GLYCOLAX) packet Take 17 g by mouth daily. 10/10/12  Yes Meredeth Ide, MD  pregabalin (LYRICA) 75 MG capsule Take 1 capsule (75 mg total) by mouth 2 (two) times daily. Take one capsule by mouth twice daily as needed for pain and nerves Patient taking differently: Take 75 mg by mouth 2 (two) times daily as needed (pain).  11/20/15  Yes Zannie Cove, MD  saccharomyces boulardii (FLORASTOR) 250 MG capsule Take 1 capsule (250 mg total) by mouth 2 (two) times daily. 05/04/13  Yes Tiffany L Reed, DO  Trospium Chloride 60 MG CP24 Take one capsule by mouth once daily for bladder control Patient taking differently: Take 60 mg by mouth daily. Take one capsule by mouth once daily for bladder control 07/20/15  Yes Kimber Relic, MD    Family  History Family History  Problem Relation Age of Onset  . Diabetes Mother   . Cancer Sister     colon  . Alzheimer's disease Sister   . Prostate cancer Son     Social History Social History  Substance Use Topics  . Smoking status: Never Smoker  . Smokeless tobacco: Never Used  . Alcohol use Yes     Comment: occasional wine     Allergies   Cosopt [dorzolamide hcl-timolol mal]; Noroxin [norfloxacin]; Sulfa antibiotics; and Prozac [fluoxetine hcl]   Review of Systems Review of Systems Level V caveat applies due to altered mental status  Physical Exam Updated Vital Signs BP (!) 68/46   Pulse 66   Temp 97.6 F (36.4 C) (Oral)   Resp 16   Ht 5\' 1"  (1.549 m)   Wt 75.8 kg   SpO2 96%   BMI 31.57 kg/m   Physical Exam  Constitutional: She appears well-developed and well-nourished. No distress.  HENT:  Head: Normocephalic and atraumatic.  Mouth/Throat: Oropharynx is clear and moist.  Eyes: Pupils are equal, round, and reactive to light.  Neck: Normal range of motion. Neck supple.  Cardiovascular: Normal rate, regular rhythm and normal heart sounds.  Exam reveals no gallop and no friction rub.   No murmur heard. Pulmonary/Chest: Effort normal and breath sounds normal. No respiratory distress. She has no wheezes.  Abdominal: Soft. Bowel sounds are normal. She exhibits no distension. There is no tenderness.  Neurological: She is alert. She exhibits normal muscle tone. Coordination normal.  Skin: Skin is warm and dry. No rash noted. No erythema.  Psychiatric: She has a normal mood and affect. Her behavior is normal.  Nursing note and vitals reviewed.    ED Treatments / Results  Labs (all labs ordered are listed, but only abnormal results are displayed) Labs Reviewed  COMPREHENSIVE METABOLIC PANEL - Abnormal; Notable for the following:       Result Value   Glucose, Bld 229 (*)    BUN 40 (*)    Creatinine, Ser 2.60 (*)    Albumin 3.1 (*)    ALT 13 (*)    GFR calc  non Af Amer 14 (*)    GFR calc Af Amer 17 (*)  All other components within normal limits  CBC WITH DIFFERENTIAL/PLATELET - Abnormal; Notable for the following:    WBC 16.4 (*)    RBC 3.27 (*)    Hemoglobin 10.1 (*)    HCT 31.4 (*)    Neutro Abs 12.7 (*)    Monocytes Absolute 1.2 (*)    All other components within normal limits  URINALYSIS, ROUTINE W REFLEX MICROSCOPIC (NOT AT [facility]) - Abnormal; Notable for the following:    APPearance CLOUDY (*)    Leukocytes, UA LARGE (*)    All other components within normal limits  URINE MICROSCOPIC-ADD ON - Abnormal; Notable for the following:    Squamous Epithelial / LPF 0-5 (*)    Bacteria, UA MANY (*)    All other components within normal limits  GLUCOSE, CAPILLARY - Abnormal; Notable for the following:    Glucose-Capillary 180 (*)    All other components within normal limits  CBG MONITORING, ED - Abnormal; Notable for the following:    Glucose-Capillary 260 (*)    All other components within normal limits  I-STAT CG4 LACTIC ACID, ED - Abnormal; Notable for the following:    Lactic Acid, Venous 2.15 (*)    All other components within normal limits  MRSA PCR SCREENING  CULTURE, BLOOD (ROUTINE X 2)  CULTURE, BLOOD (ROUTINE X 2)  URINE CULTURE  BASIC METABOLIC PANEL  CBC  I-STAT CG4 LACTIC ACID, ED    EKG  EKG Interpretation  Date/Time:  Monday November 26 2015 17:45:19 EDT Ventricular Rate:  85 PR Interval:    QRS Duration: 119 QT Interval:  411 QTC Calculation: 489 R Axis:   -63 Text Interpretation:  Sinus rhythm Ventricular premature complex Incomplete RBBB and LAFB Nonspecific T abnormalities, lateral leads since last tracing no significant change Confirmed by Effie ShyWENTZ  MD, ELLIOTT [phone]([id number]) on 11/26/2015 7:10:58 PM       Radiology Dg Chest Port 1 View  Result Date: 11/26/2015 CLINICAL DATA:  Sepsis.  Altered mental status today. EXAM: PORTABLE CHEST 1 VIEW COMPARISON:  PA and lateral chest 11/18/2015 and 10/07/2012. Single  view of the chest 11/13/2015. FINDINGS: Interstitial edema seen on the most recent examination has resolved. Mild left basilar atelectasis is noted. The lungs are otherwise clear. Heart size is upper normal. Aortic atherosclerosis is seen. The patient is status post CABG. IMPRESSION: Subsegmental atelectasis left lung base. Interstitial edema on the most recent examination has resolved. Atherosclerosis. Electronically Signed   By: Drusilla Kannerhomas  Dalessio M.D.   On: 11/26/2015 18:22    Procedures Procedures (including critical care time)  Medications Ordered in ED Medications  cefTRIAXone (ROCEPHIN) 1 g in dextrose 5 % 50 mL IVPB (not administered)  acetaminophen (TYLENOL) tablet 650 mg (not administered)  ondansetron (ZOFRAN) injection 4 mg (not administered)  diltiazem (CARDIZEM) 100 mg in dextrose 5% 100mL (1 mg/mL) infusion (0 mg/hr Intravenous Paused 11/27/15 0004)  ipratropium-albuterol (DUONEB) 0.5-2.5 (3) MG/3ML nebulizer solution 3 mL (3 mLs Nebulization Given 11/26/15 2203)  pantoprazole (PROTONIX) EC tablet 40 mg (not administered)  latanoprost (XALATAN) 0.005 % ophthalmic solution 1 drop (not administered)  aspirin EC tablet 81 mg (not administered)  pregabalin (LYRICA) capsule 75 mg (not administered)  saccharomyces boulardii (FLORASTOR) capsule 250 mg (not administered)  darifenacin (ENABLEX) 24 hr tablet 15 mg (not administered)  oxyCODONE (Oxy IR/ROXICODONE) immediate release tablet 5 mg (not administered)  polyethylene glycol (MIRALAX / GLYCOLAX) packet 17 g (not administered)  calcitonin (salmon) (MIACALCIN/FORTICAL) nasal spray 1 spray (not administered)  insulin aspart (  novoLOG) injection 0-9 Units (not administered)  albuterol (PROVENTIL) (2.5 MG/3ML) 0.083% nebulizer solution 2.5 mg (not administered)  atorvastatin (LIPITOR) tablet 20 mg (not administered)  levothyroxine (SYNTHROID, LEVOTHROID) tablet 150 mcg (not administered)  0.9 %  sodium chloride infusion ( Intravenous  Rate/Dose Change 11/27/15 0015)  sodium chloride 0.9 % bolus 1,000 mL (1,000 mLs Intravenous New Bag/Given 11/26/15 1817)    And  sodium chloride 0.9 % bolus 1,000 mL (0 mLs Intravenous Stopped 11/26/15 1953)    And  sodium chloride 0.9 % bolus 500 mL (500 mLs Intravenous New Bag/Given 11/26/15 2216)  vancomycin (VANCOCIN) IVPB 1000 mg/200 mL premix (0 mg Intravenous Stopped 11/26/15 1952)  piperacillin-tazobactam (ZOSYN) IVPB 2.25 g (0 g Intravenous Stopped 11/26/15 1843)  sodium chloride 0.9 % bolus 1,000 mL (0 mLs Intravenous Stopped 11/26/15 2216)    And  sodium chloride 0.9 % bolus 1,000 mL (0 mLs Intravenous Stopped 11/26/15 2215)    And  sodium chloride 0.9 % bolus 250 mL (250 mLs Intravenous New Bag/Given 11/26/15 2216)     Initial Impression / Assessment and Plan / ED Course  I have reviewed the triage vital signs and the nursing notes.  Pertinent labs & imaging results that were available during my care of the patient were reviewed by me and considered in my medical decision making (see chart for details).  Clinical Course   I spoke with the [facility] hospitalist about admission for possible early sepsis and UTI.  I have given a plan to the patient's son.  He voiced an understanding and all questions were answered Final Clinical Impressions(s) / ED Diagnoses   Final diagnoses:  None    New Prescriptions Current Discharge Medication List       Charlestine Night, PA-C 11/27/15 0052    Mancel Bale, MD 11/27/15 2123

## 2015-11-27 NOTE — Progress Notes (Signed)
Patient ID: [name], female   DOB: 1916-12-19, 82 y.o.   MRN: [id number]    PROGRESS NOTE    NAJMA BOZARTH  SPQ:[id number] DOB: Jul 15, 1916 DOA: 11/26/2015  PCP: [name], MD   Brief Narrative:  82 y.o. female with medical history significant of A.Fib, not on anticoagulation due to prior AVM bleed, DM diet controlled.  Patient presented to the ED with c/o Lethargy and AMS.  Suspected sepsis per facility who sent her in. She had recent admit last month for PNA.  Per facility WBC elevated.  Assessment & Plan:   1. A.Fib RVR - 1. cardizem gtt for rate control started in ED but pt soon became hypotensive and drip stopped 2. Will have to resume today as HR now up in 120 - 130's 3. 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 Lysbeth Penner 10/18/12 office note). 4. H/o hypothyroidism but TSH last checked 8 days ago and was high so synthroid was increased, needs recheck in another 3 weeks or so. 2. Sepsis due to UTI - unknown pathogen at this time  1. patient met criteria for sepsis with HR > 90, RR > 22, WBC 16 K 2. Started on vanc and zosyn, continue day #2 until we get more cultures back  3. Holding lasix for now  4. Plan to narrow down abx in next 24 - 48 hours  3. CKD stage 4 - chronic and baseline 4. Anemia of chronic disease, CKD - no signs of bleeding, CBC in AM 5. Acute encephalopathy, metabolic from sepsis, still lethargic, monitor in SDU and continue BAX as noted above, will need PT eval eventually when more alert  6. DM2 with complications of nephropathy - continue SSI for now but appears to have no meds at home  1. SSI  7. Chronic CHF - 1. watch for fluid overload with fluid resuscitation 2. Holding lasix for the moment but may need to resume this med soon depending how she does overnight   DVT prophylaxis: SCDs Code Status: DNR Family Communication: Son over the phone Consults called: none   Consultants:   None  Procedures:     None   Antimicrobials:   Vanc and Zosyn 10/03 -->   Subjective: Still lethargic this AM.   Objective: Vitals:   11/27/15 1200 11/27/15 1300 11/27/15 1400 11/27/15 1500  BP: 129/63 (!) 114/92 (!) 127/50 106/71  Pulse: (!) 135 91 (!) 101 (!) 132  Resp: 18 (!) 21 18 (!) 22  Temp: 97 F (36.1 C)     TempSrc: Axillary     SpO2: 94% 98% 99% 99%  Weight:      Height:        Intake/Output Summary (Last 24 hours) at 11/27/15 1653 Last data filed at 11/27/15 1400  Gross per 24 hour  Intake          7387.92 ml  Output             1200 ml  Net          6187.92 ml   Filed Weights   11/26/15 1745 11/26/15 2242  Weight: 71.7 kg (158 lb) 75.8 kg (167 lb 1.7 oz)    Examination:  General exam: Appears lethargic, difficult to awake  Respiratory system: diminished breath sounds at bases  Cardiovascular system: IRRR, No JVD, murmurs, rubs, gallops or clicks. No pedal edema. Gastrointestinal system: Abdomen is nondistended, soft and nontender. No organomegaly or masses felt.  Central  nervous system: lethargic, moving all 4 extremities spon  Data Reviewed: I have personally reviewed following labs and imaging studies  CBC:  Recent Labs Lab 11/21/15 0547 11/26/15 1741 11/27/15 0430  WBC 10.2 16.4* 14.9*  NEUTROABS  --  12.7*  --   HGB 10.6* 10.1* 9.0*  HCT 34.0* 31.4* 28.7*  MCV 98.6 96.0 100.7*  PLT 266 352 703   Basic Metabolic Panel:  Recent Labs Lab 11/21/15 0547 11/26/15 1741 11/27/15 0430  NA 143 138 139  K 4.1 4.3 3.8  CL 108 103 111  CO2 21* 26 22  GLUCOSE 173* 229* 188*  BUN 34* 40* 34*  CREATININE 2.15* 2.60* 1.94*  CALCIUM 10.4* 10.1 8.4*   Liver Function Tests:  Recent Labs Lab 11/26/15 1741  AST 17  ALT 13*  ALKPHOS 79  BILITOT 0.7  PROT 6.7  ALBUMIN 3.1*   CBG:  Recent Labs Lab 11/26/15 1635 11/26/15 2302 11/27/15 0732 11/27/15 1159 11/27/15 1544  GLUCAP 260* 180* 183* 200* 160*   Urine analysis:    Component Value  Date/Time   COLORURINE YELLOW 11/26/2015 2013   APPEARANCEUR CLOUDY (A) 11/26/2015 2013   APPEARANCEUR Cloudy (A) 08/29/2014 1428   LABSPEC 1.015 11/26/2015 2013   PHURINE 5.0 11/26/2015 2013   [place] NEGATIVE 11/26/2015 2013   HGBUR NEGATIVE 11/26/2015 2013   BILIRUBINUR NEGATIVE 11/26/2015 2013   BILIRUBINUR negative 12/13/2014 1217   BILIRUBINUR Negative 08/29/2014 [place] 11/26/2015 2013   PROTEINUR NEGATIVE 11/26/2015 2013   UROBILINOGEN negative 12/13/2014 1217   UROBILINOGEN 0.2 07/12/2014 1251   NITRITE NEGATIVE 11/26/2015 2013   LEUKOCYTESUR LARGE (A) 11/26/2015 2013   LEUKOCYTESUR 3+ (A) 08/29/2014 1428    Recent Results (from the past 240 hour(s))  Blood Culture (routine x 2)     Status: None (Preliminary result)   Collection Time: 11/26/15  5:41 PM  Result Value Ref Range Status   Specimen Description BLOOD RIGHT ANTECUBITAL  Final   Special Requests IN PEDIATRIC BOTTLE 3CC  Final   Culture   Final    NO GROWTH < 24 HOURS Performed at [facility]    Report Status PENDING  Incomplete  MRSA PCR Screening     Status: None   Collection Time: 11/26/15 10:55 PM  Result Value Ref Range Status   MRSA by PCR NEGATIVE NEGATIVE Final    Comment:        The GeneXpert MRSA Assay (FDA approved for NASAL specimens only), is one component of a comprehensive MRSA colonization surveillance program. It is not intended to diagnose MRSA infection nor to guide or monitor treatment for MRSA infections.       Radiology Studies: Dg Chest Port 1 View  Result Date: 11/26/2015 CLINICAL DATA:  Sepsis.  Altered mental status today. EXAM: PORTABLE CHEST 1 VIEW COMPARISON:  PA and lateral chest 11/18/2015 and 10/07/2012. Single view of the chest 11/13/2015. FINDINGS: Interstitial edema seen on the most recent examination has resolved. Mild left basilar atelectasis is noted. The lungs are otherwise clear. Heart size is upper normal. Aortic atherosclerosis is  seen. The patient is status post CABG. IMPRESSION: Subsegmental atelectasis left lung base. Interstitial edema on the most recent examination has resolved. Atherosclerosis. Electronically Signed   By: Inge Rise M.D.   On: 11/26/2015 18:22      Scheduled Meds: . aspirin EC  81 mg Oral Daily  . atorvastatin  20 mg Oral q1800  . calcitonin (salmon)  1 spray Alternating Nares  Daily  . darifenacin  15 mg Oral Daily  . insulin aspart  0-9 Units Subcutaneous TID WC  . ipratropium-albuterol  3 mL Nebulization TID  . latanoprost  1 drop Left Eye QHS  . levothyroxine  150 mcg Oral QAC breakfast  . lip balm      . pantoprazole  40 mg Oral Daily  . piperacillin-tazobactam (ZOSYN)  IV  2.25 g Intravenous Q8H  . polyethylene glycol  17 g Oral Daily  . saccharomyces boulardii  250 mg Oral BID  . [START ON 11/28/2015] vancomycin  1,000 mg Intravenous Q48H   Continuous Infusions: . diltiazem (CARDIZEM) infusion Stopped (11/27/15 0004)  . phenylephrine (NEO-SYNEPHRINE) Adult infusion 20 mcg/min (11/27/15 0658)     LOS: 1 day    Time spent: 20 minutes    Faye Ramsay, MD [facility] Pager [phone]  If 7PM-7AM, please contact night-coverage [email or web address] Password [email or web address] 11/27/2015, 4:53 PM

## 2015-11-27 NOTE — Significant Event (Signed)
Patient not tolerating cardizem gtt, pressure dropped to 70s.  Gtt stopped, pressure now back up to 90s systolic but HR 120s-130s.  Putting patient on continuous NS at 125 cc/hr to treat as sepsis with repeat labs in AM.  Holding off on further rate control meds at the moment as patient remains asymptomatic from cardiac standpoint (no CP, SOB, palpitations, etc).

## 2015-11-27 NOTE — Progress Notes (Signed)
CSW following to assist with d/c planning. Pt was recently hospitalized at [facility] ( 11/13/15 - 11/21/15 ) and d/c to [facility] on 11/21/15 for ST Rehab. Please see psychosocial assessment dated 11/16/15. Pt was hospitalized at [facility] from [facility] on 11/26/15 with atrial fibrillation with RVR. CSW met with pt to assist with d/c planning. Pt reports that she would like to return to [facility] at d/c. CSW has contacted SNF and clinicals provided. [place] will readmit pt once stable for d/c. SNF confirms that pt's son contacted them and would like pt to return at d/c. CSW will continue to follow to assist with d/c planning to SNF.  Werner Lean LCSW [phone]

## 2015-11-27 NOTE — Care Management Note (Signed)
Case Management Note  Patient Details  Name: [name] MRN: [id number] Date of Birth: 1917-01-29  Subjective/Objective:     A.fib and hypotension with iv cardizem and iv levophed                Action/Plan: Patient is from home with hha and family  Expected Discharge Date:   (unknown)               Expected Discharge Plan:  Home w Home Health Services  In-House Referral:  Clinical Social Work  Discharge planning Services  CM Consult  Post Acute Care Choice:    Choice offered to:     DME Arranged:    DME Agency:     HH Arranged:    HH Agency:     Status of Service:     If discussed at [employer] Meetings, dates discussed:    Additional Comments:  Golda AcreDavis, Rhonda Lynn, RN 11/27/2015, 10:45 AM

## 2015-11-27 NOTE — Progress Notes (Signed)
Cardizem gtt started at 5mg  at 2340.  0000 BP 79/38.  Cardizem paused, [facility] provider informed.  Orders received to stop Cardizem and increase fluids to 125. BP currently 91/52 and HR 99. Will continue to monitor.

## 2015-11-27 NOTE — Significant Event (Signed)
Patient re-evaluated at bedside.  BP continued to drop despite cardizem being stopped and 5.5L total IVF resuscitation.  Now with AMS:  A: Patient is clearly having septic shock at this point.  P: 1) spoke with son and confirmed DNI/DNR status, he is on way in to hospital 2) patient already with 5.5L total IVF resuscitation (30 ml/kg is 2.5L in this patient). 3) Spoke with Dr. Molli KnockYacoub, he recommended no further IVF 4) Dr. Molli KnockYacoub recommended neosynephrine at a max of 50 mcg via Peripheral IV.  Given status does not recommend central line or further escalation of care.

## 2015-11-28 ENCOUNTER — Inpatient Hospital Stay (HOSPITAL_COMMUNITY): Payer: Medicare Other

## 2015-11-28 DIAGNOSIS — I10 Essential (primary) hypertension: Secondary | ICD-10-CM

## 2015-11-28 DIAGNOSIS — J9801 Acute bronchospasm: Secondary | ICD-10-CM

## 2015-11-28 DIAGNOSIS — A419 Sepsis, unspecified organism: Principal | ICD-10-CM

## 2015-11-28 DIAGNOSIS — J9621 Acute and chronic respiratory failure with hypoxia: Secondary | ICD-10-CM

## 2015-11-28 DIAGNOSIS — I5033 Acute on chronic diastolic (congestive) heart failure: Secondary | ICD-10-CM

## 2015-11-28 LAB — CBC
HEMATOCRIT: 27.6 % — AB (ref 36.0–46.0)
Hemoglobin: 8.8 g/dL — ABNORMAL LOW (ref 12.0–15.0)
MCH: 31.7 pg (ref 26.0–34.0)
MCHC: 31.9 g/dL (ref 30.0–36.0)
MCV: 99.3 fL (ref 78.0–100.0)
PLATELETS: 287 10*3/uL (ref 150–400)
RBC: 2.78 MIL/uL — ABNORMAL LOW (ref 3.87–5.11)
RDW: 15 % (ref 11.5–15.5)
WBC: 8.4 10*3/uL (ref 4.0–10.5)

## 2015-11-28 LAB — BASIC METABOLIC PANEL
ANION GAP: 8 (ref 5–15)
BUN: 26 mg/dL — ABNORMAL HIGH (ref 6–20)
CALCIUM: 9.5 mg/dL (ref 8.9–10.3)
CO2: 21 mmol/L — AB (ref 22–32)
CREATININE: 1.94 mg/dL — AB (ref 0.44–1.00)
Chloride: 114 mmol/L — ABNORMAL HIGH (ref 101–111)
GFR, EST AFRICAN AMERICAN: 24 mL/min — AB (ref >60)
GFR, EST NON AFRICAN AMERICAN: 21 mL/min — AB (ref >60)
GLUCOSE: 193 mg/dL — AB (ref 65–99)
Potassium: 4 mmol/L (ref 3.5–5.1)
Sodium: 143 mmol/L (ref 135–145)

## 2015-11-28 LAB — GLUCOSE, CAPILLARY
Glucose-Capillary: 209 mg/dL — ABNORMAL HIGH (ref 65–99)
Glucose-Capillary: 228 mg/dL — ABNORMAL HIGH (ref 65–99)
Glucose-Capillary: 200 mg/dL — ABNORMAL HIGH (ref 65–99)
Glucose-Capillary: 148 mg/dL — ABNORMAL HIGH (ref 65–99)

## 2015-11-28 LAB — URINE CULTURE: Culture: NO GROWTH

## 2015-11-28 MED ORDER — HYDRALAZINE HCL 20 MG/ML IJ SOLN
10.0000 mg | Freq: Three times a day (TID) | INTRAMUSCULAR | Status: DC | PRN
  Administered 2015-11-28: 10 mg via INTRAVENOUS
  Filled 2015-11-28: qty 1

## 2015-11-28 MED ORDER — BUDESONIDE 0.25 MG/2ML IN SUSP
0.2500 mg | Freq: Two times a day (BID) | RESPIRATORY_TRACT | Status: DC
  Administered 2015-11-28 – 2015-12-02 (×9): 0.25 mg via RESPIRATORY_TRACT
  Filled 2015-11-28 (×10): qty 2

## 2015-11-28 MED ORDER — LORAZEPAM 2 MG/ML IJ SOLN
0.5000 mg | Freq: Three times a day (TID) | INTRAMUSCULAR | Status: DC | PRN
  Administered 2015-12-01 – 2015-12-03 (×2): 0.5 mg via INTRAVENOUS
  Filled 2015-11-28 (×2): qty 1

## 2015-11-28 MED ORDER — FUROSEMIDE 10 MG/ML IJ SOLN
40.0000 mg | Freq: Once | INTRAMUSCULAR | Status: AC
  Administered 2015-11-28: 40 mg via INTRAVENOUS
  Filled 2015-11-28: qty 4

## 2015-11-28 MED ORDER — DILTIAZEM HCL 30 MG PO TABS
15.0000 mg | ORAL_TABLET | Freq: Three times a day (TID) | ORAL | Status: DC
  Administered 2015-11-28 (×2): 15 mg via ORAL
  Filled 2015-11-28 (×2): qty 1

## 2015-11-28 NOTE — Progress Notes (Signed)
Family at bedside request not to do breathing tx while pt was resting. No distress noted at this time

## 2015-11-28 NOTE — Progress Notes (Signed)
Patient ID: [name], female   DOB: 04-04-1916, 82 y.o.   MRN: [id number]    PROGRESS NOTE    [name]  YOV:[id number] DOB: 1916/06/25 DOA: 11/26/2015  PCP: [name], MD   Brief Narrative:  82 y.o. female with medical history significant of A.Fib, not on anticoagulation due to prior AVM bleed, DM diet controlled.  Patient presented to the ED with c/o Lethargy and AMS.  Suspected sepsis per facility who sent her in. She had recent admit last month for PNA.    Assessment & Plan:   1. A.Fib RVR - 1. cardizem gtt for rate control started in ED but pt soon became hypotensive and drip stopped 2. Now HR is better, but still irregular and with intermittent episodes of HR up to 120's 3. 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 Lysbeth Penner 10/18/12 office note). 4. Will avoid B-blockers given ongoing wheezing and bronchospasm; will use low dose oral cardizem 2. Sepsis due to UTI - unknown pathogen at this time  1. patient met criteria for sepsis with HR > 90, RR > 22, WBC 16 K 2. Started on vanc and zosyn, total abx's day #3;  3. Will narrow to zosyn only; follow cx data 3. Acute resp failure: with hypoxia and tachypnea. With concerns for CHF exacerbation and bronchospasm. Will give lasix, check CXR and follow daily weight and low sodium diet. Patient started on pulmicort and will continue Duoneb 4. Acute on Chronic diastolic CHF - as a consequence from fluid resuscitation. 1. Will watch weight on daily basis and follow low sodium diet 2. Will give lasix IV and follow volume status 3. Continue oxygen supplementation  5. CKD stage 4 - chronic and baseline; will monitor closely especially with IV lasix now 6. Anemia of chronic disease, CKD - no signs of bleeding, will follow Hgb trend 7. Acute encephalopathy, toxic/metabolic from sepsis: due to infection; will continue antibiotics and follow clinical response. Patient is more alert and following  commands today. 8. DM2 with complications of nephropathy - appears to have no meds at home  1. Continue SSI  9. HTN: will use low dose Cardizem and PRN hydralazine  10. Hypothyroidism: will continue synthroid recently adjusted.      DVT prophylaxis: SCDs Code Status: DNR Family Communication: Son at bedside  Consults called: none   Consultants:   None  Procedures:   See below for x-ray reports   Antimicrobials:   Zosyn 10/02 -->  Vancomycin -->10/02-->>10/4   Subjective: AAOX2; complaining of difficulty breathing, with tachypnea and having difficulty speaking in full sentences.  Objective: Vitals:   11/28/15 0843 11/28/15 0900 11/28/15 1000 11/28/15 1200  BP:  (!) 152/83 (!) 191/91 (!) 183/105  Pulse:  (!) 106 98 (!) 101  Resp:  (!) 23 (!) 27 (!) 29  Temp:    (!) 96.9 F (36.1 C)  TempSrc:    Axillary  SpO2: 98% 96% 93% 97%  Weight:      Height:        Intake/Output Summary (Last 24 hours) at 11/28/15 1224 Last data filed at 11/28/15 1200  Gross per 24 hour  Intake           1545.5 ml  Output             3075 ml  Net          -1529.5 ml   Filed Weights   11/26/15 1745 11/26/15 2242  Weight:  71.7 kg (158 lb) 75.8 kg (167 lb 1.7 oz)    Examination:  General exam: in mild distress secondary to SOB and increased wheezing. Patient with difficulty speaking in full sentences.   Respiratory system: positive exp wheezing, diminished breath sounds at bases/mild crackles; no using accessory muscles. Positive tachypnea   Cardiovascular system: IRRR, No murmurs, rubs, gallops or clicks appreciated. Positive  pedal edema bilaterally. Gastrointestinal system: Abdomen is nondistended, soft and nontender. Positive BS Central nervous system: moving all 4 extremities spontaneously; no focal deficit, able to follow commands.  Data Reviewed: I have personally reviewed following labs and imaging studies  CBC:  Recent Labs Lab 11/26/15 1741 11/27/15 0430  11/28/15 0318  WBC 16.4* 14.9* 8.4  NEUTROABS 12.7*  --   --   HGB 10.1* 9.0* 8.8*  HCT 31.4* 28.7* 27.6*  MCV 96.0 100.7* 99.3  PLT 352 365 160   Basic Metabolic Panel:  Recent Labs Lab 11/26/15 1741 11/27/15 0430 11/28/15 0318  NA 138 139 143  K 4.3 3.8 4.0  CL 103 111 114*  CO2 26 22 21*  GLUCOSE 229* 188* 193*  BUN 40* 34* 26*  CREATININE 2.60* 1.94* 1.94*  CALCIUM 10.1 8.4* 9.5   Liver Function Tests:  Recent Labs Lab 11/26/15 1741  AST 17  ALT 13*  ALKPHOS 79  BILITOT 0.7  PROT 6.7  ALBUMIN 3.1*   CBG:  Recent Labs Lab 11/27/15 1159 11/27/15 1544 11/27/15 2127 11/28/15 0730 11/28/15 1202  GLUCAP 200* 160* 159* 209* 228*   Urine analysis:    Component Value Date/Time   COLORURINE YELLOW 11/26/2015 2013   APPEARANCEUR CLOUDY (A) 11/26/2015 2013   APPEARANCEUR Cloudy (A) 08/29/2014 1428   LABSPEC 1.015 11/26/2015 2013   PHURINE 5.0 11/26/2015 2013   GLUCOSEU NEGATIVE 11/26/2015 2013   HGBUR NEGATIVE 11/26/2015 2013   BILIRUBINUR NEGATIVE 11/26/2015 2013   BILIRUBINUR negative 12/13/2014 1217   BILIRUBINUR Negative 08/29/2014 [place] 11/26/2015 2013   PROTEINUR NEGATIVE 11/26/2015 2013   UROBILINOGEN negative 12/13/2014 1217   UROBILINOGEN 0.2 07/12/2014 1251   NITRITE NEGATIVE 11/26/2015 2013   LEUKOCYTESUR LARGE (A) 11/26/2015 2013   LEUKOCYTESUR 3+ (A) 08/29/2014 1428    Recent Results (from the past 240 hour(s))  Blood Culture (routine x 2)     Status: None (Preliminary result)   Collection Time: 11/26/15  5:41 PM  Result Value Ref Range Status   Specimen Description BLOOD RIGHT ANTECUBITAL  Final   Special Requests IN PEDIATRIC BOTTLE 3CC  Final   Culture   Final    NO GROWTH < 24 HOURS Performed at [facility]    Report Status PENDING  Incomplete  Urine culture     Status: None   Collection Time: 11/26/15  8:13 PM  Result Value Ref Range Status   Specimen Description URINE, RANDOM  Final   Special  Requests NONE  Final   Culture NO GROWTH Performed at [facility]   Final   Report Status 11/28/2015 FINAL  Final  MRSA PCR Screening     Status: None   Collection Time: 11/26/15 10:55 PM  Result Value Ref Range Status   MRSA by PCR NEGATIVE NEGATIVE Final    Comment:        The GeneXpert MRSA Assay (FDA approved for NASAL specimens only), is one component of a comprehensive MRSA colonization surveillance program. It is not intended to diagnose MRSA infection nor to guide or monitor treatment for MRSA infections.  Radiology Studies: Dg Chest Port 1 View  Result Date: 11/26/2015 CLINICAL DATA:  Sepsis.  Altered mental status today. EXAM: PORTABLE CHEST 1 VIEW COMPARISON:  PA and lateral chest 11/18/2015 and 10/07/2012. Single view of the chest 11/13/2015. FINDINGS: Interstitial edema seen on the most recent examination has resolved. Mild left basilar atelectasis is noted. The lungs are otherwise clear. Heart size is upper normal. Aortic atherosclerosis is seen. The patient is status post CABG. IMPRESSION: Subsegmental atelectasis left lung base. Interstitial edema on the most recent examination has resolved. Atherosclerosis. Electronically Signed   By: Inge Rise M.D.   On: 11/26/2015 18:22      Scheduled Meds: . aspirin EC  81 mg Oral Daily  . atorvastatin  20 mg Oral q1800  . budesonide (PULMICORT) nebulizer solution  0.25 mg Nebulization BID  . calcitonin (salmon)  1 spray Alternating Nares Daily  . darifenacin  15 mg Oral Daily  . diltiazem  15 mg Oral Q8H  . furosemide  40 mg Intravenous Once  . insulin aspart  0-9 Units Subcutaneous TID WC  . ipratropium-albuterol  3 mL Nebulization TID  . latanoprost  1 drop Left Eye QHS  . levothyroxine  150 mcg Oral QAC breakfast  . pantoprazole  40 mg Oral Daily  . piperacillin-tazobactam (ZOSYN)  IV  2.25 g Intravenous Q8H  . polyethylene glycol  17 g Oral Daily  . saccharomyces boulardii  250 mg Oral BID    Continuous Infusions:     LOS: 2 days    Time spent: 35 minutes (> 50% of the time dedicated to face to face examination, discussion with family members at bedside and coordinating care)   Barton Dubois, MD [facility] Pager [phone]  If 7PM-7AM, please contact night-coverage [email or web address] Password [email or web address] 11/28/2015, 12:24 PM

## 2015-11-29 DIAGNOSIS — R0602 Shortness of breath: Secondary | ICD-10-CM

## 2015-11-29 DIAGNOSIS — R4182 Altered mental status, unspecified: Secondary | ICD-10-CM

## 2015-11-29 DIAGNOSIS — I5043 Acute on chronic combined systolic (congestive) and diastolic (congestive) heart failure: Secondary | ICD-10-CM

## 2015-11-29 LAB — CBC
HEMATOCRIT: 28.5 % — AB (ref 36.0–46.0)
HEMOGLOBIN: 9 g/dL — AB (ref 12.0–15.0)
MCH: 31.1 pg (ref 26.0–34.0)
MCHC: 31.6 g/dL (ref 30.0–36.0)
MCV: 98.6 fL (ref 78.0–100.0)
Platelets: 316 10*3/uL (ref 150–400)
RBC: 2.89 MIL/uL — ABNORMAL LOW (ref 3.87–5.11)
RDW: 14.7 % (ref 11.5–15.5)
WBC: 9.1 10*3/uL (ref 4.0–10.5)

## 2015-11-29 LAB — BASIC METABOLIC PANEL
ANION GAP: 8 (ref 5–15)
BUN: 25 mg/dL — ABNORMAL HIGH (ref 6–20)
CALCIUM: 10.1 mg/dL (ref 8.9–10.3)
CHLORIDE: 111 mmol/L (ref 101–111)
CO2: 23 mmol/L (ref 22–32)
CREATININE: 1.95 mg/dL — AB (ref 0.44–1.00)
GFR calc non Af Amer: 20 mL/min — ABNORMAL LOW (ref >60)
GFR, EST AFRICAN AMERICAN: 24 mL/min — AB (ref >60)
Glucose, Bld: 136 mg/dL — ABNORMAL HIGH (ref 65–99)
Potassium: 3.6 mmol/L (ref 3.5–5.1)
SODIUM: 142 mmol/L (ref 135–145)

## 2015-11-29 LAB — GLUCOSE, CAPILLARY
GLUCOSE-CAPILLARY: 156 mg/dL — AB (ref 65–99)
Glucose-Capillary: 261 mg/dL — ABNORMAL HIGH (ref 65–99)
Glucose-Capillary: 207 mg/dL — ABNORMAL HIGH (ref 65–99)
Glucose-Capillary: 181 mg/dL — ABNORMAL HIGH (ref 65–99)

## 2015-11-29 MED ORDER — IPRATROPIUM-ALBUTEROL 0.5-2.5 (3) MG/3ML IN SOLN
3.0000 mL | Freq: Two times a day (BID) | RESPIRATORY_TRACT | Status: DC
  Administered 2015-11-29: 3 mL via RESPIRATORY_TRACT
  Filled 2015-11-29: qty 3

## 2015-11-29 MED ORDER — DILTIAZEM LOAD VIA INFUSION
10.0000 mg | Freq: Once | INTRAVENOUS | Status: DC
  Filled 2015-11-29: qty 10

## 2015-11-29 MED ORDER — METOPROLOL TARTRATE 5 MG/5ML IV SOLN
INTRAVENOUS | Status: AC
  Filled 2015-11-29: qty 5

## 2015-11-29 MED ORDER — DILTIAZEM HCL-DEXTROSE 100-5 MG/100ML-% IV SOLN (PREMIX): 5.0000 mg/h | INTRAVENOUS | Status: DC

## 2015-11-29 MED ORDER — FUROSEMIDE 20 MG PO TABS
20.0000 mg | ORAL_TABLET | Freq: Every day | ORAL | Status: DC
  Administered 2015-11-29 – 2015-12-03 (×5): 20 mg via ORAL
  Filled 2015-11-29 (×5): qty 1

## 2015-11-29 MED ORDER — DILTIAZEM HCL 30 MG PO TABS
15.0000 mg | ORAL_TABLET | Freq: Four times a day (QID) | ORAL | Status: DC
  Administered 2015-11-29: 15 mg via ORAL
  Filled 2015-11-29: qty 1

## 2015-11-29 MED ORDER — IPRATROPIUM-ALBUTEROL 0.5-2.5 (3) MG/3ML IN SOLN
3.0000 mL | Freq: Three times a day (TID) | RESPIRATORY_TRACT | Status: DC
  Administered 2015-11-30 – 2015-12-03 (×10): 3 mL via RESPIRATORY_TRACT
  Filled 2015-11-29 (×11): qty 3

## 2015-11-29 MED ORDER — METOPROLOL TARTRATE 5 MG/5ML IV SOLN
2.5000 mg | Freq: Four times a day (QID) | INTRAVENOUS | Status: DC | PRN
  Administered 2015-11-29 – 2015-12-02 (×5): 2.5 mg via INTRAVENOUS
  Filled 2015-11-29 (×5): qty 5

## 2015-11-29 MED ORDER — DILTIAZEM HCL 30 MG PO TABS
30.0000 mg | ORAL_TABLET | Freq: Four times a day (QID) | ORAL | Status: DC
  Administered 2015-11-29 – 2015-11-30 (×4): 30 mg via ORAL
  Filled 2015-11-29 (×4): qty 1

## 2015-11-29 NOTE — Progress Notes (Signed)
Patient ID: [name], female   DOB: 1916/12/18, 82 y.o.   MRN: [id number]    PROGRESS NOTE    [name]  VOZ:[id number] DOB: [birth year] DOA: 11/26/2015  PCP: [name], MD   Brief Narrative:  82 y.o. female with medical history significant of A.Fib, not on anticoagulation due to prior AVM bleed, DM diet controlled.  Patient presented to the ED with c/o Lethargy and AMS.  Suspected sepsis per facility who sent her in. She had recent admit last month for PNA.    Assessment & Plan:   1. A.Fib RVR - 1. cardizem gtt for rate control started in ED but pt soon became hypotensive and drip stopped 2. Now HR is better, but still irregular and with intermittent episodes of HR up to 120's overnight  3. 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 Lysbeth Penner 10/18/12 office note). 4. Will avoid B-blockers given ongoing wheezing and bronchospasm; will continue oral cardizem, dose adjusted to '30mg'$  QID 2. Sepsis due to UTI - unknown pathogen at this time  1. patient met criteria for sepsis with HR > 90, RR > 22, WBC 16 K 2. Started on vanc and zosyn, total abx's day #4;  3. Will continue zosyn; follow cx data 3. Acute resp failure: with hypoxia and tachypnea. With concerns for CHF exacerbation and bronchospasm. Excellent response to IV lasix. Will resume home PO diuretic. Continue daily weights and low sodium diet. Will continue pulmicort, continue Duoneb and start flutter use. 4. Acute on Chronic diastolic CHF - as a consequence from fluid resuscitation most likely. 1. Will watch weight on daily basis and follow low sodium diet 2. Will resume now daily PO lasix and follow strict I's and O's 3. Continue oxygen supplementation (weaned as tolerated) 5. CKD stage 4 - chronic and baseline; will monitor closely especially with need for diuresis 6. Anemia of chronic disease, CKD - no signs of bleeding, will follow Hgb trend 7. Acute encephalopathy, toxic/metabolic  from sepsis: due to infection; will continue antibiotics and follow clinical response. Patient is AAOX2, following commands properly and per son close to baseline. 8. DM2 with complications of nephropathy - appears to have no meds at home  1. Continue SSI  9. HTN: will use low dose Cardizem and PRN hydralazine  10. Hypothyroidism: will continue synthroid recently adjusted.     DVT prophylaxis: SCDs Code Status: DNR Family Communication: Son at bedside  Consults called: none   Consultants:   None  Procedures:   See below for x-ray reports   Antimicrobials:   Zosyn 10/02 -->  Vancomycin -->10/02-->>10/4   Subjective: AAOX2; breathing a lot better and in no distress.   Objective: Vitals:   11/29/15 1000 11/29/15 1100 11/29/15 1139 11/29/15 1200  BP: (!) 116/41 124/64  (!) 142/74  Pulse: 98 91  99  Resp: (!) 27 20  (!) 22  Temp:   98.9 F (37.2 C)   TempSrc:   Oral   SpO2: 95% 90%  92%  Weight:      Height:        Intake/Output Summary (Last 24 hours) at 11/29/15 1242 Last data filed at 11/29/15 1200  Gross per 24 hour  Intake              160 ml  Output             2100 ml  Net            -  1940 ml   Filed Weights   11/26/15 2242 11/28/15 1935 11/29/15 0500  Weight: 75.8 kg (167 lb 1.7 oz) 75.2 kg (165 lb 12.6 oz) 75.2 kg (165 lb 12.6 oz)    Examination:  General exam: breathing much better and in no distress currently. Patient remains afebrile and demonstrated improvement in her appetite. Patient with significant improvement in her ability to speak in full sentences.  Respiratory system: No frank exp wheezing, still with diminished breath sounds at bases/minimal crackles; no using accessory muscles.  Cardiovascular system: IRRR, No murmurs, rubs, gallops or clicks appreciated. Positive  pedal edema bilaterally. Gastrointestinal system: Abdomen is nondistended, soft and nontender. Positive BS Central nervous system: moving all 4 extremities spontaneously;  no focal deficit, able to follow commands properly .  Data Reviewed: I have personally reviewed following labs and imaging studies  CBC:  Recent Labs Lab 11/26/15 1741 11/27/15 0430 11/28/15 0318 11/29/15 0330  WBC 16.4* 14.9* 8.4 9.1  NEUTROABS 12.7*  --   --   --   HGB 10.1* 9.0* 8.8* 9.0*  HCT 31.4* 28.7* 27.6* 28.5*  MCV 96.0 100.7* 99.3 98.6  PLT 352 365 287 316   Basic Metabolic Panel:  Recent Labs Lab 11/26/15 1741 11/27/15 0430 11/28/15 0318 11/29/15 0330  NA 138 139 143 142  K 4.3 3.8 4.0 3.6  CL 103 111 114* 111  CO2 26 22 21* 23  GLUCOSE 229* 188* 193* 136*  BUN 40* 34* 26* 25*  CREATININE 2.60* 1.94* 1.94* 1.95*  CALCIUM 10.1 8.4* 9.5 10.1   Liver Function Tests:  Recent Labs Lab 11/26/15 1741  AST 17  ALT 13*  ALKPHOS 79  BILITOT 0.7  PROT 6.7  ALBUMIN 3.1*   CBG:  Recent Labs Lab 11/28/15 1202 11/28/15 1616 11/28/15 2133 11/29/15 0739 11/29/15 1134  GLUCAP 228* 200* 148* 156* 261*   Urine analysis:    Component Value Date/Time   COLORURINE YELLOW 11/26/2015 2013   APPEARANCEUR CLOUDY (A) 11/26/2015 2013   APPEARANCEUR Cloudy (A) 08/29/2014 1428   LABSPEC 1.015 11/26/2015 2013   PHURINE 5.0 11/26/2015 2013   GLUCOSEU NEGATIVE 11/26/2015 2013   HGBUR NEGATIVE 11/26/2015 2013   BILIRUBINUR NEGATIVE 11/26/2015 2013   BILIRUBINUR negative 12/13/2014 1217   BILIRUBINUR Negative 08/29/2014 1428   KETONESUR NEGATIVE 11/26/2015 2013   PROTEINUR NEGATIVE 11/26/2015 2013   UROBILINOGEN negative 12/13/2014 1217   UROBILINOGEN 0.2 07/12/2014 1251   NITRITE NEGATIVE 11/26/2015 2013   LEUKOCYTESUR LARGE (A) 11/26/2015 2013   LEUKOCYTESUR 3+ (A) 08/29/2014 1428    Recent Results (from the past 240 hour(s))  Blood Culture (routine x 2)     Status: None (Preliminary result)   Collection Time: 11/26/15  5:41 PM  Result Value Ref Range Status   Specimen Description BLOOD RIGHT ANTECUBITAL  Final   Special Requests IN PEDIATRIC BOTTLE 3CC   Final   Culture   Final    NO GROWTH 2 DAYS Performed at [facility]    Report Status PENDING  Incomplete  Urine culture     Status: None   Collection Time: 11/26/15  8:13 PM  Result Value Ref Range Status   Specimen Description URINE, RANDOM  Final   Special Requests NONE  Final   Culture NO GROWTH Performed at [facility]   Final   Report Status 11/28/2015 FINAL  Final  MRSA PCR Screening     Status: None   Collection Time: 11/26/15 10:55 PM  Result Value Ref Range  Status   MRSA by PCR NEGATIVE NEGATIVE Final    Comment:        The GeneXpert MRSA Assay (FDA approved for NASAL specimens only), is one component of a comprehensive MRSA colonization surveillance program. It is not intended to diagnose MRSA infection nor to guide or monitor treatment for MRSA infections.   Blood Culture (routine x 2)     Status: None (Preliminary result)   Collection Time: 11/27/15  1:51 AM  Result Value Ref Range Status   Specimen Description BLOOD RIGHT HAND  Final   Special Requests IN PEDIATRIC BOTTLE 2ML  Final   Culture   Final    NO GROWTH 1 DAY Performed at [facility]    Report Status PENDING  Incomplete      Radiology Studies: Dg Chest 1 View  Result Date: 11/28/2015 CLINICAL DATA:  Shortness of Breath.  Atrial fib. EXAM: CHEST 1 VIEW COMPARISON:  11/26/2015 FINDINGS: Prior CABG. Heart is borderline in size. Increasing interstitial disease throughout the lungs, likely interstitial edema. Small bilateral pleural effusions with bibasilar atelectasis. IMPRESSION: Mild interstitial edema with small effusions and bibasilar atelectasis. Electronically Signed   By: Rolm Baptise M.D.   On: 11/28/2015 13:00      Scheduled Meds: . aspirin EC  81 mg Oral Daily  . atorvastatin  20 mg Oral q1800  . budesonide (PULMICORT) nebulizer solution  0.25 mg Nebulization BID  . calcitonin (salmon)  1 spray Alternating Nares Daily  . darifenacin  15 mg Oral Daily  .  diltiazem  30 mg Oral Q6H  . furosemide  20 mg Oral Daily  . insulin aspart  0-9 Units Subcutaneous TID WC  . ipratropium-albuterol  3 mL Nebulization BID  . latanoprost  1 drop Left Eye QHS  . levothyroxine  150 mcg Oral QAC breakfast  . pantoprazole  40 mg Oral Daily  . piperacillin-tazobactam (ZOSYN)  IV  2.25 g Intravenous Q8H  . polyethylene glycol  17 g Oral Daily  . saccharomyces boulardii  250 mg Oral BID   Continuous Infusions:     LOS: 3 days    Time spent: 25 minutes (> 50% of the time dedicated to face to face examination, discussion with family members at bedside and coordinating care)   Barton Dubois, MD [facility] Pager [phone]  If 7PM-7AM, please contact night-coverage [email or web address] Password [email or web address] 11/29/2015, 12:42 PM

## 2015-11-29 NOTE — Progress Notes (Signed)
PT demonstrated verbal and hands on understanding of Flutter device- PT son also present for re-instruction and encouragement.

## 2015-11-29 NOTE — [place] FL2 (Signed)
[place] MEDICAID FL2 LEVEL OF CARE SCREENING TOOL     IDENTIFICATION  Patient Name: [name] Birthdate: May 11, 1916 Sex: female Admission Date (Current Location): 11/26/2015  [facility] and [place] Number:  Producer, television/film/video and Address:  [facility],  [address] [place], [place] [address]      Provider Number: [id number]  Attending Physician Name and Address:  Vassie Loll, MD  Relative Name and Phone Number:  [name], son, [phone]    Current Level of Care: Hospital Recommended Level of Care: Skilled Nursing Facility Prior Approval Number:    Date Approved/Denied:   PASRR Number: [id number]  Discharge Plan: SNF    Current Diagnoses: Patient Active Problem List   Diagnosis Date Noted  . SOB (shortness of breath)   . Atrial fibrillation with RVR (HCC) 11/26/2015  . Acute on chronic combined systolic and diastolic CHF (congestive heart failure) (HCC) 11/26/2015  . Severe sepsis (HCC)   . Aspiration pneumonia of right lower lobe due to vomit (HCC)   . Acute renal failure superimposed on stage 4 chronic kidney disease (HCC)   . Controlled diabetes mellitus type 2 with complications (HCC)   . Sepsis (HCC) 11/13/2015  . CAP (community acquired pneumonia) 11/13/2015  . Diabetes mellitus with complication (HCC)   . Calcaneal bursitis (heel) 09/11/2015  . S/P CABG (coronary artery bypass graft) 04/11/2015  . Leg pain, inferior 04/03/2015  . Pain in heel 04/03/2015  . CKD stage 4 due to type 2 diabetes mellitus (HCC) 04/25/2014  . Diabetic nephropathy associated with type 2 diabetes mellitus (HCC) 04/25/2014  . Contusion of foot 01/17/2014  . Diabetic neuropathy (HCC) 09/14/2013  . Pain in joint, ankle and foot 05/11/2013  . Weakness 10/07/2012  . AKI (acute kidney injury) (HCC) 10/07/2012  . GI AVM (gastrointestinal arteriovenous vascular malformation) 08/17/2012  . Gastritis and gastroduodenitis 08/17/2012  . GI bleed 08/14/2012  . HTN  (hypertension) 08/14/2012  . Dementia 08/14/2012  . Altered mental status 08/14/2012  . Lower urinary tract infectious disease 08/14/2012  . Anemia   . Hypothyroid   . Hyperlipidemia   . Hyperpotassemia   . Paroxysmal atrial fibrillation (HCC)   . Hereditary and idiopathic peripheral neuropathy   . DM (diabetes mellitus), type 2 with renal complications (HCC)   . Hypercalcemia   . Cough 05/06/2011    Orientation RESPIRATION BLADDER Height & Weight     Self, Situation, Place  O2 Incontinent, Indwelling catheter Weight: 168 lb 3.4 oz (76.3 kg) Height:  5\' 2"  (157.5 cm)  BEHAVIORAL SYMPTOMS/MOOD NEUROLOGICAL BOWEL NUTRITION STATUS  Other (Comment) (no behaviors)   Continent Diet  AMBULATORY STATUS COMMUNICATION OF NEEDS Skin   Extensive Assist Verbally Normal                       Personal Care Assistance Level of Assistance  Bathing, Feeding, Dressing Bathing Assistance: Maximum assistance Feeding assistance: Limited assistance Dressing Assistance: Maximum assistance     Functional Limitations Info  Sight, Hearing, Speech Sight Info: Adequate Hearing Info: Adequate Speech Info: Adequate    SPECIAL CARE FACTORS FREQUENCY  PT (By licensed PT), OT (By licensed OT)     PT Frequency: 5x wk OT Frequency: 5x wk            Contractures Contractures Info: Not present    Additional Factors Info  Code Status, Allergies Code Status Info: DNR Allergies Info: cosopt Dorzolamide Hcl-timolol Mal, Noroxin Norfloxacin, Sulfa Antibiotics, Prozac Fluoxetine Hcl  Insulin Sliding Scale Info: insulin aspart (novoLOG) injection 0-9 Units       Current Medications (11/29/2015):  This is the current hospital active medication list Current Facility-Administered Medications  Medication Dose Route Frequency Provider Last Rate Last Dose  . acetaminophen (TYLENOL) tablet 650 mg  650 mg Oral Q4H PRN Hillary BowJared M Gardner, DO   650 mg at 11/27/15 2105  . albuterol (PROVENTIL) (2.5  MG/3ML) 0.083% nebulizer solution 2.5 mg  2.5 mg Nebulization Q4H PRN Hillary BowJared M Gardner, DO   2.5 mg at 11/28/15 1159  . aspirin EC tablet 81 mg  81 mg Oral Daily Hillary BowJared M Gardner, DO   81 mg at 11/29/15 1058  . atorvastatin (LIPITOR) tablet 20 mg  20 mg Oral q1800 Hillary BowJared M Gardner, DO   20 mg at 11/28/15 1717  . budesonide (PULMICORT) nebulizer solution 0.25 mg  0.25 mg Nebulization BID Vassie Lollarlos Madera, MD   0.25 mg at 11/29/15 0957  . calcitonin (salmon) (MIACALCIN/FORTICAL) nasal spray 1 spray  1 spray Alternating Nares Daily Hillary BowJared M Gardner, DO   1 spray at 11/29/15 1100  . darifenacin (ENABLEX) 24 hr tablet 15 mg  15 mg Oral Daily Hillary BowJared M Gardner, DO   15 mg at 11/29/15 1057  . diltiazem (CARDIZEM) tablet 30 mg  30 mg Oral Q6H Vassie Lollarlos Madera, MD   30 mg at 11/29/15 1100  . furosemide (LASIX) tablet 20 mg  20 mg Oral Daily Vassie Lollarlos Madera, MD   20 mg at 11/29/15 1058  . hydrALAZINE (APRESOLINE) injection 10 mg  10 mg Intravenous Q8H PRN Vassie Lollarlos Madera, MD   10 mg at 11/28/15 1249  . insulin aspart (novoLOG) injection 0-9 Units  0-9 Units Subcutaneous TID WC Hillary BowJared M Gardner, DO   5 Units at 11/29/15 1229  . ipratropium-albuterol (DUONEB) 0.5-2.5 (3) MG/3ML nebulizer solution 3 mL  3 mL Nebulization BID Vassie Lollarlos Madera, MD      . latanoprost (XALATAN) 0.005 % ophthalmic solution 1 drop  1 drop Left Eye QHS Hillary BowJared M Gardner, DO   1 drop at 11/28/15 2215  . levothyroxine (SYNTHROID, LEVOTHROID) tablet 150 mcg  150 mcg Oral QAC breakfast Hillary BowJared M Gardner, DO   150 mcg at 11/29/15 0755  . LORazepam (ATIVAN) injection 0.5 mg  0.5 mg Intravenous Q8H PRN Vassie Lollarlos Madera, MD      . metoprolol (LOPRESSOR) injection 2.5 mg  2.5 mg Intravenous Q6H PRN Leda GauzeKaren J Kirby-Graham, NP   2.5 mg at 11/29/15 0755  . ondansetron (ZOFRAN) injection 4 mg  4 mg Intravenous Q6H PRN Hillary BowJared M Gardner, DO      . oxyCODONE (Oxy IR/ROXICODONE) immediate release tablet 5 mg  5 mg Oral Q6H PRN Hillary BowJared M Gardner, DO      . pantoprazole (PROTONIX) EC  tablet 40 mg  40 mg Oral Daily Hillary BowJared M Gardner, DO   40 mg at 11/29/15 1058  . piperacillin-tazobactam (ZOSYN) IVPB 2.25 g  2.25 g Intravenous Q8H Dorothea OgleIskra M Myers, MD   2.25 g at 11/29/15 1059  . polyethylene glycol (MIRALAX / GLYCOLAX) packet 17 g  17 g Oral Daily Hillary BowJared M Gardner, DO   17 g at 11/29/15 1059  . pregabalin (LYRICA) capsule 75 mg  75 mg Oral BID PRN Hillary BowJared M Gardner, DO   75 mg at 11/28/15 1059  . saccharomyces boulardii (FLORASTOR) capsule 250 mg  250 mg Oral BID Hillary BowJared M Gardner, DO   250 mg at 11/29/15 1058     Discharge Medications: Please see  discharge summary for a list of discharge medications.  Relevant Imaging Results:  Relevant Lab Results:   Additional Information SSN: [id number]  Phyliss Hulick, Dickey Gave, LCSW

## 2015-11-29 NOTE — Progress Notes (Signed)
Pharmacy Antibiotic Note  [name] is a 82 y.o. female admitted on 11/26/2015 with sepsis.  Pharmacy has been consulted for Vancomycin and Zosyn dosing. Vancomycin has since been stopped.   Today, 11/29/2015:  Day #4 antibiotics  WBC WNL  Off pressors  Afebrile  Cultures unrevealing  Plan:  Zosyn 2.25g IV q8h remains appropriate for renal function   Consider change to PO antibiotics (or IV ceftriaxone) to complete course   Height: 5\' 1"  (154.9 cm) Weight: 165 lb 12.6 oz (75.2 kg) IBW/kg (Calculated) : 47.8  Temp (24hrs), Avg:97.6 F (36.4 C), Min:96.9 F (36.1 C), Max:98.5 F (36.9 C)   Recent Labs Lab 11/26/15 1741 11/26/15 1840 11/27/15 0148 11/27/15 0430 11/28/15 0318 11/29/15 0330  WBC 16.4*  --   --  14.9* 8.4 9.1  CREATININE 2.60*  --   --  1.94* 1.94* 1.95*  LATICACIDVEN  --  2.15* 1.2 1.1  --   --     Estimated Creatinine Clearance: 15.3 mL/min (by C-G formula based on SCr of 1.95 mg/dL (H)).    Allergies  Allergen Reactions  . Cosopt [Dorzolamide Hcl-Timolol Mal] Other (See Comments)    Redness and burning in both eyes  . Noroxin [Norfloxacin] Swelling  . Sulfa Antibiotics Nausea And Vomiting  . Prozac [Fluoxetine Hcl] Rash    Antimicrobials this admission: 10/2 >> Vancomycin >> 10/2 >> Zosyn >>  Dose adjustments this admission: --  Microbiology results: 10/2 BCx: NGTD  (per notes, lab accidentally discarded initial specimens, so repeat blood cx done AFTER abx started) 10/2 UCx: NGF (U/A cloudy, +bacteria, WBC, leukocytes) 10/2: MRSA PCR negative 10/3: BCx: NGTD   Thank you for allowing pharmacy to be a part of this patient's care.  Juliette Alcideustin Rosalie Buenaventura, PharmD, BCPS.   Pager: [phone] 11/29/2015 8:58 AM

## 2015-11-29 NOTE — Progress Notes (Signed)
At approximately 0110 the patients HR went from regular in the 70's to atrial fibrillation and atrial flutter with bursts into the upper 140's. The patient was resting comfortably at the time and denied any symptoms including pain, pressure, shortness of breath, or confusion. Blood pressure was 118/46 (76), 2.5mg  of lopressor was given. Will continue to monitor.

## 2015-11-30 LAB — GLUCOSE, CAPILLARY
GLUCOSE-CAPILLARY: 244 mg/dL — AB (ref 65–99)
Glucose-Capillary: 196 mg/dL — ABNORMAL HIGH (ref 65–99)
Glucose-Capillary: 214 mg/dL — ABNORMAL HIGH (ref 65–99)
Glucose-Capillary: 162 mg/dL — ABNORMAL HIGH (ref 65–99)

## 2015-11-30 MED ORDER — CEFUROXIME AXETIL 500 MG PO TABS
500.0000 mg | ORAL_TABLET | Freq: Every day | ORAL | Status: DC
  Administered 2015-11-30 – 2015-12-03 (×4): 500 mg via ORAL
  Filled 2015-11-30 (×5): qty 1

## 2015-11-30 MED ORDER — DILTIAZEM HCL 60 MG PO TABS
60.0000 mg | ORAL_TABLET | Freq: Four times a day (QID) | ORAL | Status: DC
  Administered 2015-11-30 – 2015-12-02 (×8): 60 mg via ORAL
  Filled 2015-11-30 (×8): qty 1

## 2015-11-30 NOTE — Progress Notes (Signed)
Patient ID: [name], female   DOB: Jul 03, 1916, 82 y.o.   MRN: [id number]    PROGRESS NOTE    [name]  UYQ:[id number] DOB: 15-Feb-1917 DOA: 11/26/2015  PCP: [name], MD   Brief Narrative:  82 y.o. female with medical history significant of A.Fib, not on anticoagulation due to prior AVM bleed, DM diet controlled.  Patient presented to the ED with c/o Lethargy and AMS.  Suspected sepsis per facility who sent her in. She had recent admit last month for PNA.    Assessment & Plan:   1. A.Fib RVR - 1. cardizem gtt for rate control started in ED but pt soon became hypotensive and drip stopped 2. Now HR is better, but still irregular and with intermittent episodes of HR up to 120's overnight  3. 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 Lysbeth Penner 10/18/12 office note). 4. Will avoid B-blockers given ongoing wheezing and bronchospasm down the road; will continue oral cardizem, dose adjusted to '60mg'$  QID 2. Sepsis due to UTI - unknown pathogen at this time  1. patient met criteria for sepsis with HR > 90, RR > 22, WBC 16 K 2. Started on vanc and zosyn, total abx's day #5; plan is to treat for 10 days. 3. No growth isolated. Will narrow further to ceftin now and complete abx therapy. 4. Will discontinue foley 3. Acute resp failure: with hypoxia and tachypnea. With concerns for CHF exacerbation and bronchospasm. Excellent response to IV lasix. Will resume home PO diuretic. Continue daily weights and low sodium diet. Will continue pulmicort, continue Duoneb and start flutter use. 4. Acute on Chronic diastolic CHF - as a consequence from fluid resuscitation most likely. 1. Will watch weight on daily basis and follow low sodium diet 2. Will resume now daily PO lasix and follow strict I's and O's 3. Continue oxygen supplementation (weaned as tolerated) 5. CKD stage 4 - chronic and baseline; will monitor closely especially with need for  diuresis 6. Anemia of chronic disease, CKD - no signs of bleeding, will follow Hgb trend 7. Acute encephalopathy, toxic/metabolic from sepsis: due to infection; will continue antibiotics and follow clinical response. Patient is AAOX2, following commands properly and per son close to baseline. 8. DM2 with complications of nephropathy - appears to have no meds at home  1. Continue SSI  9. HTN: will continue use of Cardizem and PRN hydralazine  10. Hypothyroidism: will continue synthroid recently adjusted.     DVT prophylaxis: SCDs Code Status: DNR Family Communication: Son at bedside  Disposition: will d/c foley, adjust medications for better control of her A. Fib; transition antibiotics to PO. Consults called: none   Consultants:   None  Procedures:   See below for x-ray reports   Antimicrobials:   Zosyn 10/02 -->>>10/06  Vancomycin -->10/02-->>10/4  ceftin 10/06   Subjective: AAOX2; breathing a lot better and in no distress. Denies CP. Overnight experienced transient episode of A. Fib with HR sustained in 140-150.  Objective: Vitals:   11/29/15 2049 11/30/15 0600 11/30/15 0825 11/30/15 1213  BP: 118/73 130/86  134/85  Pulse: (!) 127 84  100  Resp: 16 18    Temp: 98.2 F (36.8 C) 97.9 F (36.6 C)    TempSrc: Oral Oral    SpO2: 96% 92% 95%   Weight:  75.5 kg (166 lb 7.2 oz)    Height:        Intake/Output Summary (Last 24 hours) at  11/30/15 1325 Last data filed at 11/30/15 0831  Gross per 24 hour  Intake              410 ml  Output             2090 ml  Net            -1680 ml   Filed Weights   11/29/15 0500 11/29/15 1402 11/30/15 0600  Weight: 75.2 kg (165 lb 12.6 oz) 76.3 kg (168 lb 3.4 oz) 75.5 kg (166 lb 7.2 oz)    Examination: General exam: breathing significantly better and appears in no longer distress. Patient remains afebrile and demonstrated improvement in her appetite. Patient with significant improvement in her ability to speak in full  sentences and overall interaction.Marland Kitchen  Respiratory system: No frank exp wheezing, still with diminished breath sounds at bases/very minimal crackles; no using accessory muscles.  Cardiovascular system: IRRR, No murmurs, rubs, gallops or clicks appreciated. Positive  pedal edema bilaterally. Gastrointestinal system: Abdomen is nondistended, soft and nontender. Positive BS Central nervous system: moving all 4 extremities spontaneously; no focal deficit, able to follow commands properly .  Data Reviewed: I have personally reviewed following labs and imaging studies  CBC:  Recent Labs Lab 11/26/15 1741 11/27/15 0430 11/28/15 0318 11/29/15 0330  WBC 16.4* 14.9* 8.4 9.1  NEUTROABS 12.7*  --   --   --   HGB 10.1* 9.0* 8.8* 9.0*  HCT 31.4* 28.7* 27.6* 28.5*  MCV 96.0 100.7* 99.3 98.6  PLT 352 365 287 300   Basic Metabolic Panel:  Recent Labs Lab 11/26/15 1741 11/27/15 0430 11/28/15 0318 11/29/15 0330  NA 138 139 143 142  K 4.3 3.8 4.0 3.6  CL 103 111 114* 111  CO2 26 22 21* 23  GLUCOSE 229* 188* 193* 136*  BUN 40* 34* 26* 25*  CREATININE 2.60* 1.94* 1.94* 1.95*  CALCIUM 10.1 8.4* 9.5 10.1   Liver Function Tests:  Recent Labs Lab 11/26/15 1741  AST 17  ALT 13*  ALKPHOS 79  BILITOT 0.7  PROT 6.7  ALBUMIN 3.1*   CBG:  Recent Labs Lab 11/29/15 1134 11/29/15 1748 11/29/15 2112 11/30/15 0724 11/30/15 1158  GLUCAP 261* 207* 181* 196* 244*   Urine analysis:    Component Value Date/Time   COLORURINE YELLOW 11/26/2015 2013   APPEARANCEUR CLOUDY (A) 11/26/2015 2013   APPEARANCEUR Cloudy (A) 08/29/2014 1428   LABSPEC 1.015 11/26/2015 2013   PHURINE 5.0 11/26/2015 2013   GLUCOSEU NEGATIVE 11/26/2015 2013   HGBUR NEGATIVE 11/26/2015 2013   [place] NEGATIVE 11/26/2015 2013   BILIRUBINUR negative 12/13/2014 1217   BILIRUBINUR Negative 08/29/2014 [place] 11/26/2015 2013   PROTEINUR NEGATIVE 11/26/2015 2013   UROBILINOGEN negative 12/13/2014  1217   UROBILINOGEN 0.2 07/12/2014 1251   NITRITE NEGATIVE 11/26/2015 2013   LEUKOCYTESUR LARGE (A) 11/26/2015 2013   LEUKOCYTESUR 3+ (A) 08/29/2014 1428    Recent Results (from the past 240 hour(s))  Blood Culture (routine x 2)     Status: None (Preliminary result)   Collection Time: 11/26/15  5:41 PM  Result Value Ref Range Status   Specimen Description BLOOD RIGHT ANTECUBITAL  Final   Special Requests IN PEDIATRIC BOTTLE 3CC  Final   Culture   Final    NO GROWTH 3 DAYS Performed at [facility]    Report Status PENDING  Incomplete  Urine culture     Status: None   Collection Time: 11/26/15  8:13 PM  Result Value Ref Range Status   Specimen Description URINE, RANDOM  Final   Special Requests NONE  Final   Culture NO GROWTH Performed at [facility]   Final   Report Status 11/28/2015 FINAL  Final  MRSA PCR Screening     Status: None   Collection Time: 11/26/15 10:55 PM  Result Value Ref Range Status   MRSA by PCR NEGATIVE NEGATIVE Final    Comment:        The GeneXpert MRSA Assay (FDA approved for NASAL specimens only), is one component of a comprehensive MRSA colonization surveillance program. It is not intended to diagnose MRSA infection nor to guide or monitor treatment for MRSA infections.   Blood Culture (routine x 2)     Status: None (Preliminary result)   Collection Time: 11/27/15  1:51 AM  Result Value Ref Range Status   Specimen Description BLOOD RIGHT HAND  Final   Special Requests IN PEDIATRIC BOTTLE 2ML  Final   Culture   Final    NO GROWTH 2 DAYS Performed at [facility]    Report Status PENDING  Incomplete      Radiology Studies: No results found.    Scheduled Meds: . aspirin EC  81 mg Oral Daily  . atorvastatin  20 mg Oral q1800  . budesonide (PULMICORT) nebulizer solution  0.25 mg Nebulization BID  . calcitonin (salmon)  1 spray Alternating Nares Daily  . cefUROXime  500 mg Oral Q breakfast  . darifenacin  15 mg  Oral Daily  . diltiazem  60 mg Oral Q6H  . furosemide  20 mg Oral Daily  . insulin aspart  0-9 Units Subcutaneous TID WC  . ipratropium-albuterol  3 mL Nebulization TID  . latanoprost  1 drop Left Eye QHS  . levothyroxine  150 mcg Oral QAC breakfast  . pantoprazole  40 mg Oral Daily  . polyethylene glycol  17 g Oral Daily  . saccharomyces boulardii  250 mg Oral BID   Continuous Infusions:     LOS: 4 days    Time spent: 25 minutes (> 50% of the time dedicated to face to face examination, discussion with family members at bedside and coordinating care)   Barton Dubois, MD [facility] Pager [phone]  If 7PM-7AM, please contact night-coverage [email or web address] Password [email or web address] 11/30/2015, 1:25 PM

## 2015-11-30 NOTE — Progress Notes (Signed)
CSW following for return to [facility]. CS[facility] that they would be able to take patient over the weekend if ready.   Please call weekend CSW (ph#: [phone]) to facilitate discharge.      Lincoln MaxinKelly Leatrice Parilla, LCSW [facility] Clinical Social Worker cell #: [phone]    Lincoln MaxinKelly Kyon Bentler, LCSW [facility] Clinical Social Worker cell #: [phone]

## 2015-12-01 LAB — BASIC METABOLIC PANEL
Anion gap: 11 (ref 5–15)
BUN: 21 mg/dL — AB (ref 6–20)
CALCIUM: 10.3 mg/dL (ref 8.9–10.3)
CO2: 24 mmol/L (ref 22–32)
CREATININE: 1.92 mg/dL — AB (ref 0.44–1.00)
Chloride: 105 mmol/L (ref 101–111)
GFR calc Af Amer: 24 mL/min — ABNORMAL LOW (ref >60)
GFR, EST NON AFRICAN AMERICAN: 21 mL/min — AB (ref >60)
GLUCOSE: 198 mg/dL — AB (ref 65–99)
Potassium: 3.5 mmol/L (ref 3.5–5.1)
Sodium: 140 mmol/L (ref 135–145)

## 2015-12-01 LAB — CBC WITH DIFFERENTIAL/PLATELET
BASOS ABS: 0.1 10*3/uL (ref 0.0–0.1)
BASOS PCT: 1 %
EOS PCT: 2 %
Eosinophils Absolute: 0.2 10*3/uL (ref 0.0–0.7)
HCT: 31.4 % — ABNORMAL LOW (ref 36.0–46.0)
Hemoglobin: 10.3 g/dL — ABNORMAL LOW (ref 12.0–15.0)
LYMPHS PCT: 21 %
Lymphs Abs: 2.1 10*3/uL (ref 0.7–4.0)
MCH: 30.7 pg (ref 26.0–34.0)
MCHC: 32.8 g/dL (ref 30.0–36.0)
MCV: 93.5 fL (ref 78.0–100.0)
MONO ABS: 1 10*3/uL (ref 0.1–1.0)
Monocytes Relative: 10 %
Neutro Abs: 6.7 10*3/uL (ref 1.7–7.7)
Neutrophils Relative %: 66 %
PLATELETS: 347 10*3/uL (ref 150–400)
RBC: 3.36 MIL/uL — ABNORMAL LOW (ref 3.87–5.11)
RDW: 14.3 % (ref 11.5–15.5)
WBC: 10 10*3/uL (ref 4.0–10.5)

## 2015-12-01 LAB — GLUCOSE, CAPILLARY
GLUCOSE-CAPILLARY: 177 mg/dL — AB (ref 65–99)
GLUCOSE-CAPILLARY: 186 mg/dL — AB (ref 65–99)
Glucose-Capillary: 179 mg/dL — ABNORMAL HIGH (ref 65–99)
Glucose-Capillary: 195 mg/dL — ABNORMAL HIGH (ref 65–99)

## 2015-12-01 LAB — CULTURE, BLOOD (ROUTINE X 2): Culture: NO GROWTH

## 2015-12-01 MED ORDER — METOPROLOL TARTRATE 5 MG/5ML IV SOLN
2.5000 mg | Freq: Once | INTRAVENOUS | Status: AC
  Administered 2015-12-01: 2.5 mg via INTRAVENOUS
  Filled 2015-12-01: qty 5

## 2015-12-01 MED ORDER — DM-GUAIFENESIN ER 30-600 MG PO TB12
1.0000 | ORAL_TABLET | Freq: Two times a day (BID) | ORAL | Status: DC | PRN
  Administered 2015-12-01 – 2015-12-02 (×2): 1 via ORAL
  Filled 2015-12-01 (×2): qty 1

## 2015-12-01 MED ORDER — DILTIAZEM HCL 25 MG/5ML IV SOLN
10.0000 mg | Freq: Once | INTRAVENOUS | Status: AC
  Administered 2015-12-01: 10 mg via INTRAVENOUS
  Filled 2015-12-01: qty 5

## 2015-12-01 MED ORDER — AMIODARONE IV BOLUS ONLY 150 MG/100ML
150.0000 mg | Freq: Once | INTRAVENOUS | Status: AC
  Administered 2015-12-01: 150 mg via INTRAVENOUS
  Filled 2015-12-01: qty 100

## 2015-12-01 MED ORDER — AMIODARONE HCL 200 MG PO TABS
400.0000 mg | ORAL_TABLET | Freq: Two times a day (BID) | ORAL | Status: DC
  Administered 2015-12-01 – 2015-12-03 (×5): 400 mg via ORAL
  Filled 2015-12-01 (×5): qty 2

## 2015-12-01 NOTE — Progress Notes (Addendum)
Pt. Converted back to Afib with HR of 140-150, experiencing SOB. O2 sats 90-93% on 2L O2. NP made aware. Will continue to monitor.

## 2015-12-01 NOTE — Progress Notes (Signed)
PT Cancellation Note  Patient Details Name: Tammy QuamWinifred D [name] MRN: [id number] DOB: 1916-12-15   Cancelled Treatment:     Pt remains in afib.  Pt with recent admission 11/13/15 and was discharged to SNF.  Plans per chart to return to SNF.   Please call weekend pager: [phone], if PT evaluation needed imminently.   Earnstine Meinders,KATHrine E 12/01/2015, 1:34 PM Zenovia JarredKati Jacqulyne Gladue, PT, DPT 12/01/2015 Pager: [phone]

## 2015-12-01 NOTE — Progress Notes (Signed)
Patient converted to NSR, HR - 74, MD notified.  Earnest ConroyBrooke M. Clelia CroftShaw, RN

## 2015-12-01 NOTE — Progress Notes (Signed)
Pt. Was having SOB at 0500. O2 sats at 87% on room air. Nasal cannula placed on pt. With 2L O2, Sats went up to 94%. Will continue to monitor.

## 2015-12-01 NOTE — Progress Notes (Signed)
Patient ID: [name], female   DOB: September 17, 1916, 82 y.o.   MRN: [id number]    PROGRESS NOTE    [name]  LDJ:[id number] DOB: 11-21-1916 DOA: 11/26/2015  PCP: [name], MD   Brief Narrative:  82 y.o. female with medical history significant of A.Fib, not on anticoagulation due to prior AVM bleed, DM diet controlled.  Patient presented to the ED with c/o Lethargy and AMS.  Suspected sepsis per facility who sent her in. She had recent admit last month for PNA.    Assessment & Plan:   1. A.Fib RVR - 1. cardizem gtt for rate control started in ED but pt soon became hypotensive and drip stopped 2. Now HR is better, but still irregular and with intermittent episodes of HR up to 120's overnight  3. 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 Lysbeth Penner 10/18/12 office note). 4. Will avoid B-blockers given ongoing wheezing and bronchospasm down the road. After discussing with cardiology, will continue oral cardizem, dose adjusted to 61m QID and will start her on amiodarone (1564mloading dose given and started on 40085mID) 2. Sepsis due to UTI - unknown pathogen at this time  1. patient met criteria for sepsis with HR > 90, RR > 22, WBC 16 K 2. Started on vanc and zosyn, total abx's day #5; plan is to treat for 10 days. 3. No growth isolated. Will narrow further to ceftin now and complete abx therapy by motuh. 4. Will discontinue foley 3. Acute resp failure: with hypoxia and tachypnea. With concerns for CHF exacerbation and bronchospasm. Excellent response to IV lasix. Will resume home PO diuretic. Continue daily weights and low sodium diet. Will continue pulmicort, continue Duoneb and start flutter use. 4. Acute on Chronic diastolic CHF - as a consequence from fluid resuscitation most likely. 1. Will watch weight on daily basis and follow low sodium diet 2. Will resume now daily PO lasix and follow strict I's and O's 3. Continue oxygen  supplementation (weaned as tolerated) 5. CKD stage 4 - chronic and baseline; will monitor closely especially with need for diuresis 6. Anemia of chronic disease, CKD - no signs of bleeding, will follow Hgb trend 7. Acute encephalopathy, toxic/metabolic from sepsis: due to infection; will continue antibiotics and follow clinical response. Patient is AAOX2, following commands properly and per son close to baseline. 8. DM2 with complications of nephropathy - appears to have no meds at home  1. Continue SSI  9. HTN: will continue use of Cardizem and PRN hydralazine  10. Hypothyroidism: will continue synthroid recently adjusted.     DVT prophylaxis: SCDs Code Status: DNR Family Communication: Son at bedside  Disposition: will adjust medications for better control of her A. Fib; continue PO antibiotics to PO and continue supportive care. Once A. Fib controlled will discharge to SNF for further care and rehab. Consults called: none   Consultants:   Discussed with Dr. NelMeda Coffeeardiology)  Procedures:   See below for x-ray reports   Antimicrobials:   Zosyn 10/02 -->>>10/06  Vancomycin -->10/02-->>10/4  ceftin 10/06   Subjective: AAOX2; breathing overall stable, even using low [place] supplementation this morning. Patient is in no distress, Denies CP and is afebrile. This morning with A. Fib and HR sustained in 140.  Objective: Vitals:   12/01/15 0730 12/01/15 0732 12/01/15 0836 12/01/15 1131  BP:   134/70 (!) 128/94  Pulse:   (!) 143   Resp:  Temp:      TempSrc:      SpO2: 90% 90% 93%   Weight:      Height:        Intake/Output Summary (Last 24 hours) at 12/01/15 1355 Last data filed at 12/01/15 0900  Gross per 24 hour  Intake              360 ml  Output             1600 ml  Net            -1240 ml   Filed Weights   11/29/15 1402 11/30/15 0600 12/01/15 0540  Weight: 76.3 kg (168 lb 3.4 oz) 75.5 kg (166 lb 7.2 oz) 74 kg (163 lb 2.3 oz)    Examination: General  exam: breathing ok, even has required some oxygen supplementation overnight. Denies CP. Currently with ongoing episodes of A. Fib with RVR. Patient has remained afebrile and demonstrated improvement in her appetite. AAOX2 (baseline as per son reports)  Respiratory system: No frank exp wheezing, still with diminished breath sounds at bases and with minimal crackles; no using accessory muscles.  Cardiovascular system: IRRR, No murmurs, rubs, gallops or clicks appreciated. Positive  pedal edema bilaterally. Gastrointestinal system: Abdomen is nondistended, soft and nontender. Positive BS Central nervous system: moving all 4 extremities spontaneously; no focal deficit, able to follow commands properly .  Data Reviewed: I have personally reviewed following labs and imaging studies  CBC:  Recent Labs Lab 11/26/15 1741 11/27/15 0430 11/28/15 0318 11/29/15 0330 12/01/15 0825  WBC 16.4* 14.9* 8.4 9.1 10.0  NEUTROABS 12.7*  --   --   --  6.7  HGB 10.1* 9.0* 8.8* 9.0* 10.3*  HCT 31.4* 28.7* 27.6* 28.5* 31.4*  MCV 96.0 100.7* 99.3 98.6 93.5  PLT 352 365 287 316 169   Basic Metabolic Panel:  Recent Labs Lab 11/26/15 1741 11/27/15 0430 11/28/15 0318 11/29/15 0330 12/01/15 0825  NA 138 139 143 142 140  K 4.3 3.8 4.0 3.6 3.5  CL 103 111 114* 111 105  CO2 26 22 21* 23 24  GLUCOSE 229* 188* 193* 136* 198*  BUN 40* 34* 26* 25* 21*  CREATININE 2.60* 1.94* 1.94* 1.95* 1.92*  CALCIUM 10.1 8.4* 9.5 10.1 10.3   Liver Function Tests:  Recent Labs Lab 11/26/15 1741  AST 17  ALT 13*  ALKPHOS 79  BILITOT 0.7  PROT 6.7  ALBUMIN 3.1*   CBG:  Recent Labs Lab 11/30/15 0724 11/30/15 1158 11/30/15 1653 11/30/15 2215 12/01/15 0734  GLUCAP 196* 244* 214* 162* 177*   Urine analysis:    Component Value Date/Time   COLORURINE YELLOW 11/26/2015 2013   APPEARANCEUR CLOUDY (A) 11/26/2015 2013   APPEARANCEUR Cloudy (A) 08/29/2014 1428   LABSPEC 1.015 11/26/2015 2013   PHURINE 5.0  11/26/2015 2013   GLUCOSEU NEGATIVE 11/26/2015 2013   HGBUR NEGATIVE 11/26/2015 2013   BILIRUBINUR NEGATIVE 11/26/2015 2013   BILIRUBINUR negative 12/13/2014 1217   BILIRUBINUR Negative 08/29/2014 [place] 11/26/2015 2013   PROTEINUR NEGATIVE 11/26/2015 2013   UROBILINOGEN negative 12/13/2014 1217   UROBILINOGEN 0.2 07/12/2014 1251   NITRITE NEGATIVE 11/26/2015 2013   LEUKOCYTESUR LARGE (A) 11/26/2015 2013   LEUKOCYTESUR 3+ (A) 08/29/2014 1428    Recent Results (from the past 240 hour(s))  Blood Culture (routine x 2)     Status: None (Preliminary result)   Collection Time: 11/26/15  5:41 PM  Result Value Ref Range Status  Specimen Description BLOOD RIGHT ANTECUBITAL  Final   Special Requests IN PEDIATRIC BOTTLE 3CC  Final   Culture   Final    NO GROWTH 4 DAYS Performed at [facility]    Report Status PENDING  Incomplete  Urine culture     Status: None   Collection Time: 11/26/15  8:13 PM  Result Value Ref Range Status   Specimen Description URINE, RANDOM  Final   Special Requests NONE  Final   Culture NO GROWTH Performed at [facility]   Final   Report Status 11/28/2015 FINAL  Final  MRSA PCR Screening     Status: None   Collection Time: 11/26/15 10:55 PM  Result Value Ref Range Status   MRSA by PCR NEGATIVE NEGATIVE Final    Comment:        The GeneXpert MRSA Assay (FDA approved for NASAL specimens only), is one component of a comprehensive MRSA colonization surveillance program. It is not intended to diagnose MRSA infection nor to guide or monitor treatment for MRSA infections.   Blood Culture (routine x 2)     Status: None (Preliminary result)   Collection Time: 11/27/15  1:51 AM  Result Value Ref Range Status   Specimen Description BLOOD RIGHT HAND  Final   Special Requests IN PEDIATRIC BOTTLE 2ML  Final   Culture   Final    NO GROWTH 3 DAYS Performed at [facility]    Report Status PENDING  Incomplete       Radiology Studies: No results found.    Scheduled Meds: . amiodarone  400 mg Oral BID  . aspirin EC  81 mg Oral Daily  . atorvastatin  20 mg Oral q1800  . budesonide (PULMICORT) nebulizer solution  0.25 mg Nebulization BID  . calcitonin (salmon)  1 spray Alternating Nares Daily  . cefUROXime  500 mg Oral Q breakfast  . darifenacin  15 mg Oral Daily  . diltiazem  60 mg Oral Q6H  . furosemide  20 mg Oral Daily  . insulin aspart  0-9 Units Subcutaneous TID WC  . ipratropium-albuterol  3 mL Nebulization TID  . latanoprost  1 drop Left Eye QHS  . levothyroxine  150 mcg Oral QAC breakfast  . pantoprazole  40 mg Oral Daily  . polyethylene glycol  17 g Oral Daily  . saccharomyces boulardii  250 mg Oral BID   Continuous Infusions:     LOS: 5 days    Time spent: 30 minutes (> 50% of the time dedicated to face to face examination, discussion with family members at bedside and coordinating care)   Barton Dubois, MD [facility] Pager [phone]  If 7PM-7AM, please contact night-coverage [email or web address] Password [email or web address] 12/01/2015, 1:55 PM

## 2015-12-02 DIAGNOSIS — I480 Paroxysmal atrial fibrillation: Secondary | ICD-10-CM

## 2015-12-02 DIAGNOSIS — I4891 Unspecified atrial fibrillation: Secondary | ICD-10-CM

## 2015-12-02 LAB — GLUCOSE, CAPILLARY
GLUCOSE-CAPILLARY: 154 mg/dL — AB (ref 65–99)
Glucose-Capillary: 167 mg/dL — ABNORMAL HIGH (ref 65–99)
Glucose-Capillary: 192 mg/dL — ABNORMAL HIGH (ref 65–99)

## 2015-12-02 LAB — CULTURE, BLOOD (ROUTINE X 2): Culture: NO GROWTH

## 2015-12-02 MED ORDER — DILTIAZEM HCL ER COATED BEADS 180 MG PO CP24
300.0000 mg | ORAL_CAPSULE | Freq: Every day | ORAL | Status: DC
  Administered 2015-12-02 – 2015-12-03 (×2): 300 mg via ORAL
  Filled 2015-12-02 (×2): qty 1

## 2015-12-02 NOTE — Progress Notes (Signed)
Patient converted into NSR.

## 2015-12-02 NOTE — Progress Notes (Signed)
Patient ID: [name], female   DOB: Feb 07, 1917, 82 y.o.   MRN: [id number]    PROGRESS NOTE    [name]  OMB:[id number] DOB: Dec 02, 1916 DOA: 11/26/2015  PCP: [name], MD   Brief Narrative:  82 y.o. female with medical history significant of A.Fib, not on anticoagulation due to prior AVM bleed, DM diet controlled.  Patient presented to the ED with c/o Lethargy and AMS.  Suspected sepsis per facility who sent her in. She had recent admit last month for PNA.    Assessment & Plan:   1. A.Fib RVR - 1. cardizem gtt for rate control started in ED but pt soon became hypotensive and drip stopped 2. Now HR is better, but still irregular and with intermittent episodes of HR up to 120's overnight  3. 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 Lysbeth Penner 10/18/12 office note). 4. Will avoid B-blockers given ongoing wheezing and bronchospasm down the road. After discussing with cardiology, will continue oral cardizem, now transitioned to extended release form '300mg'$  daily. Per cardiology rec's will also continue amiodarone, now '200mg'$  BID and  With plans to adjust further to '200mg'$  daily in 3 weeks (when she follow up with [name]). 2. Sepsis due to UTI - unknown pathogen at this time  1. patient met criteria for sepsis with HR > 90, RR > 22, WBC 16 K 2. Started on vanc and zosyn, total abx's day #5; plan is to treat for 10 days. 3. No growth isolated. Will narrow further to ceftin now and complete abx therapy by motuh. 4. Will discontinue foley 3. Acute resp failure: with hypoxia and tachypnea. With concerns for CHF exacerbation and bronchospasm. Excellent response to IV lasix. Will resume home PO diuretic. Continue daily weights and low sodium diet. Will continue pulmicort, continue Duoneb and start flutter use. 4. Acute on Chronic diastolic CHF - as a consequence from fluid resuscitation most likely. 1. Will watch weight on daily basis and follow low  sodium diet 2. Will resume now daily PO lasix and follow strict I's and O's 3. Continue oxygen supplementation (weaned as tolerated) 5. CKD stage 4 - chronic and baseline; will monitor closely especially with need for diuresis 6. Anemia of chronic disease, CKD - no signs of bleeding, will follow Hgb trend 7. Acute encephalopathy, toxic/metabolic from sepsis: due to infection; will continue antibiotics and follow clinical response. Patient is AAOX2, following commands properly and per son close to baseline. 8. DM2 with complications of nephropathy - appears to have no meds at home  1. Continue SSI  9. HTN: will continue use of Cardizem and PRN hydralazine  10. Hypothyroidism: will continue synthroid recently adjusted.     DVT prophylaxis: SCDs Code Status: DNR Family Communication: Son at bedside  Disposition: will adjust medications for better control of her A. Fib; continue PO antibiotics to PO and continue supportive care. Once A. Fib controlled will discharge to SNF for further care and rehab; most likely in 24-48 hours.  Consultants:   Discussed with Dr. Meda Coffee (cardiology)  Procedures:   See below for x-ray reports   Antimicrobials:   Zosyn 10/02 -->>>10/06  Vancomycin -->10/02-->>10/4  ceftin 10/06   Subjective: AAOX2; breathing overall stable, tolerating oxygen to be weaned. Denies CP and orthopnea. Patient's HR is controlled now and back into sinus rhythm   Objective: Vitals:   12/02/15 0033 12/02/15 0210 12/02/15 0447 12/02/15 0846  BP:  (!) 116/58 (!) 120/47  Pulse: 100 100 63 100  Resp:   20 18  Temp:   98.1 F (36.7 C)   TempSrc:   Oral   SpO2:  96% 95% 94%  Weight:   76.3 kg (168 lb 3.4 oz)   Height:        Intake/Output Summary (Last 24 hours) at 12/02/15 1431 Last data filed at 12/02/15 0900  Gross per 24 hour  Intake              480 ml  Output                0 ml  Net              480 ml   Filed Weights   11/30/15 0600 12/01/15 0540  12/02/15 0447  Weight: 75.5 kg (166 lb 7.2 oz) 74 kg (163 lb 2.3 oz) 76.3 kg (168 lb 3.4 oz)    Examination: General exam: breathing ok, no CP, palpitations or acute complaints. Tolerating having her oxygen weaned off. A. Fib with Rate controlled now. Patient has remained afebrile and demonstrated improvement in her appetite. AAOX2 (baseline as per family reports)  Respiratory system: No frank exp wheezing, still with diminished breath sounds at bases but no frank crackles on exam; no using accessory muscles.  Cardiovascular system: rate controlled, No murmurs, rubs, gallops or clicks appreciated. Positive  pedal edema bilaterally. Gastrointestinal system: Abdomen is nondistended, soft and nontender. Positive BS Central nervous system: moving all 4 extremities spontaneously; no focal deficit, able to follow commands properly .  Data Reviewed: I have personally reviewed following labs and imaging studies  CBC:  Recent Labs Lab 11/26/15 1741 11/27/15 0430 11/28/15 0318 11/29/15 0330 12/01/15 0825  WBC 16.4* 14.9* 8.4 9.1 10.0  NEUTROABS 12.7*  --   --   --  6.7  HGB 10.1* 9.0* 8.8* 9.0* 10.3*  HCT 31.4* 28.7* 27.6* 28.5* 31.4*  MCV 96.0 100.7* 99.3 98.6 93.5  PLT 352 365 287 316 329   Basic Metabolic Panel:  Recent Labs Lab 11/26/15 1741 11/27/15 0430 11/28/15 0318 11/29/15 0330 12/01/15 0825  NA 138 139 143 142 140  K 4.3 3.8 4.0 3.6 3.5  CL 103 111 114* 111 105  CO2 26 22 21* 23 24  GLUCOSE 229* 188* 193* 136* 198*  BUN 40* 34* 26* 25* 21*  CREATININE 2.60* 1.94* 1.94* 1.95* 1.92*  CALCIUM 10.1 8.4* 9.5 10.1 10.3   Liver Function Tests:  Recent Labs Lab 11/26/15 1741  AST 17  ALT 13*  ALKPHOS 79  BILITOT 0.7  PROT 6.7  ALBUMIN 3.1*   CBG:  Recent Labs Lab 12/01/15 1138 12/01/15 1644 12/01/15 2246 12/02/15 0804 12/02/15 1143  GLUCAP 179* 195* 186* 167* 192*   Urine analysis:    Component Value Date/Time   COLORURINE YELLOW 11/26/2015 2013    APPEARANCEUR CLOUDY (A) 11/26/2015 2013   APPEARANCEUR Cloudy (A) 08/29/2014 1428   LABSPEC 1.015 11/26/2015 2013   PHURINE 5.0 11/26/2015 2013   GLUCOSEU NEGATIVE 11/26/2015 2013   HGBUR NEGATIVE 11/26/2015 2013   [place] NEGATIVE 11/26/2015 2013   BILIRUBINUR negative 12/13/2014 1217   BILIRUBINUR Negative 08/29/2014 [place] 11/26/2015 2013   PROTEINUR NEGATIVE 11/26/2015 2013   UROBILINOGEN negative 12/13/2014 1217   UROBILINOGEN 0.2 07/12/2014 1251   NITRITE NEGATIVE 11/26/2015 2013   LEUKOCYTESUR LARGE (A) 11/26/2015 2013   LEUKOCYTESUR 3+ (A) 08/29/2014 1428    Recent Results (from the past 240 hour(s))  Blood  Culture (routine x 2)     Status: None   Collection Time: 11/26/15  5:41 PM  Result Value Ref Range Status   Specimen Description BLOOD RIGHT ANTECUBITAL  Final   Special Requests IN PEDIATRIC BOTTLE 3CC  Final   Culture   Final    NO GROWTH 5 DAYS Performed at [facility]    Report Status 12/01/2015 FINAL  Final  Urine culture     Status: None   Collection Time: 11/26/15  8:13 PM  Result Value Ref Range Status   Specimen Description URINE, RANDOM  Final   Special Requests NONE  Final   Culture NO GROWTH Performed at [facility]   Final   Report Status 11/28/2015 FINAL  Final  MRSA PCR Screening     Status: None   Collection Time: 11/26/15 10:55 PM  Result Value Ref Range Status   MRSA by PCR NEGATIVE NEGATIVE Final    Comment:        The GeneXpert MRSA Assay (FDA approved for NASAL specimens only), is one component of a comprehensive MRSA colonization surveillance program. It is not intended to diagnose MRSA infection nor to guide or monitor treatment for MRSA infections.   Blood Culture (routine x 2)     Status: None (Preliminary result)   Collection Time: 11/27/15  1:51 AM  Result Value Ref Range Status   Specimen Description BLOOD RIGHT HAND  Final   Special Requests IN PEDIATRIC BOTTLE 2ML  Final    Culture   Final    NO GROWTH 4 DAYS Performed at [facility]    Report Status PENDING  Incomplete      Radiology Studies: No results found.    Scheduled Meds: . amiodarone  400 mg Oral BID  . aspirin EC  81 mg Oral Daily  . atorvastatin  20 mg Oral q1800  . budesonide (PULMICORT) nebulizer solution  0.25 mg Nebulization BID  . calcitonin (salmon)  1 spray Alternating Nares Daily  . cefUROXime  500 mg Oral Q breakfast  . darifenacin  15 mg Oral Daily  . diltiazem  300 mg Oral Daily  . furosemide  20 mg Oral Daily  . insulin aspart  0-9 Units Subcutaneous TID WC  . ipratropium-albuterol  3 mL Nebulization TID  . latanoprost  1 drop Left Eye QHS  . levothyroxine  150 mcg Oral QAC breakfast  . pantoprazole  40 mg Oral Daily  . polyethylene glycol  17 g Oral Daily  . saccharomyces boulardii  250 mg Oral BID   Continuous Infusions:     LOS: 6 days    Time spent: 25 minutes (> 50% of the time dedicated to face to face examination, discussion with family members at bedside and coordinating care)   Barton Dubois, MD [facility] Pager [phone]  If 7PM-7AM, please contact night-coverage [email or web address] Password [email or web address] 12/02/2015, 2:31 PM

## 2015-12-02 NOTE — Progress Notes (Addendum)
PT Cancellation Note  Patient Details Name: Juan QuamWinifred D Walgren MRN: [id number] DOB: [birth year]   Cancelled Treatment:    Reason Eval/Treat Not Completed: Patient not medically ready (has been in and out of Afib. will eval 12/03/15 if stable.  )   Rada HayHill, Deondray Ospina Elizabeth 12/02/2015, 7:28 AM [phone]

## 2015-12-02 NOTE — Consult Note (Signed)
Reason for Consult: atrial fib  Referring Physician: Dr. Bud Face Pharris is an 82 y.o. female.   HPI: The patient is a 82 yo woman with a h/o PAF, CAD, prior CAGB 1998, not an anti-coagulation candidate due to GI bleeding (AVM's) who was admitted with altered mental status thought due to a UTI. She has been treated with IV anti-biotics and is improving. She was started on IV amiodarone to help with rate control. The patient denies chest pain or sob. No edema. Her fevers have resolved.   PMH: Past Medical History:  Diagnosis Date  . Allergic rhinitis, cause unspecified   . Anemia   . Arthralgia of temporomandibular joint   . Arthropathy, unspecified, site unspecified   . Atrial fibrillation (HCC)   . Bronchitis, acute   . Carpal tunnel syndrome   . Chest pain, unspecified   . Chronic kidney disease, unspecified   . Closed dislocation, thoracic vertebra   . Closed fracture of lumbar vertebra without mention of spinal cord injury   . Diabetes type 2, uncontrolled (HCC)   . Diabetes with renal manifestations(250.4)   . Diastasis of muscle   . Eczema   . Gait disorder   . Hematuria, unspecified   . Hemoptysis   . History of nuclear stress test 06/06/2010   lexiscan; normal pattern of perfusion; low risk   . Hyperlipidemia   . Hyperpotassemia   . Hypertension   . Hypothyroid   . Insomnia, unspecified   . Macular degeneration (senile) of retina, unspecified   . Mild memory disturbance   . Myoclonus   . Myoclonus   . Obesity, unspecified   . Open wound of knee, leg (except thigh), and ankle, without mention of complication   . Osteoporosis   . Other atopic dermatitis and related conditions   . Other disorder of calcium metabolism   . Other disorder of calcium metabolism   . Other specified idiopathic peripheral neuropathy   . Personal history of fall   . Personality change due to conditions classified elsewhere   . Scoliosis   . Seborrheic keratosis   . Trigger  finger (acquired)   . Type II or unspecified type diabetes mellitus without mention of complication, not stated as uncontrolled   . Unspecified closed fracture of pelvis   . Unspecified constipation   . Unspecified glaucoma(365.9)   . Unspecified hereditary and idiopathic peripheral neuropathy   . Unspecified hypertrophic and atrophic condition of skin   . Unspecified pruritic disorder   . Unspecified urinary incontinence   . Unspecified vitamin D deficiency   . Urinary tract infection, site not specified   . Varicose veins of lower extremities with inflammation     PSHX: Past Surgical History:  Procedure Laterality Date  . ABDOMINAL HYSTERECTOMY     1973  . BIOPSY BREAST     2000  . CORONARY ARTERY BYPASS GRAFT     Dr Tyrone Sage 04/1996  . ESOPHAGOGASTRODUODENOSCOPY N/A 08/17/2012   Procedure: ESOPHAGOGASTRODUODENOSCOPY (EGD);  Surgeon: Hilarie Fredrickson, MD;  Location: [facility];  Service: Endoscopy;  Laterality: N/A;  . HOT HEMOSTASIS N/A 08/17/2012   Procedure: HOT HEMOSTASIS (ARGON PLASMA COAGULATION/BICAP);  Surgeon: Hilarie Fredrickson, MD;  Location: [facility];  Service: Endoscopy;  Laterality: N/A;  . KNEE ARTHROSCOPY     left Dr Thurston Hole   . LASER LAPAROSCOPY     right eye 04/2005  . LUMBAR SPINE SURGERY     Dr Newell Coral   . RIGHT OOPHORECTOMY  1950    FAMHX: Family History  Problem Relation Age of Onset  . Diabetes Mother   . Cancer Sister     colon  . Alzheimer's disease Sister   . Prostate cancer Son     Social History:  reports that she has never smoked. She has never used smokeless tobacco. She reports that she drinks alcohol. She reports that she does not use drugs.  Allergies:  Allergies  Allergen Reactions  . Cosopt [Dorzolamide Hcl-Timolol Mal] Other (See Comments)    Redness and burning in both eyes  . Noroxin [Norfloxacin] Swelling  . Sulfa Antibiotics Nausea And Vomiting  . Prozac [Fluoxetine Hcl] Rash    Medications: I have reviewed the  patient's current medications.  No results found.  ROS  As stated in the HPI and negative for all other systems.  Physical Exam  Vitals:Blood pressure (!) 120/47, pulse 100, temperature 98.1 F (36.7 C), temperature source Oral, resp. rate 18, height 5\' 2"  (1.575 m), weight 168 lb 3.4 oz (76.3 kg), SpO2 94 %.  elderly appearing 82 yo woman, NAD HEENT: Unremarkable Neck:  6 cm JVD, no thyromegally Lymphatics:  No adenopathy Back:  No CVA tenderness Lungs:  Clear with no wheezes, minimal basilar rales HEART:  Regular rate rhythm, no murmurs, no rubs, no clicks Abd:  soft, positive bowel sounds, no organomegally, no rebound, no guarding Ext:  2 plus pulses, no edema, no cyanosis, no clubbing Skin:  No rashes no nodules Neuro:  CN II through XII intact, motor grossly intact  Tele - nsr  Assessment/Plan: 1. PAF - At this point, would transition to oral amiodarone 200 mg bid, and then 200 mg a day in a month.  2. Diastolic heart failure - she appears nearly euvolemic.  3. UTI - she is improved on anti-biotics and her temperature is down. 4. Disp. - she can followup with Dr. Rennis GoldenHilty. At discharge ask her to see him back in 2-3 weeks. Her amio can be decreased at that time.   Sharlot GowdaGregg TaylorMD 12/02/2015, 9:10 AM

## 2015-12-02 NOTE — Progress Notes (Signed)
At beginning of this RN's shift, patient's HR was in the 130's a.fib. PRN lopressor was given to patient and within 30 minutes, patient's HR came down into the 90's-100's. On call MD was notified and new orders were given to give another dose of lopressor 2.5 IV. Patient's HR came down into the 80's-90's. Will continue to monitor.

## 2015-12-02 NOTE — Progress Notes (Signed)
Patient converted to a.fib. HR 95-115. MD notified.  Earnest ConroyBrooke M. Clelia CroftShaw, RN

## 2015-12-03 ENCOUNTER — Other Ambulatory Visit: Payer: Self-pay | Admitting: *Deleted

## 2015-12-03 LAB — GLUCOSE, CAPILLARY
GLUCOSE-CAPILLARY: 173 mg/dL — AB (ref 65–99)
Glucose-Capillary: 223 mg/dL — ABNORMAL HIGH (ref 65–99)
Glucose-Capillary: 223 mg/dL — ABNORMAL HIGH (ref 65–99)

## 2015-12-03 MED ORDER — CEFUROXIME AXETIL 500 MG PO TABS: 500.0000 mg | ORAL_TABLET | Freq: Every day | ORAL | Status: AC

## 2015-12-03 MED ORDER — OXYCODONE HCL 5 MG PO TABS: 5.0000 mg | ORAL_TABLET | Freq: Three times a day (TID) | ORAL | 0 refills | Status: DC | PRN

## 2015-12-03 MED ORDER — PREGABALIN 75 MG PO CAPS: ORAL_CAPSULE | ORAL | 0 refills | Status: DC

## 2015-12-03 MED ORDER — DILTIAZEM HCL ER COATED BEADS 300 MG PO CP24: 300.0000 mg | ORAL_CAPSULE | Freq: Every day | ORAL | Status: DC

## 2015-12-03 MED ORDER — AMIODARONE HCL 200 MG PO TABS: 200.0000 mg | ORAL_TABLET | Freq: Two times a day (BID) | ORAL | Status: DC

## 2015-12-03 NOTE — Discharge Summary (Signed)
Physician Discharge Summary  [name]:[id number] DOB: Jun 30, 1916 DOA: 11/26/2015  PCP: Tammy Hubert, MD  Admit date: 11/26/2015 Discharge date: 12/03/2015  Time spent: 35 minutes  Recommendations for Outpatient Follow-up:  Follow heart healthy diet (less than 2 gram sodium daily) Take medications as prescribed Repeat CBC and BMET in 5 days to follow electrolytes, renal function and Hgb trend Maintain adequate hydration Rehabilitation as per SNF protocol Arrange follow up in 3 weeks with Dr. Debara [name]  Discharge Diagnoses:  Principal Problem:   Atrial fibrillation with RVR ([place]) Active Problems:   Lower urinary tract infectious disease   CKD stage 4 due to type 2 diabetes mellitus ([place])   Acute on chronic combined systolic and diastolic CHF (congestive heart failure) (HCC)   SOB (shortness of breath) Sepsis due to UTI  Discharge Condition: stable and improved. Will discharge to SNF for further care and rehabilitation.  Diet recommendation: heart healthy diet (lo sodium) and modified carbohydrates. (please follow Dysphagia 3 diet; easier for her to swallow)  Filed Weights   12/01/15 0540 12/02/15 0447 12/03/15 0425  Weight: 74 kg (163 lb 2.3 oz) 76.3 kg (168 lb 3.4 oz) 73.9 kg (162 lb 14.7 oz)    History of present illness:  As per H&P written by Dr. Alcario [name] on 11/26/15 82 y.o. female with medical history significant of A.Fib, not on anticoagulation due to prior AVM bleed, DM diet controlled.  Patient presents to the ED with c/o Lethargy and AMS.  Suspected sepsis per facility who sends her in.  She had recent admit last month for PNA.  Per facility WBC elevated.  At this point patient is at her baseline and has no complaints.  ED Course: HR 130s A.Fib RVR, WBC 16k, UA positive for UTI.  Given 2.5L bolus, zosyn / vanc.  Hospital Course:  1. A.Fib RVR - 1. CHADsVASC score 4 2. Now HR is better, but still irregular and with intermittent episodes of HR up to 120's  overnight  3. 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 Lysbeth [name] 10/18/12 office note).continue ASA. 4. Will avoid B-blockers given wheezing and bronchospasm. After discussing with cardiology, will continue oral cardizem, now transitioned to extended release form '300mg'$  daily. Per cardiology rec's will also continue amiodarone, now '200mg'$  BID and  With plans to adjust further to '200mg'$  daily in 3 weeks (when she follow up with Dr. Debara [name] at that time). 2. Sepsis due to UTI - unknown pathogen at this time  1. patient met criteria for sepsis with HR > 90, RR > 22, WBC 16 K on admission. 2. Started on vanc and zosyn, total abx's day #6; plan is to treat for 10 days total . 3. No growth isolated. Will narrow further to ceftin now and complete abx therapy by motuh. 4. Will discontinue foley 3. Acute resp failure: with hypoxia and tachypnea. With concerns for CHF exacerbation and bronchospasm. Excellent response to IV lasix. Will discharge on PO diuretic (lasix '20mg'$  daily). Continue daily weights and low sodium diet. No wheezing and good O2 sat on RA. 4. Acute on Chronic diastolic CHF - as a consequence from fluid resuscitation most likely. 1. Needs to have close follow up of her weight on daily basis. Adequate hydration encourage.  2. Will discharge on PO lasix and follow low sodium diet 3. No need for O2 supplementation at discharge 5. CKD stage 4 - chronic and baseline; will monitor closely especially with ongoing use of diuresis. 6.  Anemia of chronic disease, CKD - no signs of bleeding, will recommend CBC to follow Hgb trend 7. Acute encephalopathy, toxic/metabolic from sepsis: due to infection; will continue antibiotics and follow clinical response. Patient is AAOX2, following commands properly and per son close to baseline. 8. DM2 with complications of nephropathy - appears to have no meds at home. Will recommend to follow low carb diet. Patient use SSI while  inpatient. 9. HTN: will continue use of Cardizem and lasix at discharge. BP is stable. Heart healthy diet recommended  10. Hypothyroidism: will continue synthroid recently adjusted.  Procedures: See below for x-ray reports   Consultations:  Cardiology   Discharge Exam: Vitals:   12/03/15 1220 12/03/15 1337  BP: 132/87   Pulse: 77 73  Resp: 20 16  Temp:     General exam: breathing ok, no CP, palpitations or acute complaints. Tolerating having her oxygen weaned off. A. Fib with Rate controlled now. Patient has remained afebrile and demonstrated improvement in her appetite. AAOX2 (baseline as per family reports)  Respiratory system: No frank exp wheezing, still with diminished breath sounds at bases but no frank crackles on exam; no using accessory muscles.  Cardiovascular system: rate controlled, No murmurs, rubs, gallops or clicks appreciated. Positive  pedal edema bilaterally. Gastrointestinal system: Abdomen is nondistended, soft and nontender. Positive BS Central nervous system: moving all 4 extremities spontaneously; no focal deficit, able to follow commands properly .  Discharge Instructions   Discharge Instructions    (HEART FAILURE PATIENTS) Call MD:  Anytime you have any of the following symptoms: 1) 3 pound weight gain in 24 hours or 5 pounds in 1 week 2) shortness of breath, with or without a dry hacking cough 3) swelling in the hands, feet or stomach 4) if you have to sleep on extra pillows at night in order to breathe.    Complete by:  As directed    Diet - low sodium heart healthy    Complete by:  As directed    Discharge instructions    Complete by:  As directed    Follow heart healthy diet (less than 2 gram sodium daily) Take medications as prescribed Repeat CBC and BMET in 5 days to follow electrolytes, renal function and Hgb trend Maintain adequate hydration Rehabilitation as per SNF protocol Arrange follow up in 3 weeks with Dr. Debara [name]   Increase activity  slowly    Complete by:  As directed      Current Discharge Medication List    START taking these medications   Details  amiodarone (PACERONE) 200 MG tablet Take 1 tablet (200 mg total) by mouth 2 (two) times daily. Treatment for 3 weeks at this dose; then changed to 200 mg daily    cefUROXime (CEFTIN) 500 MG tablet Take 1 tablet (500 mg total) by mouth daily with breakfast.    diltiazem (CARDIZEM CD) 300 MG 24 hr capsule Take 1 capsule (300 mg total) by mouth daily.      CONTINUE these medications which have CHANGED   Details  oxyCODONE (ROXICODONE) 5 MG immediate release tablet Take 1 tablet (5 mg total) by mouth every 8 (eight) hours as needed for severe pain. Qty: 20 tablet, Refills: 0      CONTINUE these medications which have NOT CHANGED   Details  aspirin EC 81 MG tablet Take 1 tablet (81 mg total) by mouth daily. Qty: 30 tablet, Refills: 0    atorvastatin (LIPITOR) 20 MG tablet Take one tablet by mouth once  daily to lower cholesterol Qty: 90 tablet, Refills: 1    calcitonin, salmon, (MIACALCIN/FORTICAL) 200 UNIT/ACT nasal spray INSTILL 1 SPRAY INTO ALTERNATING NOSTRILS ONCE A DAY FOR OSTEOPROSIS Qty: 3.7 mL, Refills: 5    dextromethorphan-guaiFENesin (MUCINEX DM) 30-600 MG per 12 hr tablet Take 1 tablet by mouth every 12 (twelve) hours as needed for cough.     furosemide (LASIX) 20 MG tablet Take 1 tablet (20 mg total) by mouth daily.    ipratropium-albuterol (DUONEB) 0.5-2.5 (3) MG/3ML SOLN Take 3 mLs by nebulization 3 (three) times daily.    lansoprazole (PREVACID) 30 MG capsule Take one capsule by mouth once daily for stomach Qty: 30 capsule, Refills: 11    latanoprost (XALATAN) 0.005 % ophthalmic solution Place 1 drop into the left eye at bedtime.     levothyroxine (SYNTHROID, LEVOTHROID) 150 MCG tablet Take 150 mcg by mouth daily before breakfast.    Multiple Vitamins-Minerals (CENTRUM SILVER PO) Take 1 tablet by mouth daily.    polyethylene glycol (MIRALAX  / GLYCOLAX) packet Take 17 g by mouth daily. Qty: 14 each, Refills: 0    pregabalin (LYRICA) 75 MG capsule Take 1 capsule (75 mg total) by mouth 2 (two) times daily. Take one capsule by mouth twice daily as needed for pain and nerves   Associated Diagnoses: Hereditary and idiopathic peripheral neuropathy    saccharomyces boulardii (FLORASTOR) 250 MG capsule Take 1 capsule (250 mg total) by mouth 2 (two) times daily. Qty: 20 capsule, Refills: 0    Trospium Chloride 60 MG CP24 Take one capsule by mouth once daily for bladder control Qty: 30 capsule, Refills: 5       Allergies  Allergen Reactions  . Cosopt [Dorzolamide Hcl-Timolol Mal] Other (See Comments)    Redness and burning in both eyes  . Noroxin [Norfloxacin] Swelling  . Sulfa Antibiotics Nausea And Vomiting  . Prozac [Fluoxetine Hcl] Rash    Contact information for follow-up providers    Pixie Casino, MD. Schedule an appointment as soon as possible for a visit in 3 week(s).   Specialty:  Cardiology Why:  call office to schedule appointment  Contact information: [place] [phone]            Contact information for after-discharge care    Destination    [facility] .   Specialty:  Skilled Nursing Facility Contact information: [facility] [place] [phone]                  The results of significant diagnostics from this hospitalization (including imaging, microbiology, ancillary and laboratory) are listed below for reference.    Significant Diagnostic Studies: Dg Chest 1 View  Result Date: 11/28/2015 CLINICAL DATA:  Shortness of Breath.  Atrial fib. EXAM: CHEST 1 VIEW COMPARISON:  11/26/2015 FINDINGS: Prior CABG. Heart is borderline in size. Increasing interstitial disease throughout the lungs, likely interstitial edema. Small bilateral pleural effusions with bibasilar atelectasis. IMPRESSION: Mild interstitial edema  with small effusions and bibasilar atelectasis. Electronically Signed   By: Rolm Baptise M.D.   On: 11/28/2015 13:00   Dg Chest 2 View  Result Date: 11/18/2015 CLINICAL DATA:  82 year old female with a history of cough EXAM: CHEST  2 VIEW COMPARISON:  11/13/2015, 10/07/2012 FINDINGS: Cardiomediastinal silhouette unchanged. Calcifications of the aortic arch. Surgical changes of prior median sternotomy and CABG. Prominence of central vasculature with interlobular septal thickening of the bilateral lungs. Low lung volumes with blunting  of the left costophrenic angle. No pneumothorax. Thickening of the minor fissure. Blunting of the costophrenic sulcus on the lateral view No displaced fracture. Osteopenia. Surgical changes of prior vertebral augmentation. No new acute fracture identified. IMPRESSION: Evidence of congestive heart failure with small pleural effusions. Surgical changes of median sternotomy and CABG. Signed, Dulcy Fanny. Earleen Newport, DO [facility] Electronically Signed   By: Corrie Mckusick D.O.   On: 11/18/2015 17:23   Ct Head Wo Contrast  Result Date: 11/13/2015 CLINICAL DATA:  Altered mental status, unresponsive EXAM: CT HEAD WITHOUT CONTRAST TECHNIQUE: Contiguous axial images were obtained from the base of the skull through the vertex without intravenous contrast. COMPARISON:  CT brain of 10/08/2012 FINDINGS: Brain: The ventricular system remains dilated, and there are diffusely prominent cortical sulci, consistent with diffuse atrophy. Moderate to severe small vessel ischemic change is again noted throughout the periventricular white matter. There is low-attenuation rounded structure within the anterior limb of the internal capsule on the left which may represent acute or subacute lacunar infarct. No hemorrhage, mass lesion, or other area of infarction is seen. Vascular: No vascular abnormality is seen on this unenhanced study. Skull: No  calvarial abnormality is seen. Sinuses/Orbits: There is complete opacification with expansion of the right partition of the sphenoid sinus which appears relatively unchanged compared to the prior CT. This may be due to chronic sinusitis but clinical correlation is recommended. This opacification of the right sphenoid sinus measures 51 HU which could be due to proteinaceous debris although a soft tissue lesion cannot be excluded. If further assessment is warranted, MR would be recommended. Other: None IMPRESSION: 1. Stable moderate atrophy and moderate to severe small vessel ischemic change throughout the periventricular white matter. 2. Small low-attenuation structure in the anterior limb of the left internal capsule not definitely seen previously. Consider acute or subacute lacunar infarct. 3. Complete opacification of the right partition of the sphenoid sinus may be due to sinusitis possibly chronic but soft tissue lesion cannot be excluded as noted above. Electronically Signed   By: Ivar Drape M.D.   On: 11/13/2015 10:31   Dg Chest Port 1 View  Result Date: 11/26/2015 CLINICAL DATA:  Sepsis.  Altered mental status today. EXAM: PORTABLE CHEST 1 VIEW COMPARISON:  PA and lateral chest 11/18/2015 and 10/07/2012. Single view of the chest 11/13/2015. FINDINGS: Interstitial edema seen on the most recent examination has resolved. Mild left basilar atelectasis is noted. The lungs are otherwise clear. Heart size is upper normal. Aortic atherosclerosis is seen. The patient is status post CABG. IMPRESSION: Subsegmental atelectasis left lung base. Interstitial edema on the most recent examination has resolved. Atherosclerosis. Electronically Signed   By: Inge Rise M.D.   On: 11/26/2015 18:22   Dg Chest Port 1 View  Result Date: 11/13/2015 CLINICAL DATA:  Altered mental status.  Possible sepsis. EXAM: PORTABLE CHEST 1 VIEW COMPARISON:  10/07/2012 . FINDINGS: Prior CABG. Heart size stable. Bibasilar infiltrates  left side greater than right noted these findings consist with pneumonia. Component basilar atelectasis noted. Small left pleural effusion. IMPRESSION: 1. Bibasilar pneumonia left side greater than right. Associated bibasilar subsegmental atelectasis. Small left pleural effusion. 2. Prior CABG.  Heart size stable.  No pulmonary venous congestion . Electronically Signed   By: Marcello Moores  Register   On: 11/13/2015 09:39    Microbiology: Recent Results (from the past 240 hour(s))  Blood Culture (routine x 2)     Status: None   Collection Time: 11/26/15  5:41 PM  Result Value Ref Range Status   Specimen Description BLOOD RIGHT ANTECUBITAL  Final   Special Requests IN PEDIATRIC BOTTLE 3CC  Final   Culture   Final    NO GROWTH 5 DAYS Performed at [facility]    Report Status 12/01/2015 FINAL  Final  Urine culture     Status: None   Collection Time: 11/26/15  8:13 PM  Result Value Ref Range Status   Specimen Description URINE, RANDOM  Final   Special Requests NONE  Final   Culture NO GROWTH Performed at [facility]   Final   Report Status 11/28/2015 FINAL  Final  MRSA PCR Screening     Status: None   Collection Time: 11/26/15 10:55 PM  Result Value Ref Range Status   MRSA by PCR NEGATIVE NEGATIVE Final    Comment:        The GeneXpert MRSA Assay (FDA approved for NASAL specimens only), is one component of a comprehensive MRSA colonization surveillance program. It is not intended to diagnose MRSA infection nor to guide or monitor treatment for MRSA infections.   Blood Culture (routine x 2)     Status: None   Collection Time: 11/27/15  1:51 AM  Result Value Ref Range Status   Specimen Description BLOOD RIGHT HAND  Final   Special Requests IN PEDIATRIC BOTTLE 2ML  Final   Culture   Final    NO GROWTH 5 DAYS Performed at [facility]    Report Status 12/02/2015 FINAL  Final     Labs: Basic Metabolic Panel:  Recent Labs Lab 11/26/15 1741 11/27/15 0430  11/28/15 0318 11/29/15 0330 12/01/15 0825  NA 138 139 143 142 140  K 4.3 3.8 4.0 3.6 3.5  CL 103 111 114* 111 105  CO2 26 22 21* 23 24  GLUCOSE 229* 188* 193* 136* 198*  BUN 40* 34* 26* 25* 21*  CREATININE 2.60* 1.94* 1.94* 1.95* 1.92*  CALCIUM 10.1 8.4* 9.5 10.1 10.3   Liver Function Tests:  Recent Labs Lab 11/26/15 1741  AST 17  ALT 13*  ALKPHOS 79  BILITOT 0.7  PROT 6.7  ALBUMIN 3.1*   CBC:  Recent Labs Lab 11/26/15 1741 11/27/15 0430 11/28/15 0318 11/29/15 0330 12/01/15 0825  WBC 16.4* 14.9* 8.4 9.1 10.0  NEUTROABS 12.7*  --   --   --  6.7  HGB 10.1* 9.0* 8.8* 9.0* 10.3*  HCT 31.4* 28.7* 27.6* 28.5* 31.4*  MCV 96.0 100.7* 99.3 98.6 93.5  PLT 352 365 287 316 347   Cardiac Enzymes: No results for input(s): CKTOTAL, CKMB, CKMBINDEX, TROPONINI in the last 168 hours. BNP: BNP (last 3 results)  Recent Labs  11/18/15 1933  BNP 4,234.6*   CBG:  Recent Labs Lab 12/02/15 0804 12/02/15 1143 12/02/15 1813 12/03/15 0750 12/03/15 1125  GLUCAP 167* 192* 154* 173* 223*     Signed:  Barton Dubois MD.  [facility] 12/03/2015, 3:18 PM

## 2015-12-03 NOTE — Care Management Note (Signed)
Case Management Note  Patient Details  Name: Juan QuamWinifred D Biegel MRN: [id number] Date of Birth: 1916-11-25  Subjective/Objective:   D/c back to [facility] today-CSW already following                 Action/Plan:d/c SNF.   Expected Discharge Date:   (unknown)               Expected Discharge Plan:  Skilled Nursing Facility  In-House Referral:  Clinical Social Work  Discharge planning Services  CM Consult  Post Acute Care Choice:    Choice offered to:     DME Arranged:    DME Agency:     HH Arranged:    HH Agency:     Status of Service:  In process, will continue to follow  If discussed at Long Length of Stay Meetings, dates discussed:    Additional Comments:  Lanier ClamMahabir, Ronne Stefanski, RN 12/03/2015, 1:21 PM

## 2015-12-03 NOTE — Evaluation (Signed)
Physical Therapy Evaluation Patient Details Name: Juan QuamWinifred D Iorio MRN: [id number] DOB: [birth year] Today's Date: 12/03/2015   History of Present Illness  The patient is a 82 yo woman admitted with altered mental status thought due to a UTI; h/o PAF, CAD, prior CAGB 1998, not an anti-coagulation candidate due to GI bleeding;    Clinical Impression  Pt admitted with above diagnosis. Pt currently with functional limitations due to the deficits listed below (see PT Problem List).  Pt will benefit from skilled PT to increase their independence and safety with mobility to allow discharge to the venue listed below.    Recommend pt return to SNF to incr independence prior to return home with son     Follow Up Recommendations SNF    Equipment Recommendations  None recommended by PT    Recommendations for Other Services       Precautions / Restrictions Precautions Precautions: Fall Restrictions Weight Bearing Restrictions: No      Mobility  Bed Mobility Overal bed mobility: Needs Assistance;+2 for physical assistance Bed Mobility: Supine to Sit     Supine to sit: Max assist;+2 for physical assistance;HOB elevated     General bed mobility comments: assist to bring bilat LE to EOB, elevate trunk into sitting, and bring hips toward EOB with use of bed pads; cues for sequencing and HOB elevated  Transfers Overall transfer level: Needs assistance   Transfers: Lateral/Scoot Transfers Sit to Stand: Max assist;+2 safety/equipment (son wanted to demo sit to stand transfers, cautioned hom regarding pullin on pt's shoulders) Stand pivot transfers:  (son wanted to demonstrate sit to stand transfers)      Lateral/Scoot Transfers: Max assist General transfer comment: bed pad used to scoot pt bed to chair; multi-modal cues to self assist; pt with limited effort/fatigues quickly  Ambulation/Gait                Stairs            Wheelchair Mobility    Modified Rankin (Stroke  Patients Only)       Balance Overall balance assessment: Needs assistance   Sitting balance-Leahy Scale: Poor Sitting balance - Comments: min A required for midline sitting; pt with posterior lean; mod cues for anterior weight shift     Standing balance-Leahy Scale: Zero                               Pertinent Vitals/Pain Pain Assessment: No/denies pain    Home Living Family/patient expects to be discharged to:: Skilled nursing facility Living Arrangements: Children (with son) Available Help at Discharge: Family;Personal care attendant;Available 24 hours/day Type of Home: House Home Access: Ramped entrance     Home Layout: One level Home Equipment: Emergency planning/management officerhower seat;Walker - 4 wheels;Walker - 2 wheels;Other (comment);Wheelchair - manual Additional Comments: pt is w/c bound, was able to SPT to and from bed and toilet; has an aide 1x/wk    Prior Function     Gait / Transfers Assistance Needed: SPTs only. Uses w/c  ADL's / Homemaking Assistance Needed: assist with showers once a week from aide, dressing. Pt able to apply make up, self feed and grooming; pt needing more help recently        Hand Dominance        Extremity/Trunk Assessment   Upper Extremity Assessment: Defer to OT evaluation           Lower Extremity Assessment: Generalized weakness RLE Deficits /  Details:  ankles plantarflexed; AAROM limited ankle DF, AROM knee extension WFL, strength at least 3/5 knee extension       Communication   Communication: No difficulties  Cognition Arousal/Alertness: Awake/alert Behavior During Therapy: WFL for tasks assessed/performed Overall Cognitive Status: Impaired/Different from baseline Area of Impairment: Following commands;Problem solving       Following Commands: Follows one step commands with increased time     Problem Solving: Slow processing;Requires verbal cues;Requires tactile cues;Decreased initiation;Difficulty sequencing       General Comments      Exercises     Assessment/Plan    PT Assessment Patient needs continued PT services  PT Problem List Decreased strength;Decreased mobility;Decreased balance;Decreased activity tolerance;Decreased safety awareness          PT Treatment Interventions DME instruction;Functional mobility training;Balance training;Therapeutic exercise;Therapeutic activities;Patient/family education    PT Goals (Current goals can be found in the Care Plan section)  Acute Rehab PT Goals Patient Stated Goal: go home after rehab PT Goal Formulation: With patient/family Time For Goal Achievement: 12/13/15 Potential to Achieve Goals: Fair    Frequency Min 3X/week   Barriers to discharge        Co-evaluation               End of Session   Activity Tolerance: Patient tolerated treatment well;Patient limited by fatigue Patient left: in chair;with call bell/phone within reach;with chair alarm set;with family/visitor present           Time: 0737-1062 PT Time Calculation (min) (ACUTE ONLY): 20 min   Charges:   PT Evaluation $PT Eval Moderate Complexity: 1 Procedure     PT G Codes:        Jamela Cumbo 12/13/15, 12:00 PM

## 2015-12-03 NOTE — Progress Notes (Signed)
Primary Cardiologist: Dr. Rennis GoldenHilty   Patient Profile: The patient is a 82 yo woman with a h/o PAF, CAD, prior CAGB 1998, not an anti-coagulation candidate due to GI bleeding (AVM's) and chronic diastolic HF, who was admitted with altered mental status thought due to a UTI. Cardiology consulted for atrial fibrillation with RVR. She has been treated with IV anti-biotics.  She was started on IV amiodarone to help with rate control. Now on PO amio.   Subjective: Feeling better, but not much rest last night. Son by bedside assisting patient with breakfast.  Objective: Vital signs in last 24 hours: Temp:  [98 F (36.7 C)-98.5 F (36.9 C)] 98.3 F (36.8 C) (10/09 0425) Pulse Rate:  [72-113] 80 (10/09 0425) Resp:  [18-20] 20 (10/09 0425) BP: (124-135)/(57-99) 130/69 (10/09 0425) SpO2:  [90 %-97 %] 96 % (10/09 0425) Weight:  [162 lb 14.7 oz (73.9 kg)] 162 lb 14.7 oz (73.9 kg) (10/09 0425) Last BM Date: 12/02/15  Intake/Output from previous day: 10/08 0701 - 10/09 0700 In: 720 [P.O.:720] Out: -  Intake/Output this shift: No intake/output data recorded.  Medications Current Facility-Administered Medications  Medication Dose Route Frequency Provider Last Rate Last Dose  . acetaminophen (TYLENOL) tablet 650 mg  650 mg Oral Q4H PRN Hillary BowJared M Gardner, DO   650 mg at 11/27/15 2105  . albuterol (PROVENTIL) (2.5 MG/3ML) 0.083% nebulizer solution 2.5 mg  2.5 mg Nebulization Q4H PRN Hillary BowJared M Gardner, DO   2.5 mg at 12/02/15 0015  . amiodarone (PACERONE) tablet 400 mg  400 mg Oral BID Vassie Lollarlos Madera, MD   400 mg at 12/02/15 2153  . aspirin EC tablet 81 mg  81 mg Oral Daily Hillary BowJared M Gardner, DO   81 mg at 12/02/15 1010  . atorvastatin (LIPITOR) tablet 20 mg  20 mg Oral q1800 Hillary BowJared M Gardner, DO   20 mg at 12/02/15 1823  . budesonide (PULMICORT) nebulizer solution 0.25 mg  0.25 mg Nebulization BID Vassie Lollarlos Madera, MD   0.25 mg at 12/02/15 1940  . calcitonin (salmon) (MIACALCIN/FORTICAL) nasal spray 1 spray   1 spray Alternating Nares Daily Hillary BowJared M Gardner, DO   1 spray at 11/30/15 0929  . cefUROXime (CEFTIN) tablet 500 mg  500 mg Oral Q breakfast Vassie Lollarlos Madera, MD   500 mg at 12/02/15 1011  . darifenacin (ENABLEX) 24 hr tablet 15 mg  15 mg Oral Daily Hillary BowJared M Gardner, DO   15 mg at 12/02/15 1013  . dextromethorphan-guaiFENesin (MUCINEX DM) 30-600 MG per 12 hr tablet 1 tablet  1 tablet Oral BID PRN Vassie Lollarlos Madera, MD   1 tablet at 12/02/15 1011  . diltiazem (CARDIZEM CD) 24 hr capsule 300 mg  300 mg Oral Daily Vassie Lollarlos Madera, MD   300 mg at 12/02/15 1010  . furosemide (LASIX) tablet 20 mg  20 mg Oral Daily Vassie Lollarlos Madera, MD   20 mg at 12/02/15 1010  . hydrALAZINE (APRESOLINE) injection 10 mg  10 mg Intravenous Q8H PRN Vassie Lollarlos Madera, MD   10 mg at 11/28/15 1249  . insulin aspart (novoLOG) injection 0-9 Units  0-9 Units Subcutaneous TID WC Hillary BowJared M Gardner, DO   2 Units at 12/02/15 1823  . ipratropium-albuterol (DUONEB) 0.5-2.5 (3) MG/3ML nebulizer solution 3 mL  3 mL Nebulization TID Vassie Lollarlos Madera, MD   3 mL at 12/02/15 1940  . latanoprost (XALATAN) 0.005 % ophthalmic solution 1 drop  1 drop Left Eye QHS Hillary BowJared M Gardner, DO   1 drop at 12/02/15 2156  .  levothyroxine (SYNTHROID, LEVOTHROID) tablet 150 mcg  150 mcg Oral QAC breakfast Hillary Bow, DO   150 mcg at 12/02/15 1010  . LORazepam (ATIVAN) injection 0.5 mg  0.5 mg Intravenous Q8H PRN Vassie Loll, MD   0.5 mg at 12/03/15 0230  . metoprolol (LOPRESSOR) injection 2.5 mg  2.5 mg Intravenous Q6H PRN Leda Gauze, NP   2.5 mg at 12/02/15 0207  . ondansetron (ZOFRAN) injection 4 mg  4 mg Intravenous Q6H PRN Hillary Bow, DO      . oxyCODONE (Oxy IR/ROXICODONE) immediate release tablet 5 mg  5 mg Oral Q6H PRN Hillary Bow, DO      . pantoprazole (PROTONIX) EC tablet 40 mg  40 mg Oral Daily Hillary Bow, DO   40 mg at 12/02/15 1009  . polyethylene glycol (MIRALAX / GLYCOLAX) packet 17 g  17 g Oral Daily Hillary Bow, DO   17 g at  12/02/15 1009  . pregabalin (LYRICA) capsule 75 mg  75 mg Oral BID PRN Hillary Bow, DO   75 mg at 11/28/15 1059  . saccharomyces boulardii (FLORASTOR) capsule 250 mg  250 mg Oral BID Hillary Bow, DO   250 mg at 12/02/15 2153    PE: General appearance: alert, cooperative, no distress and elderly Neck: no carotid bruit and no JVD Lungs: clear to auscultation bilaterally Heart: irregularly irregular rhythm and regular rate Extremities: no LEE Pulses: 2+ and symmetric Skin: warm and dry Neurologic: Grossly normal  Lab Results:   Recent Labs  12/01/15 0825  WBC 10.0  HGB 10.3*  HCT 31.4*  PLT 347   BMET  Recent Labs  12/01/15 0825  NA 140  K 3.5  CL 105  CO2 24  GLUCOSE 198*  BUN 21*  CREATININE 1.92*  CALCIUM 10.3      Assessment/Plan  Principal Problem:   Atrial fibrillation with RVR (HCC) Active Problems:   Lower urinary tract infectious disease   CKD stage 4 due to type 2 diabetes mellitus (HCC)   Acute on chronic combined systolic and diastolic CHF (congestive heart failure) (HCC)   SOB (shortness of breath)   1. Atrial Fibrillation: pt remains in afib,however rate has improved. Now with CVR/SVR. HR currently in the upper 50s on telemetry. She has been transitioned to PO amiodarone. Pt seen by Dr. Ladona Ridgel yesterday. Recommendations are for 200 mg BID x 4 weeks, followed by 200 mg daily thereafter. No a/c given risk for recurrent GIB, given AVMs.   2. Chronic Diastolic HR: euvolemic on exam.   3. UTI: on antibiotics, management per IM.    LOS: 7 days    Brittainy M. Sharol Harness, PA-C 12/03/2015 8:01 AM   History and all data above reviewed.  Patient examined.  I agree with the findings as above.   She denies any SOB or pain.   The patient exam reveals COR  Irregular  ,  Lungs: Decreased breath sounds without crackles  ,  Abd: Positive bowel sounds, no rebound no guarding, Ext:  Trace edema    .  All available labs, radiology testing, previous records  reviewed. Agree with documented assessment and plan. Atrial fib with good rate control.  Continue current therapy.    Fayrene Fearing Jorryn Casagrande  12:16 PM  12/03/2015

## 2015-12-03 NOTE — Progress Notes (Signed)
Patient is set to [facility] today. Patient & son at bedside, Onalee HuaDavid made aware. Discharge packet given to RN, Boneta LucksJenny. [facility] called for transport.     Lincoln MaxinKelly Legacy Lacivita, LCSW [facility] Clinical Social Worker cell #: [phone]

## 2015-12-03 NOTE — Telephone Encounter (Signed)
[facility] #[phone] Fax: [phone] 

## 2015-12-04 ENCOUNTER — Encounter: Payer: Self-pay | Admitting: Internal Medicine

## 2015-12-04 ENCOUNTER — Non-Acute Institutional Stay (SKILLED_NURSING_FACILITY): Payer: Medicare Other | Admitting: Internal Medicine

## 2015-12-04 DIAGNOSIS — I4891 Unspecified atrial fibrillation: Secondary | ICD-10-CM

## 2015-12-04 DIAGNOSIS — I5022 Chronic systolic (congestive) heart failure: Secondary | ICD-10-CM | POA: Diagnosis not present

## 2015-12-04 DIAGNOSIS — K2990 Gastroduodenitis, unspecified, without bleeding: Secondary | ICD-10-CM

## 2015-12-04 DIAGNOSIS — N3000 Acute cystitis without hematuria: Secondary | ICD-10-CM

## 2015-12-04 DIAGNOSIS — N3281 Overactive bladder: Secondary | ICD-10-CM | POA: Diagnosis not present

## 2015-12-04 DIAGNOSIS — Z8673 Personal history of transient ischemic attack (TIA), and cerebral infarction without residual deficits: Secondary | ICD-10-CM | POA: Diagnosis not present

## 2015-12-04 DIAGNOSIS — K2970 Gastritis, unspecified, without bleeding: Secondary | ICD-10-CM

## 2015-12-04 DIAGNOSIS — N184 Chronic kidney disease, stage 4 (severe): Secondary | ICD-10-CM | POA: Diagnosis not present

## 2015-12-04 DIAGNOSIS — D638 Anemia in other chronic diseases classified elsewhere: Secondary | ICD-10-CM

## 2015-12-04 DIAGNOSIS — R0602 Shortness of breath: Secondary | ICD-10-CM

## 2015-12-04 DIAGNOSIS — E038 Other specified hypothyroidism: Secondary | ICD-10-CM

## 2015-12-04 DIAGNOSIS — M792 Neuralgia and neuritis, unspecified: Secondary | ICD-10-CM

## 2015-12-04 DIAGNOSIS — E440 Moderate protein-calorie malnutrition: Secondary | ICD-10-CM | POA: Diagnosis not present

## 2015-12-04 DIAGNOSIS — R531 Weakness: Secondary | ICD-10-CM | POA: Diagnosis not present

## 2015-12-04 DIAGNOSIS — R1310 Dysphagia, unspecified: Secondary | ICD-10-CM

## 2015-12-04 NOTE — Progress Notes (Signed)
LOCATION: [facility]  PCP: Murray Hodgkins, MD   Code Status: Full Code  Goals of care: Advanced Directive information Advanced Directives 11/26/2015  Does patient have an advance directive? Yes  Type of Advance Directive Out of facility DNR (pink MOST or yellow form)  Does patient want to make changes to advanced directive? No - Patient declined  Copy of advanced directive(s) in chart? Yes  Pre-existing out of facility DNR order (yellow form or pink MOST form) Yellow form placed in chart (order not valid for inpatient use)       Extended Emergency Contact Information Primary Emergency Contact: Tonita Phoenix Address: [address] [place], [place] [address] [place] Home Phone: [phone] Mobile Phone: [phone] Relation: Son   Allergies  Allergen Reactions  . Cosopt [Dorzolamide Hcl-Timolol Mal] Other (See Comments)    Redness and burning in both eyes  . Noroxin [Norfloxacin] Swelling  . Sulfa Antibiotics Nausea And Vomiting  . Prozac [Fluoxetine Hcl] Rash    Chief Complaint  Patient presents with  . Readmit To SNF    Readmission Visit     HPI:  Patient is a 82 y.o. female seen today for short term rehabilitation post hospital re-admission from 11/26/15-12/03/15 with urosepsis and afib with RVR. She was placed on cardizem and amiodarone by cardiology. She was started on iv antibiotic and switched to po antibiotic at discharge. She was here undergoing short term rehabilitation post hospital admission from 11/13/15-11/21/15 with aspiration pneumonia, UTI and chf exacerbation.  She has PMH of DM, afib, hypothyroidism. She is seen in her room today with her son at bedside.    Review of Systems:  Constitutional: Negative for fever HENT: Negative for headache, congestion, nasal discharge Respiratory: Negative for wheezing. Positive for cough and shortness of breath.  Cardiovascular: Negative for chest pain, palpitations, leg swelling.    Gastrointestinal: Negative for heartburn, nausea, vomiting, abdominal pain. Last bowel movement was yesterday. Genitourinary: Negative for dysuria Musculoskeletal: Negative for back pain, fall in the facility.  Skin: Negative for itching, rash.  Neurological: Positive for occasional dizziness. Psychiatric/Behavioral: Negative for depression   Past Medical History:  Diagnosis Date  . Allergic rhinitis, cause unspecified   . Anemia   . Arthralgia of temporomandibular joint   . Arthropathy, unspecified, site unspecified   . Atrial fibrillation (HCC)   . Bronchitis, acute   . Carpal tunnel syndrome   . Chest pain, unspecified   . Chronic kidney disease, unspecified   . Closed dislocation, thoracic vertebra   . Closed fracture of lumbar vertebra without mention of spinal cord injury   . Diabetes type 2, uncontrolled (HCC)   . Diabetes with renal manifestations(250.4)   . Diastasis of muscle   . Eczema   . Gait disorder   . Hematuria, unspecified   . Hemoptysis   . History of nuclear stress test 06/06/2010   lexiscan; normal pattern of perfusion; low risk   . Hyperlipidemia   . Hyperpotassemia   . Hypertension   . Hypothyroid   . Insomnia, unspecified   . Macular degeneration (senile) of retina, unspecified   . Mild memory disturbance   . Myoclonus   . Myoclonus   . Obesity, unspecified   . Open wound of knee, leg (except thigh), and ankle, without mention of complication   . Osteoporosis   . Other atopic dermatitis and related conditions   . Other disorder of calcium metabolism   .  Other disorder of calcium metabolism   . Other specified idiopathic peripheral neuropathy   . Personal history of fall   . Personality change due to conditions classified elsewhere   . Scoliosis   . Seborrheic keratosis   . Trigger finger (acquired)   . Type II or unspecified type diabetes mellitus without mention of complication, not stated as uncontrolled   . Unspecified closed fracture of  pelvis   . Unspecified constipation   . Unspecified glaucoma(365.9)   . Unspecified hereditary and idiopathic peripheral neuropathy   . Unspecified hypertrophic and atrophic condition of skin   . Unspecified pruritic disorder   . Unspecified urinary incontinence   . Unspecified vitamin D deficiency   . Urinary tract infection, site not specified   . Varicose veins of lower extremities with inflammation    Past Surgical History:  Procedure Laterality Date  . ABDOMINAL HYSTERECTOMY     1973  . BIOPSY BREAST     2000  . CORONARY ARTERY BYPASS GRAFT     Dr Tyrone SageGerhardt 04/1996  . ESOPHAGOGASTRODUODENOSCOPY N/A 08/17/2012   Procedure: ESOPHAGOGASTRODUODENOSCOPY (EGD);  Surgeon: Hilarie FredricksonJohn N Perry, MD;  [facility];  Service: Endoscopy;  Laterality: N/A;  . HOT HEMOSTASIS N/A 08/17/2012   Procedure: HOT HEMOSTASIS (ARGON PLASMA COAGULATION/BICAP);  Surgeon: Hilarie FredricksonJohn N Perry, MD;  Location[facility];  Service: Endoscopy;  Laterality: N/A;  . KNEE ARTHROSCOPY     left Dr Thurston HoleWainer   . LASER LAPAROSCOPY     right eye 04/2005  . LUMBAR SPINE SURGERY     Dr Newell CoralNudelman   . RIGHT OOPHORECTOMY     1950   Social History:   reports that she has never smoked. She has never used smokeless tobacco. She reports that she drinks alcohol. She reports that she does not use drugs.  Family History  Problem Relation Age of Onset  . Diabetes Mother   . Cancer Sister     colon  . Alzheimer's disease Sister   . Prostate cancer Son     Medications:   Medication List       Accurate as of 12/04/15 12:29 PM. Always use your most recent med list.          amiodarone 200 MG tablet Commonly known as:  PACERONE Take 1 tablet (200 mg total) by mouth 2 (two) times daily. Treatment for 3 weeks at this dose; then changed to 200 mg daily   aspirin EC 81 MG tablet Take 1 tablet (81 mg total) by mouth daily.   atorvastatin 20 MG tablet Commonly known as:  LIPITOR Take one tablet by mouth once daily to  lower cholesterol   calcitonin (salmon) 200 UNIT/ACT nasal spray Commonly known as:  MIACALCIN/FORTICAL INSTILL 1 SPRAY INTO ALTERNATING NOSTRILS ONCE A DAY FOR OSTEOPROSIS   cefUROXime 500 MG tablet Commonly known as:  CEFTIN Take 1 tablet (500 mg total) by mouth daily with breakfast.   CENTRUM SILVER PO Take 1 tablet by mouth daily.   dextromethorphan-guaiFENesin 30-600 MG 12hr tablet Commonly known as:  MUCINEX DM Take 1 tablet by mouth every 12 (twelve) hours as needed for cough.   diltiazem 300 MG 24 hr capsule Commonly known as:  CARDIZEM CD Take 1 capsule (300 mg total) by mouth daily.   furosemide 20 MG tablet Commonly known as:  LASIX Take 1 tablet (20 mg total) by mouth daily.   ipratropium-albuterol 0.5-2.5 (3) MG/3ML Soln Commonly known as:  DUONEB Take 3 mLs by nebulization 3 (three)  times daily.   latanoprost 0.005 % ophthalmic solution Commonly known as:  XALATAN Place 1 drop into the left eye at bedtime.   levothyroxine 150 MCG tablet Commonly known as:  SYNTHROID, LEVOTHROID Take 150 mcg by mouth daily before breakfast.   omeprazole 20 MG capsule Commonly known as:  PRILOSEC Take 20 mg by mouth daily.   oxyCODONE 5 MG immediate release tablet Commonly known as:  ROXICODONE Take 1 tablet (5 mg total) by mouth every 8 (eight) hours as needed for severe pain.   polyethylene glycol packet Commonly known as:  MIRALAX / GLYCOLAX Take 17 g by mouth daily.   pregabalin 75 MG capsule Commonly known as:  LYRICA Take 1 capsule (75 mg total) by mouth 2 (two) times daily. Take one capsule by mouth twice daily as needed for pain and nerves   saccharomyces boulardii 250 MG capsule Commonly known as:  FLORASTOR Take 1 capsule (250 mg total) by mouth 2 (two) times daily.   Trospium Chloride 60 MG Cp24 Take one capsule by mouth once daily for bladder control       Immunizations: Immunization History  Administered Date(s) Administered  . Influenza Whole  11/26/2010, 11/27/2011  . Influenza, High Dose Seasonal PF 11/30/2013  . Influenza,inj,Quad PF,36+ Mos 10/31/2015  . Influenza-Unspecified 11/19/2012, 11/30/2013, 11/27/2014, 11/27/2014  . PPD Test 11/21/2015  . Pneumococcal Conjugate-13 03/04/2011  . Pneumococcal Polysaccharide-23 10/31/2015  . Tdap 03/04/2011  . Zoster 07/14/2005     Physical Exam:  Vitals:   12/04/15 1223  BP: 139/69  Pulse: 100  Resp: 17  Temp: 98.7 F (37.1 C)  TempSrc: Oral  SpO2: 95%  Weight: 169 lb 3.2 oz (76.7 kg)  Height: 5\' 2"  (1.575 m)   Body mass index is 30.95 kg/m.   General- elderly female, obese, chronically ill appearing, in no acute distress Head- normocephalic, atraumatic Nose- no nasal discharge Throat- moist mucus membrane  Eyes- PERRLA, EOMI, no pallor, no icterus, no discharge, normal conjunctiva, normal sclera Neck- no cervical lymphadenopathy Cardiovascular- normal s1,s2, no murmur, trace left leg and 1+ right leg edema Respiratory- poor air movement, nowheeze, no rhonchi, no crackles, shortness of breath with minimal exertion noted Abdomen- bowel sounds present, soft, non tender Musculoskeletal- able to move all 4 extremities, generalized weakness, right foot drop Neurological- alert and oriented to person and place but not to time Skin- warm and dry Psychiatry- normal mood and affect     Labs reviewed: Basic Metabolic Panel:  Recent Labs  16/10/96 1636  11/28/15 0318 11/29/15 0330 12/01/15 0825  NA 140  < > 143 142 140  K 4.3  < > 4.0 3.6 3.5  CL 113*  < > 114* 111 105  CO2 18*  < > 21* 23 24  GLUCOSE 133*  < > 193* 136* 198*  BUN 37*  < > 26* 25* 21*  CREATININE 2.10*  < > 1.94* 1.95* 1.92*  CALCIUM 9.3  < > 9.5 10.1 10.3  MG 1.7  --   --   --   --   < > = values in this interval not displayed. Liver Function Tests:  Recent Labs  11/13/15 0910 11/19/15 0654 11/26/15 1741  AST 20 21 17   ALT 13* 22 13*  ALKPHOS 96 78 79  BILITOT 0.7 0.9 0.7  PROT  6.3* 6.2* 6.7  ALBUMIN 3.2* 2.7* 3.1*   No results for input(s): LIPASE, AMYLASE in the last 8760 hours. No results for input(s): AMMONIA in the last 8760  hours. CBC:  Recent Labs  11/15/15 1636  11/26/15 1741  11/28/15 0318 11/29/15 0330 12/01/15 0825  WBC 9.5  < > 16.4*  < > 8.4 9.1 10.0  NEUTROABS 6.6  --  12.7*  --   --   --  6.7  HGB 9.5*  < > 10.1*  < > 8.8* 9.0* 10.3*  HCT 30.5*  < > 31.4*  < > 27.6* 28.5* 31.4*  MCV 99.0  < > 96.0  < > 99.3 98.6 93.5  PLT 200  < > 352  < > 287 316 347  < > = values in this interval not displayed. Cardiac Enzymes:  Recent Labs  11/18/15 1933 11/19/15 0105 11/19/15 0654  TROPONINI 0.48* 0.51* 0.49*   BNP: Invalid input(s): POCBNP CBG:  Recent Labs  12/03/15 0750 12/03/15 1125 12/03/15 1641  GLUCAP 173* 223* 223*    Radiological Exams: Dg Chest 1 View  Result Date: 11/28/2015 CLINICAL DATA:  Shortness of Breath.  Atrial fib. EXAM: CHEST 1 VIEW COMPARISON:  11/26/2015 FINDINGS: Prior CABG. Heart is borderline in size. Increasing interstitial disease throughout the lungs, likely interstitial edema. Small bilateral pleural effusions with bibasilar atelectasis. IMPRESSION: Mild interstitial edema with small effusions and bibasilar atelectasis. Electronically Signed   By: Charlett Nose M.D.   On: 11/28/2015 13:00   Dg Chest 2 View  Result Date: 11/18/2015 CLINICAL DATA:  82 year old female with a history of cough EXAM: CHEST  2 VIEW COMPARISON:  11/13/2015, 10/07/2012 FINDINGS: Cardiomediastinal silhouette unchanged. Calcifications of the aortic arch. Surgical changes of prior median sternotomy and CABG. Prominence of central vasculature with interlobular septal thickening of the bilateral lungs. Low lung volumes with blunting of the left costophrenic angle. No pneumothorax. Thickening of the minor fissure. Blunting of the costophrenic sulcus on the lateral view No displaced fracture. Osteopenia. Surgical changes of prior vertebral  augmentation. No new acute fracture identified. IMPRESSION: Evidence of congestive heart failure with small pleural effusions. Surgical changes of median sternotomy and CABG. Signed, Yvone Neu. Loreta Ave, DO [facility] Electronically Signed   By: Gilmer Mor D.O.   On: 11/18/2015 17:23   Ct Head Wo Contrast  Result Date: 11/13/2015 CLINICAL DATA:  Altered mental status, unresponsive EXAM: CT HEAD WITHOUT CONTRAST TECHNIQUE: Contiguous axial images were obtained from the base of the skull through the vertex without intravenous contrast. COMPARISON:  CT brain of 10/08/2012 FINDINGS: Brain: The ventricular system remains dilated, and there are diffusely prominent cortical sulci, consistent with diffuse atrophy. Moderate to severe small vessel ischemic change is again noted throughout the periventricular white matter. There is low-attenuation rounded structure within the anterior limb of the internal capsule on the left which may represent acute or subacute lacunar infarct. No hemorrhage, mass lesion, or other area of infarction is seen. Vascular: No vascular abnormality is seen on this unenhanced study. Skull: No calvarial abnormality is seen. Sinuses/Orbits: There is complete opacification with expansion of the right partition of the sphenoid sinus which appears relatively unchanged compared to the prior CT. This may be due to chronic sinusitis but clinical correlation is recommended. This opacification of the right sphenoid sinus measures 51 HU which could be due to proteinaceous debris although a soft tissue lesion cannot be excluded. If further assessment is warranted, MR would be recommended. Other: None IMPRESSION: 1. Stable moderate atrophy and moderate to severe small vessel ischemic change throughout the periventricular white matter. 2. Small low-attenuation structure in the anterior limb of the left  internal capsule not definitely seen previously.  Consider acute or subacute lacunar infarct. 3. Complete opacification of the right partition of the sphenoid sinus may be due to sinusitis possibly chronic but soft tissue lesion cannot be excluded as noted above. Electronically Signed   By: Dwyane Dee M.D.   On: 11/13/2015 10:31   Dg Chest Port 1 View  Result Date: 11/26/2015 CLINICAL DATA:  Sepsis.  Altered mental status today. EXAM: PORTABLE CHEST 1 VIEW COMPARISON:  PA and lateral chest 11/18/2015 and 10/07/2012. Single view of the chest 11/13/2015. FINDINGS: Interstitial edema seen on the most recent examination has resolved. Mild left basilar atelectasis is noted. The lungs are otherwise clear. Heart size is upper normal. Aortic atherosclerosis is seen. The patient is status post CABG. IMPRESSION: Subsegmental atelectasis left lung base. Interstitial edema on the most recent examination has resolved. Atherosclerosis. Electronically Signed   By: Drusilla Kanner M.D.   On: 11/26/2015 18:22   Dg Chest Port 1 View  Result Date: 11/13/2015 CLINICAL DATA:  Altered mental status.  Possible sepsis. EXAM: PORTABLE CHEST 1 VIEW COMPARISON:  10/07/2012 . FINDINGS: Prior CABG. Heart size stable. Bibasilar infiltrates left side greater than right noted these findings consist with pneumonia. Component basilar atelectasis noted. Small left pleural effusion. IMPRESSION: 1. Bibasilar pneumonia left side greater than right. Associated bibasilar subsegmental atelectasis. Small left pleural effusion. 2. Prior CABG.  Heart size stable.  No pulmonary venous congestion . Electronically Signed   By: Maisie Fus  Register   On: 11/13/2015 09:39    Assessment/Plan  Generalized weakness From deconditioning. Will have her work with physical therapy and occupational therapy team to help with gait training and muscle strengthening exercises.fall precautions. Skin care. Encourage to be out of bed.   afib with RVR Rate currently controlled. Continue cardizem er 300 mg daily and  amiodarone 200 mg bid with goal to decrease to 200 mg daily. Will need cardiology follow up.   Dyspnea Likely multifactorial with her deconditioning, recent aspiration pneumonia, chf contributing. Currently on duoneb tid.  Acute cystitis Continue and complete course of ceftin 500 mg daily until 12/07/15, hydration and perineal hygiene to be maintained  Dysphagia Continue dysphagia diet. Will need aspiration precautions. Assistance to be provided with feeding. SLP to evaluate  gastritis Stable, continue prilosec 20 mg daily  OAB Continue trospium and monitor  chronic systolic chf Required iv diuresis in hospital. Continue lasix 20 mg daily for now. monitor weight 3 days a week. Check bmp. Monitor o2 sat and provide o2 by nasal canula as above. EF 35% on echocardiogram  Protein calorie malnutrition Get RD to evaluate. Monitor po intake and weight. Continue multivitamin  ckd stage 4 Monitor bmp  Anemia of chronic disease Monitor cbc  History of cva Continue aspirin and statin  Neuropathic pain Continue her lyrica 75 mg bid and monitor  Hypothyroidism Continue her levothyroxine    Goals of care: short term rehabilitation   Labs/tests ordered: cbc, cmp 12/10/15  Family/ staff Communication: reviewed care plan with patient, her son and nursing supervisor    Oneal Grout, MD Internal Medicine [facility] [address] [place], [place] [address] Cell Phone (Monday-Friday 8 am - 5 pm): [phone] On Call: [phone] and follow prompts after 5 pm and on weekends Office Phone: [phone] Office Fax: [phone]

## 2015-12-07 LAB — CBC AND DIFFERENTIAL
HEMATOCRIT: 28 % — AB (ref 36–46)
HEMOGLOBIN: 9 g/dL — AB (ref 12.0–16.0)
NEUTROS ABS: 10934 /uL
PLATELETS: 440 10*3/uL — AB (ref 150–399)
WBC: 14.2 10*3/mL

## 2015-12-07 LAB — BASIC METABOLIC PANEL
BUN: 36 mg/dL — AB (ref 4–21)
CREATININE: 3.2 mg/dL — AB (ref 0.5–1.1)
Glucose: 171 mg/dL
Potassium: 4.2 mmol/L (ref 3.4–5.3)
Sodium: 134 mmol/L — AB (ref 137–147)

## 2015-12-10 ENCOUNTER — Non-Acute Institutional Stay (SKILLED_NURSING_FACILITY): Payer: Medicare Other | Admitting: Adult Health

## 2015-12-10 ENCOUNTER — Encounter: Payer: Self-pay | Admitting: Adult Health

## 2015-12-10 DIAGNOSIS — N184 Chronic kidney disease, stage 4 (severe): Secondary | ICD-10-CM | POA: Diagnosis not present

## 2015-12-10 DIAGNOSIS — K5901 Slow transit constipation: Secondary | ICD-10-CM | POA: Diagnosis not present

## 2015-12-10 DIAGNOSIS — I5022 Chronic systolic (congestive) heart failure: Secondary | ICD-10-CM | POA: Diagnosis not present

## 2015-12-10 DIAGNOSIS — R140 Abdominal distension (gaseous): Secondary | ICD-10-CM | POA: Diagnosis not present

## 2015-12-10 DIAGNOSIS — N179 Acute kidney failure, unspecified: Secondary | ICD-10-CM | POA: Diagnosis not present

## 2015-12-10 DIAGNOSIS — D72829 Elevated white blood cell count, unspecified: Secondary | ICD-10-CM | POA: Diagnosis not present

## 2015-12-10 LAB — BASIC METABOLIC PANEL
BUN: 49 mg/dL — AB (ref 4–21)
Creatinine: 3.1 mg/dL — AB (ref 0.5–1.1)
GLUCOSE: 116 mg/dL
POTASSIUM: 4.4 mmol/L (ref 3.4–5.3)
Sodium: 136 mmol/L — AB (ref 137–147)

## 2015-12-10 LAB — CBC AND DIFFERENTIAL
HEMATOCRIT: 31 % — AB (ref 36–46)
HEMOGLOBIN: 9.5 g/dL — AB (ref 12.0–16.0)
NEUTROS ABS: 8 /uL
PLATELETS: 417 10*3/uL — AB (ref 150–399)
WBC: 11.2 10*3/mL

## 2015-12-10 LAB — HEPATIC FUNCTION PANEL
ALT: 8 U/L (ref 7–35)
AST: 15 U/L (ref 13–35)
Alkaline Phosphatase: 92 U/L (ref 25–125)
BILIRUBIN, TOTAL: 0.3 mg/dL

## 2015-12-10 NOTE — Progress Notes (Addendum)
Patient ID: Shontelle ZAMARA COZAD, female   DOB: [birth year], 82 y.o.   MRN: [id number]    DATE:  12/10/2015   MRN:  [id number]  BIRTHDAY: [birth year]  Facility:  Nursing Home Location:  [facility]  Nursing Home Room Number: 604-P  LEVEL OF CARE:  SNF [id number])  Contact Information    Name Relation Home Work [place] [phone]  [phone]       Code Status History    Date Active Date Inactive Code Status Order ID Comments User Context   11/26/2015  9:25 PM 12/03/2015  8:49 PM DNR [id number]  Hillary Bow, DO ED   11/13/2015  2:44 PM 11/21/2015  5:37 PM DNR [id number]  Meredith Pel, NP ED   11/13/2015 12:29 PM 11/13/2015  2:44 PM DNR [id number]  Simonne Martinet, NP ED   11/13/2015 12:00 PM 11/13/2015 12:29 PM DNR [id number]  Gerhard Munch, MD ED   10/07/2012  8:41 PM 10/10/2012  5:52 PM DNR [id number]  Jerald Kief, MD ED   08/14/2012  3:29 PM 08/19/2012  7:11 PM DNR [id number]  Laveda Norman, MD Inpatient    Questions for Most Recent Historical Code Status (Order [id number])    Question Answer Comment   In the event of cardiac or respiratory ARREST Do not call a "code blue"    In the event of cardiac or respiratory ARREST Do not perform Intubation, CPR, defibrillation or ACLS    In the event of cardiac or respiratory ARREST Use medication by any route, position, wound care, and other measures to relive pain and suffering. May use oxygen, suction and manual treatment of airway obstruction as needed for comfort.         Advance Directive Documentation   Flowsheet Row Most Recent Value  Type of Advance Directive  Living will, Out of facility DNR (pink MOST or yellow form), Healthcare Power of Attorney  Pre-existing out of facility DNR order (yellow form or pink MOST form)  No data  "MOST" Form in Place?  No data       Chief Complaint  Patient presents with  . Acute Visit    Distended abdomen    HISTORY OF PRESENT ILLNESS:  This is a 82 year old female  who is being seen due to distended abdomen. She has normoactive bowel sounds. KUB done showed no bowel obstruction. WBC on 12/07/15 was 14.2 and today it came down to 11.2. No fever has been reported.  Son @ bedside. Patient has no SOB. Complained of right hip pain to charge nurse earlier and was relieved with Biofreeze gel. She was seen sleeping and was awakened by charge nurse to take her medicine. She swallowed medications without difficulty nor coughing. Charge nurse reported that patient has been having small stools.  She has been admitted to [facility] on 12/03/15 from [facility] with urosepsis and afib with RVR. She was placed on Cardizem and Amiodarone by cardiology. She was given antibiotic. She was having short-term rehabilitation before being transferred to the hospital. She was previously admitted to [facility] on 11/21/15 with aspiration PNA, UTI and CHF exacerbation.   PAST MEDICAL HISTORY:  Past Medical History:  Diagnosis Date  . Allergic rhinitis, cause unspecified   . Anemia   . Arthralgia of temporomandibular joint   . Arthropathy, unspecified, site unspecified   . Atrial fibrillation (HCC)   . Bronchitis, acute   . Carpal tunnel syndrome   . Chest  pain, unspecified   . Chronic kidney disease, unspecified   . Closed dislocation, thoracic vertebra   . Closed fracture of lumbar vertebra without mention of spinal cord injury   . Diabetes type 2, uncontrolled (HCC)   . Diabetes with renal manifestations(250.4)   . Diastasis of muscle   . Eczema   . Gait disorder   . Hematuria, unspecified   . Hemoptysis   . History of nuclear stress test 06/06/2010   lexiscan; normal pattern of perfusion; low risk   . Hyperlipidemia   . Hyperpotassemia   . Hypertension   . Hypothyroid   . Insomnia, unspecified   . Macular degeneration (senile) of retina, unspecified   . Mild memory disturbance   . Myoclonus   . Obesity, unspecified   . Open wound of knee, leg (except  thigh), and ankle, without mention of complication   . Osteoporosis   . Other atopic dermatitis and related conditions   . Other disorder of calcium metabolism   . Other specified idiopathic peripheral neuropathy   . Personal history of fall   . Personality change due to conditions classified elsewhere   . Scoliosis   . Seborrheic keratosis   . Trigger finger (acquired)   . Type II or unspecified type diabetes mellitus without mention of complication, not stated as uncontrolled   . Unspecified closed fracture of pelvis   . Unspecified constipation   . Unspecified glaucoma(365.9)   . Unspecified hereditary and idiopathic peripheral neuropathy   . Unspecified hypertrophic and atrophic condition of skin   . Unspecified pruritic disorder   . Unspecified urinary incontinence   . Unspecified vitamin D deficiency   . Urinary tract infection, site not specified   . Varicose veins of lower extremities with inflammation      CURRENT MEDICATIONS: Reviewed  Patient's Medications  New Prescriptions   No medications on file  Previous Medications   AMIODARONE (PACERONE) 200 MG TABLET    Take 1 tablet (200 mg total) by mouth 2 (two) times daily. Treatment for 3 weeks at this dose; then changed to 200 mg daily   ASPIRIN EC 81 MG TABLET    Take 1 tablet (81 mg total) by mouth daily.   ATORVASTATIN (LIPITOR) 20 MG TABLET    Take one tablet by mouth once daily to lower cholesterol   CALCITONIN, SALMON, (MIACALCIN/FORTICAL) 200 UNIT/ACT NASAL SPRAY    INSTILL 1 SPRAY INTO ALTERNATING NOSTRILS ONCE A DAY FOR OSTEOPROSIS   DEXTROMETHORPHAN-GUAIFENESIN (MUCINEX DM) 30-600 MG PER 12 HR TABLET    Take 1 tablet by mouth every 12 (twelve) hours as needed for cough.    DILTIAZEM (CARDIZEM CD) 300 MG 24 HR CAPSULE    Take 1 capsule (300 mg total) by mouth daily.   FUROSEMIDE (LASIX) 20 MG TABLET    Take 1 tablet (20 mg total) by mouth daily.   IPRATROPIUM-ALBUTEROL (DUONEB) 0.5-2.5 (3) MG/3ML SOLN    Take 3  mLs by nebulization 3 (three) times daily.   LATANOPROST (XALATAN) 0.005 % OPHTHALMIC SOLUTION    Place 1 drop into the left eye at bedtime.    LEVOTHYROXINE (SYNTHROID, LEVOTHROID) 150 MCG TABLET    Take 150 mcg by mouth daily before breakfast.   MULTIPLE VITAMINS-MINERALS (CENTRUM SILVER PO)    Take 1 tablet by mouth daily.   OMEPRAZOLE (PRILOSEC) 20 MG CAPSULE    Take 20 mg by mouth daily.    OXYCODONE (ROXICODONE) 5 MG IMMEDIATE RELEASE TABLET    Take 1  tablet (5 mg total) by mouth every 8 (eight) hours as needed for severe pain.   POLYETHYLENE GLYCOL (MIRALAX / GLYCOLAX) PACKET    Take 17 g by mouth daily.   PREGABALIN (LYRICA) 75 MG CAPSULE    Take 1 capsule (75 mg total) by mouth 2 (two) times daily. Take one capsule by mouth twice daily as needed for pain and nerves   SACCHAROMYCES BOULARDII (FLORASTOR) 250 MG CAPSULE    Take 1 capsule (250 mg total) by mouth 2 (two) times daily.   TROSPIUM CHLORIDE 60 MG CP24    Take one capsule by mouth once daily for bladder control  Modified Medications   No medications on file  Discontinued Medications   No medications on file     Allergies  Allergen Reactions  . Cosopt [Dorzolamide Hcl-Timolol Mal] Other (See Comments)    Redness and burning in both eyes  . Noroxin [Norfloxacin] Swelling  . Sulfa Antibiotics Nausea And Vomiting  . Prozac [Fluoxetine Hcl] Rash     REVIEW OF SYSTEMS:  GENERAL: no change in appetite, no fatigue, no weight changes, no fever, chills or weakness EYES: Denies change in vision, dry eyes, eye pain, itching or discharge EARS: Denies change in hearing, ringing in ears, or earache NOSE: Denies nasal congestion or epistaxis MOUTH and THROAT: Denies oral discomfort, gingival pain or bleeding, pain from teeth or hoarseness   RESPIRATORY: no cough, SOB, DOE, wheezing, hemoptysis CARDIAC: no chest pain, edema or palpitations GI: no nausea or vomiting GU: Denies dysuria, frequency, hematuria, incontinence, or  discharge PSYCHIATRIC: Denies feeling of depression or anxiety. No report of hallucinations, insomnia, paranoia, or agitation    PHYSICAL EXAMINATION  GENERAL APPEARANCE: Well nourished. In no acute distress. Obese SKIN:  Skin is warm and dry.  HEAD: Normal in size and contour. No evidence of trauma EYES: Lids open and close normally. No blepharitis, entropion or ectropion. PERRL. Conjunctivae are clear and sclerae are white. Lenses are without opacity EARS: Pinnae are normal. Patient hears normal voice tunes of the examiner MOUTH and THROAT: Lips are without lesions. Oral mucosa is moist and without lesions. Tongue is normal in shape, size, and color and without lesions NECK: supple, trachea midline, no neck masses, no thyroid tenderness, no thyromegaly LYMPHATICS: no LAN in the neck, no supraclavicular LAN RESPIRATORY: breathing is even & unlabored, BS CTAB CARDIAC: RRR, no murmur,no extra heart sounds, no edema GI: distended abdomen, +normoactive bowel sounds EXTREMITIES:  Able to move X 4 extremities PSYCHIATRIC: Alert and oriented to place and person, disoriented to time. Affect and behavior are appropriate  LABS/RADIOLOGY: Labs reviewed: Basic Metabolic Panel:  Recent Labs  16/10/96 1636  11/28/15 0318 11/29/15 0330 12/01/15 0825 12/07/15 12/10/15  NA 140  < > 143 142 140 134* 136*  K 4.3  < > 4.0 3.6 3.5 4.2 4.4  CL 113*  < > 114* 111 105  --   --   CO2 18*  < > 21* 23 24  --   --   GLUCOSE 133*  < > 193* 136* 198*  --   --   BUN 37*  < > 26* 25* 21* 36* 49*  CREATININE 2.10*  < > 1.94* 1.95* 1.92* 3.2* 3.1*  CALCIUM 9.3  < > 9.5 10.1 10.3  --   --   MG 1.7  --   --   --   --   --   --   < > = values in this interval not  displayed. Liver Function Tests:  Recent Labs  11/13/15 0910 11/19/15 0654 11/26/15 11/26/15 1741 12/10/15  AST 20 21 13 17 15   ALT 13* 22 11 13* 8  ALKPHOS 96 78 96 79 92  BILITOT 0.7 0.9  --  0.7  --   PROT 6.3* 6.2*  --  6.7  --    ALBUMIN 3.2* 2.7*  --  3.1*  --    CBC:  Recent Labs  11/28/15 0318 11/29/15 0330 12/01/15 0825 12/07/15 12/10/15  WBC 8.4 9.1 10.0 14.2 11.2  NEUTROABS  --   --  6.7 10,934 8  HGB 8.8* 9.0* 10.3* 9.0* 9.5*  HCT 27.6* 28.5* 31.4* 28* 31*  MCV 99.3 98.6 93.5  --   --   PLT 287 316 347 440* 417*   Lipid Panel:  Recent Labs  09/07/15 1520  HDL 38*   Cardiac Enzymes:  Recent Labs  11/18/15 1933 11/19/15 0105 11/19/15 0654  TROPONINI 0.48* 0.51* 0.49*   CBG:  Recent Labs  12/03/15 0750 12/03/15 1125 12/03/15 1641  GLUCAP 173* 223* 223*       Dg Chest 1 View  Result Date: 11/28/2015 CLINICAL DATA:  Shortness of Breath.  Atrial fib. EXAM: CHEST 1 VIEW COMPARISON:  11/26/2015 FINDINGS: Prior CABG. Heart is borderline in size. Increasing interstitial disease throughout the lungs, likely interstitial edema. Small bilateral pleural effusions with bibasilar atelectasis. IMPRESSION: Mild interstitial edema with small effusions and bibasilar atelectasis. Electronically Signed   By: Charlett NoseKevin  Dover M.D.   On: 11/28/2015 13:00   Dg Chest 2 View  Result Date: 11/18/2015 CLINICAL DATA:  82 year old female with a history of cough EXAM: CHEST  2 VIEW COMPARISON:  11/13/2015, 10/07/2012 FINDINGS: Cardiomediastinal silhouette unchanged. Calcifications of the aortic arch. Surgical changes of prior median sternotomy and CABG. Prominence of central vasculature with interlobular septal thickening of the bilateral lungs. Low lung volumes with blunting of the left costophrenic angle. No pneumothorax. Thickening of the minor fissure. Blunting of the costophrenic sulcus on the lateral view No displaced fracture. Osteopenia. Surgical changes of prior vertebral augmentation. No new acute fracture identified. IMPRESSION: Evidence of congestive heart failure with small pleural effusions. Surgical changes of median sternotomy and CABG. Signed, Yvone NeuJaime S. Loreta AveWagner, DO [facility] Interventional Radiology  [facility] Electronically Signed   By: Gilmer MorJaime  Wagner D.O.   On: 11/18/2015 17:23   Ct Head Wo Contrast  Result Date: 11/13/2015 CLINICAL DATA:  Altered mental status, unresponsive EXAM: CT HEAD WITHOUT CONTRAST TECHNIQUE: Contiguous axial images were obtained from the base of the skull through the vertex without intravenous contrast. COMPARISON:  CT brain of 10/08/2012 FINDINGS: Brain: The ventricular system remains dilated, and there are diffusely prominent cortical sulci, consistent with diffuse atrophy. Moderate to severe small vessel ischemic change is again noted throughout the periventricular white matter. There is low-attenuation rounded structure within the anterior limb of the internal capsule on the left which may represent acute or subacute lacunar infarct. No hemorrhage, mass lesion, or other area of infarction is seen. Vascular: No vascular abnormality is seen on this unenhanced study. Skull: No calvarial abnormality is seen. Sinuses/Orbits: There is complete opacification with expansion of the right partition of the sphenoid sinus which appears relatively unchanged compared to the prior CT. This may be due to chronic sinusitis but clinical correlation is recommended. This opacification of the right sphenoid sinus measures 51 HU which could be due to proteinaceous debris although a soft tissue lesion cannot be excluded. If further  assessment is warranted, MR would be recommended. Other: None IMPRESSION: 1. Stable moderate atrophy and moderate to severe small vessel ischemic change throughout the periventricular white matter. 2. Small low-attenuation structure in the anterior limb of the left internal capsule not definitely seen previously. Consider acute or subacute lacunar infarct. 3. Complete opacification of the right partition of the sphenoid sinus may be due to sinusitis possibly chronic but soft tissue lesion cannot be excluded as noted above. Electronically Signed   By:  Dwyane Dee M.D.   On: 11/13/2015 10:31   Dg Chest Port 1 View  Result Date: 11/26/2015 CLINICAL DATA:  Sepsis.  Altered mental status today. EXAM: PORTABLE CHEST 1 VIEW COMPARISON:  PA and lateral chest 11/18/2015 and 10/07/2012. Single view of the chest 11/13/2015. FINDINGS: Interstitial edema seen on the most recent examination has resolved. Mild left basilar atelectasis is noted. The lungs are otherwise clear. Heart size is upper normal. Aortic atherosclerosis is seen. The patient is status post CABG. IMPRESSION: Subsegmental atelectasis left lung base. Interstitial edema on the most recent examination has resolved. Atherosclerosis. Electronically Signed   By: Drusilla Kanner M.D.   On: 11/26/2015 18:22   Dg Chest Port 1 View  Result Date: 11/13/2015 CLINICAL DATA:  Altered mental status.  Possible sepsis. EXAM: PORTABLE CHEST 1 VIEW COMPARISON:  10/07/2012 . FINDINGS: Prior CABG. Heart size stable. Bibasilar infiltrates left side greater than right noted these findings consist with pneumonia. Component basilar atelectasis noted. Small left pleural effusion. IMPRESSION: 1. Bibasilar pneumonia left side greater than right. Associated bibasilar subsegmental atelectasis. Small left pleural effusion. 2. Prior CABG.  Heart size stable.  No pulmonary venous congestion . Electronically Signed   By: Maisie Fus  Register   On: 11/13/2015 09:39    ASSESSMENT/PLAN:  Distended abdomen - no complaints of abdominal pain; repeat KUB  Constipation - start Senna-S 8.6-50 mg 2 tabs PO BID, Miralax 17 gm PO BID and Lactulose 10 gm/15 ml give 45 ml PO BID X 3 days then BID PRN  Acute on chronic CKD, stage 4 -  Discontinue Lasix for now and repeat BMP on 12/13/15; follow-up with nephrologist Lab Results  Component Value Date   CREATININE 3.1 (A) 12/10/2015   Chronic systolic CHF - no SOB; discontinue Lasix for now and check BMP on 12/13/15; weigh daily; start Torsemide 10 mg 1 tab PO Q D  Leukocytosis -   Recently finished antibiotic course for cystitis; awaiting UA CS result ; repeat CBC on 12/13/15 Lab Results  Component Value Date   WBC 11.2 12/10/2015        Kenard Gower, NP [employer] [phone]

## 2015-12-17 LAB — BASIC METABOLIC PANEL
BUN: 63 mg/dL — AB (ref 4–21)
CREATININE: 3.4 mg/dL — AB (ref 0.5–1.1)
GLUCOSE: 139 mg/dL
POTASSIUM: 4.6 mmol/L (ref 3.4–5.3)
Sodium: 140 mmol/L (ref 137–147)

## 2015-12-17 LAB — CBC AND DIFFERENTIAL
HCT: 33 % — AB (ref 36–46)
HEMOGLOBIN: 10.1 g/dL — AB (ref 12.0–16.0)
Neutrophils Absolute: 7 /uL
PLATELETS: 315 10*3/uL (ref 150–399)
WBC: 10.6 10*3/mL

## 2015-12-20 ENCOUNTER — Other Ambulatory Visit: Payer: Self-pay

## 2015-12-20 MED ORDER — TRAMADOL HCL 50 MG PO TABS: 25.0000 mg | ORAL_TABLET | Freq: Four times a day (QID) | ORAL | 0 refills | Status: DC | PRN

## 2015-12-20 NOTE — Telephone Encounter (Signed)
Prescription request was received from:  [facility] [address] [place] [address]  Phone: [phone]  Fax: [phone]  

## 2015-12-27 ENCOUNTER — Ambulatory Visit (INDEPENDENT_AMBULATORY_CARE_PROVIDER_SITE_OTHER): Payer: Medicare Other | Admitting: Cardiology

## 2015-12-27 ENCOUNTER — Encounter: Payer: Self-pay | Admitting: Cardiology

## 2015-12-27 DIAGNOSIS — I480 Paroxysmal atrial fibrillation: Secondary | ICD-10-CM | POA: Diagnosis not present

## 2015-12-27 DIAGNOSIS — Z951 Presence of aortocoronary bypass graft: Secondary | ICD-10-CM | POA: Diagnosis not present

## 2015-12-27 DIAGNOSIS — I420 Dilated cardiomyopathy: Secondary | ICD-10-CM

## 2015-12-27 DIAGNOSIS — I429 Cardiomyopathy, unspecified: Secondary | ICD-10-CM | POA: Insufficient documentation

## 2015-12-27 DIAGNOSIS — I10 Essential (primary) hypertension: Secondary | ICD-10-CM | POA: Diagnosis not present

## 2015-12-27 MED ORDER — AMIODARONE HCL 200 MG PO TABS: 200.0000 mg | ORAL_TABLET | Freq: Every day | ORAL | Status: DC

## 2015-12-27 MED ORDER — DILTIAZEM HCL ER COATED BEADS 120 MG PO CP24: 120.0000 mg | ORAL_CAPSULE | Freq: Every day | ORAL | Status: DC

## 2015-12-27 NOTE — Assessment & Plan Note (Signed)
SCr is up to > 3. She is off diuretics. Being followed  By her PCP

## 2015-12-27 NOTE — Patient Instructions (Signed)
Medication Instructions:  DECREASE AMIODARONE 200MG  DAILY AND DECREASE DILTIAZEM 120MG  DAILY   Labwork: NONE   Follow-Up: Your physician recommends that you schedule a follow-up appointment in: WITH DR HILTY IN 6 MONTHS     If you need a refill on your cardiac medications before your next appointment, please call your pharmacy.

## 2015-12-27 NOTE — Assessment & Plan Note (Signed)
EF down to 35% Sept 2017

## 2015-12-27 NOTE — Assessment & Plan Note (Signed)
1998 

## 2015-12-27 NOTE — Assessment & Plan Note (Signed)
Pt noted to have AF with RVR when hospitalized for a UTI in Oct 2017. Amiodarone and Diltiazem added. Not a candidate for anticoagulation secondary to h/o GI bleeding

## 2015-12-27 NOTE — Progress Notes (Signed)
12/27/2015 [name]   03-17-1916  [id number]  Primary Physician Murray HodgkinsArthur Green, MD Primary Cardiologist: Dr Rennis GoldenHilty  HPI:  82 y/o female with a history of remote CABG in 1998 followed by Dr Rennis GoldenHilty. Nuclear study in 2012 was low risk. Echo in Sept 2017 showed her EF to be 35% with global HK, down from 45% June 2014. The pt was admitted to the hospital in Oct 2017 with a UTI. She was noted to be in AF with RVR on 11/26/15. Amiodarone and Diltiazem added. EKG on 11/28/15 showed NSR at 85. After that hospitalization she went to [facility] for rehab. She is in the office today for follow up. Her son accompanied her. He says she has been able to participate in rehab without syncope. She is frail and in a wheel chair. Her SCr has been drifting up and her Lasix has been stopped. In the office her systolic B/P was in the 90's, HR 80's. She denies an chest pain or palpitations.    Current Outpatient Prescriptions  Medication Sig Dispense Refill  . amiodarone (PACERONE) 200 MG tablet Take 1 tablet (200 mg total) by mouth daily. 1 TABLET DAILY    . aspirin EC 81 MG tablet Take 1 tablet (81 mg total) by mouth daily. 30 tablet 0  . atorvastatin (LIPITOR) 20 MG tablet Take one tablet by mouth once daily to lower cholesterol 90 tablet 1  . calcitonin, salmon, (MIACALCIN/FORTICAL) 200 UNIT/ACT nasal spray INSTILL 1 SPRAY INTO ALTERNATING NOSTRILS ONCE A DAY FOR OSTEOPROSIS 3.7 mL 5  . dextromethorphan-guaiFENesin (MUCINEX DM) 30-600 MG per 12 hr tablet Take 1 tablet by mouth every 12 (twelve) hours as needed for cough.     . diltiazem (CARDIZEM CD) 120 MG 24 hr capsule Take 1 capsule (120 mg total) by mouth daily.    . furosemide (LASIX) 20 MG tablet Take 1 tablet (20 mg total) by mouth daily.    Marland Kitchen. ipratropium-albuterol (DUONEB) 0.5-2.5 (3) MG/3ML SOLN Take 3 mLs by nebulization 3 (three) times daily.    Marland Kitchen. latanoprost (XALATAN) 0.005 % ophthalmic solution Place 1 drop into the left eye at bedtime.     Marland Kitchen.  levothyroxine (SYNTHROID, LEVOTHROID) 150 MCG tablet Take 150 mcg by mouth daily before breakfast.    . Multiple Vitamins-Minerals (CENTRUM SILVER PO) Take 1 tablet by mouth daily.    Marland Kitchen. omeprazole (PRILOSEC) 20 MG capsule Take 20 mg by mouth daily.     Marland Kitchen. oxyCODONE (ROXICODONE) 5 MG immediate release tablet Take 1 tablet (5 mg total) by mouth every 8 (eight) hours as needed for severe pain. 20 tablet 0  . polyethylene glycol (MIRALAX / GLYCOLAX) packet Take 17 g by mouth daily. 14 each 0  . pregabalin (LYRICA) 75 MG capsule Take 1 capsule (75 mg total) by mouth 2 (two) times daily. Take one capsule by mouth twice daily as needed for pain and nerves    . saccharomyces boulardii (FLORASTOR) 250 MG capsule Take 1 capsule (250 mg total) by mouth 2 (two) times daily. 20 capsule 0  . traMADol (ULTRAM) 50 MG tablet Take 0.5 tablets (25 mg total) by mouth every 6 (six) hours as needed for moderate pain. 30 tablet 0  . Trospium Chloride 60 MG CP24 Take one capsule by mouth once daily for bladder control 30 capsule 5   No current facility-administered medications for this visit.     Allergies  Allergen Reactions  . Cosopt [Dorzolamide Hcl-Timolol Mal] Other (See Comments)  Redness and burning in both eyes  . Noroxin [Norfloxacin] Swelling  . Sulfa Antibiotics Nausea And Vomiting  . Prozac [Fluoxetine Hcl] Rash    Social History   Social History  . Marital status: Widowed    Spouse name: N/A  . Number of children: 3  . Years of education: N/A   Occupational History  . 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 Smoking status: Never Smoker  . Smokeless tobacco: Never Used  . Alcohol use Yes     Comment: occasional wine  . Drug use: No  . Sexual activity: No   Other Topics Concern  . Not on file   Social History Narrative   Lives with son Chester HolsteinDavid   Widowed   Never smoked   Alcohol none     Review of Systems: General: negative for chills,  fever, night sweats or weight changes.  Cardiovascular: negative for chest pain, dyspnea on exertion, edema, orthopnea, palpitations, paroxysmal nocturnal dyspnea or shortness of breath Dermatological: negative for rash Respiratory: negative for cough or wheezing Urologic: negative for hematuria Abdominal: negative for nausea, vomiting, diarrhea, bright red blood per rectum, melena, or hematemesis Neurologic: negative for visual changes, syncope, or dizziness All other systems reviewed and are otherwise negative except as noted above.    Blood pressure (!) 92/58, pulse 77, height 5' (1.524 m), weight 158 lb 14.4 oz (72.1 kg).  General appearance: alert, cooperative, no distress, moderately obese and on O2, in wheel chair Neck: no JVD CV: irregularly irregular rythm Lungs: clear to auscultation bilaterally Extremities: 1+ edema on RLE, trace on Lt Skin: pale, cool, dry Neurologic: Grossly normal   ASSESSMENT AND PLAN:   PAF (paroxysmal atrial fibrillation) (HCC) Pt noted to have AF with RVR when hospitalized for a UTI in Oct 2017. Amiodarone and Diltiazem added. Not a candidate for anticoagulation secondary to h/o GI bleeding. She is in AF today on exam.  Acute on chronic renal insufficiency SCr is up to > 3. She is off diuretics. Being followed  By her PCP  HTN (hypertension) B/P now low  Hx of CABG 1998  Cardiomyopathy -etiology not clear-? AF vs ischemic EF down to 35% Sept 2017   PLAN  I decreased her Amiodarone to 200 mg daily and her Diltiazem to 120 mg daily. She is not a candidate for cardioversion, plan is for rate control. Unable to anticoagulate secondary to history of GI bleeding, AVMs. F/U Dr Rennis GoldenHilty in 6 months.   Corine ShelterLuke Domnick Chervenak PA-C 12/27/2015 3:45 PM

## 2015-12-27 NOTE — Assessment & Plan Note (Signed)
B/P now low

## 2015-12-28 ENCOUNTER — Ambulatory Visit: Payer: Medicare Other | Admitting: Physician Assistant

## 2015-12-31 LAB — BASIC METABOLIC PANEL
BUN: 31 mg/dL — AB (ref 4–21)
Creatinine: 1.7 mg/dL — AB (ref 0.5–1.1)
Glucose: 133 mg/dL
POTASSIUM: 4.4 mmol/L (ref 3.4–5.3)
SODIUM: 145 mmol/L (ref 137–147)

## 2016-01-02 ENCOUNTER — Encounter: Payer: Self-pay | Admitting: Adult Health

## 2016-01-02 ENCOUNTER — Non-Acute Institutional Stay (SKILLED_NURSING_FACILITY): Payer: Medicare Other | Admitting: Adult Health

## 2016-01-02 DIAGNOSIS — N3281 Overactive bladder: Secondary | ICD-10-CM

## 2016-01-02 DIAGNOSIS — R1310 Dysphagia, unspecified: Secondary | ICD-10-CM

## 2016-01-02 DIAGNOSIS — K2970 Gastritis, unspecified, without bleeding: Secondary | ICD-10-CM | POA: Diagnosis not present

## 2016-01-02 DIAGNOSIS — M792 Neuralgia and neuritis, unspecified: Secondary | ICD-10-CM

## 2016-01-02 DIAGNOSIS — K2990 Gastroduodenitis, unspecified, without bleeding: Secondary | ICD-10-CM

## 2016-01-02 DIAGNOSIS — I5022 Chronic systolic (congestive) heart failure: Secondary | ICD-10-CM | POA: Diagnosis not present

## 2016-01-02 DIAGNOSIS — E039 Hypothyroidism, unspecified: Secondary | ICD-10-CM | POA: Diagnosis not present

## 2016-01-02 DIAGNOSIS — Z8673 Personal history of transient ischemic attack (TIA), and cerebral infarction without residual deficits: Secondary | ICD-10-CM

## 2016-01-02 DIAGNOSIS — E1122 Type 2 diabetes mellitus with diabetic chronic kidney disease: Secondary | ICD-10-CM | POA: Diagnosis not present

## 2016-01-02 DIAGNOSIS — N184 Chronic kidney disease, stage 4 (severe): Secondary | ICD-10-CM

## 2016-01-02 DIAGNOSIS — I480 Paroxysmal atrial fibrillation: Secondary | ICD-10-CM

## 2016-01-02 DIAGNOSIS — R531 Weakness: Secondary | ICD-10-CM | POA: Diagnosis not present

## 2016-01-02 DIAGNOSIS — K5901 Slow transit constipation: Secondary | ICD-10-CM

## 2016-01-02 DIAGNOSIS — D638 Anemia in other chronic diseases classified elsewhere: Secondary | ICD-10-CM | POA: Diagnosis not present

## 2016-01-02 NOTE — Progress Notes (Signed)
Patient ID: [name], female   DOB: Aug 04, 1916, 82 y.o.   MRN: [id number]    DATE:  01/02/2016   MRN:  [id number]  BIRTHDAY: Aug 04, 1916  Facility:  Nursing Home Location:  [facility]  Nursing Home Room Number: 604-P  LEVEL OF CARE:  SNF [phone]  Contact Information    Name Relation Home Work [employer]   Muralles,David C Son [phone]  [phone]       Code Status History    Date Active Date Inactive Code Status Order ID Comments User Context   11/26/2015  9:25 PM 12/03/2015  8:49 PM DNR [id number]  Hillary BowJared M Gardner, DO ED   11/13/2015  2:44 PM 11/21/2015  5:37 PM DNR [id number]  Meredith PelPaula M Guenther, NP ED   11/13/2015 12:29 PM 11/13/2015  2:44 PM DNR [id number]  Simonne MartinetPeter E Babcock, NP ED   11/13/2015 12:00 PM 11/13/2015 12:29 PM DNR [id number]  Gerhard Munchobert Lockwood, MD ED   10/07/2012  8:41 PM 10/10/2012  5:52 PM DNR [id number]  Jerald KiefStephen K Chiu, MD ED   08/14/2012  3:29 PM 08/19/2012  7:11 PM DNR [id number]  Laveda Normanhris N Oti, MD Inpatient    Questions for Most Recent Historical Code Status (Order [id number])    Question Answer Comment   In the event of cardiac or respiratory ARREST Do not call a "code blue"    In the event of cardiac or respiratory ARREST Do not perform Intubation, CPR, defibrillation or ACLS    In the event of cardiac or respiratory ARREST Use medication by any route, position, wound care, and other measures to relive pain and suffering. May use oxygen, suction and manual treatment of airway obstruction as needed for comfort.         Advance Directive Documentation   Flowsheet Row Most Recent Value  Type of Advance Directive  Living will, Out of facility DNR (pink MOST or yellow form), Healthcare Power of Attorney  Pre-existing out of facility DNR order (yellow form or pink MOST form)  No data  "MOST" Form in Place?  No data       Chief Complaint  Patient presents with  . Medical Management of Chronic Issues    HISTORY OF PRESENT ILLNESS:  This is a 82 year old female  who is being seen for a routine visit. She is currently having a short-term rehabilitation. She is being followed-up by palliative care. She was recently noted to have distended abdomen. KUB done showed no bowel obstruction. Latest wbc 10.6, normal. Son was at bedside. She had acute on chronic renal failure. Latest creatinine 1.73 (bumped up to 3.37 2 weeks ago) and GFR 28.96.  [facility] with urosepsis and afib with RVR. She was placed on Cardizem and Amiodarone by cardiology. She was given antibiotic. She was having short-term rehabilitation before being transferred to the hospital. She was previously [facility]15 with aspiration PNA, UTI and CHF exacerbation.   PAST MEDICAL HISTORY:  Past Medical History:  Diagnosis Date  . Allergic rhinitis, cause unspecified   . Anemia   . Arthralgia of temporomandibular joint   . Arthropathy, unspecified, site unspecified   . Atrial fibrillation (HCC)   . Bronchitis, acute   . Carpal tunnel syndrome   . Chest pain, unspecified   . Chronic kidney disease, unspecified   . Closed dislocation, thoracic vertebra   . Closed fracture of lumbar vertebra without mention of spinal  cord injury   . Diabetes type 2, uncontrolled (HCC)   . Diabetes with renal manifestations(250.4)   . Diastasis of muscle   . Eczema   . Gait disorder   . Hematuria, unspecified   . Hemoptysis   . History of nuclear stress test 06/06/2010   lexiscan; normal pattern of perfusion; low risk   . Hyperlipidemia   . Hyperpotassemia   . Hypertension   . Hypothyroid   . Insomnia, unspecified   . Macular degeneration (senile) of retina, unspecified   . Mild memory disturbance   . Myoclonus   . Obesity, unspecified   . Open wound of knee, leg (except thigh), and ankle, without mention of complication   . Osteoporosis   . Other atopic dermatitis and related conditions   . Other disorder of  calcium metabolism   . Other specified idiopathic peripheral neuropathy   . Personal history of fall   . Personality change due to conditions classified elsewhere   . Scoliosis   . Seborrheic keratosis   . Trigger finger (acquired)   . Type II or unspecified type diabetes mellitus without mention of complication, not stated as uncontrolled   . Unspecified closed fracture of pelvis   . Unspecified constipation   . Unspecified glaucoma(365.9)   . Unspecified hereditary and idiopathic peripheral neuropathy   . Unspecified hypertrophic and atrophic condition of skin   . Unspecified pruritic disorder   . Unspecified urinary incontinence   . Unspecified vitamin D deficiency   . Urinary tract infection, site not specified   . Varicose veins of lower extremities with inflammation      CURRENT MEDICATIONS: Reviewed  Patient's Medications  New Prescriptions   No medications on file  Previous Medications   ACETAMINOPHEN (TYLENOL) 650 MG CR TABLET    Take 650 mg by mouth every 6 (six) hours as needed for pain.   ACETAMINOPHEN (TYLENOL) 650 MG SUPPOSITORY    Place 650 mg rectally every 4 (four) hours as needed for fever.   AMIODARONE (PACERONE) 200 MG TABLET    Take 1 tablet (200 mg total) by mouth daily. 1 TABLET DAILY   ASPIRIN EC 81 MG TABLET    Take 1 tablet (81 mg total) by mouth daily.   BISACODYL (DULCOLAX) 10 MG SUPPOSITORY    Place 10 mg rectally every 3 (three) days.   CALCITONIN, SALMON, (MIACALCIN/FORTICAL) 200 UNIT/ACT NASAL SPRAY    INSTILL 1 SPRAY INTO ALTERNATING NOSTRILS ONCE A DAY FOR OSTEOPROSIS   DEXTROMETHORPHAN-GUAIFENESIN (MUCINEX DM) 30-600 MG PER 12 HR TABLET    Take 1 tablet by mouth every 12 (twelve) hours as needed for cough.    DILTIAZEM (CARDIZEM CD) 120 MG 24 HR CAPSULE    Take 1 capsule (120 mg total) by mouth daily.   IPRATROPIUM-ALBUTEROL (DUONEB) 0.5-2.5 (3) MG/3ML SOLN    Take 3 mLs by nebulization 3 (three) times daily.   LACTULOSE (CHRONULAC) 10 GM/15ML  SOLUTION    Take 45 g by mouth 2 (two) times daily as needed for mild constipation.   LATANOPROST (XALATAN) 0.005 % OPHTHALMIC SOLUTION    Place 1 drop into the left eye at bedtime.    LEVOTHYROXINE (SYNTHROID, LEVOTHROID) 150 MCG TABLET    Take 150 mcg by mouth daily before breakfast.   MULTIPLE VITAMINS-MINERALS (CENTRUM SILVER PO)    Take 1 tablet by mouth daily.   OMEPRAZOLE (PRILOSEC) 20 MG CAPSULE    Take 20 mg by mouth daily.    PREGABALIN (LYRICA) 75 MG  CAPSULE    Take 1 capsule (75 mg total) by mouth 2 (two) times daily. Take one capsule by mouth twice daily as needed for pain and nerves   SENNOSIDES-DOCUSATE SODIUM (SENOKOT-S) 8.6-50 MG TABLET    Take 2 tablets by mouth at bedtime.   TORSEMIDE (DEMADEX) 10 MG TABLET    Take 10 mg by mouth daily.   TRAMADOL (ULTRAM) 50 MG TABLET    Take 0.5 tablets (25 mg total) by mouth every 6 (six) hours as needed for moderate pain.   TROSPIUM CHLORIDE 60 MG CP24    Take one capsule by mouth once daily for bladder control  Modified Medications   No medications on file  Discontinued Medications   ATORVASTATIN (LIPITOR) 20 MG TABLET    Take one tablet by mouth once daily to lower cholesterol   FUROSEMIDE (LASIX) 20 MG TABLET    Take 1 tablet (20 mg total) by mouth daily.   OXYCODONE (ROXICODONE) 5 MG IMMEDIATE RELEASE TABLET    Take 1 tablet (5 mg total) by mouth every 8 (eight) hours as needed for severe pain.   POLYETHYLENE GLYCOL (MIRALAX / GLYCOLAX) PACKET    Take 17 g by mouth daily.   SACCHAROMYCES BOULARDII (FLORASTOR) 250 MG CAPSULE    Take 1 capsule (250 mg total) by mouth 2 (two) times daily.     Allergies  Allergen Reactions  . Cosopt [Dorzolamide Hcl-Timolol Mal] Other (See Comments)    Redness and burning in both eyes  . Noroxin [Norfloxacin] Swelling  . Sulfa Antibiotics Nausea And Vomiting  . Prozac [Fluoxetine Hcl] Rash     REVIEW OF SYSTEMS:  GENERAL: no change in appetite, no fatigue, no weight changes, no fever, chills or  weakness EYES: Denies change in vision, dry eyes, eye pain, itching or discharge EARS: Denies change in hearing, ringing in ears, or earache NOSE: Denies nasal congestion or epistaxis MOUTH and THROAT: Denies oral discomfort, gingival pain or bleeding, pain from teeth or hoarseness   RESPIRATORY: no cough, SOB, DOE, wheezing, hemoptysis CARDIAC: no chest pain, edema or palpitations GI: no nausea or vomiting GU: Denies dysuria, frequency, hematuria, incontinence, or discharge PSYCHIATRIC: Denies feeling of depression or anxiety. No report of hallucinations, insomnia, paranoia, or agitation    PHYSICAL EXAMINATION  GENERAL APPEARANCE: Well nourished. In no acute distress. Obese SKIN:  Skin is warm and dry.  HEAD: Normal in size and contour. No evidence of trauma EYES: Lids open and close normally. No blepharitis, entropion or ectropion. PERRL. Conjunctivae are clear and sclerae are white. Lenses are without opacity EARS: Pinnae are normal. Patient hears normal voice tunes of the examiner MOUTH and THROAT: Lips are without lesions. Oral mucosa is moist and without lesions. Tongue is normal in shape, size, and color and without lesions NECK: supple, trachea midline, no neck masses, no thyroid tenderness, no thyromegaly LYMPHATICS: no LAN in the neck, no supraclavicular LAN RESPIRATORY: breathing is even & unlabored, BS CTAB CARDIAC: irregularly irregular, no murmur,no extra heart sounds, no edema GI: distended abdomen, +normoactive bowel sounds EXTREMITIES:  Able to move X 4 extremities PSYCHIATRIC: Alert and oriented to place and person, disoriented to time. Affect and behavior are appropriate  LABS/RADIOLOGY: Labs reviewed: 12/31/15  NA 145  K 4.4  Glucose 133  BUN 31  Creatinine 1.73  Ca 10.4  GFR 28.96 Basic Metabolic Panel:  Recent Labs  21/30/86 1636  11/28/15 0318 11/29/15 0330 12/01/15 0825 12/07/15 12/10/15 12/17/15  NA 140  < > 143 142  140 134* 136* 140  K 4.3  < > 4.0  3.6 3.5 4.2 4.4 4.6  CL 113*  < > 114* 111 105  --   --   --   CO2 18*  < > 21* 23 24  --   --   --   GLUCOSE 133*  < > 193* 136* 198*  --   --   --   BUN 37*  < > 26* 25* 21* 36* 49* 63*  CREATININE 2.10*  < > 1.94* 1.95* 1.92* 3.2* 3.1* 3.4*  CALCIUM 9.3  < > 9.5 10.1 10.3  --   --   --   MG 1.7  --   --   --   --   --   --   --   < > = values in this interval not displayed. Liver Function Tests:  Recent Labs  11/13/15 0910 11/19/15 0654 11/26/15 11/26/15 1741 12/10/15  AST 20 21 13 17 15   ALT 13* 22 11 13* 8  ALKPHOS 96 78 96 79 92  BILITOT 0.7 0.9  --  0.7  --   PROT 6.3* 6.2*  --  6.7  --   ALBUMIN 3.2* 2.7*  --  3.1*  --    CBC:  Recent Labs  11/28/15 0318 11/29/15 0330 12/01/15 0825 12/07/15 12/10/15 12/17/15  WBC 8.4 9.1 10.0 14.2 11.2 10.6  NEUTROABS  --   --  6.7 10,934 8 7  HGB 8.8* 9.0* 10.3* 9.0* 9.5* 10.1*  HCT 27.6* 28.5* 31.4* 28* 31* 33*  MCV 99.3 98.6 93.5  --   --   --   PLT 287 316 347 440* 417* 315   Lipid Panel:  Recent Labs  09/07/15 1520  HDL 38*   Cardiac Enzymes:  Recent Labs  11/18/15 1933 11/19/15 0105 11/19/15 0654  TROPONINI 0.48* 0.51* 0.49*   CBG:  Recent Labs  12/03/15 0750 12/03/15 1125 12/03/15 1641  GLUCAP 173* 223* 223*        ASSESSMENT/PLAN:  Generalized weakness - continue rehabilitation, PT and OT; for therapeutic strengthening exercises; fall precaution  PAF - rate-controlled - recently Amiodarone was decreased to 200 mg Q D and Diltiazem 12 hr 120 mg PO Q D  Dysphagia - continue with ST treatments; aspiration precaution  Constipation - continue Senna-S 8.6-50 mg 2 tabs PO Q HS and Lactulose 10 gm/15 ml give 45 ml PO BID  PRN  CKD, stage 4 -   Creatinine 1.73, GFR 28.96; follow-up with nephrologist; stable  Chronic systolic CHF - no SOB;  Lasix was discontinued and changed to Torsemide 10 mg Q D; weigh daily  Gastritis - continue Omeprazole 20 mg 1 capsule PO Q D  OAB - continue Trospium  chloride 60 mg 1 capsule PO Q D  Hypothyroidism - continue Synthroid 150 mcg daily   Anemia of chronic disease - hgb 10.1; stable  History of CVA - continue ASA EC 81 mg daily  Neuropathy - continue Lyrica 75 mg 1 capsule PO BID and BID PRN     Goals of care:  Short-term rehabilitation    Kenard Gower, NP [facility] [phone]

## 2016-01-10 ENCOUNTER — Non-Acute Institutional Stay (SKILLED_NURSING_FACILITY): Payer: Medicare Other | Admitting: Adult Health

## 2016-01-10 ENCOUNTER — Encounter: Payer: Self-pay | Admitting: Adult Health

## 2016-01-10 DIAGNOSIS — K2990 Gastroduodenitis, unspecified, without bleeding: Secondary | ICD-10-CM

## 2016-01-10 DIAGNOSIS — K5901 Slow transit constipation: Secondary | ICD-10-CM | POA: Diagnosis not present

## 2016-01-10 DIAGNOSIS — D638 Anemia in other chronic diseases classified elsewhere: Secondary | ICD-10-CM

## 2016-01-10 DIAGNOSIS — I5022 Chronic systolic (congestive) heart failure: Secondary | ICD-10-CM | POA: Diagnosis not present

## 2016-01-10 DIAGNOSIS — E1122 Type 2 diabetes mellitus with diabetic chronic kidney disease: Secondary | ICD-10-CM | POA: Diagnosis not present

## 2016-01-10 DIAGNOSIS — N3281 Overactive bladder: Secondary | ICD-10-CM | POA: Diagnosis not present

## 2016-01-10 DIAGNOSIS — M792 Neuralgia and neuritis, unspecified: Secondary | ICD-10-CM

## 2016-01-10 DIAGNOSIS — N184 Chronic kidney disease, stage 4 (severe): Secondary | ICD-10-CM

## 2016-01-10 DIAGNOSIS — K2970 Gastritis, unspecified, without bleeding: Secondary | ICD-10-CM | POA: Diagnosis not present

## 2016-01-10 DIAGNOSIS — Z8673 Personal history of transient ischemic attack (TIA), and cerebral infarction without residual deficits: Secondary | ICD-10-CM | POA: Diagnosis not present

## 2016-01-10 DIAGNOSIS — R1310 Dysphagia, unspecified: Secondary | ICD-10-CM | POA: Diagnosis not present

## 2016-01-10 DIAGNOSIS — R531 Weakness: Secondary | ICD-10-CM

## 2016-01-10 DIAGNOSIS — I480 Paroxysmal atrial fibrillation: Secondary | ICD-10-CM | POA: Diagnosis not present

## 2016-01-10 DIAGNOSIS — E039 Hypothyroidism, unspecified: Secondary | ICD-10-CM

## 2016-01-10 NOTE — Progress Notes (Signed)
Patient ID: [name], female   DOB: 1916-05-11, 82 y.o.   MRN: [id number]    DATE:  01/10/2016   MRN:  [id number]  BIRTHDAY: 03/23/1916  Facility:  Nursing Home Location:  [facility]  Nursing Home Room Number: 604-P  LEVEL OF CARE:  SNF [id number])  Contact Information    Name Relation Home Work [place] [phone]  [phone]       Code Status History    Date Active Date Inactive Code Status Order ID Comments User Context   11/26/2015  9:25 PM 12/03/2015  8:49 PM DNR [id number]  Hillary Bow, DO ED   11/13/2015  2:44 PM 11/21/2015  5:37 PM DNR [id number]  Meredith Pel, NP ED   11/13/2015 12:29 PM 11/13/2015  2:44 PM DNR [id number]  Simonne Martinet, NP ED   11/13/2015 12:00 PM 11/13/2015 12:29 PM DNR [id number]  Gerhard Munch, MD ED   10/07/2012  8:41 PM 10/10/2012  5:52 PM DNR [id number]  Jerald Kief, MD ED   08/14/2012  3:29 PM 08/19/2012  7:11 PM DNR [id number]  Laveda Norman, MD Inpatient    Questions for Most Recent Historical Code Status (Order [id number])    Question Answer Comment   In the event of cardiac or respiratory ARREST Do not call a "code blue"    In the event of cardiac or respiratory ARREST Do not perform Intubation, CPR, defibrillation or ACLS    In the event of cardiac or respiratory ARREST Use medication by any route, position, wound care, and other measures to relive pain and suffering. May use oxygen, suction and manual treatment of airway obstruction as needed for comfort.         Advance Directive Documentation   Flowsheet Row Most Recent Value  Type of Advance Directive  Living will, Out of facility DNR (pink MOST or yellow form), Healthcare Power of Attorney  Pre-existing out of facility DNR order (yellow form or pink MOST form)  No data  "MOST" Form in Place?  No data       Chief Complaint  Patient presents with  . Discharge Note    HISTORY OF PRESENT ILLNESS:  This is a 82 year old female who Is for discharge  home with home health CNA, PT, OT, nursing and speech therapy. DME:  O2 @ 2L/min via [place] continuously, portable (gas) and stationary .  During her stay @ [facility], her creatinine bumped up to 3.37 and latest is 1.73. She was followed-up by Palliative Care.  She has been admitted to [facility] on 12/03/15 from [facility] with urosepsis and afib with RVR. She was placed on Cardizem and Amiodarone by cardiology. She was given antibiotic. She was having short-term rehabilitation before being transferred to the hospital. She was previously admitted to [facility] on 11/21/15 with aspiration PNA, UTI and CHF exacerbation.  Patient was admitted to this facility for short-term rehabilitation after the patient's recent hospitalization.  Patient has completed SNF rehabilitation and therapy has cleared the patient for discharge.   PAST MEDICAL HISTORY:  Past Medical History:  Diagnosis Date  . Allergic rhinitis, cause unspecified   . Anemia   . Arthralgia of temporomandibular joint   . Arthropathy, unspecified, site unspecified   . Atrial fibrillation (HCC)   . Bronchitis, acute   . Carpal tunnel syndrome   . Chest pain, unspecified   . Chronic kidney disease, unspecified   . Closed dislocation, thoracic  vertebra   . Closed fracture of lumbar vertebra without mention of spinal cord injury   . Diabetes type 2, uncontrolled (HCC)   . Diabetes with renal manifestations(250.4)   . Diastasis of muscle   . Eczema   . Gait disorder   . Hematuria, unspecified   . Hemoptysis   . History of nuclear stress test 06/06/2010   lexiscan; normal pattern of perfusion; low risk   . Hyperlipidemia   . Hyperpotassemia   . Hypertension   . Hypothyroid   . Insomnia, unspecified   . Macular degeneration (senile) of retina, unspecified   . Mild memory disturbance   . Myoclonus   . Obesity, unspecified   . Open wound of knee, leg (except thigh), and ankle, without mention of complication   .  Osteoporosis   . Other atopic dermatitis and related conditions   . Other disorder of calcium metabolism   . Other specified idiopathic peripheral neuropathy   . Personal history of fall   . Personality change due to conditions classified elsewhere   . Scoliosis   . Seborrheic keratosis   . Trigger finger (acquired)   . Type II or unspecified type diabetes mellitus without mention of complication, not stated as uncontrolled   . Unspecified closed fracture of pelvis   . Unspecified constipation   . Unspecified glaucoma(365.9)   . Unspecified hereditary and idiopathic peripheral neuropathy   . Unspecified hypertrophic and atrophic condition of skin   . Unspecified pruritic disorder   . Unspecified urinary incontinence   . Unspecified vitamin D deficiency   . Urinary tract infection, site not specified   . Varicose veins of lower extremities with inflammation      CURRENT MEDICATIONS: Reviewed  Patient's Medications  New Prescriptions   No medications on file  Previous Medications   ACETAMINOPHEN (TYLENOL) 650 MG CR TABLET    Take 650 mg by mouth every 6 (six) hours as needed for pain.   ACETAMINOPHEN (TYLENOL) 650 MG SUPPOSITORY    Place 650 mg rectally every 4 (four) hours as needed for fever.   AMIODARONE (PACERONE) 200 MG TABLET    Take 1 tablet (200 mg total) by mouth daily. 1 TABLET DAILY   ASPIRIN EC 81 MG TABLET    Take 1 tablet (81 mg total) by mouth daily.   BISACODYL (DULCOLAX) 10 MG SUPPOSITORY    Place 10 mg rectally every 3 (three) days.   CALCITONIN, SALMON, (MIACALCIN/FORTICAL) 200 UNIT/ACT NASAL SPRAY    INSTILL 1 SPRAY INTO ALTERNATING NOSTRILS ONCE A DAY FOR OSTEOPROSIS   DEXTROMETHORPHAN-GUAIFENESIN (MUCINEX DM) 30-600 MG PER 12 HR TABLET    Take 1 tablet by mouth every 12 (twelve) hours as needed for cough.    DILTIAZEM (CARDIZEM CD) 120 MG 24 HR CAPSULE    Take 1 capsule (120 mg total) by mouth daily.   IPRATROPIUM-ALBUTEROL (DUONEB) 0.5-2.5 (3) MG/3ML SOLN     Take 3 mLs by nebulization 3 (three) times daily.   LACTULOSE (CHRONULAC) 10 GM/15ML SOLUTION    Take 45 g by mouth 2 (two) times daily as needed for mild constipation.   LATANOPROST (XALATAN) 0.005 % OPHTHALMIC SOLUTION    Place 1 drop into the left eye at bedtime.    LEVOTHYROXINE (SYNTHROID, LEVOTHROID) 150 MCG TABLET    Take 150 mcg by mouth daily before breakfast.   MULTIPLE VITAMINS-MINERALS (CENTRUM SILVER PO)    Take 1 tablet by mouth daily.   OMEPRAZOLE (PRILOSEC) 20 MG CAPSULE  Take 20 mg by mouth daily.    PREGABALIN (LYRICA) 75 MG CAPSULE    Take 1 capsule (75 mg total) by mouth 2 (two) times daily. Take one capsule by mouth twice daily as needed for pain and nerves   SENNOSIDES-DOCUSATE SODIUM (SENOKOT-S) 8.6-50 MG TABLET    Take 2 tablets by mouth at bedtime.   TORSEMIDE (DEMADEX) 10 MG TABLET    Take 10 mg by mouth daily.   TRAMADOL (ULTRAM) 50 MG TABLET    Take 0.5 tablets (25 mg total) by mouth every 6 (six) hours as needed for moderate pain.   TROSPIUM CHLORIDE 60 MG CP24    Take one capsule by mouth once daily for bladder control  Modified Medications   No medications on file  Discontinued Medications   No medications on file     Allergies  Allergen Reactions  . Cosopt [Dorzolamide Hcl-Timolol Mal] Other (See Comments)    Redness and burning in both eyes  . Noroxin [Norfloxacin] Swelling  . Sulfa Antibiotics Nausea And Vomiting  . Prozac [Fluoxetine Hcl] Rash     REVIEW OF SYSTEMS:  GENERAL: no change in appetite, no fatigue, no weight changes, no fever, chills or weakness EYES: Denies change in vision, dry eyes, eye pain, itching or discharge EARS: Denies change in hearing, ringing in ears, or earache NOSE: Denies nasal congestion or epistaxis MOUTH and THROAT: Denies oral discomfort, gingival pain or bleeding, pain from teeth or hoarseness   RESPIRATORY: no cough, SOB, DOE, wheezing, hemoptysis CARDIAC: no chest pain, edema or palpitations GI: no nausea or  vomiting GU: Denies dysuria, frequency, hematuria, incontinence, or discharge PSYCHIATRIC: Denies feeling of depression or anxiety. No report of hallucinations, insomnia, paranoia, or agitation    PHYSICAL EXAMINATION  GENERAL APPEARANCE: Well nourished. In no acute distress. Obese SKIN:  Skin is warm and dry.  HEAD: Normal in size and contour. No evidence of trauma EYES: Lids open and close normally. No blepharitis, entropion or ectropion. PERRL. Conjunctivae are clear and sclerae are white. Lenses are without opacity EARS: Pinnae are normal. Patient hears normal voice tunes of the examiner MOUTH and THROAT: Lips are without lesions. Oral mucosa is moist and without lesions. Tongue is normal in shape, size, and color and without lesions NECK: supple, trachea midline, no neck masses, no thyroid tenderness, no thyromegaly LYMPHATICS: no LAN in the neck, no supraclavicular LAN RESPIRATORY: breathing is even & unlabored, BS CTAB CARDIAC: irregularly irregular, no murmur,no extra heart sounds, no edema GI: distended abdomen, +normoactive bowel sounds EXTREMITIES:  Able to move X 4 extremities PSYCHIATRIC: Alert and oriented to place and person, disoriented to time. Affect and behavior are appropriate  LABS/RADIOLOGY: Labs reviewed: 12/31/15  NA 145  K 4.4  Glucose 133  BUN 31  Creatinine 1.73  Ca 10.4  GFR 28.96 Basic Metabolic Panel:  Recent Labs  16/11/9607/21/17 1636  11/28/15 0318 11/29/15 0330 12/01/15 0825  12/10/15 12/17/15 12/31/15  NA 140  < > 143 142 140  < > 136* 140 145  K 4.3  < > 4.0 3.6 3.5  < > 4.4 4.6 4.4  CL 113*  < > 114* 111 105  --   --   --   --   CO2 18*  < > 21* 23 24  --   --   --   --   GLUCOSE 133*  < > 193* 136* 198*  --   --   --   --   BUN 37*  < >  26* 25* 21*  < > 49* 63* 31*  CREATININE 2.10*  < > 1.94* 1.95* 1.92*  < > 3.1* 3.4* 1.7*  CALCIUM 9.3  < > 9.5 10.1 10.3  --   --   --   --   MG 1.7  --   --   --   --   --   --   --   --   < > = values in this  interval not displayed. Liver Function Tests:  Recent Labs  11/13/15 0910 11/19/15 0654 11/26/15 11/26/15 1741 12/10/15  AST 20 21 13 17 15   ALT 13* 22 11 13* 8  ALKPHOS 96 78 96 79 92  BILITOT 0.7 0.9  --  0.7  --   PROT 6.3* 6.2*  --  6.7  --   ALBUMIN 3.2* 2.7*  --  3.1*  --    CBC:  Recent Labs  11/28/15 0318 11/29/15 0330 12/01/15 0825 12/07/15 12/10/15 12/17/15  WBC 8.4 9.1 10.0 14.2 11.2 10.6  NEUTROABS  --   --  6.7 10,934 8 7  HGB 8.8* 9.0* 10.3* 9.0* 9.5* 10.1*  HCT 27.6* 28.5* 31.4* 28* 31* 33*  MCV 99.3 98.6 93.5  --   --   --   PLT 287 316 347 440* 417* 315   Lipid Panel:  Recent Labs  09/07/15 1520  HDL 38*   Cardiac Enzymes:  Recent Labs  11/18/15 1933 11/19/15 0105 11/19/15 0654  TROPONINI 0.48* 0.51* 0.49*   CBG:  Recent Labs  12/03/15 0750 12/03/15 1125 12/03/15 1641  GLUCAP 173* 223* 223*        ASSESSMENT/PLAN:  Generalized weakness - for Home health CNA, ST, Nursing, PT and OT; for therapeutic strengthening exercises; fall precaution  PAF - rate-controlled - continue Amiodarone was decreased to 200 mg Q D and Diltiazem 12 hr 120 mg PO Q D  Dysphagia - for Home health ST treatments for safe swallowing; aspiration precaution  Constipation - continue Senna-S 8.6-50 mg 2 tabs PO Q HS and Lactulose 10 gm/15 ml give 45 ml PO BID  PRN  CKD, stage 4 -   Creatinine 1.73, GFR 28.96; follow-up with nephrologist; stable  Chronic systolic CHF - continue O2 @ 2L/min via [place] continuously and Torsemide 10 mg Q D  Gastritis - continue Omeprazole 20 mg 1 capsule PO Q D  OAB - continue Trospium chloride 60 mg 1 capsule PO Q D  Hypothyroidism - continue Synthroid 150 mcg daily   Anemia of chronic disease - hgb 10.1; stable  History of CVA - continue ASA EC 81 mg daily  Neuropathy - continue Lyrica 75 mg 1 capsule PO BID and BID PRN      I have filled out patient's discharge paperwork and written prescriptions.  Patient will  receive home health PT, OT, ST, Nursing and CNA.  DME provided:  O2 @ 2L/min via [place] continuously, portable (gas) and stationary .  Total discharge time: Greater than 30 minutes Greater than 50% was spent in counseling and coordination of care with the patient.    Discharge time involved coordination of the discharge process with social worker, nursing staff and therapy department. Medical justification for home health services/DME verified.   Kenard GowerMonina Medina-Vargas, NP [facility] [phone]

## 2016-01-14 ENCOUNTER — Telehealth: Payer: Self-pay

## 2016-01-14 NOTE — Telephone Encounter (Signed)
Matthias HughsKecia from [facility] called to let Dr. Chilton SiGreen they will be starting home care today on his patient. [facility].

## 2016-01-15 DIAGNOSIS — I5022 Chronic systolic (congestive) heart failure: Secondary | ICD-10-CM | POA: Diagnosis not present

## 2016-01-15 DIAGNOSIS — M6281 Muscle weakness (generalized): Secondary | ICD-10-CM | POA: Diagnosis not present

## 2016-01-15 DIAGNOSIS — N184 Chronic kidney disease, stage 4 (severe): Secondary | ICD-10-CM | POA: Diagnosis not present

## 2016-01-15 DIAGNOSIS — E1122 Type 2 diabetes mellitus with diabetic chronic kidney disease: Secondary | ICD-10-CM | POA: Diagnosis not present

## 2016-01-15 DIAGNOSIS — F0390 Unspecified dementia without behavioral disturbance: Secondary | ICD-10-CM | POA: Diagnosis not present

## 2016-01-15 DIAGNOSIS — I110 Hypertensive heart disease with heart failure: Secondary | ICD-10-CM | POA: Diagnosis not present

## 2016-01-16 ENCOUNTER — Telehealth: Payer: Self-pay

## 2016-01-16 NOTE — Telephone Encounter (Signed)
Message left on triage voicemail: Please call to give verbal order authorizing patient to be seen be Physical Therapist.   Per [facility] standing order, verbal order given. Message will be sent to patient's provider as a FYI.

## 2016-01-21 ENCOUNTER — Telehealth: Payer: Self-pay | Admitting: *Deleted

## 2016-01-21 ENCOUNTER — Other Ambulatory Visit: Payer: Medicare Other

## 2016-01-21 NOTE — Telephone Encounter (Signed)
Tammy ParkerJim [name] with [facility] called and requested verbal orders for OT 1x1wk, 2x7wk and 1x1wk.  Verbal orders given.

## 2016-01-21 NOTE — Telephone Encounter (Signed)
[name] stated that nurse can draw blood and take it to the [facility] at [address]. She would need to place Name, DOB, Date, Time and Initials on the Tube.  I called and spoke with nurse [name] with [facility] and she stated that she will draw. Orders given for A1C,CMP,Lipid,TSH. [name] son also notified.

## 2016-01-21 NOTE — Telephone Encounter (Signed)
Patient son, Onalee HuaDavid called and stated that patient is scheduled for bloodwork and wanted to know if the [facility] Nurse, Clint BolderSusan Owens [phone] could draw the blood and bring it to the office. Nurse stated that that was not a problem for her to do. Patient is too weak to get into the car.  I spoke with French Anaracy in our lab and she is going to call her lead and ask if this is ok to do. Son informed we would call him as soon as we get an answer.

## 2016-01-22 ENCOUNTER — Other Ambulatory Visit: Payer: Self-pay | Admitting: *Deleted

## 2016-01-22 DIAGNOSIS — G609 Hereditary and idiopathic neuropathy, unspecified: Secondary | ICD-10-CM

## 2016-01-22 DIAGNOSIS — R531 Weakness: Secondary | ICD-10-CM

## 2016-01-22 LAB — TSH: TSH: 0.44 u[IU]/mL (ref 0.41–5.90)

## 2016-01-22 LAB — BASIC METABOLIC PANEL
BUN: 43 mg/dL — AB (ref 4–21)
CREATININE: 2.3 mg/dL — AB (ref 0.5–1.1)
Glucose: 143 mg/dL
POTASSIUM: 5.2 mmol/L (ref 3.4–5.3)
Sodium: 136 mmol/L — AB (ref 137–147)

## 2016-01-22 LAB — LIPID PANEL
CHOLESTEROL: 219 mg/dL — AB (ref 0–200)
HDL: 62 mg/dL (ref 35–70)
LDL Cholesterol: 130 mg/dL
Triglycerides: 164 mg/dL — AB (ref 40–160)

## 2016-01-22 LAB — HEPATIC FUNCTION PANEL
ALK PHOS: 157 U/L — AB (ref 25–125)
ALT: 12 U/L (ref 7–35)
AST: 14 U/L (ref 13–35)
BILIRUBIN, TOTAL: 0.4 mg/dL

## 2016-01-22 LAB — CBC AND DIFFERENTIAL
HCT: 36 % (ref 36–46)
Hemoglobin: 11.9 g/dL — AB (ref 12.0–16.0)
PLATELETS: 293 10*3/uL (ref 150–399)
WBC: 9.6 10*3/mL

## 2016-01-22 MED ORDER — OMEPRAZOLE 20 MG PO CPDR: 20.0000 mg | DELAYED_RELEASE_CAPSULE | Freq: Every day | ORAL | 5 refills | Status: AC

## 2016-01-22 MED ORDER — PREGABALIN 75 MG PO CAPS: ORAL_CAPSULE | ORAL | 5 refills | Status: DC

## 2016-01-22 MED ORDER — LACTULOSE 10 GM/15ML PO SOLN: 45.0000 g | Freq: Two times a day (BID) | ORAL | 5 refills | Status: DC | PRN

## 2016-01-22 MED ORDER — AMIODARONE HCL 200 MG PO TABS: ORAL_TABLET | ORAL | 5 refills | Status: DC

## 2016-01-22 MED ORDER — TROSPIUM CHLORIDE ER 60 MG PO CP24: ORAL_CAPSULE | ORAL | 5 refills | Status: AC

## 2016-01-22 MED ORDER — TRAMADOL HCL 50 MG PO TABS: 25.0000 mg | ORAL_TABLET | Freq: Four times a day (QID) | ORAL | 1 refills | Status: AC | PRN

## 2016-01-22 MED ORDER — LEVOTHYROXINE SODIUM 150 MCG PO TABS: ORAL_TABLET | ORAL | 5 refills | Status: AC

## 2016-01-22 MED ORDER — TORSEMIDE 10 MG PO TABS: 10.0000 mg | ORAL_TABLET | Freq: Every day | ORAL | 5 refills | Status: AC

## 2016-01-22 MED ORDER — DILTIAZEM HCL ER COATED BEADS 120 MG PO CP24: 120.0000 mg | ORAL_CAPSULE | Freq: Every day | ORAL | 5 refills | Status: AC

## 2016-01-22 MED ORDER — LATANOPROST 0.005 % OP SOLN: 1.0000 [drp] | Freq: Every day | OPHTHALMIC | 5 refills | Status: AC

## 2016-01-22 NOTE — Telephone Encounter (Signed)
Onalee HuaDavid, son walked into office requesting refill on patient's medications. Patient has an upcoming appointment with Dr. Chilton SiGreen. Also, requested a Rx to be faxed to [facility] for a Manual Wheelchair. Stated that hers is over 80 years old and Medicare will pay for a new one after 5 years. Printed Rx and [facility]ed to [facility]. Rx's faxed to pharmacy. Son notified done.

## 2016-01-23 ENCOUNTER — Ambulatory Visit: Payer: Medicare Other | Admitting: Internal Medicine

## 2016-01-23 ENCOUNTER — Encounter: Payer: Self-pay | Admitting: *Deleted

## 2016-01-24 ENCOUNTER — Telehealth: Payer: Self-pay

## 2016-01-24 NOTE — Telephone Encounter (Signed)
Per Kennith Centerracey [facility] lab tech) labs were not released form [facility] (refer to phone note dated 01/21/16).  I followed up with patient's son and labs were drawn on Tuesday and delivered to the drawing center per home health nurse.  [name] (patient's son) asked that I call the home health nurse to confirm

## 2016-01-24 NOTE — Telephone Encounter (Signed)
I reviewed Dr.Green's folders on the ledge. [facility] and results are in his review and sign folder.  Labs were given to Dr.Carter yesterday and she ok'd them to wait for Dr.Green's return.   Dr.Green will not be in office until 02/04/16. I will place labs in Dr.Carter folder for tomorrow with a notation of Dr.Green's return date

## 2016-01-25 MED ORDER — AMOXICILLIN-POT CLAVULANATE 875-125 MG PO TABS: ORAL_TABLET | ORAL | 0 refills | Status: DC

## 2016-01-25 MED ORDER — SACCHAROMYCES BOULARDII 250 MG PO CAPS: ORAL_CAPSULE | ORAL | 0 refills | Status: DC

## 2016-01-25 NOTE — Addendum Note (Signed)
Addended by: Nelda SevereMAY, ANITA A on: 01/25/2016 11:28 AM   Modules accepted: Orders

## 2016-01-25 NOTE — Telephone Encounter (Signed)
Dr. Renato Gailseed reviewed U/A and Prescribed Augmentin 875/125mg  one every 12 hours for 10 days and Florastor 250mg  one twice daily for 10 days and to Push Fluids.   Rxs faxed to pharmacy and son notified and agreed.

## 2016-01-29 ENCOUNTER — Telehealth: Payer: Self-pay | Admitting: *Deleted

## 2016-01-29 NOTE — Telephone Encounter (Signed)
Recommend that he holds lactulose dose if she is having diarrhea.

## 2016-01-29 NOTE — Telephone Encounter (Signed)
Amy with Hospice called and requested verbal orders to start Hospice services at the son's request. Verbal orders given.

## 2016-01-29 NOTE — Telephone Encounter (Signed)
Patient son, Onalee HuaDavid notified and agreed.

## 2016-01-29 NOTE — Telephone Encounter (Signed)
Patient son, Onalee HuaDavid called and stated that patient is taking Lactulose to help stimulate BM. Patient has been having diarrhea for the last 2 days and wonders if she should be taking something else also for the diarrhea. Please Advise.

## 2016-02-06 ENCOUNTER — Ambulatory Visit: Payer: Medicare Other | Admitting: Internal Medicine

## 2016-02-11 ENCOUNTER — Telehealth: Payer: Self-pay | Admitting: *Deleted

## 2016-02-11 NOTE — Telephone Encounter (Signed)
I can sign of on this tomorrow.

## 2016-02-11 NOTE — Telephone Encounter (Signed)
FL2 Form filled out and placed in Dr. [facility] folder to review and sign.   Renee with Hospice called and requested a verbal order [facility] due to son in hospital unexpectedly. Verbal order Given.

## 2016-02-11 NOTE — Telephone Encounter (Signed)
Zack, Grandson called and requested a FL2 Form. Stated that patient's son is in the hospital, injured his back,  and they are needing to place patient in a facility. [facility] and was to follow up with you on 02/19/16 but they need to place her now. Will you fill out an FL2 Form? Please Advise.

## 2016-02-12 ENCOUNTER — Telehealth: Payer: Self-pay

## 2016-02-12 NOTE — Telephone Encounter (Signed)
[name] with Hospice called to inform [name] because her son is hospitalized and the family is unable to provide patient with the care needed.  FYI Only

## 2016-02-13 ENCOUNTER — Encounter: Payer: Self-pay | Admitting: Internal Medicine

## 2016-02-13 ENCOUNTER — Non-Acute Institutional Stay (SKILLED_NURSING_FACILITY): Payer: Medicare Other | Admitting: Internal Medicine

## 2016-02-13 DIAGNOSIS — K2990 Gastroduodenitis, unspecified, without bleeding: Secondary | ICD-10-CM

## 2016-02-13 DIAGNOSIS — E1122 Type 2 diabetes mellitus with diabetic chronic kidney disease: Secondary | ICD-10-CM

## 2016-02-13 DIAGNOSIS — L89620 Pressure ulcer of left heel, unstageable: Secondary | ICD-10-CM

## 2016-02-13 DIAGNOSIS — M810 Age-related osteoporosis without current pathological fracture: Secondary | ICD-10-CM | POA: Diagnosis not present

## 2016-02-13 DIAGNOSIS — M792 Neuralgia and neuritis, unspecified: Secondary | ICD-10-CM

## 2016-02-13 DIAGNOSIS — K2970 Gastritis, unspecified, without bleeding: Secondary | ICD-10-CM

## 2016-02-13 DIAGNOSIS — I480 Paroxysmal atrial fibrillation: Secondary | ICD-10-CM | POA: Diagnosis not present

## 2016-02-13 DIAGNOSIS — E039 Hypothyroidism, unspecified: Secondary | ICD-10-CM | POA: Diagnosis not present

## 2016-02-13 DIAGNOSIS — F0390 Unspecified dementia without behavioral disturbance: Secondary | ICD-10-CM

## 2016-02-13 DIAGNOSIS — I5022 Chronic systolic (congestive) heart failure: Secondary | ICD-10-CM | POA: Diagnosis not present

## 2016-02-13 DIAGNOSIS — Z8673 Personal history of transient ischemic attack (TIA), and cerebral infarction without residual deficits: Secondary | ICD-10-CM

## 2016-02-13 DIAGNOSIS — N3281 Overactive bladder: Secondary | ICD-10-CM

## 2016-02-13 DIAGNOSIS — N184 Chronic kidney disease, stage 4 (severe): Secondary | ICD-10-CM

## 2016-02-13 DIAGNOSIS — D638 Anemia in other chronic diseases classified elsewhere: Secondary | ICD-10-CM | POA: Diagnosis not present

## 2016-02-13 NOTE — Progress Notes (Signed)
LOCATION: [facility]  PCP: Murray HodgkinsArthur Green, MD   Code Status: DNR  Goals of care: Advanced Directive information Advanced Directives 01/10/2016  Does Patient Have a Medical Advance Directive? Yes  Type of Advance Directive Out of facility DNR (pink MOST or yellow form)  Does patient want to make changes to medical advance directive? No - Patient declined  Copy of Healthcare Power of Attorney in Chart? Yes  Pre-existing out of facility DNR order (yellow form or pink MOST form) -       Extended Emergency Contact Information Primary Emergency Contact: Tonita PhoenixGray,David C Address: [address] [place]          [place], [place] [address] [place] Home Phone: [phone] Mobile Phone: [phone] Relation: Son   Allergies  Allergen Reactions  . Cosopt [Dorzolamide Hcl-Timolol Mal] Other (See Comments)    Redness and burning in both eyes  . Noroxin [Norfloxacin] Swelling  . Sulfa Antibiotics Nausea And Vomiting  . Prozac [Fluoxetine Hcl] Rash    Chief Complaint  Patient presents with  . New Admit To SNF    New Admission Visit     HPI:  Patient is a 82 y.o. female seen today for long term care with hospice service. She was residing at home with hospice service prior to this. She has medical history of atrial fibrillation, diabetes mellitus type 2, hypothyroidism, end-stage renal disease, peripheral vascular disease among others.She has bowel bladder incontinence. She has chronic hearing loss. She also has some memory issues.  Review of Systems:  Constitutional: Negative for fever, chills, diaphoresis.  HENT: Negative for headache, congestion, nasal discharge. Eyes: Negative for double vision and discharge. Wears glasses.   Respiratory: Negative for cough, wheezing.   she has shortness of breath with minimal exertion and is currently on oxygen. Cardiovascular: Negative for chest pain, palpitation.  Gastrointestinal: Negative for heartburn, nausea, vomiting,  abdominal pain. Does not remember her last bowel movement. Genitourinary: Negative for dysuria.  Musculoskeletal: Negative for back pain, fall in the facility.  Skin: Negative for itching, rash.  Neurological: Negative for dizziness. Psychiatric/Behavioral: Negative for depression   Past Medical History:  Diagnosis Date  . Allergic rhinitis, cause unspecified   . Anemia   . Arthralgia of temporomandibular joint   . Arthropathy, unspecified, site unspecified   . Atrial fibrillation (HCC)   . Bronchitis, acute   . Carpal tunnel syndrome   . Chest pain, unspecified   . Chronic kidney disease, unspecified   . Closed dislocation, thoracic vertebra   . Closed fracture of lumbar vertebra without mention of spinal cord injury   . Diabetes type 2, uncontrolled (HCC)   . Diabetes with renal manifestations(250.4)   . Diastasis of muscle   . Eczema   . Gait disorder   . Hematuria, unspecified   . Hemoptysis   . History of nuclear stress test 06/06/2010   lexiscan; normal pattern of perfusion; low risk   . Hyperlipidemia   . Hyperpotassemia   . Hypertension   . Hypothyroid   . Insomnia, unspecified   . Macular degeneration (senile) of retina, unspecified   . Mild memory disturbance   . Myoclonus   . Obesity, unspecified   . Open wound of knee, leg (except thigh), and ankle, without mention of complication   . Osteoporosis   . Other atopic dermatitis and related conditions   . Other disorder of calcium metabolism   . Other specified idiopathic peripheral neuropathy   . Personal history of fall   .  Personality change due to conditions classified elsewhere   . Scoliosis   . Seborrheic keratosis   . Trigger finger (acquired)   . Type II or unspecified type diabetes mellitus without mention of complication, not stated as uncontrolled   . Unspecified closed fracture of pelvis   . Unspecified constipation   . Unspecified glaucoma(365.9)   . Unspecified hereditary and idiopathic  peripheral neuropathy   . Unspecified hypertrophic and atrophic condition of skin   . Unspecified pruritic disorder   . Unspecified urinary incontinence   . Unspecified vitamin D deficiency   . Urinary tract infection, site not specified   . Varicose veins of lower extremities with inflammation    Past Surgical History:  Procedure Laterality Date  . ABDOMINAL HYSTERECTOMY     1973  . BIOPSY BREAST     2000  . CORONARY ARTERY BYPASS GRAFT     Dr Tyrone Sage 04/1996  . ESOPHAGOGASTRODUODENOSCOPY N/A 08/17/2012   Procedure: ESOPHAGOGASTRODUODENOSCOPY (EGD);  Surgeon: Hilarie Fredrickson, MD;  Location: [facility];  Service: Endoscopy;  Laterality: N/A;  . HOT HEMOSTASIS N/A 08/17/2012   Procedure: HOT HEMOSTASIS (ARGON PLASMA COAGULATION/BICAP);  Surgeon: Hilarie Fredrickson, MD;  Location: [facility];  Service: Endoscopy;  Laterality: N/A;  . KNEE ARTHROSCOPY     left Dr Thurston Hole   . LASER LAPAROSCOPY     right eye 04/2005  . LUMBAR SPINE SURGERY     Dr Newell Coral   . RIGHT OOPHORECTOMY     1950   Social History:   reports that she has never smoked. She has never used smokeless tobacco. She reports that she drinks alcohol. She reports that she does not use drugs.  Family History  Problem Relation Age of Onset  . Diabetes Mother   . Cancer Sister     colon  . Alzheimer's disease Sister   . Prostate cancer Son     Medications: Allergies as of 02/13/2016      Reactions   Cosopt [dorzolamide Hcl-timolol Mal] Other (See Comments)   Redness and burning in both eyes   Noroxin [norfloxacin] Swelling   Sulfa Antibiotics Nausea And Vomiting   Prozac [fluoxetine Hcl] Rash      Medication List       Accurate as of 02/13/16  9:58 AM. Always use your most recent med list.          acetaminophen 650 MG suppository Commonly known as:  TYLENOL Place 650 mg rectally every 4 (four) hours.   acetaminophen 650 MG CR tablet Commonly known as:  TYLENOL Take 650 mg by mouth every 6 (six) hours as  needed for pain.   amiodarone 200 MG tablet Commonly known as:  PACERONE Take one tablet by mouth once daily   aspirin EC 81 MG tablet Take 1 tablet (81 mg total) by mouth daily.   bisacodyl 10 MG suppository Commonly known as:  DULCOLAX Place 10 mg rectally every 3 (three) days.   calcitonin (salmon) 200 UNIT/ACT nasal spray Commonly known as:  MIACALCIN/FORTICAL INSTILL 1 SPRAY INTO ALTERNATING NOSTRILS ONCE A DAY FOR OSTEOPROSIS   CENTRUM SILVER PO Take 1 tablet by mouth daily.   dextromethorphan-guaiFENesin 30-600 MG 12hr tablet Commonly known as:  MUCINEX DM Take 1 tablet by mouth every 12 (twelve) hours as needed for cough.   diltiazem 120 MG 24 hr capsule Commonly known as:  CARDIZEM CD Take 1 capsule (120 mg total) by mouth daily.   ipratropium-albuterol 0.5-2.5 (3) MG/3ML Soln Commonly known as:  DUONEB Take 3 mLs by nebulization 3 (three) times daily.   lactulose 10 GM/15ML solution Commonly known as:  CHRONULAC Take by mouth 2 (two) times daily as needed for mild constipation (Give 45 mL).   latanoprost 0.005 % ophthalmic solution Commonly known as:  XALATAN Place 1 drop into the left eye at bedtime.   levothyroxine 150 MCG tablet Commonly known as:  SYNTHROID, LEVOTHROID Take one tablet by mouth 30 minutes before breakfast for thyroid   omeprazole 20 MG capsule Commonly known as:  PRILOSEC Take 1 capsule (20 mg total) by mouth daily.   OXYGEN Inhale 2 L into the lungs continuous.   pregabalin 75 MG capsule Commonly known as:  LYRICA Take 75 mg by mouth 2 (two) times daily.   torsemide 10 MG tablet Commonly known as:  DEMADEX Take 1 tablet (10 mg total) by mouth daily.   traMADol 50 MG tablet Commonly known as:  ULTRAM Take 0.5 tablets (25 mg total) by mouth every 6 (six) hours as needed for moderate pain.   Trospium Chloride 60 MG Cp24 Take one capsule by mouth once daily for bladder control       Immunizations: Immunization History    Administered Date(s) Administered  . Influenza Whole 11/26/2010, 11/27/2011  . Influenza, High Dose Seasonal PF 11/30/2013  . Influenza,inj,Quad PF,36+ Mos 10/31/2015  . Influenza-Unspecified 11/19/2012, 11/30/2013, 11/27/2014, 11/27/2014  . PPD Test 11/21/2015, 12/03/2015  . Pneumococcal Conjugate-13 03/04/2011  . Pneumococcal Polysaccharide-23 10/31/2015  . Tdap 03/04/2011  . Zoster 07/14/2005     Physical Exam: Vitals:   02/13/16 0948  BP: (!) 136/54  Pulse: 72  Resp: 20  Temp: 98.4 F (36.9 C)  TempSrc: Oral  SpO2: 93%  Weight: 174 lb (78.9 kg)  Height: 5\' 7"  (1.702 m)   Body mass index is 27.25 kg/m.   General- elderly female, frail, chronically ill appearing, in no acute distress Head- normocephalic, atraumatic Nose- no nasal discharge Throat- moist mucus membrane  Eyes- PERRLA, EOMI, no pallor, no icterus, no discharge, normal conjunctiva, normal sclera Neck- no cervical lymphadenopathy Cardiovascular- normal s1,s2, no murmur, trace leg edema Respiratory- poor air movement, no wheeze, no rhonchi, no crackles, on oxygen 2 L by nasal cannula Abdomen- bowel sounds present, soft, non tender, distended, no guarding or rigidity, has Foley catheter with clear urine Musculoskeletal- able to move all 4 extremities, generalized weakness, right foot drop Neurological- alert and oriented to self, place and year but not to month and person Skin- warm and dry, left heel unstageable pressure ulcer with suspected deep tissue injury Psychiatry- normal mood and affect    Labs reviewed: Basic Metabolic Panel:  Recent Labs  40/98/11 1636  11/28/15 0318 11/29/15 0330 12/01/15 0825  12/17/15 12/31/15 01/22/16  NA 140  < > 143 142 140  < > 140 145 136*  K 4.3  < > 4.0 3.6 3.5  < > 4.6 4.4 5.2  CL 113*  < > 114* 111 105  --   --   --   --   CO2 18*  < > 21* 23 24  --   --   --   --   GLUCOSE 133*  < > 193* 136* 198*  --   --   --   --   BUN 37*  < > 26* 25* 21*  < > 63*  31* 43*  CREATININE 2.10*  < > 1.94* 1.95* 1.92*  < > 3.4* 1.7* 2.3*  CALCIUM 9.3  < >  9.5 10.1 10.3  --   --   --   --   MG 1.7  --   --   --   --   --   --   --   --   < > = values in this interval not displayed. Liver Function Tests:  Recent Labs  11/13/15 0910 11/19/15 0654  11/26/15 1741 12/10/15 01/22/16  AST 20 21  < > 17 15 14   ALT 13* 22  < > 13* 8 12  ALKPHOS 96 78  < > 79 92 157*  BILITOT 0.7 0.9  --  0.7  --   --   PROT 6.3* 6.2*  --  6.7  --   --   ALBUMIN 3.2* 2.7*  --  3.1*  --   --   < > = values in this interval not displayed. No results for input(s): LIPASE, AMYLASE in the last 8760 hours. No results for input(s): AMMONIA in the last 8760 hours. CBC:  Recent Labs  11/28/15 0318 11/29/15 0330 12/01/15 0825 12/07/15 12/10/15 12/17/15 01/22/16  WBC 8.4 9.1 10.0 14.2 11.2 10.6 9.6  NEUTROABS  --   --  6.7 10,934 8 7  --   HGB 8.8* 9.0* 10.3* 9.0* 9.5* 10.1* 11.9*  HCT 27.6* 28.5* 31.4* 28* 31* 33* 36  MCV 99.3 98.6 93.5  --   --   --   --   PLT 287 316 347 440* 417* 315 293   Cardiac Enzymes:  Recent Labs  11/18/15 1933 11/19/15 0105 11/19/15 0654  TROPONINI 0.48* 0.51* 0.49*   BNP: Invalid input(s): POCBNP CBG:  Recent Labs  12/03/15 0750 12/03/15 1125 12/03/15 1641  GLUCAP 173* 223* 223*     Assessment/Plan  Neuropathy pain Continue Lyrica 75 mg twice a day.  Left heel unstageable pressure ulcer Continue wound care. Will have the area cleaned with normal saline, apply Betadine, calcium alginate and silver nitrate with foam dressing. To provide prolonged boot to help relieve pressure. Provide gel overlay mattress. Add decubivite to help promote wound healing.  Gastritis and gastroduodenitis Stable. Continue omeprazole 20 mg daily.  Dementia without behavioral disturbance Provide supportive care and monitor. Fall precautions. Pressure ulcer prophylaxis.  Hypothyroidism Continue Synthroid 150 g daily.  A. fib Heart rate is  controlled at present. Patient denies any palpitations. Continue amiodarone 200 mg daily. Continue aspirin 81 mg daily enteric-coated. Continue diltiazem 120 mg daily.  Osteoporosis No recent fall or pathological fracture. Continue calcitonin nasal spray.  Overactive bladder Continue trospium daily. Currently has a Foley catheter in place. Perform voiding trial in a.m. if unable to void we will place another Foley catheter. Continue perineal care.  chronic systolic chf Monitor o2 sat and provide o2 by nasal canula as above. EF 35% on echocardiogram. Continue torsemide 10 mg daily. Patient does not want to be woken up in am to be weighed. She is currently on weight check 3 days a week. Change this to once a week and monitor.   ckd stage 4 Monitor bmp  Anemia of chronic disease Monitor cbc  History of cva Continue aspirin   Goals of care: long term care  Labs/tests ordered: cbc, bmp 1 week  Family/ staff Communication: reviewed care plan with patient, hospice nurse and nursing supervisor    Oneal GroutMAHIMA Leston Schueller, MD Internal Medicine [facility] [address] [place], [place] [address] Cell Phone (Monday-Friday 8 am - 5 pm): [phone] On Call: [phone] and  follow prompts after 5 pm and on weekends Office Phone: [phone] Office Fax: [phone]

## 2016-02-19 ENCOUNTER — Ambulatory Visit: Payer: Medicare Other | Admitting: Internal Medicine

## 2016-02-19 ENCOUNTER — Encounter: Payer: Self-pay | Admitting: Internal Medicine

## 2016-02-19 LAB — HEPATIC FUNCTION PANEL
ALK PHOS: 107 U/L (ref 25–125)
ALT: 11 U/L (ref 7–35)
AST: 12 U/L — AB (ref 13–35)
BILIRUBIN, TOTAL: 0.2 mg/dL

## 2016-02-19 LAB — BASIC METABOLIC PANEL
BUN: 28 mg/dL — AB (ref 4–21)
CREATININE: 1.7 mg/dL — AB (ref 0.5–1.1)
Glucose: 147 mg/dL
POTASSIUM: 4.2 mmol/L (ref 3.4–5.3)
SODIUM: 143 mmol/L (ref 137–147)

## 2016-02-19 LAB — CBC AND DIFFERENTIAL
HCT: 32 % — AB (ref 36–46)
Hemoglobin: 10.3 g/dL — AB (ref 12.0–16.0)
NEUTROS ABS: 5 /uL
Platelets: 392 10*3/uL (ref 150–399)
WBC: 8.1 10^3/mL

## 2016-03-10 ENCOUNTER — Other Ambulatory Visit: Payer: Self-pay | Admitting: *Deleted

## 2016-03-10 MED ORDER — PREGABALIN 75 MG PO CAPS: 75.0000 mg | ORAL_CAPSULE | Freq: Two times a day (BID) | ORAL | 0 refills | Status: AC

## 2016-03-10 NOTE — Telephone Encounter (Signed)
[facility] #[phone] Fax: [phone] 

## 2016-03-11 ENCOUNTER — Encounter: Payer: Self-pay | Admitting: Adult Health

## 2016-03-11 ENCOUNTER — Non-Acute Institutional Stay (SKILLED_NURSING_FACILITY): Payer: Medicare Other | Admitting: Adult Health

## 2016-03-11 DIAGNOSIS — E1122 Type 2 diabetes mellitus with diabetic chronic kidney disease: Secondary | ICD-10-CM | POA: Diagnosis not present

## 2016-03-11 DIAGNOSIS — E039 Hypothyroidism, unspecified: Secondary | ICD-10-CM

## 2016-03-11 DIAGNOSIS — I5022 Chronic systolic (congestive) heart failure: Secondary | ICD-10-CM | POA: Diagnosis not present

## 2016-03-11 DIAGNOSIS — N3281 Overactive bladder: Secondary | ICD-10-CM

## 2016-03-11 DIAGNOSIS — M158 Other polyosteoarthritis: Secondary | ICD-10-CM

## 2016-03-11 DIAGNOSIS — D638 Anemia in other chronic diseases classified elsewhere: Secondary | ICD-10-CM

## 2016-03-11 DIAGNOSIS — F0390 Unspecified dementia without behavioral disturbance: Secondary | ICD-10-CM | POA: Diagnosis not present

## 2016-03-11 DIAGNOSIS — K5901 Slow transit constipation: Secondary | ICD-10-CM | POA: Diagnosis not present

## 2016-03-11 DIAGNOSIS — K2970 Gastritis, unspecified, without bleeding: Secondary | ICD-10-CM

## 2016-03-11 DIAGNOSIS — N184 Chronic kidney disease, stage 4 (severe): Secondary | ICD-10-CM

## 2016-03-11 DIAGNOSIS — I480 Paroxysmal atrial fibrillation: Secondary | ICD-10-CM

## 2016-03-11 DIAGNOSIS — K2990 Gastroduodenitis, unspecified, without bleeding: Secondary | ICD-10-CM

## 2016-03-11 DIAGNOSIS — M8949 Other hypertrophic osteoarthropathy, multiple sites: Secondary | ICD-10-CM

## 2016-03-11 DIAGNOSIS — Z8673 Personal history of transient ischemic attack (TIA), and cerebral infarction without residual deficits: Secondary | ICD-10-CM | POA: Diagnosis not present

## 2016-03-11 DIAGNOSIS — M792 Neuralgia and neuritis, unspecified: Secondary | ICD-10-CM

## 2016-03-11 NOTE — Progress Notes (Signed)
DATE:  03/11/2016   MRN:  [id number]  BIRTHDAY: May 16, 1916  Facility:  Nursing Home Location:  [facility]  Nursing Home Room Number: 902-P  LEVEL OF CARE:  SNF [phone]  Contact Information    Name Relation Home Work NataliaMobile   Messenger,David C Son [phone]  [phone]   Carmelia BakeGray,Robert Son [phone]     Edward JollyGray,Michael Son [phone]         Code Status History    Date Active Date Inactive Code Status Order ID Comments User Context   11/26/2015  9:25 PM 12/03/2015  8:49 PM DNR [id number]  Hillary BowJared M Gardner, DO ED   11/13/2015  2:44 PM 11/21/2015  5:37 PM DNR [id number]  Meredith PelPaula M Guenther, NP ED   11/13/2015 12:29 PM 11/13/2015  2:44 PM DNR [id number]  Simonne MartinetPeter E Babcock, NP ED   11/13/2015 12:00 PM 11/13/2015 12:29 PM DNR [id number]  Gerhard Munchobert Lockwood, MD ED   10/07/2012  8:41 PM 10/10/2012  5:52 PM DNR [id number]  Jerald KiefStephen K Chiu, MD ED   08/14/2012  3:29 PM 08/19/2012  7:11 PM DNR [id number]  Laveda Normanhris N Oti, MD Inpatient    Questions for Most Recent Historical Code Status (Order [id number])    Question Answer Comment   In the event of cardiac or respiratory ARREST Do not call a "code blue"    In the event of cardiac or respiratory ARREST Do not perform Intubation, CPR, defibrillation or ACLS    In the event of cardiac or respiratory ARREST Use medication by any route, position, wound care, and other measures to relive pain and suffering. May use oxygen, suction and manual treatment of airway obstruction as needed for comfort.         Advance Directive Documentation   Flowsheet Row Most Recent Value  Type of Advance Directive  Out of facility DNR (pink MOST or yellow form)  Pre-existing out of facility DNR order (yellow form or pink MOST form)  No data  "MOST" Form in Place?  No data       Chief Complaint  Patient presents with  . Medical Management of Chronic Issues    HISTORY OF PRESENT ILLNESS:  This is a 89-YO female seen for a routine visit.  [facility]  She is being followed-up by hospice. She was seen in her room today and complained of LLQ abdominal pain. She verbalized being constipated.   PAST MEDICAL HISTORY:  Past Medical History:  Diagnosis Date  . Allergic rhinitis, cause unspecified   . Anemia   . Arthralgia of temporomandibular joint   . Arthropathy, unspecified, site unspecified   . Atrial fibrillation (HCC)   . Bronchitis, acute   . Carpal tunnel syndrome   . Chest pain, unspecified   . Chronic kidney disease, unspecified   . Closed dislocation, thoracic vertebra   . Closed fracture of lumbar vertebra without mention of spinal cord injury   . Diabetes type 2, uncontrolled (HCC)   . Diabetes with renal manifestations(250.4)   . Diastasis of muscle   . Eczema   . Gait disorder   . Hematuria, unspecified   . Hemoptysis   . History of nuclear stress test 06/06/2010   lexiscan; normal pattern of perfusion; low risk   . Hyperlipidemia   . Hyperpotassemia   . Hypertension   . Hypothyroid   . Insomnia, unspecified   . Macular degeneration (senile) of retina, unspecified   .  Mild memory disturbance   . Myoclonus   . Obesity, unspecified   . Open wound of knee, leg (except thigh), and ankle, without mention of complication   . Osteoporosis   . Other atopic dermatitis and related conditions   . Other disorder of calcium metabolism   . Other specified idiopathic peripheral neuropathy   . Personal history of fall   . Personality change due to conditions classified elsewhere   . Scoliosis   . Seborrheic keratosis   . Trigger finger (acquired)   . Type II or unspecified type diabetes mellitus without mention of complication, not stated as uncontrolled   . Unspecified closed fracture of pelvis   . Unspecified constipation   . Unspecified glaucoma(365.9)   . Unspecified hereditary and idiopathic peripheral neuropathy   . Unspecified hypertrophic and atrophic condition of skin   .  Unspecified pruritic disorder   . Unspecified urinary incontinence   . Unspecified vitamin D deficiency   . Urinary tract infection, site not specified   . Varicose veins of lower extremities with inflammation      CURRENT MEDICATIONS: Reviewed  Patient's Medications  New Prescriptions   No medications on file  Previous Medications   ACETAMINOPHEN (TYLENOL) 650 MG CR TABLET    Take 650 mg by mouth every 6 (six) hours as needed for pain.   ACETAMINOPHEN (TYLENOL) 650 MG SUPPOSITORY    Place 650 mg rectally every 4 (four) hours.    AMIODARONE (PACERONE) 200 MG TABLET    Take one tablet by mouth once daily   ASPIRIN EC 81 MG TABLET    Take 1 tablet (81 mg total) by mouth daily.   BISACODYL (DULCOLAX) 10 MG SUPPOSITORY    Place 10 mg rectally every 3 (three) days.   CALCITONIN, SALMON, (MIACALCIN/FORTICAL) 200 UNIT/ACT NASAL SPRAY    INSTILL 1 SPRAY INTO ALTERNATING NOSTRILS ONCE A DAY FOR OSTEOPROSIS   DEXTROMETHORPHAN-GUAIFENESIN (MUCINEX DM) 30-600 MG PER 12 HR TABLET    Take 1 tablet by mouth every 12 (twelve) hours as needed for cough.    DILTIAZEM (CARDIZEM CD) 120 MG 24 HR CAPSULE    Take 1 capsule (120 mg total) by mouth daily.   IPRATROPIUM-ALBUTEROL (DUONEB) 0.5-2.5 (3) MG/3ML SOLN    Take 3 mLs by nebulization 3 (three) times daily.   LACTULOSE (CHRONULAC) 10 GM/15ML SOLUTION    Take by mouth 2 (two) times daily as needed for mild constipation (Give 45 mL).   LATANOPROST (XALATAN) 0.005 % OPHTHALMIC SOLUTION    Place 1 drop into the left eye at bedtime.   LEVOTHYROXINE (SYNTHROID, LEVOTHROID) 150 MCG TABLET    Take one tablet by mouth 30 minutes before breakfast for thyroid   MULTIPLE VITAMINS-MINERALS (DECUBI-VITE) CAPS    Take 1 capsule by mouth daily.   OMEPRAZOLE (PRILOSEC) 20 MG CAPSULE    Take 1 capsule (20 mg total) by mouth daily.   OXYGEN    Inhale 2 L into the lungs continuous.   PREGABALIN (LYRICA) 75 MG CAPSULE    Take 1 capsule (75 mg total) by mouth 2 (two) times  daily.   TORSEMIDE (DEMADEX) 10 MG TABLET    Take 1 tablet (10 mg total) by mouth daily.   TRAMADOL (ULTRAM) 50 MG TABLET    Take 0.5 tablets (25 mg total) by mouth every 6 (six) hours as needed for moderate pain.   TROSPIUM CHLORIDE 60 MG CP24    Take one capsule by mouth once daily for bladder control  Modified Medications   No medications on file  Discontinued Medications   MULTIPLE VITAMINS-MINERALS (CENTRUM SILVER PO)    Take 1 tablet by mouth daily.     Allergies  Allergen Reactions  . Cosopt [Dorzolamide Hcl-Timolol Mal] Other (See Comments)    Redness and burning in both eyes  . Noroxin [Norfloxacin] Swelling  . Sulfa Antibiotics Nausea And Vomiting  . Prozac [Fluoxetine Hcl] Rash     REVIEW OF SYSTEMS:  GENERAL: no change in appetite, no fatigue, no weight changes, no fever, chills or weakness EYES: Denies change in vision, dry eyes, eye pain, itching or discharge EARS: Denies change in hearing, ringing in ears, or earache NOSE: Denies nasal congestion or epistaxis MOUTH and THROAT: Denies oral discomfort, gingival pain or bleeding, pain from teeth or hoarseness   RESPIRATORY: no cough, SOB, DOE, wheezing, hemoptysis CARDIAC: no chest pain, edema or palpitations GI: complains of abdominal pain, + constipation GU: Denies dysuria, frequency, hematuria, incontinence, or discharge PSYCHIATRIC: Denies feeling of depression or anxiety. No report of hallucinations, insomnia, paranoia, or agitation     PHYSICAL EXAMINATION  GENERAL APPEARANCE: Well nourished. In no acute distress. Normal body habitus SKIN:  Skin is warm and dry. Left posterior heel wound is healed HEAD: Normal in size and contour. No evidence of trauma EYES: Lids open and close normally. No blepharitis, entropion or ectropion. PERRL. Conjunctivae are clear and sclerae are white. Lenses are without opacity EARS: Pinnae are normal. Hard of hearing MOUTH and THROAT: Lips are without lesions. Oral mucosa is  moist and without lesions. Tongue is normal in shape, size, and color and without lesions NECK: supple, trachea midline, no neck masses, no thyroid tenderness, no thyromegaly LYMPHATICS: no LAN in the neck, no supraclavicular LAN RESPIRATORY: breathing is even & unlabored, BS CTAB CARDIAC: RRR, no murmur,no extra heart sounds, no edema GI: abdomen soft, normal BS, no masses, + LLQ tenderness, no hepatomegaly, no splenomegaly EXTREMITIES:  Able to move 4 extremities; BLE generalized weakness PSYCHIATRIC: Alert to self and time and disoriented to place. Affect and behavior are appropriate  LABS/RADIOLOGY: Labs reviewed: Basic Metabolic Panel:  Recent Labs  16/10/96 1636  11/28/15 0318 11/29/15 0330 12/01/15 0825  12/31/15 01/22/16 02/19/16  NA 140  < > 143 142 140  < > 145 136* 143  K 4.3  < > 4.0 3.6 3.5  < > 4.4 5.2 4.2  CL 113*  < > 114* 111 105  --   --   --   --   CO2 18*  < > 21* 23 24  --   --   --   --   GLUCOSE 133*  < > 193* 136* 198*  --   --   --   --   BUN 37*  < > 26* 25* 21*  < > 31* 43* 28*  CREATININE 2.10*  < > 1.94* 1.95* 1.92*  < > 1.7* 2.3* 1.7*  CALCIUM 9.3  < > 9.5 10.1 10.3  --   --   --   --   MG 1.7  --   --   --   --   --   --   --   --   < > = values in this interval not displayed. Liver Function Tests:  Recent Labs  11/13/15 0910 11/19/15 0654  11/26/15 1741 12/10/15 01/22/16 02/19/16  AST 20 21  < > 17 15 14  12*  ALT 13* 22  < > 13* 8 12  11  ALKPHOS 96 78  < > 79 92 157* 107  BILITOT 0.7 0.9  --  0.7  --   --   --   PROT 6.3* 6.2*  --  6.7  --   --   --   ALBUMIN 3.2* 2.7*  --  3.1*  --   --   --   < > = values in this interval not displayed.  CBC:  Recent Labs  11/28/15 0318 11/29/15 0330 12/01/15 0825  12/10/15 12/17/15 01/22/16 02/19/16  WBC 8.4 9.1 10.0  < > 11.2 10.6 9.6 8.1  NEUTROABS  --   --  6.7  < > 8 7  --  5  HGB 8.8* 9.0* 10.3*  < > 9.5* 10.1* 11.9* 10.3*  HCT 27.6* 28.5* 31.4*  < > 31* 33* 36 32*  MCV 99.3 98.6 93.5  --    --   --   --   --   PLT 287 316 347  < > 417* 315 293 392  < > = values in this interval not displayed. Lipid Panel:  Recent Labs  09/07/15 1520 01/22/16  HDL 38* 62   Cardiac Enzymes:  Recent Labs  11/18/15 1933 11/19/15 0105 11/19/15 0654  TROPONINI 0.48* 0.51* 0.49*   CBG:  Recent Labs  12/03/15 0750 12/03/15 1125 12/03/15 1641  GLUCAP 173* 223* 223*     ASSESSMENT/PLAN:  Neuropathic pain - continue Lyrica 75 mg 1 capsule by mouth twice a day,   Hypothyroidism - continue Synthroid 150 g 1 tab by mouth daily Lab Results  Component Value Date   TSH 0.44 01/22/2016   Atrial fibrillation - rate controlled; continue amiodarone 200 mg 1 tab by mouth daily Monday diltiazem 12 hour ER 120 mg 1 capsule by mouth daily  Chronic systolic CHF - no SOB; continue torsemide 10 mg 1 tab by mouth daily  Overactive bladder - continue trospium 60 mg 1 capsule by mouth daily  Osteoarthrosis - continue Tylenol arthritis ER 650 mg 1 tab by mouth every 6 hours when necessary and tramadol 50 mg 1/2 tab PO Q 6 hours PRN  Chronic constipation discontinue lactulose when necessary and start lactulose 10 g/15 mL give 20 g/30 mL by mouth daily  History of CVA - continue aspirin 81 mg 1 tab by mouth daily  Anemia of chronic disease - stable Lab Results  Component Value Date   HGB 10.3 (A) 02/19/2016   Chronic kidney disease, stage IV - stable Lab Results  Component Value Date   CREATININE 1.7 (A) 02/19/2016   Gastritis - continue omeprazole 20 mg 1 capsule by mouth daily  Dementia without behavioral disturbance - continue supportive care; follow-up precautions   Goals of care:  Short-term rehabilitation/Hospice   Rojelio Uhrich C.Medina-Vargas  -  NP [employer] [phone]

## 2016-04-07 ENCOUNTER — Non-Acute Institutional Stay (SKILLED_NURSING_FACILITY): Payer: Medicare Other | Admitting: Adult Health

## 2016-04-07 ENCOUNTER — Encounter: Payer: Self-pay | Admitting: Adult Health

## 2016-04-07 DIAGNOSIS — K5909 Other constipation: Secondary | ICD-10-CM | POA: Diagnosis not present

## 2016-04-07 DIAGNOSIS — I5022 Chronic systolic (congestive) heart failure: Secondary | ICD-10-CM

## 2016-04-07 DIAGNOSIS — R05 Cough: Secondary | ICD-10-CM

## 2016-04-07 DIAGNOSIS — N3281 Overactive bladder: Secondary | ICD-10-CM | POA: Diagnosis not present

## 2016-04-07 DIAGNOSIS — M1990 Unspecified osteoarthritis, unspecified site: Secondary | ICD-10-CM | POA: Diagnosis not present

## 2016-04-07 DIAGNOSIS — J189 Pneumonia, unspecified organism: Secondary | ICD-10-CM

## 2016-04-07 DIAGNOSIS — R059 Cough, unspecified: Secondary | ICD-10-CM

## 2016-04-07 NOTE — Progress Notes (Signed)
DATE:  04/07/2016   MRN:  [id number]  BIRTHDAY: Feb 23, 1917  Facility:  Nursing Home Location:  [facility]  Nursing Home Room Number: 902-P  LEVEL OF CARE:  SNF [phone])  Contact Information    Name Relation Home Work [place] [phone]  [phone]   Vanita, Cannell [phone]     Sharlotte, Baka [phone]         Code Status History    Date Active Date Inactive Code Status Order ID Comments User Context   11/26/2015  9:25 PM 12/03/2015  8:49 PM DNR [id number]  Hillary Bow, DO ED   11/13/2015  2:44 PM 11/21/2015  5:37 PM DNR [id number]  Meredith Pel, NP ED   11/13/2015 12:29 PM 11/13/2015  2:44 PM DNR [id number]  Simonne Martinet, NP ED   11/13/2015 12:00 PM 11/13/2015 12:29 PM DNR [id number]  Gerhard Munch, MD ED   10/07/2012  8:41 PM 10/10/2012  5:52 PM DNR [id number]  Jerald Kief, MD ED   08/14/2012  3:29 PM 08/19/2012  7:11 PM DNR [id number]  Laveda Norman, MD Inpatient    Questions for Most Recent Historical Code Status (Order [id number])    Question Answer Comment   In the event of cardiac or respiratory ARREST Do not call a "code blue"    In the event of cardiac or respiratory ARREST Do not perform Intubation, CPR, defibrillation or ACLS    In the event of cardiac or respiratory ARREST Use medication by any route, position, wound care, and other measures to relive pain and suffering. May use oxygen, suction and manual treatment of airway obstruction as needed for comfort.         Advance Directive Documentation   Flowsheet Row Most Recent Value  Type of Advance Directive  Out of facility DNR (pink MOST or yellow form)  Pre-existing out of facility DNR order (yellow form or pink MOST form)  No data  "MOST" Form in Place?  No data       Chief Complaint  Patient presents with  . Medical Management of Chronic Issues    HISTORY OF PRESENT ILLNESS:  This is a 89-YO female seen for a routine visit.  She is a long-term care resident  of [facility].  She is being followed-up by hospice. She was seen in her room today. She was reported to constipated. She had a fever of 101.1 F last night. She was recently started on Avelox X 10 days for HCAP. CXR showed worsening bilateral infiltrate and KUB showed stool in the colon is increased. She is verbally responsive, sleepy and was coughing productively.    PAST MEDICAL HISTORY:  Past Medical History:  Diagnosis Date  . Allergic rhinitis, cause unspecified   . Anemia   . Arthralgia of temporomandibular joint   . Arthropathy, unspecified, site unspecified   . Atrial fibrillation (HCC)   . Bronchitis, acute   . Carpal tunnel syndrome   . Chest pain, unspecified   . Chronic kidney disease, unspecified   . Closed dislocation, thoracic vertebra   . Closed fracture of lumbar vertebra without mention of spinal cord injury   . Diabetes type 2, uncontrolled (HCC)   . Diabetes with renal manifestations(250.4)   . Diastasis of muscle   . Eczema   . Gait disorder   . Hematuria, unspecified   . Hemoptysis   . History of nuclear stress test 06/06/2010   lexiscan;  normal pattern of perfusion; low risk   . Hyperlipidemia   . Hyperpotassemia   . Hypertension   . Hypothyroid   . Insomnia, unspecified   . Macular degeneration (senile) of retina, unspecified   . Mild memory disturbance   . Myoclonus   . Obesity, unspecified   . Open wound of knee, leg (except thigh), and ankle, without mention of complication   . Osteoporosis   . Other atopic dermatitis and related conditions   . Other disorder of calcium metabolism   . Other specified idiopathic peripheral neuropathy   . Personal history of fall   . Personality change due to conditions classified elsewhere   . Scoliosis   . Seborrheic keratosis   . Trigger finger (acquired)   . Type II or unspecified type diabetes mellitus without mention of complication, not stated as uncontrolled   . Unspecified closed  fracture of pelvis   . Unspecified constipation   . Unspecified glaucoma(365.9)   . Unspecified hereditary and idiopathic peripheral neuropathy   . Unspecified hypertrophic and atrophic condition of skin   . Unspecified pruritic disorder   . Unspecified urinary incontinence   . Unspecified vitamin D deficiency   . Urinary tract infection, site not specified   . Varicose veins of lower extremities with inflammation      CURRENT MEDICATIONS: Reviewed  Patient's Medications  New Prescriptions   No medications on file  Previous Medications   ACETAMINOPHEN (TYLENOL) 650 MG CR TABLET    Take 650 mg by mouth every 6 (six) hours as needed for pain.   ACETAMINOPHEN (TYLENOL) 650 MG SUPPOSITORY    Place 650 mg rectally every 4 (four) hours.    AMIODARONE (PACERONE) 200 MG TABLET    Take one tablet by mouth once daily   ASPIRIN EC 81 MG TABLET    Take 1 tablet (81 mg total) by mouth daily.   BISACODYL (DULCOLAX) 10 MG SUPPOSITORY    Place 10 mg rectally every 3 (three) days.   CALCITONIN, SALMON, (MIACALCIN/FORTICAL) 200 UNIT/ACT NASAL SPRAY    INSTILL 1 SPRAY INTO ALTERNATING NOSTRILS ONCE A DAY FOR OSTEOPROSIS   CEFTRIAXONE (ROCEPHIN) 1 G INJECTION    Inject 1 g into the muscle daily. x10 days   DILTIAZEM (CARDIZEM CD) 120 MG 24 HR CAPSULE    Take 1 capsule (120 mg total) by mouth daily.   GUAIFENESIN (MUCINEX) 600 MG 12 HR TABLET    Take 600 mg by mouth 2 (two) times daily. x1 week scheduled and then PRN   IPRATROPIUM-ALBUTEROL (DUONEB) 0.5-2.5 (3) MG/3ML SOLN    Take 3 mLs by nebulization 3 (three) times daily. 6A, 2P, 10A x5 days   LACTULOSE (CHRONULAC) 10 GM/15ML SOLUTION    Take 20 g by mouth 2 (two) times daily.    LATANOPROST (XALATAN) 0.005 % OPHTHALMIC SOLUTION    Place 1 drop into the left eye at bedtime.   LEVOTHYROXINE (SYNTHROID, LEVOTHROID) 150 MCG TABLET    Take one tablet by mouth 30 minutes before breakfast for thyroid   MULTIPLE VITAMINS-MINERALS (DECUBI-VITE) CAPS    Take 1  capsule by mouth daily.   OMEPRAZOLE (PRILOSEC) 20 MG CAPSULE    Take 1 capsule (20 mg total) by mouth daily.   OXYGEN    Inhale 2 L into the lungs continuous.   PREGABALIN (LYRICA) 75 MG CAPSULE    Take 1 capsule (75 mg total) by mouth 2 (two) times daily.   SACCHAROMYCES BOULARDII (FLORASTOR) 250 MG CAPSULE  Take 250 mg by mouth 2 (two) times daily. x13 days   TORSEMIDE (DEMADEX) 10 MG TABLET    Take 1 tablet (10 mg total) by mouth daily.   TRAMADOL (ULTRAM) 50 MG TABLET    Take 0.5 tablets (25 mg total) by mouth every 6 (six) hours as needed for moderate pain.   TROSPIUM CHLORIDE 60 MG CP24    Take one capsule by mouth once daily for bladder control  Modified Medications   No medications on file  Discontinued Medications   DEXTROMETHORPHAN-GUAIFENESIN (MUCINEX DM) 30-600 MG PER 12 HR TABLET    Take 1 tablet by mouth every 12 (twelve) hours as needed for cough.      Allergies  Allergen Reactions  . Cosopt [Dorzolamide Hcl-Timolol Mal] Other (See Comments)    Redness and burning in both eyes  . Noroxin [Norfloxacin] Swelling  . Sulfa Antibiotics Nausea And Vomiting  . Prozac [Fluoxetine Hcl] Rash     REVIEW OF SYSTEMS:  GENERAL: no change in appetite, no fatigue, no weight changes, no fever, chills or weakness EYES: Denies change in vision, dry eyes, eye pain, itching or discharge EARS: Denies change in hearing, ringing in ears, or earache NOSE: Denies nasal congestion or epistaxis MOUTH and THROAT: Denies oral discomfort, gingival pain or bleeding, pain from teeth or hoarseness   RESPIRATORY: no cough, SOB, DOE, wheezing, hemoptysis CARDIAC: no chest pain, edema or palpitations GI: complains of abdominal pain, + constipation GU: Denies dysuria, frequency, hematuria, incontinence, or discharge PSYCHIATRIC: Denies feeling of depression or anxiety. No report of hallucinations, insomnia, paranoia, or agitation     PHYSICAL EXAMINATION  GENERAL APPEARANCE: Well nourished. In  no acute distress. Normal body habitus SKIN:  Skin is warm and dry. Left posterior heel wound is healed HEAD: Normal in size and contour. No evidence of trauma EYES: Lids open and close normally. No blepharitis, entropion or ectropion. PERRL. Conjunctivae are clear and sclerae are white. Lenses are without opacity EARS: Pinnae are normal. Hard of hearing MOUTH and THROAT: Lips are without lesions. Oral mucosa is moist and without lesions. Tongue is normal in shape, size, and color and without lesions NECK: supple, trachea midline, no neck masses, no thyroid tenderness, no thyromegaly LYMPHATICS: no LAN in the neck, no supraclavicular LAN RESPIRATORY: breathing is even & unlabored, BS CTAB CARDIAC: RRR, no murmur,no extra heart sounds, no edema GI: abdomen soft, normal BS, no masses, no hepatomegaly, no splenomegaly, enlarged abdomen EXTREMITIES:  Able to move 4 extremities; BLE generalized weakness PSYCHIATRIC: Alert to self, disoriented to time and  place. Affect and behavior are appropriate   LABS/RADIOLOGY: Labs reviewed:n Basic Metabolic Panel:  Recent Labs  40/98/1109/21/17 1636  11/28/15 0318 11/29/15 0330 12/01/15 0825  12/31/15 01/22/16 02/19/16  NA 140  < > 143 142 140  < > 145 136* 143  K 4.3  < > 4.0 3.6 3.5  < > 4.4 5.2 4.2  CL 113*  < > 114* 111 105  --   --   --   --   CO2 18*  < > 21* 23 24  --   --   --   --   GLUCOSE 133*  < > 193* 136* 198*  --   --   --   --   BUN 37*  < > 26* 25* 21*  < > 31* 43* 28*  CREATININE 2.10*  < > 1.94* 1.95* 1.92*  < > 1.7* 2.3* 1.7*  CALCIUM 9.3  < >  9.5 10.1 10.3  --   --   --   --   MG 1.7  --   --   --   --   --   --   --   --   < > = values in this interval not displayed. Liver Function Tests:  Recent Labs  11/13/15 0910 11/19/15 0654  11/26/15 1741 12/10/15 01/22/16 02/19/16  AST 20 21  < > 17 15 14  12*  ALT 13* 22  < > 13* 8 12 11   ALKPHOS 96 78  < > 79 92 157* 107  BILITOT 0.7 0.9  --  0.7  --   --   --   PROT 6.3* 6.2*  --   6.7  --   --   --   ALBUMIN 3.2* 2.7*  --  3.1*  --   --   --   < > = values in this interval not displayed.  CBC:  Recent Labs  11/28/15 0318 11/29/15 0330 12/01/15 0825  12/10/15 12/17/15 01/22/16 02/19/16  WBC 8.4 9.1 10.0  < > 11.2 10.6 9.6 8.1  NEUTROABS  --   --  6.7  < > 8 7  --  5  HGB 8.8* 9.0* 10.3*  < > 9.5* 10.1* 11.9* 10.3*  HCT 27.6* 28.5* 31.4*  < > 31* 33* 36 32*  MCV 99.3 98.6 93.5  --   --   --   --   --   PLT 287 316 347  < > 417* 315 293 392  < > = values in this interval not displayed. Lipid Panel:  Recent Labs  09/07/15 1520 01/22/16  HDL 38* 62   Cardiac Enzymes:  Recent Labs  11/18/15 1933 11/19/15 0105 11/19/15 0654  TROPONINI 0.48* 0.51* 0.49*   CBG:  Recent Labs  12/03/15 0750 12/03/15 1125 12/03/15 1641  GLUCAP 173* 223* 223*     ASSESSMENT/PLAN:  HCAP - discontinue Avelox, start Ceftriaxone 1 gm IM Q D X 10 days and Florastor 250 mg 1 capsule PO BID X 13 days; nasal swab for Influenza; start Duoneb 1 neb Q 6AM, 2PM and 10PM  Cough - start Mucinex 600 mg PO BID X 1 week the PRN  Chronic constipation - increase lactulose 10 g/15 mL give 20 g/30 mL by mouth BID  Chronic systolic CHF - no SOB; continue torsemide 10 mg 1 tab by mouth daily  Overactive bladder - continue trospium 60 mg 1 capsule by mouth daily  Osteoarthrosis - continue Tylenol arthritis ER 650 mg 1 tab by mouth every 6 hours when necessary and tramadol 50 mg 1/2 tab PO Q 6 hours PRN     Goals of care:  Short-term rehabilitation/Hospice   Lashae Wollenberg C.Medina-Vargas  -  NP [employer] [phone]

## 2016-04-24 LAB — TSH: TSH: 2.72 u[IU]/mL (ref 0.41–5.90)

## 2016-04-24 LAB — BASIC METABOLIC PANEL
BUN: 35 mg/dL — AB (ref 4–21)
Creatinine: 1.7 mg/dL — AB (ref 0.5–1.1)
GLUCOSE: 133 mg/dL
Potassium: 3.7 mmol/L (ref 3.4–5.3)
SODIUM: 144 mmol/L (ref 137–147)

## 2016-05-06 ENCOUNTER — Encounter: Payer: Self-pay | Admitting: Adult Health

## 2016-05-06 ENCOUNTER — Non-Acute Institutional Stay (SKILLED_NURSING_FACILITY): Payer: Medicare Other | Admitting: Adult Health

## 2016-05-06 DIAGNOSIS — E1122 Type 2 diabetes mellitus with diabetic chronic kidney disease: Secondary | ICD-10-CM | POA: Diagnosis not present

## 2016-05-06 DIAGNOSIS — H409 Unspecified glaucoma: Secondary | ICD-10-CM

## 2016-05-06 DIAGNOSIS — K2990 Gastroduodenitis, unspecified, without bleeding: Secondary | ICD-10-CM

## 2016-05-06 DIAGNOSIS — E118 Type 2 diabetes mellitus with unspecified complications: Secondary | ICD-10-CM | POA: Diagnosis not present

## 2016-05-06 DIAGNOSIS — N184 Chronic kidney disease, stage 4 (severe): Secondary | ICD-10-CM

## 2016-05-06 DIAGNOSIS — K2970 Gastritis, unspecified, without bleeding: Secondary | ICD-10-CM

## 2016-05-06 DIAGNOSIS — M792 Neuralgia and neuritis, unspecified: Secondary | ICD-10-CM

## 2016-05-06 DIAGNOSIS — R059 Cough, unspecified: Secondary | ICD-10-CM

## 2016-05-06 DIAGNOSIS — R05 Cough: Secondary | ICD-10-CM | POA: Diagnosis not present

## 2016-05-06 NOTE — Progress Notes (Signed)
DATE:  05/06/2016   MRN:  [id number]  BIRTHDAY: Jan 04, 1917  Facility:  Nursing Home Location:  [facility]  Nursing Home Room Number: [id number]  LEVEL OF CARE:  SNF [phone])  Contact Information    Name Relation Home Work [place] [phone]  [phone]   Jacquelynn, Friend [phone]     Iretha, Kirley [phone]         Code Status History    Date Active Date Inactive Code Status Order ID Comments User Context   11/26/2015  9:25 PM 12/03/2015  8:49 PM DNR [id number]  Hillary Bow, DO ED   11/13/2015  2:44 PM 11/21/2015  5:37 PM DNR [id number]  Meredith Pel, NP ED   11/13/2015 12:29 PM 11/13/2015  2:44 PM DNR [id number]  Simonne Martinet, NP ED   11/13/2015 12:00 PM 11/13/2015 12:29 PM DNR [id number]  Gerhard Munch, MD ED   10/07/2012  8:41 PM 10/10/2012  5:52 PM DNR [id number]  Jerald Kief, MD ED   08/14/2012  3:29 PM 08/19/2012  7:11 PM DNR [id number]  Laveda Norman, MD Inpatient    Questions for Most Recent Historical Code Status (Order [id number])    Question Answer Comment   In the event of cardiac or respiratory ARREST Do not call a "code blue"    In the event of cardiac or respiratory ARREST Do not perform Intubation, CPR, defibrillation or ACLS    In the event of cardiac or respiratory ARREST Use medication by any route, position, wound care, and other measures to relive pain and suffering. May use oxygen, suction and manual treatment of airway obstruction as needed for comfort.         Advance Directive Documentation   Flowsheet Row Most Recent Value  Type of Advance Directive  Out of facility DNR (pink MOST or yellow form)  Pre-existing out of facility DNR order (yellow form or pink MOST form)  No data  "MOST" Form in Place?  No data       Chief Complaint  Patient presents with  . Medical Management of Chronic Issues    HISTORY OF PRESENT ILLNESS:  This is a 89-YO female seen for a routine visit.  She is a long-term resident of  [facility]. She is followed-up by hospice/comfort care. She was seen in the room with son @ bedside.    PAST MEDICAL HISTORY:  Past Medical History:  Diagnosis Date  . Allergic rhinitis, cause unspecified   . Anemia   . Arthralgia of temporomandibular joint   . Arthropathy, unspecified, site unspecified   . Atrial fibrillation (HCC)   . Bronchitis, acute   . Carpal tunnel syndrome   . Chest pain, unspecified   . Chronic kidney disease, unspecified   . Closed dislocation, thoracic vertebra   . Closed fracture of lumbar vertebra without mention of spinal cord injury   . Diabetes type 2, uncontrolled (HCC)   . Diabetes with renal manifestations(250.4)   . Diastasis of muscle   . Eczema   . Gait disorder   . Hematuria, unspecified   . Hemoptysis   . History of nuclear stress test 06/06/2010   lexiscan; normal pattern of perfusion; low risk   . Hyperlipidemia   . Hyperpotassemia   . Hypertension   . Hypothyroid   . Insomnia, unspecified   . Macular degeneration (senile) of retina, unspecified   . Mild memory disturbance   . Myoclonus   .  Obesity, unspecified   . Open wound of knee, leg (except thigh), and ankle, without mention of complication   . Osteoporosis   . Other atopic dermatitis and related conditions   . Other disorder of calcium metabolism   . Other specified idiopathic peripheral neuropathy   . Personal history of fall   . Personality change due to conditions classified elsewhere   . Scoliosis   . Seborrheic keratosis   . Trigger finger (acquired)   . Type II or unspecified type diabetes mellitus without mention of complication, not stated as uncontrolled   . Unspecified closed fracture of pelvis   . Unspecified constipation   . Unspecified glaucoma(365.9)   . Unspecified hereditary and idiopathic peripheral neuropathy   . Unspecified hypertrophic and atrophic condition of skin   . Unspecified pruritic disorder   . Unspecified urinary incontinence   .  Unspecified vitamin D deficiency   . Urinary tract infection, site not specified   . Varicose veins of lower extremities with inflammation      CURRENT MEDICATIONS: Reviewed  Patient's Medications  New Prescriptions   No medications on file  Previous Medications   ACETAMINOPHEN (TYLENOL) 650 MG CR TABLET    Take 650 mg by mouth every 6 (six) hours as needed for pain.   ASPIRIN EC 81 MG TABLET    Take 1 tablet (81 mg total) by mouth daily.   BISACODYL (DULCOLAX) 10 MG SUPPOSITORY    Place 10 mg rectally every 3 (three) days.   CALCITONIN, SALMON, (MIACALCIN/FORTICAL) 200 UNIT/ACT NASAL SPRAY    INSTILL 1 SPRAY INTO ALTERNATING NOSTRILS ONCE A DAY FOR OSTEOPROSIS   DILTIAZEM (CARDIZEM CD) 120 MG 24 HR CAPSULE    Take 1 capsule (120 mg total) by mouth daily.   IPRATROPIUM-ALBUTEROL (DUONEB) 0.5-2.5 (3) MG/3ML SOLN    Take 3 mLs by nebulization every 4 (four) hours as needed (Dyspnea).    LACTULOSE (CHRONULAC) 10 GM/15ML SOLUTION    Take 30 g by mouth 2 (two) times daily.    LATANOPROST (XALATAN) 0.005 % OPHTHALMIC SOLUTION    Place 1 drop into the left eye at bedtime.   LEVOTHYROXINE (SYNTHROID, LEVOTHROID) 150 MCG TABLET    Take one tablet by mouth 30 minutes before breakfast for thyroid   MULTIPLE VITAMINS-MINERALS (DECUBI-VITE) CAPS    Take 1 capsule by mouth daily.   OMEPRAZOLE (PRILOSEC) 20 MG CAPSULE    Take 1 capsule (20 mg total) by mouth daily.   OXYGEN    Inhale 2 L into the lungs continuous.   PREGABALIN (LYRICA) 75 MG CAPSULE    Take 1 capsule (75 mg total) by mouth 2 (two) times daily.   TORSEMIDE (DEMADEX) 10 MG TABLET    Take 1 tablet (10 mg total) by mouth daily.   TRAMADOL (ULTRAM) 50 MG TABLET    Take 0.5 tablets (25 mg total) by mouth every 6 (six) hours as needed for moderate pain.   TROSPIUM CHLORIDE 60 MG CP24    Take one capsule by mouth once daily for bladder control  Modified Medications   No medications on file  Discontinued Medications   ACETAMINOPHEN (TYLENOL)  650 MG SUPPOSITORY    Place 650 mg rectally every 4 (four) hours.    AMIODARONE (PACERONE) 200 MG TABLET    Take one tablet by mouth once daily   CEFTRIAXONE (ROCEPHIN) 1 G INJECTION    Inject 1 g into the muscle daily. x10 days   GUAIFENESIN (MUCINEX) 600 MG 12 HR TABLET  Take 600 mg by mouth 2 (two) times daily. x1 week scheduled and then PRN   SACCHAROMYCES BOULARDII (FLORASTOR) 250 MG CAPSULE    Take 250 mg by mouth 2 (two) times daily. x13 days     Allergies  Allergen Reactions  . Cosopt [Dorzolamide Hcl-Timolol Mal] Other (See Comments)    Redness and burning in both eyes  . Noroxin [Norfloxacin] Swelling  . Sulfa Antibiotics Nausea And Vomiting  . Prozac [Fluoxetine Hcl] Rash     REVIEW OF SYSTEMS:  GENERAL: no change in appetite, no fatigue, no weight changes, no fever, chills or weakness EYES: Denies change in vision, dry eyes, eye pain, itching or discharge EARS: Denies change in hearing, ringing in ears, or earache NOSE: Denies nasal congestion or epistaxis MOUTH and THROAT: Denies oral discomfort, gingival pain or bleeding, pain from teeth or hoarseness   RESPIRATORY: no SOB, DOE, wheezing, hemoptysis, +cough CARDIAC: no chest pain, edema or palpitations GI: no abdominal pain, diarrhea, constipation, heart burn, nausea or vomiting GU: Denies dysuria, frequency, hematuria, incontinence, or discharge PSYCHIATRIC: Denies feeling of depression or anxiety. No report of hallucinations, insomnia, paranoia, or agitation    PHYSICAL EXAMINATION  GENERAL APPEARANCE: Well nourished. In no acute distress.  SKIN:  Skin is warm and dry.  HEAD: Normal in size and contour. No evidence of trauma EYES: Lids open and close normally. No blepharitis, entropion or ectropion. PERRL. Conjunctivae are clear and sclerae are white. Lenses are without opacity EARS: Pinnae are normal. Patient hears normal voice tunes of the examiner MOUTH and THROAT: Lips are without lesions. Oral mucosa is  moist and without lesions. Tongue is normal in shape, size, and color and without lesions NECK: supple, trachea midline, no neck masses, no thyroid tenderness, no thyromegaly LYMPHATICS: no LAN in the neck, no supraclavicular LAN RESPIRATORY: breathing is even & unlabored, BS CTAB CARDIAC: RRR, no murmur,no extra heart sounds, no edema GI: abdomen soft, normal BS, no masses, no tenderness, no hepatomegaly, no splenomegaly EXTREMITIES:  Able to move X 4 extremities; BLE has generalized weakness, has bilateral prevalon boots NEUROLOGICAL: There is no tremor. Speech is clear PSYCHIATRIC: Alert to person, disoriented to time and place. Affect and behavior are appropriate    LABS/RADIOLOGY: Labs reviewed: Basic Metabolic Panel:  Recent Labs  96/04/54 1636  11/28/15 0318 11/29/15 0330 12/01/15 0825  01/22/16 02/19/16 04/24/16  NA 140  < > 143 142 140  < > 136* 143 144  K 4.3  < > 4.0 3.6 3.5  < > 5.2 4.2 3.7  CL 113*  < > 114* 111 105  --   --   --   --   CO2 18*  < > 21* 23 24  --   --   --   --   GLUCOSE 133*  < > 193* 136* 198*  --   --   --   --   BUN 37*  < > 26* 25* 21*  < > 43* 28* 35*  CREATININE 2.10*  < > 1.94* 1.95* 1.92*  < > 2.3* 1.7* 1.7*  CALCIUM 9.3  < > 9.5 10.1 10.3  --   --   --   --   MG 1.7  --   --   --   --   --   --   --   --   < > = values in this interval not displayed. Liver Function Tests:  Recent Labs  11/13/15 0910 11/19/15 0654  11/26/15  1741 12/10/15 01/22/16 02/19/16  AST 20 21  < > 17 15 14  12*  ALT 13* 22  < > 13* 8 12 11   ALKPHOS 96 78  < > 79 92 157* 107  BILITOT 0.7 0.9  --  0.7  --   --   --   PROT 6.3* 6.2*  --  6.7  --   --   --   ALBUMIN 3.2* 2.7*  --  3.1*  --   --   --   < > = values in this interval not displayed. CBC:  Recent Labs  11/28/15 0318 11/29/15 0330 12/01/15 0825  12/10/15 12/17/15 01/22/16 02/19/16  WBC 8.4 9.1 10.0  < > 11.2 10.6 9.6 8.1  NEUTROABS  --   --  6.7  < > 8 7  --  5  HGB 8.8* 9.0* 10.3*  < > 9.5*  10.1* 11.9* 10.3*  HCT 27.6* 28.5* 31.4*  < > 31* 33* 36 32*  MCV 99.3 98.6 93.5  --   --   --   --   --   PLT 287 316 347  < > 417* 315 293 392  < > = values in this interval not displayed. Lipid Panel:  Recent Labs  09/07/15 1520 01/22/16  HDL 38* 62   Cardiac Enzymes:  Recent Labs  11/18/15 1933 11/19/15 0105 11/19/15 0654  TROPONINI 0.48* 0.51* 0.49*   CBG:  Recent Labs  12/03/15 0750 12/03/15 1125 12/03/15 1641  GLUCAP 173* 223* 223*     ASSESSMENT/PLAN:  Chronic kidney disease, stge IV - GFR 30.35, stable Lab Results  Component Value Date   CREATININE 1.7 (A) 04/24/2016  ' Diabetes mellitus, type II - diet-controlled; check HgbA1c  Gastritis - continue omeprazole 20 mg 1 capsule by mouth daily  Cough - discontinue Mucinex when necessary and start Mucinex 600 mg 1 tab by mouth every 12 hours 2 weeks  Neuropathic pain - stable; continue Lyrica 75 mg 1 capsule by mouth twice a day  Glaucoma, left eye - no complaints of eye pain; continue Xalatan 0.005% 1 drop into left eye daily at bedtime     Goals of care:  Long-term care, hospice    Reighlynn Swiney C. Medina-Vargas - NP    [employer] [phone]

## 2016-05-07 LAB — HEMOGLOBIN A1C: HEMOGLOBIN A1C: 6.4

## 2016-05-22 LAB — CBC AND DIFFERENTIAL
HCT: 35 % — AB (ref 36–46)
HEMOGLOBIN: 10.7 g/dL — AB (ref 12.0–16.0)
Platelets: 354 10*3/uL (ref 150–399)
WBC: 9.7 10^3/mL

## 2016-05-22 LAB — BASIC METABOLIC PANEL
BUN: 37 mg/dL — AB (ref 4–21)
Creatinine: 1.8 mg/dL — AB (ref 0.5–1.1)
GLUCOSE: 208 mg/dL
Potassium: 3.9 mmol/L (ref 3.4–5.3)
Sodium: 143 mmol/L (ref 137–147)

## 2016-05-26 LAB — BASIC METABOLIC PANEL
BUN: 46 mg/dL — AB (ref 4–21)
Creatinine: 1.9 mg/dL — AB (ref 0.5–1.1)
GLUCOSE: 108 mg/dL
Potassium: 4.1 mmol/L (ref 3.4–5.3)
SODIUM: 144 mmol/L (ref 137–147)

## 2016-06-03 ENCOUNTER — Non-Acute Institutional Stay (SKILLED_NURSING_FACILITY): Payer: Medicare Other | Admitting: Internal Medicine

## 2016-06-03 ENCOUNTER — Encounter: Payer: Self-pay | Admitting: Internal Medicine

## 2016-06-03 DIAGNOSIS — E039 Hypothyroidism, unspecified: Secondary | ICD-10-CM

## 2016-06-03 DIAGNOSIS — I480 Paroxysmal atrial fibrillation: Secondary | ICD-10-CM | POA: Diagnosis not present

## 2016-06-03 DIAGNOSIS — K2990 Gastroduodenitis, unspecified, without bleeding: Secondary | ICD-10-CM

## 2016-06-03 DIAGNOSIS — K2970 Gastritis, unspecified, without bleeding: Secondary | ICD-10-CM | POA: Diagnosis not present

## 2016-06-03 NOTE — Progress Notes (Signed)
LOCATION: [facility]  PCP: Murray Hodgkins, MD   Code Status: DNR  Goals of care: Advanced Directive information Advanced Directives 05/06/2016  Does Patient Have a Medical Advance Directive? Yes  Type of Advance Directive Out of facility DNR (pink MOST or yellow form)  Does patient want to make changes to medical advance directive? No - Patient declined  Copy of Healthcare Power of Attorney in Chart? -  Pre-existing out of facility DNR order (yellow form or pink MOST form) -       Extended Emergency Contact Information Primary Emergency Contact: Tonita Phoenix Address: [address] [place], [place] [address] [place] Home Phone: [phone] Mobile Phone: [phone] Relation: Son Secondary Emergency Contact: Susy Manor Address: [address]          [place], [place] [address] [place] Home Phone: [phone] Relation: Son   Allergies  Allergen Reactions  . Cosopt [Dorzolamide Hcl-Timolol Mal] Other (See Comments)    Redness and burning in both eyes  . Noroxin [Norfloxacin] Swelling  . Sulfa Antibiotics Nausea And Vomiting  . Prozac [Fluoxetine Hcl] Rash    Chief Complaint  Patient presents with  . Medical Management of Chronic Issues    Routine Visit      HPI:  Patient is a 83 y.o. female seen today for routine visit. She is under hospice service. She appears comfortable. She denies pain. She has poor po intake per nursing. She feeds herself. No acute behavior changes reported.   Review of Systems:  Constitutional: Negative for fever, chills.  HENT: Negative for headache, congestion, nasal discharge. Eyes: Negative for double vision and discharge. Wears glasses.   Respiratory: Negative for cough, wheezing, SOB. On prn oxygen.  Cardiovascular: Negative for chest pain, palpitation.  Gastrointestinal: Negative for heartburn, nausea, vomiting, abdominal pain. Genitourinary: Negative for dysuria.    Musculoskeletal: Negative for fall in the facility.  Skin: Negative for itching, rash.  Neurological: Negative for dizziness.   Past Medical History:  Diagnosis Date  . Allergic rhinitis, cause unspecified   . Anemia   . Arthralgia of temporomandibular joint   . Arthropathy, unspecified, site unspecified   . Atrial fibrillation (HCC)   . Bronchitis, acute   . Carpal tunnel syndrome   . Chest pain, unspecified   . Chronic kidney disease, unspecified   . Closed dislocation, thoracic vertebra   . Closed fracture of lumbar vertebra without mention of spinal cord injury   . Diabetes type 2, uncontrolled (HCC)   . Diabetes with renal manifestations(250.4)   . Diastasis of muscle   . Eczema   . Gait disorder   . Hematuria, unspecified   . Hemoptysis   . History of nuclear stress test 06/06/2010   lexiscan; normal pattern of perfusion; low risk   . Hyperlipidemia   . Hyperpotassemia   . Hypertension   . Hypothyroid   . Insomnia, unspecified   . Macular degeneration (senile) of retina, unspecified   . Mild memory disturbance   . Myoclonus   . Obesity, unspecified   . Open wound of knee, leg (except thigh), and ankle, without mention of complication   . Osteoporosis   . Other atopic dermatitis and related conditions   . Other disorder of calcium metabolism   . Other specified idiopathic peripheral neuropathy   . Personal history of fall   . Personality change due to conditions classified elsewhere   . Scoliosis   .  Seborrheic keratosis   . Trigger finger (acquired)   . Type II or unspecified type diabetes mellitus without mention of complication, not stated as uncontrolled   . Unspecified closed fracture of pelvis   . Unspecified constipation   . Unspecified glaucoma(365.9)   . Unspecified hereditary and idiopathic peripheral neuropathy   . Unspecified hypertrophic and atrophic condition of skin   . Unspecified pruritic disorder   . Unspecified urinary incontinence   .  Unspecified vitamin D deficiency   . Urinary tract infection, site not specified   . Varicose veins of lower extremities with inflammation    Past Surgical History:  Procedure Laterality Date  . ABDOMINAL HYSTERECTOMY     1973  . BIOPSY BREAST     2000  . CORONARY ARTERY BYPASS GRAFT     Dr Tyrone Sage 04/1996  . ESOPHAGOGASTRODUODENOSCOPY N/A 08/17/2012   Procedure: ESOPHAGOGASTRODUODENOSCOPY (EGD);  Surgeon: Hilarie Fredrickson, MD;  Location: [facility];  Service: Endoscopy;  Laterality: N/A;  . HOT HEMOSTASIS N/A 08/17/2012   Procedure: HOT HEMOSTASIS (ARGON PLASMA COAGULATION/BICAP);  Surgeon: Hilarie Fredrickson, MD;  Location: [facility];  Service: Endoscopy;  Laterality: N/A;  . KNEE ARTHROSCOPY     left Dr Thurston Hole   . LASER LAPAROSCOPY     right eye 04/2005  . LUMBAR SPINE SURGERY     Dr Newell Coral   . RIGHT OOPHORECTOMY     1950   Social History:   reports that she has never smoked. She has never used smokeless tobacco. She reports that she drinks alcohol. She reports that she does not use drugs.  Family History  Problem Relation Age of Onset  . Diabetes Mother   . Cancer Sister     colon  . Alzheimer's disease Sister   . Prostate cancer Son     Medications: Allergies as of 06/03/2016      Reactions   Cosopt [dorzolamide Hcl-timolol Mal] Other (See Comments)   Redness and burning in both eyes   Noroxin [norfloxacin] Swelling   Sulfa Antibiotics Nausea And Vomiting   Prozac [fluoxetine Hcl] Rash      Medication List       Accurate as of 06/03/16  2:54 PM. Always use your most recent med list.          acetaminophen 650 MG CR tablet Commonly known as:  TYLENOL Take 650 mg by mouth every 6 (six) hours as needed for pain.   aspirin EC 81 MG tablet Take 1 tablet (81 mg total) by mouth daily.   bisacodyl 10 MG suppository Commonly known as:  DULCOLAX Place 10 mg rectally every 3 (three) days.   calcitonin (salmon) 200 UNIT/ACT nasal spray Commonly known as:   MIACALCIN/FORTICAL INSTILL 1 SPRAY INTO ALTERNATING NOSTRILS ONCE A DAY FOR OSTEOPROSIS   DECUBI-VITE Caps Take 1 capsule by mouth daily.   diltiazem 120 MG 24 hr capsule Commonly known as:  CARDIZEM CD Take 1 capsule (120 mg total) by mouth daily.   ipratropium-albuterol 0.5-2.5 (3) MG/3ML Soln Commonly known as:  DUONEB Take 3 mLs by nebulization every 4 (four) hours as needed (Dyspnea).   lactulose 10 GM/15ML solution Commonly known as:  CHRONULAC Take 30 g by mouth 3 (three) times daily.   latanoprost 0.005 % ophthalmic solution Commonly known as:  XALATAN Place 1 drop into the left eye at bedtime.   levothyroxine 150 MCG tablet Commonly known as:  SYNTHROID, LEVOTHROID Take one tablet by mouth 30 minutes before breakfast for thyroid  omeprazole 20 MG capsule Commonly known as:  PRILOSEC Take 1 capsule (20 mg total) by mouth daily.   ondansetron 4 MG tablet Commonly known as:  ZOFRAN Take 4 mg by mouth every 8 (eight) hours as needed for nausea or vomiting.   OXYGEN Inhale 2 L into the lungs continuous.   pregabalin 75 MG capsule Commonly known as:  LYRICA Take 1 capsule (75 mg total) by mouth 2 (two) times daily.   prochlorperazine 5 MG tablet Commonly known as:  COMPAZINE Take 5 mg by mouth 3 (three) times daily.   torsemide 10 MG tablet Commonly known as:  DEMADEX Take 1 tablet (10 mg total) by mouth daily.   traMADol 50 MG tablet Commonly known as:  ULTRAM Take 0.5 tablets (25 mg total) by mouth every 6 (six) hours as needed for moderate pain.   Trospium Chloride 60 MG Cp24 Take one capsule by mouth once daily for bladder control       Immunizations: Immunization History  Administered Date(s) Administered  . Influenza Whole 11/26/2010, 11/27/2011  . Influenza, High Dose Seasonal PF 11/30/2013  . Influenza,inj,Quad PF,36+ Mos 10/31/2015  . Influenza-Unspecified 11/19/2012, 11/30/2013, 11/27/2014, 11/27/2014  . PPD Test 11/21/2015, 12/03/2015    . Pneumococcal Conjugate-13 03/04/2011  . Pneumococcal Polysaccharide-23 10/31/2015  . Tdap 03/04/2011  . Zoster 07/14/2005     Physical Exam: Vitals:   06/03/16 1441  BP: (!) 105/53  Pulse: 61  Resp: 18  Temp: 97.3 F (36.3 C)  TempSrc: Oral  SpO2: 95%  Weight: 137 lb 9.6 oz (62.4 kg)  Height:  (1.702 m)   Body mass index is 21.55 kg/m.   General- elderly female, frail, chronically ill appearing, in no acute distress Head- normocephalic, atraumatic Throat- moist mucus membrane  Eyes- PERRLA, EOMI, no pallor, no icterus, no discharge Neck- no cervical lymphadenopathy Cardiovascular- normal s1,s2, no murmur, trace leg edema Respiratory- poor air movement, no wheeze, no rhonchi, no crackles, on oxygen 2 L by nasal cannula Abdomen- bowel sounds present, soft, non tender, distended, no guarding or rigidity Musculoskeletal- able to move all 4 extremities Neurological- alert and oriented to self Skin- warm and dry Psychiatry- normal mood and affect    Labs reviewed: Basic Metabolic Panel:  Recent Labs  16/10/96 1636  11/28/15 0318 11/29/15 0330 12/01/15 0825  02/19/16 04/24/16 05/22/16  NA 140  < > 143 142 140  < > 143 144 143  K 4.3  < > 4.0 3.6 3.5  < > 4.2 3.7 3.9  CL 113*  < > 114* 111 105  --   --   --   --   CO2 18*  < > 21* 23 24  --   --   --   --   GLUCOSE 133*  < > 193* 136* 198*  --   --   --   --   BUN 37*  < > 26* 25* 21*  < > 28* 35* 37*  CREATININE 2.10*  < > 1.94* 1.95* 1.92*  < > 1.7* 1.7* 1.8*  CALCIUM 9.3  < > 9.5 10.1 10.3  --   --   --   --   MG 1.7  --   --   --   --   --   --   --   --   < > = values in this interval not displayed. Liver Function Tests:  Recent Labs  11/13/15 0910 11/19/15 0654  11/26/15 1741 12/10/15 01/22/16 02/19/16  AST 20 21  < > 12*  ALT 13* 22  < > 13* ALKPHOS 96 78  < > 79 92 157* 107  BILITOT 0.7 0.9  --  0.7  --   --   --   PROT 6.3* 6.2*  --  6.7  --   --   --   ALBUMIN 3.2*  2.7*  --  3.1*  --   --   --   < > = values in this interval not displayed. No results for input(s): LIPASE, AMYLASE in the last 8760 hours. No results for input(s): AMMONIA in the last 8760 hours. CBC:  Recent Labs  11/28/15 0318 11/29/15 0330 12/01/15 0825  12/10/15 12/17/15 01/22/16 02/19/16 05/22/16  WBC 8.4 9.1 10.0  < > 11.2 10.6 9.6 8.1 9.7  NEUTROABS  --   --  6.7  < > 8 7  --  5  --   HGB 8.8* 9.0* 10.3*  < > 9.5* 10.1* 11.9* 10.3* 10.7*  HCT 27.6* 28.5* 31.4*  < > 31* 33* 36 32* 35*  MCV 99.3 98.6 93.5  --   --   --   --   --   --   PLT 287 316 347  < > 417* 315 293 392 354  < > = values in this interval not displayed. Cardiac Enzymes:  Recent Labs  11/18/15 1933 11/19/15 0105 11/19/15 0654  TROPONINI 0.48* 0.51* 0.49*   BNP: Invalid input(s): POCBNP CBG:  Recent Labs  12/03/15 0750 12/03/15 1125 12/03/15 1641  GLUCAP 173* 223* 223*     Assessment/Plan  chronic gastroduodenitis Stable. Continue omeprazole 20 mg daily.  hypothyroidism Continue Synthroid 150 g daily. Lab Results  Component Value Date   TSH 2.72 04/24/2016   A. fib Heart rate is controlled at present. Continue aspirin 81 mg daily and diltiazem 120 mg daily.   Family/ staff Communication: reviewed care plan with patient, hospice nurse and nursing supervisor    Oneal Grout, MD Internal Medicine [facility] [address] [place], [place] [address] Cell Phone (Monday-Friday 8 am - 5 pm): [phone] On Call: [phone] and follow prompts after 5 pm and on weekends Office Phone: [phone] Office Fax: [phone]

## 2016-06-30 ENCOUNTER — Encounter: Payer: Self-pay | Admitting: Adult Health

## 2016-06-30 ENCOUNTER — Non-Acute Institutional Stay (SKILLED_NURSING_FACILITY): Payer: Medicare Other | Admitting: Adult Health

## 2016-06-30 DIAGNOSIS — E039 Hypothyroidism, unspecified: Secondary | ICD-10-CM

## 2016-06-30 DIAGNOSIS — N184 Chronic kidney disease, stage 4 (severe): Secondary | ICD-10-CM | POA: Diagnosis not present

## 2016-06-30 DIAGNOSIS — I5022 Chronic systolic (congestive) heart failure: Secondary | ICD-10-CM | POA: Diagnosis not present

## 2016-06-30 DIAGNOSIS — F0390 Unspecified dementia without behavioral disturbance: Secondary | ICD-10-CM | POA: Diagnosis not present

## 2016-06-30 DIAGNOSIS — M792 Neuralgia and neuritis, unspecified: Secondary | ICD-10-CM | POA: Diagnosis not present

## 2016-06-30 DIAGNOSIS — E1122 Type 2 diabetes mellitus with diabetic chronic kidney disease: Secondary | ICD-10-CM | POA: Diagnosis not present

## 2016-06-30 DIAGNOSIS — I4891 Unspecified atrial fibrillation: Secondary | ICD-10-CM | POA: Diagnosis not present

## 2016-06-30 NOTE — Progress Notes (Signed)
DATE:  06/30/2016   MRN:  [id number]  BIRTHDAY: 1916/05/18  Facility:  Nursing Home Location:  [facility]  Nursing Home Room Number: 902-P  LEVEL OF CARE:  SNF [phone])  Contact Information    Name Relation Home Work [place] [phone]  [phone]   [place], [place] [phone]     [place], [place] [phone]         Code Status History    Date Active Date Inactive Code Status Order ID Comments User Context   11/26/2015  9:25 PM 12/03/2015  8:49 PM DNR [id number]  Hillary Bow, DO ED   11/13/2015  2:44 PM 11/21/2015  5:37 PM DNR [id number]  Meredith Pel, NP ED   11/13/2015 12:29 PM 11/13/2015  2:44 PM DNR [id number]  Simonne Martinet, NP ED   11/13/2015 12:00 PM 11/13/2015 12:29 PM DNR [id number]  Gerhard Munch, MD ED   10/07/2012  8:41 PM 10/10/2012  5:52 PM DNR [id number]  Jerald Kief, MD ED   08/14/2012  3:29 PM 08/19/2012  7:11 PM DNR [id number]  Laveda Norman, MD Inpatient    Questions for Most Recent Historical Code Status (Order [id number])    Question Answer Comment   In the event of cardiac or respiratory ARREST Do not call a "code blue"    In the event of cardiac or respiratory ARREST Do not perform Intubation, CPR, defibrillation or ACLS    In the event of cardiac or respiratory ARREST Use medication by any route, position, wound care, and other measures to relive pain and suffering. May use oxygen, suction and manual treatment of airway obstruction as needed for comfort.        Chief Complaint  Patient presents with  . Medical Management of Chronic Issues    HISTORY OF PRESENT ILLNESS:  This is a 89-YO female seen for a routine visit.  She is a long-term care resident of [facility]. She is a comfort care/hospice patient. She was seen today in her room with son @ bedside. Son reported that patient ate 75% of her oatmeal when fed. Her oral intake has decreased according to staff. She was noted to be sleepy  but opens eyes upon verbal command by son. She was non-verbal but has good eye contact when opening her eyes.   PAST MEDICAL HISTORY:  Past Medical History:  Diagnosis Date  . Allergic rhinitis, cause unspecified   . Anemia   . Arthralgia of temporomandibular joint   . Arthropathy, unspecified, site unspecified   . Atrial fibrillation (HCC)   . Bronchitis, acute   . Carpal tunnel syndrome   . Chest pain, unspecified   . Chronic kidney disease, unspecified   . Closed dislocation, thoracic vertebra   . Closed fracture of lumbar vertebra without mention of spinal cord injury   . Diabetes type 2, uncontrolled (HCC)   . Diabetes with renal manifestations(250.4)   . Diastasis of muscle   . Eczema   . Gait disorder   . Hematuria, unspecified   . Hemoptysis   . History of nuclear stress test 06/06/2010   lexiscan; normal pattern of perfusion; low risk   . Hyperlipidemia   . Hyperpotassemia   . Hypertension   . Hypothyroid   . Insomnia, unspecified   . Macular degeneration (senile) of retina, unspecified   . Mild memory disturbance   . Myoclonus   . Obesity, unspecified   . Open wound of  knee, leg (except thigh), and ankle, without mention of complication   . Osteoporosis   . Other atopic dermatitis and related conditions   . Other disorder of calcium metabolism   . Other specified idiopathic peripheral neuropathy   . Personal history of fall   . Personality change due to conditions classified elsewhere   . Scoliosis   . Seborrheic keratosis   . Trigger finger (acquired)   . Type II or unspecified type diabetes mellitus without mention of complication, not stated as uncontrolled   . Unspecified closed fracture of pelvis   . Unspecified constipation   . Unspecified glaucoma(365.9)   . Unspecified hereditary and idiopathic peripheral neuropathy   . Unspecified hypertrophic and atrophic condition of skin   . Unspecified pruritic disorder   . Unspecified urinary incontinence     . Unspecified vitamin D deficiency   . Urinary tract infection, site not specified   . Varicose veins of lower extremities with inflammation      CURRENT MEDICATIONS: Reviewed  Patient's Medications  New Prescriptions   No medications on file  Previous Medications   ACETAMINOPHEN (TYLENOL) 650 MG CR TABLET    Take 650 mg by mouth every 6 (six) hours as needed for pain.   ASPIRIN EC 81 MG TABLET    Take 1 tablet (81 mg total) by mouth daily.   BISACODYL (DULCOLAX) 10 MG SUPPOSITORY    Place 10 mg rectally every 3 (three) days.   CALCITONIN, SALMON, (MIACALCIN/FORTICAL) 200 UNIT/ACT NASAL SPRAY    INSTILL 1 SPRAY INTO ALTERNATING NOSTRILS ONCE A DAY FOR OSTEOPROSIS   DILTIAZEM (CARDIZEM CD) 120 MG 24 HR CAPSULE    Take 1 capsule (120 mg total) by mouth daily.   IPRATROPIUM-ALBUTEROL (DUONEB) 0.5-2.5 (3) MG/3ML SOLN    Take 3 mLs by nebulization every 4 (four) hours as needed (Dyspnea).    LACTULOSE (CHRONULAC) 10 GM/15ML SOLUTION    Take 30 g by mouth 3 (three) times daily.    LATANOPROST (XALATAN) 0.005 % OPHTHALMIC SOLUTION    Place 1 drop into the left eye at bedtime.   LEVOTHYROXINE (SYNTHROID, LEVOTHROID) 150 MCG TABLET    Take one tablet by mouth 30 minutes before breakfast for thyroid   NUTRITIONAL SUPPLEMENT LIQD    Take 120 mLs by mouth 2 (two) times daily. [employer]   OMEPRAZOLE (PRILOSEC) 20 MG CAPSULE    Take 1 capsule (20 mg total) by mouth daily.   ONDANSETRON (ZOFRAN) 4 MG TABLET    Take 4 mg by mouth every 8 (eight) hours as needed for nausea or vomiting.   OXYGEN    Inhale 2 L into the lungs continuous.   PREGABALIN (LYRICA) 75 MG CAPSULE    Take 1 capsule (75 mg total) by mouth 2 (two) times daily.   TORSEMIDE (DEMADEX) 10 MG TABLET    Take 1 tablet (10 mg total) by mouth daily.   TRAMADOL (ULTRAM) 50 MG TABLET    Take 0.5 tablets (25 mg total) by mouth every 6 (six) hours as needed for moderate pain.   TROSPIUM CHLORIDE 60 MG CP24    Take one capsule by mouth once daily  for bladder control  Modified Medications   No medications on file  Discontinued Medications   MULTIPLE VITAMINS-MINERALS (DECUBI-VITE) CAPS    Take 1 capsule by mouth daily.   PROCHLORPERAZINE (COMPAZINE) 5 MG TABLET    Take 5 mg by mouth 3 (three) times daily.     Allergies  Allergen Reactions  .  Cosopt [Dorzolamide Hcl-Timolol Mal] Other (See Comments)    Redness and burning in both eyes  . Noroxin [Norfloxacin] Swelling  . Sulfa Antibiotics Nausea And Vomiting  . Prozac [Fluoxetine Hcl] Rash     REVIEW OF SYSTEMS:  Unable to obtain due to somnolence   PHYSICAL EXAMINATION  GENERAL APPEARANCE:  In no acute distress. Normal body habitus SKIN:  Skin is warm and dry.  HEAD: Normal in size and contour. No evidence of trauma EYES: Lids open and close normally. No blepharitis, entropion or ectropion. PERRL. Conjunctivae are clear and sclerae are white. Lenses are without opacity EARS: Pinnae are normal. Patient hears normal voice tunes of the examiner MOUTH and THROAT: Lips are without lesions. Oral mucosa is moist and without lesions. Tongue is normal in shape, size, and color and without lesions NECK: supple, trachea midline, no neck masses, no thyroid tenderness, no thyromegaly LYMPHATICS: no LAN in the neck, no supraclavicular LAN RESPIRATORY: breathing is even & unlabored, BS CTAB CARDIAC: RRR, no murmur,no extra heart sounds, no edema GI: abdomen soft, normal BS, no masses, no tenderness, no hepatomegaly, no splenomegaly EXTREMITIES:  Able to move X 4 extremities with generalized weakness  PSYCHIATRIC:  Affect and behavior are appropriate   LABS/RADIOLOGY: Labs reviewed: Basic Metabolic Panel:  Recent Labs  16/10/96 1636  11/28/15 0318 11/29/15 0330 12/01/15 0825  04/24/16 05/22/16 05/26/16  NA 140  < > 143 142 140  < > 144 143 144  K 4.3  < > 4.0 3.6 3.5  < > 3.7 3.9 4.1  CL 113*  < > 114* 111 105  --   --   --   --   CO2 18*  < > 21* 23 24  --   --   --   --    GLUCOSE 133*  < > 193* 136* 198*  --   --   --   --   BUN 37*  < > 26* 25* 21*  < > 35* 37* 46*  CREATININE 2.10*  < > 1.94* 1.95* 1.92*  < > 1.7* 1.8* 1.9*  CALCIUM 9.3  < > 9.5 10.1 10.3  --   --   --   --   MG 1.7  --   --   --   --   --   --   --   --   < > = values in this interval not displayed. Liver Function Tests:  Recent Labs  11/13/15 0910 11/19/15 0654  11/26/15 1741 12/10/15 01/22/16 02/19/16  AST 20 21  < > 17 15 14  12*  ALT 13* 22  < > 13* 8 12 11   ALKPHOS 96 78  < > 79 92 157* 107  BILITOT 0.7 0.9  --  0.7  --   --   --   PROT 6.3* 6.2*  --  6.7  --   --   --   ALBUMIN 3.2* 2.7*  --  3.1*  --   --   --   < > = values in this interval not displayed. CBC:  Recent Labs  11/28/15 0318 11/29/15 0330 12/01/15 0825  12/10/15 12/17/15 01/22/16 02/19/16 05/22/16  WBC 8.4 9.1 10.0  < > 11.2 10.6 9.6 8.1 9.7  NEUTROABS  --   --  6.7  < > 8 7  --  5  --   HGB 8.8* 9.0* 10.3*  < > 9.5* 10.1* 11.9* 10.3* 10.7*  HCT 27.6* 28.5* 31.4*  < >  31* 33* 36 32* 35*  MCV 99.3 98.6 93.5  --   --   --   --   --   --   PLT 287 316 347  < > 417* 315 293 392 354  < > = values in this interval not displayed. Lipid Panel:  Recent Labs  09/07/15 1520 01/22/16  HDL 38* 62   Cardiac Enzymes:  Recent Labs  11/18/15 1933 11/19/15 0105 11/19/15 0654  TROPONINI 0.48* 0.51* 0.49*   CBG:  Recent Labs  12/03/15 0750 12/03/15 1125 12/03/15 1641  GLUCAP 173* 223* 223*      ASSESSMENT/PLAN:  Dementia without behavioral disturbance - continue supportive care, comfort care  Chronic atrial fibrillation - rate-controlled; continue diltiazem 12 hour ER 120 mg by mouth daily  Chronic systolic CHF - continue torsemide 10 mg 1 tab by mouth daily  Hypothyroidism - continue Synthroid 150 mcg by mouth daily Lab Results  Component Value Date   TSH 2.72 04/24/2016   Chronic kidney disease, stage IV - has poor oral intake, comfort care Lab Results  Component Value Date    CREATININE 1.9 (A) 05/26/2016   Neuropathic pain - continue Lyrica 75 mg 1 capsule by mouth twice a day     Goals of care:  Long-term care, hospice/comfort care     Gaberiel Youngblood C. Medina-Vargas - NP    [employer] [phone]

## 2016-07-25 DEATH — deceased

## 2017-06-24 IMAGING — CT CT HEAD W/O CM
3 of 4 series · 16 of 47 positions shown, 19 images · non-contrast
Comparison: CT brain of 10/08/2012

CLINICAL DATA: Altered mental status, unresponsive

EXAM:
CT HEAD WITHOUT CONTRAST
TECHNIQUE: Contiguous axial images were obtained from the base of the skull
through the vertex without intravenous contrast.

[Series 201: head w/o, idose (1) · axial · non-contrast · 0.49mm/px · z∈[+70,+200]mm · 10 of 32 slices shown, 13 images]
[im 3/32  brain]
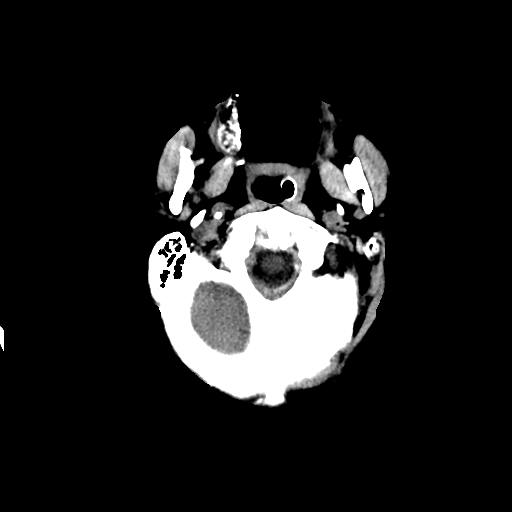
[im 3/32  bone]
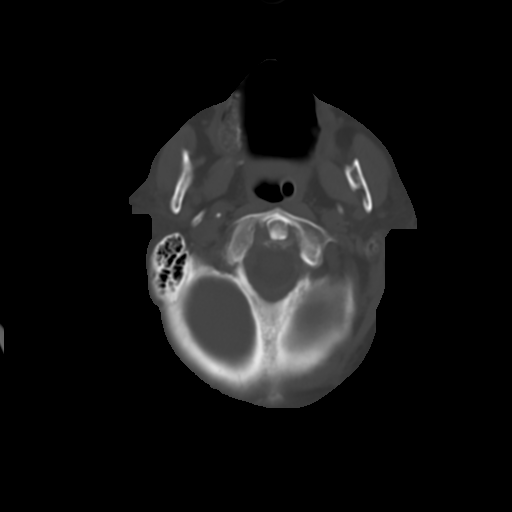
[im 5/32  brain]
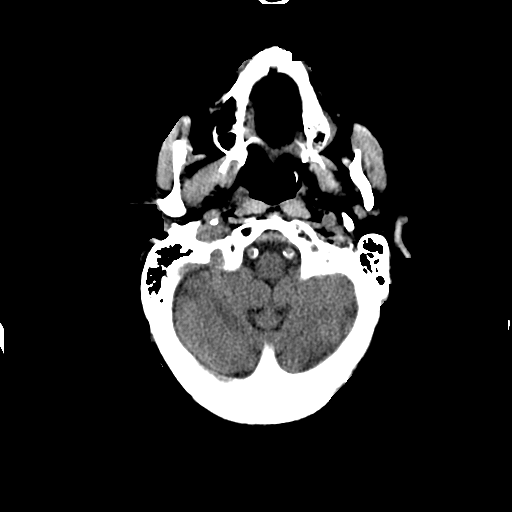
[im 9/32  brain]
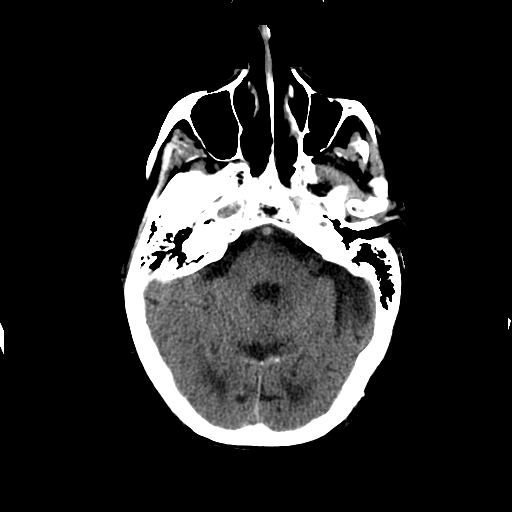
[im 12/32  brain]
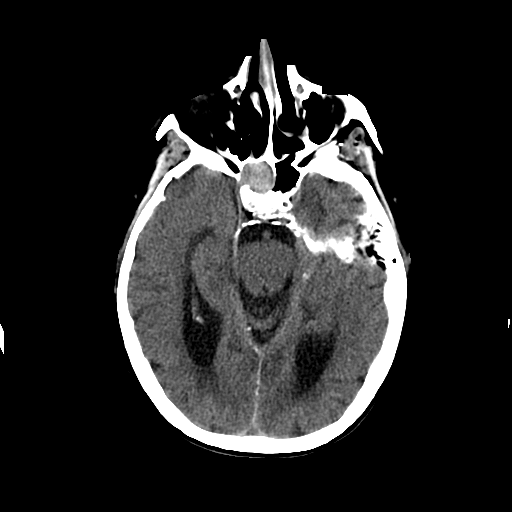
[im 14/32  brain]
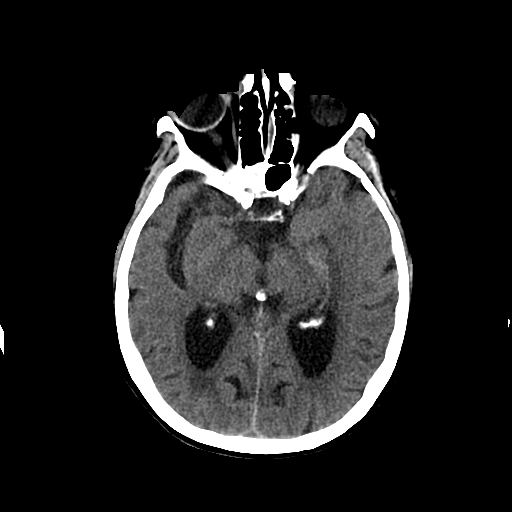
[im 14/32  bone]
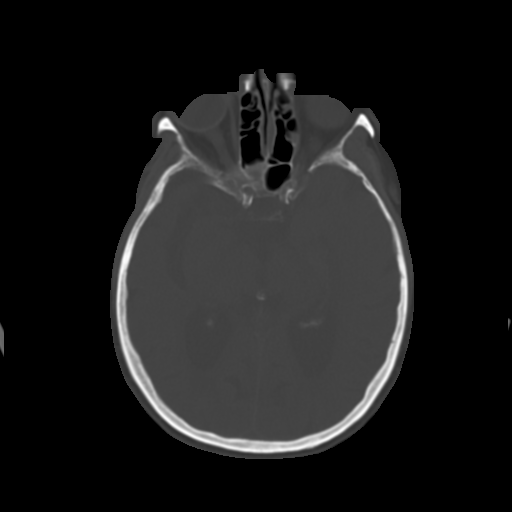
[im 18/32  brain]
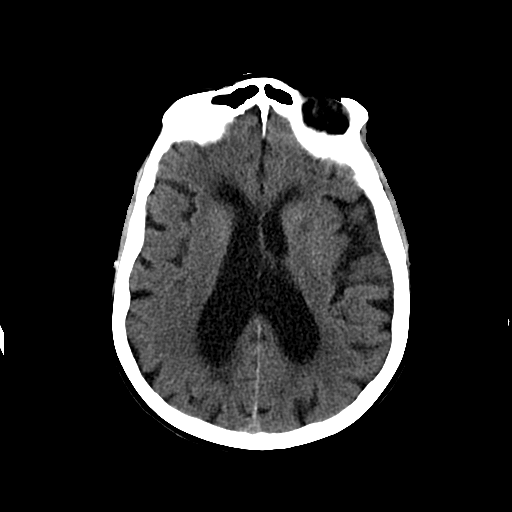
[im 20/32  brain]
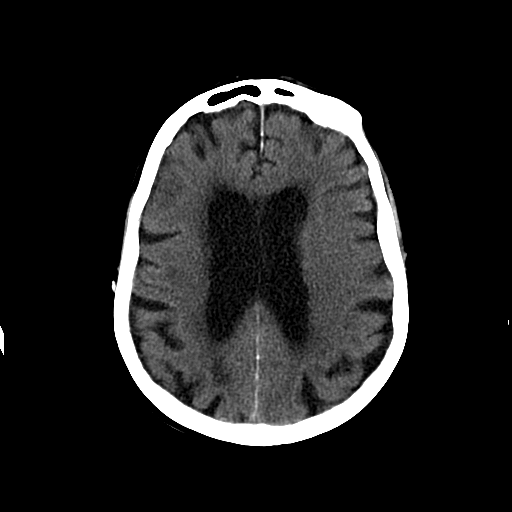
[im 23/32  brain]
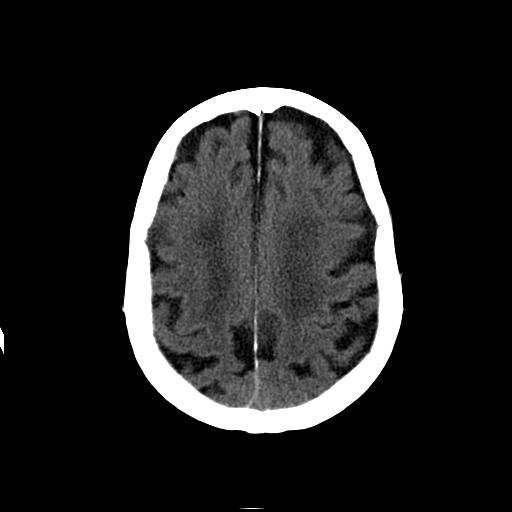
[im 27/32  brain]
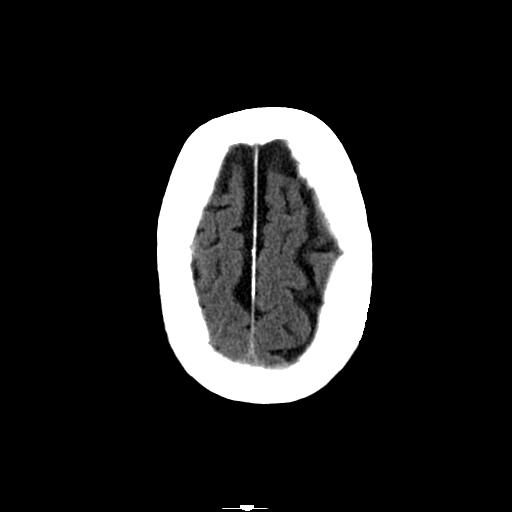
[im 27/32  bone]
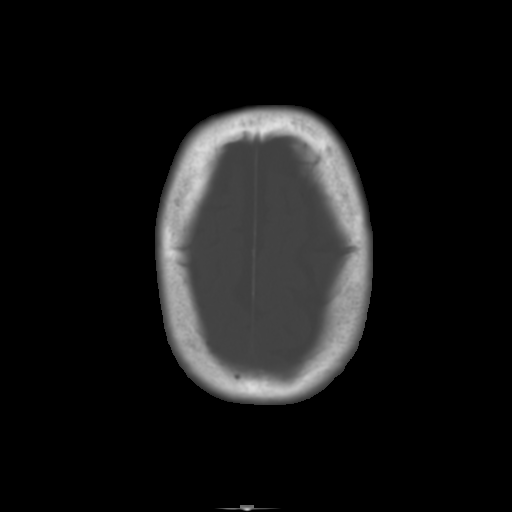
[im 29/32  brain]
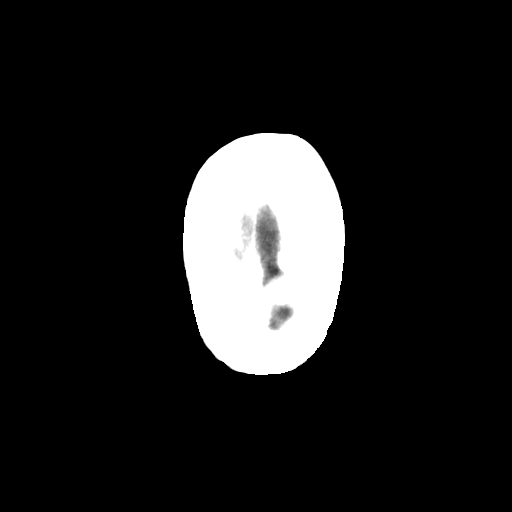

[Series 203: coronal st, idose (1) · coronal · 0.40mm/px · 3 of 83 slices shown]
[im 28/83  brain]
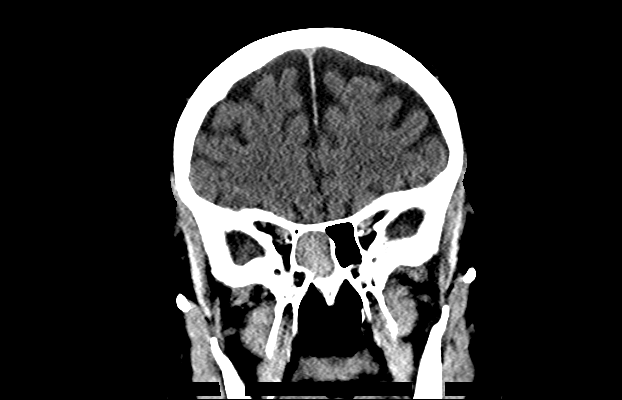
[im 37/83  brain]
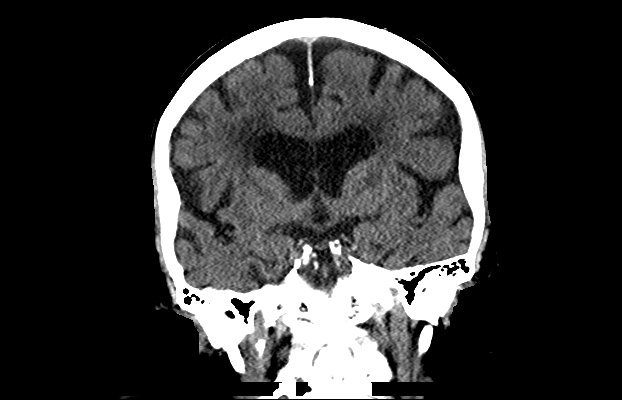
[im 46/83  brain]
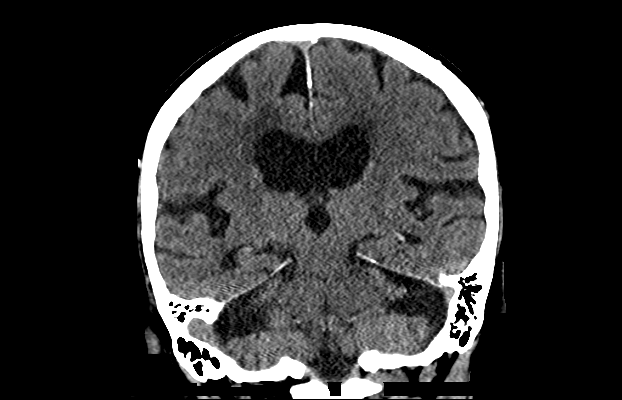

[Series 204: sagittal st, idose (1) · sagittal · 0.40mm/px · 3 of 83 slices shown]
[im 28/83  brain]
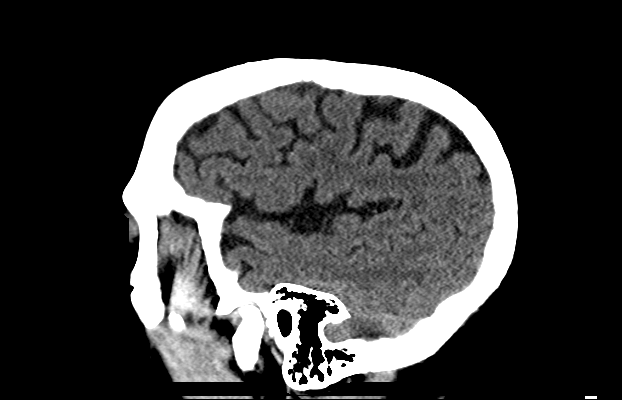
[im 42/83  brain]
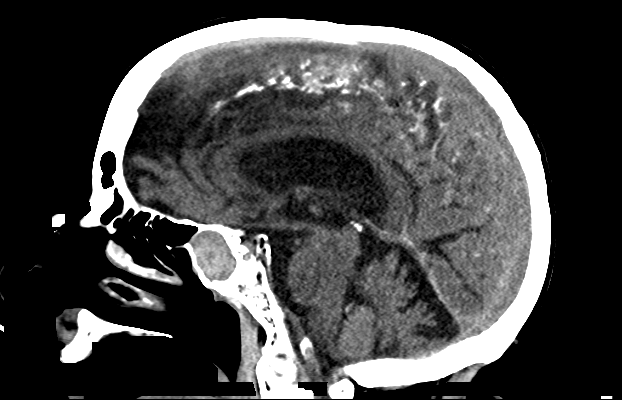
[im 55/83  brain]
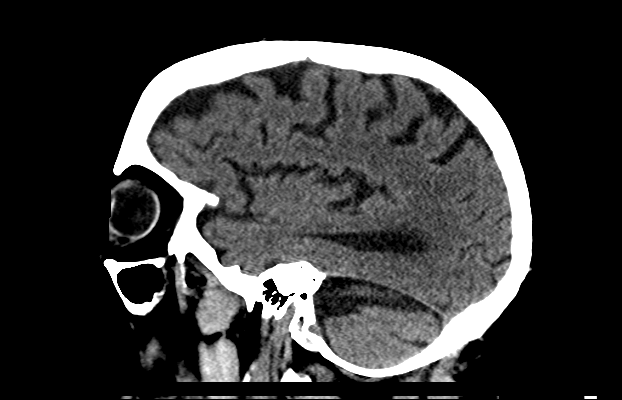

[16 of 47 positions shown; findings below may reference images not displayed]

FINDINGS: Brain: The ventricular system remains dilated, and there are
diffusely prominent cortical sulci, consistent with diffuse atrophy.
Moderate to severe small vessel ischemic change is again noted
throughout the periventricular white matter. There is
low-attenuation rounded structure within the anterior limb of the
internal capsule on the left which may represent acute or subacute
lacunar infarct. No hemorrhage, mass lesion, or other area of
infarction is seen.

Vascular: No vascular abnormality is seen on this unenhanced study.

Skull: No calvarial abnormality is seen.

Sinuses/Orbits: There is complete opacification with expansion of
the right partition of the sphenoid sinus which appears relatively
unchanged compared to the prior CT. This may be due to chronic
sinusitis but clinical correlation is recommended. This
opacification of the right sphenoid sinus measures 51 HU which could
be due to proteinaceous debris although a soft tissue lesion cannot
be excluded. If further assessment is warranted, MR would be
recommended.

Other: None
IMPRESSION: 1. Stable moderate atrophy and moderate to severe small vessel
ischemic change throughout the periventricular white matter.
2. Small low-attenuation structure in the anterior limb of the left
internal capsule not definitely seen previously. Consider acute or
subacute lacunar infarct.
3. Complete opacification of the right partition of the sphenoid
sinus may be due to sinusitis possibly chronic but soft tissue
lesion cannot be excluded as noted above.
